# Patient Record
Sex: Female | Born: 1965 | Race: Black or African American | Hispanic: No | Marital: Single | Smoking: Current every day smoker
Health system: Southern US, Community
[De-identification: ages and names within clinical notes are randomized; demographics above are authoritative.]

## PROBLEM LIST (undated history)

## (undated) DIAGNOSIS — E7800 Pure hypercholesterolemia, unspecified: Secondary | ICD-10-CM

## (undated) DIAGNOSIS — E119 Type 2 diabetes mellitus without complications: Secondary | ICD-10-CM

## (undated) DIAGNOSIS — G629 Polyneuropathy, unspecified: Secondary | ICD-10-CM

## (undated) DIAGNOSIS — N189 Chronic kidney disease, unspecified: Secondary | ICD-10-CM

## (undated) DIAGNOSIS — F4310 Post-traumatic stress disorder, unspecified: Secondary | ICD-10-CM

## (undated) DIAGNOSIS — Z915 Personal history of self-harm: Secondary | ICD-10-CM

## (undated) DIAGNOSIS — R0600 Dyspnea, unspecified: Secondary | ICD-10-CM

## (undated) DIAGNOSIS — Z9151 Personal history of suicidal behavior: Secondary | ICD-10-CM

## (undated) DIAGNOSIS — I10 Essential (primary) hypertension: Secondary | ICD-10-CM

---

## 2015-07-03 DIAGNOSIS — F32A Depression, unspecified: Secondary | ICD-10-CM | POA: Insufficient documentation

## 2015-07-05 DIAGNOSIS — Z765 Malingerer [conscious simulation]: Secondary | ICD-10-CM | POA: Insufficient documentation

## 2015-07-22 ENCOUNTER — Encounter (HOSPITAL_BASED_OUTPATIENT_CLINIC_OR_DEPARTMENT_OTHER): Payer: Self-pay | Admitting: *Deleted

## 2015-07-22 ENCOUNTER — Emergency Department (HOSPITAL_BASED_OUTPATIENT_CLINIC_OR_DEPARTMENT_OTHER)
Admission: EM | Admit: 2015-07-22 | Discharge: 2015-07-22 | Disposition: A | Discharge status: Home / Self Care | Payer: Self-pay | Attending: Emergency Medicine | Admitting: Emergency Medicine

## 2015-07-22 ENCOUNTER — Emergency Department (HOSPITAL_BASED_OUTPATIENT_CLINIC_OR_DEPARTMENT_OTHER): Payer: Self-pay

## 2015-07-22 DIAGNOSIS — R059 Cough, unspecified: Secondary | ICD-10-CM

## 2015-07-22 DIAGNOSIS — E119 Type 2 diabetes mellitus without complications: Secondary | ICD-10-CM | POA: Insufficient documentation

## 2015-07-22 DIAGNOSIS — Z87891 Personal history of nicotine dependence: Secondary | ICD-10-CM | POA: Insufficient documentation

## 2015-07-22 DIAGNOSIS — R05 Cough: Secondary | ICD-10-CM | POA: Insufficient documentation

## 2015-07-22 DIAGNOSIS — I10 Essential (primary) hypertension: Secondary | ICD-10-CM | POA: Insufficient documentation

## 2015-07-22 DIAGNOSIS — Z79899 Other long term (current) drug therapy: Secondary | ICD-10-CM | POA: Insufficient documentation

## 2015-07-22 DIAGNOSIS — J3489 Other specified disorders of nose and nasal sinuses: Secondary | ICD-10-CM | POA: Insufficient documentation

## 2015-07-22 HISTORY — DX: Type 2 diabetes mellitus without complications: E11.9

## 2015-07-22 HISTORY — DX: Essential (primary) hypertension: I10

## 2015-07-22 MED ORDER — PSEUDOEPHEDRINE HCL ER 120 MG PO TB12: 120.0000 mg | ORAL_TABLET | Freq: Two times a day (BID) | ORAL | Status: DC

## 2015-07-22 NOTE — Discharge Instructions (Signed)
Upper Respiratory Infection, Adult An upper respiratory infection (URI) is also sometimes known as the common cold. The upper respiratory tract includes the nose, sinuses, throat, trachea, and bronchi. Bronchi are the airways leading to the lungs. Most people improve within 1 week, but symptoms can last up to 2 weeks. A residual cough may last even longer.  CAUSES Many different viruses can infect the tissues lining the upper respiratory tract. The tissues become irritated and inflamed and often become very moist. Mucus production is also common. A cold is contagious. You can easily spread the virus to others by oral contact. This includes kissing, sharing a glass, coughing, or sneezing. Touching your mouth or nose and then touching a surface, which is then touched by another person, can also spread the virus. SYMPTOMS  Symptoms typically develop 1 to 3 days after you come in contact with a cold virus. Symptoms vary from person to person. They may include:  Runny nose.  Sneezing.  Nasal congestion.  Sinus irritation.  Sore throat.  Loss of voice (laryngitis).  Cough.  Fatigue.  Muscle aches.  Loss of appetite.  Headache.  Low-grade fever. DIAGNOSIS  You might diagnose your own cold based on familiar symptoms, since most people get a cold 2 to 3 times a year. Your caregiver can confirm this based on your exam. Most importantly, your caregiver can check that your symptoms are not due to another disease such as strep throat, sinusitis, pneumonia, asthma, or epiglottitis. Blood tests, throat tests, and X-rays are not necessary to diagnose a common cold, but they may sometimes be helpful in excluding other more serious diseases. Your caregiver will decide if any further tests are required. RISKS AND COMPLICATIONS  You may be at risk for a more severe case of the common cold if you smoke cigarettes, have chronic heart disease (such as heart failure) or lung disease (such as asthma), or if  you have a weakened immune system. The very young and very old are also at risk for more serious infections. Bacterial sinusitis, middle ear infections, and bacterial pneumonia can complicate the common cold. The common cold can worsen asthma and chronic obstructive pulmonary disease (COPD). Sometimes, these complications can require emergency medical care and may be life-threatening. PREVENTION  The best way to protect against getting a cold is to practice good hygiene. Avoid oral or hand contact with people with cold symptoms. Wash your hands often if contact occurs. There is no clear evidence that vitamin C, vitamin E, echinacea, or exercise reduces the chance of developing a cold. However, it is always recommended to get plenty of rest and practice good nutrition. TREATMENT  Treatment is directed at relieving symptoms. There is no cure. Antibiotics are not effective, because the infection is caused by a virus, not by bacteria. Treatment may include:  Increased fluid intake. Sports drinks offer valuable electrolytes, sugars, and fluids.  Breathing heated mist or steam (vaporizer or shower).  Eating chicken soup or other clear broths, and maintaining good nutrition.  Getting plenty of rest.  Using gargles or lozenges for comfort.  Controlling fevers with ibuprofen or acetaminophen as directed by your caregiver.  Increasing usage of your inhaler if you have asthma. Zinc gel and zinc lozenges, taken in the first 24 hours of the common cold, can shorten the duration and lessen the severity of symptoms. Pain medicines may help with fever, muscle aches, and throat pain. A variety of non-prescription medicines are available to treat congestion and runny nose. Your caregiver   can make recommendations and may suggest nasal or lung inhalers for other symptoms.  HOME CARE INSTRUCTIONS   Only take over-the-counter or prescription medicines for pain, discomfort, or fever as directed by your  caregiver.  Use a warm mist humidifier or inhale steam from a shower to increase air moisture. This may keep secretions moist and make it easier to breathe.  Drink enough water and fluids to keep your urine clear or pale yellow.  Rest as needed.  Return to work when your temperature has returned to normal or as your caregiver advises. You may need to stay home longer to avoid infecting others. You can also use a face mask and careful hand washing to prevent spread of the virus. SEEK MEDICAL CARE IF:   After the first few days, you feel you are getting worse rather than better.  You need your caregiver's advice about medicines to control symptoms.  You develop chills, worsening shortness of breath, or brown or red sputum. These may be signs of pneumonia.  You develop yellow or brown nasal discharge or pain in the face, especially when you bend forward. These may be signs of sinusitis.  You develop a fever, swollen neck glands, pain with swallowing, or white areas in the back of your throat. These may be signs of strep throat. SEEK IMMEDIATE MEDICAL CARE IF:   You have a fever.  You develop severe or persistent headache, ear pain, sinus pain, or chest pain.  You develop wheezing, a prolonged cough, cough up blood, or have a change in your usual mucus (if you have chronic lung disease).  You develop sore muscles or a stiff neck. Document Released: 04/25/2001 Document Revised: 01/22/2012 Document Reviewed: 02/04/2014 Exit[employer] Patient Information 2015 [employer]. This information is not intended to replace advice given to you by your health care provider. Make sure you discuss any questions you have with your health care provider.  

## 2015-07-22 NOTE — ED Provider Notes (Signed)
CSN: [id number]     Arrival date & time 07/22/15  1942 History  This chart was scribed for Mirian Mo, MD by Doreatha Martin, ED Scribe. This patient was seen in room MHT13/MHT13 and the patient's care was started at 9:03 PM.     Chief Complaint  Patient presents with  . URI   Patient is a 49 y.o. female presenting with URI. The history is provided by the patient. No language interpreter was used.  URI Presenting symptoms: congestion, cough and rhinorrhea   Presenting symptoms: no fever   Congestion:    Location:  Nasal   Interferes with sleep: no     Interferes with eating/drinking: no   Cough:    Cough characteristics:  Productive   Sputum characteristics:  Yellow Associated symptoms: sinus pain   Risk factors: diabetes mellitus     HPI Comments: Thereasa Iannello is a 49 y.o. female with Hx of DM, HTN who presents to the Emergency Department complaining of congestion onset 2 days ago with associated rhinorrhea, productive cough with yellow phlegm. Pt states her current Sx feel similar to previous sinus infections. Pt is here from [facility] for alcohol abuse. Her last drink was 2 months ago. She denies fever, nausea, vomiting.  Past Medical History  Diagnosis Date  . Diabetes mellitus without complication   . Hypertension    History reviewed. No pertinent past surgical history. No family history on file. Social History  Substance Use Topics  . Smoking status: Former Games developer  . Smokeless tobacco: None  . Alcohol Use: Yes   OB History    No data available     Review of Systems  Constitutional: Negative for fever.  HENT: Positive for congestion and rhinorrhea.   Respiratory: Positive for cough.   Gastrointestinal: Negative for nausea and vomiting.  All other systems reviewed and are negative.  Allergies  Aspirin  Home Medications   Prior to Admission medications   Medication Sig Start Date End Date Taking? Authorizing Provider  gabapentin (NEURONTIN) 400 MG capsule Take  400 mg by mouth 3 (three) times daily.   Yes Historical Provider, MD  glipiZIDE (GLUCOTROL) 5 MG tablet Take by mouth daily before breakfast.   Yes Historical Provider, MD  haloperidol (HALDOL) 2 MG tablet Take 2 mg by mouth 2 (two) times daily.   Yes Historical Provider, MD  metFORMIN (GLUCOPHAGE) 1000 MG tablet Take 1,000 mg by mouth 2 (two) times daily with a meal.   Yes Historical Provider, MD  PARoxetine (PAXIL) 30 MG tablet Take 30 mg by mouth daily.   Yes Historical Provider, MD  quinapril-hydrochlorothiazide (ACCURETIC) 20-25 MG per tablet Take 1 tablet by mouth daily.   Yes Historical Provider, MD  sitaGLIPtin (JANUVIA) 100 MG tablet Take 100 mg by mouth daily.   Yes Historical Provider, MD  traZODone (DESYREL) 100 MG tablet Take 100 mg by mouth at bedtime.   Yes Historical Provider, MD  pseudoephedrine (SUDAFED 12 HOUR) 120 MG 12 hr tablet Take 1 tablet (120 mg total) by mouth 2 (two) times daily. 07/22/15   Mirian Mo, MD   BP 149/101 mmHg  Pulse 84  Temp(Src) 98.3 F (36.8 C) (Oral)  Resp 22  Ht 5\' 3"  (1.6 m)  Wt 294 lb (133.358 kg)  BMI 52.09 kg/m2  SpO2 100% Physical Exam  Constitutional: She is oriented to person, place, and time. She appears well-developed and well-nourished.  HENT:  Head: Normocephalic and atraumatic.  Right Ear: External ear normal.  Left Ear: External  ear normal.  Diffuse sinus tenderness  Eyes: Conjunctivae and EOM are normal. Pupils are equal, round, and reactive to light.  Neck: Normal range of motion. Neck supple.  Cardiovascular: Normal rate, regular rhythm, normal heart sounds and intact distal pulses.   Pulmonary/Chest: Effort normal and breath sounds normal.  Abdominal: Soft. Bowel sounds are normal. There is no tenderness.  Musculoskeletal: Normal range of motion.  Neurological: She is alert and oriented to person, place, and time.  Skin: Skin is warm and dry.  Vitals reviewed.   ED Course  Procedures (including critical care  time) DIAGNOSTIC STUDIES: Oxygen Saturation is 98% on RA, normal by my interpretation.    COORDINATION OF CARE: 9:05 PM Discussed treatment plan with pt at bedside and pt agreed to plan.   Labs Review Labs Reviewed - No data to display  Imaging Review Dg Chest 2 View  07/22/2015   CLINICAL DATA:  Two day history of cough and congestion  EXAM: CHEST  2 VIEW  COMPARISON:  None.  FINDINGS: Lungs are clear. Heart size and pulmonary vascularity are normal. No adenopathy. No bone lesions.  IMPRESSION: No abnormality noted.   Electronically Signed   By: Bretta Bang III M.D.   On: 07/22/2015 22:02   I have personally reviewed and evaluated these images and lab results as part of my medical decision-making.   EKG Interpretation None      MDM   Final diagnoses:  Cough  Sinus pain    49 y.o. female with pertinent PMH of prior sinusitis, DM, HTN presents with recurrent cough, nasal congestion, facial pain.  No fevers.  Exam with generalized sinus tenderness.  Symptoms brief, do not feel that she necessitates acute abx course at this time.  CXR negative.  DC home in stable condition.    I have reviewed all laboratory and imaging studies if ordered as above  1. Cough   2. Sinus pain          Mirian Mo, MD 07/23/15 [phone]

## 2015-07-22 NOTE — ED Notes (Signed)
States she thinks she has a sinus infection. From [facility].

## 2015-07-22 NOTE — ED Notes (Signed)
Back from xray, lying flat on R side, NAD, calm, arousable to voice, alert, no dyspnea noted, pending xray results.

## 2015-08-19 ENCOUNTER — Encounter (HOSPITAL_COMMUNITY): Payer: Self-pay | Admitting: Emergency Medicine

## 2015-08-19 ENCOUNTER — Emergency Department (HOSPITAL_COMMUNITY)
Admission: EM | Admit: 2015-08-19 | Discharge: 2015-08-20 | Disposition: A | Discharge status: Other Acute Inpatient Hospital | Payer: Self-pay | Attending: Emergency Medicine | Admitting: Emergency Medicine

## 2015-08-19 DIAGNOSIS — F333 Major depressive disorder, recurrent, severe with psychotic symptoms: Secondary | ICD-10-CM | POA: Insufficient documentation

## 2015-08-19 DIAGNOSIS — F4310 Post-traumatic stress disorder, unspecified: Secondary | ICD-10-CM | POA: Insufficient documentation

## 2015-08-19 DIAGNOSIS — I10 Essential (primary) hypertension: Secondary | ICD-10-CM | POA: Insufficient documentation

## 2015-08-19 DIAGNOSIS — E119 Type 2 diabetes mellitus without complications: Secondary | ICD-10-CM | POA: Insufficient documentation

## 2015-08-19 DIAGNOSIS — Z79899 Other long term (current) drug therapy: Secondary | ICD-10-CM | POA: Insufficient documentation

## 2015-08-19 DIAGNOSIS — Z87891 Personal history of nicotine dependence: Secondary | ICD-10-CM | POA: Insufficient documentation

## 2015-08-19 LAB — COMPREHENSIVE METABOLIC PANEL
ALT: 14 U/L (ref 14–54)
AST: 19 U/L (ref 15–41)
Albumin: 3.5 g/dL (ref 3.5–5.0)
Alkaline Phosphatase: 82 U/L (ref 38–126)
Anion gap: 11 (ref 5–15)
BUN: 17 mg/dL (ref 6–20)
CHLORIDE: 100 mmol/L — AB (ref 101–111)
CO2: 25 mmol/L (ref 22–32)
CREATININE: 0.97 mg/dL (ref 0.44–1.00)
Calcium: 9.2 mg/dL (ref 8.9–10.3)
Glucose, Bld: 121 mg/dL — ABNORMAL HIGH (ref 65–99)
POTASSIUM: 4.5 mmol/L (ref 3.5–5.1)
Sodium: 136 mmol/L (ref 135–145)
TOTAL PROTEIN: 7.3 g/dL (ref 6.5–8.1)
Total Bilirubin: 0.4 mg/dL (ref 0.3–1.2)

## 2015-08-19 LAB — RAPID URINE DRUG SCREEN, HOSP PERFORMED
AMPHETAMINES: NOT DETECTED
Barbiturates: NOT DETECTED
Benzodiazepines: NOT DETECTED
Cocaine: NOT DETECTED
OPIATES: NOT DETECTED
Tetrahydrocannabinol: NOT DETECTED

## 2015-08-19 LAB — CBC
HCT: 31.7 % — ABNORMAL LOW (ref 36.0–46.0)
Hemoglobin: 10.2 g/dL — ABNORMAL LOW (ref 12.0–15.0)
MCH: 28.9 pg (ref 26.0–34.0)
MCHC: 32.2 g/dL (ref 30.0–36.0)
MCV: 89.8 fL (ref 78.0–100.0)
PLATELETS: 398 10*3/uL (ref 150–400)
RBC: 3.53 MIL/uL — AB (ref 3.87–5.11)
RDW: 12.9 % (ref 11.5–15.5)
WBC: 7.2 10*3/uL (ref 4.0–10.5)

## 2015-08-19 LAB — ETHANOL

## 2015-08-19 LAB — ACETAMINOPHEN LEVEL: Acetaminophen (Tylenol), Serum: <10 ug/mL — ABNORMAL LOW (ref 10–30)

## 2015-08-19 LAB — SALICYLATE LEVEL

## 2015-08-19 MED ORDER — LISINOPRIL 20 MG PO TABS: 20.0000 mg | ORAL_TABLET | Freq: Every day | ORAL | Status: DC

## 2015-08-19 MED ORDER — HYDROCHLOROTHIAZIDE 25 MG PO TABS: 25.0000 mg | ORAL_TABLET | Freq: Every day | ORAL | Status: DC

## 2015-08-19 MED ORDER — GABAPENTIN 400 MG PO CAPS
400.0000 mg | ORAL_CAPSULE | Freq: Three times a day (TID) | ORAL | Status: DC
  Administered 2015-08-19: 400 mg via ORAL
  Filled 2015-08-19: qty 1

## 2015-08-19 MED ORDER — GLIPIZIDE 5 MG PO TABS
5.0000 mg | ORAL_TABLET | Freq: Every day | ORAL | Status: DC
  Filled 2015-08-19: qty 1

## 2015-08-19 MED ORDER — PAROXETINE HCL 30 MG PO TABS: 30.0000 mg | ORAL_TABLET | Freq: Every day | ORAL | Status: DC

## 2015-08-19 MED ORDER — LINAGLIPTIN 5 MG PO TABS: 5.0000 mg | ORAL_TABLET | Freq: Every day | ORAL | Status: DC

## 2015-08-19 MED ORDER — TRAZODONE HCL 100 MG PO TABS
100.0000 mg | ORAL_TABLET | Freq: Every day | ORAL | Status: DC
  Administered 2015-08-19: 100 mg via ORAL
  Filled 2015-08-19: qty 1

## 2015-08-19 MED ORDER — HALOPERIDOL 2 MG PO TABS
2.0000 mg | ORAL_TABLET | Freq: Two times a day (BID) | ORAL | Status: DC
  Administered 2015-08-19: 2 mg via ORAL
  Filled 2015-08-19: qty 1

## 2015-08-19 MED ORDER — METFORMIN HCL 500 MG PO TABS: 1000.0000 mg | ORAL_TABLET | Freq: Two times a day (BID) | ORAL | Status: DC

## 2015-08-19 MED ORDER — QUINAPRIL-HYDROCHLOROTHIAZIDE 20-25 MG PO TABS: 1.0000 | ORAL_TABLET | Freq: Every day | ORAL | Status: DC

## 2015-08-19 MED ORDER — LORAZEPAM 1 MG PO TABS: 1.0000 mg | ORAL_TABLET | ORAL | Status: DC | PRN

## 2015-08-19 NOTE — ED Notes (Signed)
Security called to wand patient and Staffing called for sitter

## 2015-08-19 NOTE — ED Provider Notes (Signed)
CSN: [id number]     Arrival date & time 08/19/15  1610 History   First MD Initiated Contact with Patient 08/19/15 1630     Chief Complaint  Patient presents with  . Suicidal     (Consider location/radiation/quality/duration/timing/severity/associated sxs/prior Treatment) Patient is a 49 y.o. female presenting with mental health disorder.  Mental Health Problem Presenting symptoms: suicidal thoughts   Presenting symptoms: no suicide attempt   Onset quality:  Gradual Timing:  Constant Progression:  Worsening Chronicity:  Recurrent Context: alcohol use (in [facility] for rehab)   Treatment compliance:  Untreated Relieved by:  Nothing Worsened by:  Nothing tried Ineffective treatments:  None tried Associated symptoms: no abdominal pain and no anxiety     Past Medical History  Diagnosis Date  . Diabetes mellitus without complication (HCC)   . Hypertension   . PTSD (post-traumatic stress disorder)    History reviewed. No pertinent past surgical history. No family history on file. Social History  Substance Use Topics  . Smoking status: Former Games developer  . Smokeless tobacco: None  . Alcohol Use: Yes     Comment: etoh daily- 24 pack a day and 1/5 of gin daily. sober for 38 days   OB History    No data available     Review of Systems  Gastrointestinal: Negative for abdominal pain.  Psychiatric/Behavioral: Positive for suicidal ideas. The patient is not nervous/anxious.   All other systems reviewed and are negative.     Allergies  Aspirin  Home Medications   Prior to Admission medications   Medication Sig Start Date End Date Taking? Authorizing Provider  gabapentin (NEURONTIN) 400 MG capsule Take 400 mg by mouth 3 (three) times daily.   Yes Historical Provider, MD  glipiZIDE (GLUCOTROL) 5 MG tablet Take by mouth 2 (two) times daily before a meal. Before breakfast and dinner   Yes Historical Provider, MD  metFORMIN (GLUCOPHAGE) 1000 MG tablet Take 1,000 mg by mouth 2 (two)  times daily with a meal.   Yes Historical Provider, MD  nicotine polacrilex (NICORETTE) 2 MG gum Take 2 mg by mouth daily as needed for smoking cessation.   Yes Historical Provider, MD  quinapril-hydrochlorothiazide (ACCURETIC) 20-25 MG per tablet Take 1 tablet by mouth daily.   Yes Historical Provider, MD  sitaGLIPtin (JANUVIA) 100 MG tablet Take 100 mg by mouth every evening.    Yes Historical Provider, MD  traZODone (DESYREL) 100 MG tablet Take 100 mg by mouth at bedtime.   Yes Historical Provider, MD  haloperidol (HALDOL) 2 MG tablet Take 2 mg by mouth 2 (two) times daily.    Historical Provider, MD  PARoxetine (PAXIL) 30 MG tablet Take 30 mg by mouth daily.    Historical Provider, MD  pseudoephedrine (SUDAFED 12 HOUR) 120 MG 12 hr tablet Take 1 tablet (120 mg total) by mouth 2 (two) times daily. Patient not taking: Reported on 08/19/2015 07/22/15   Mirian Mo, MD   BP 103/53 mmHg  Pulse 86  Temp(Src) 98 F (36.7 C) (Oral)  Resp 16  Wt 307 lb (139.254 kg)  SpO2 99%  LMP 07/21/2015 Physical Exam  Constitutional: She is oriented to person, place, and time. She appears well-developed and well-nourished.  HENT:  Head: Normocephalic and atraumatic.  Right Ear: External ear normal.  Left Ear: External ear normal.  Eyes: Conjunctivae and EOM are normal. Pupils are equal, round, and reactive to light.  Neck: Normal range of motion. Neck supple.  Cardiovascular: Normal rate, regular rhythm, normal heart  sounds and intact distal pulses.   Pulmonary/Chest: Effort normal and breath sounds normal.  Abdominal: Soft. Bowel sounds are normal. There is no tenderness.  Musculoskeletal: Normal range of motion.  Neurological: She is alert and oriented to person, place, and time.  Skin: Skin is warm and dry.  Vitals reviewed.   ED Course  Procedures (including critical care time) Labs Review Labs Reviewed  COMPREHENSIVE METABOLIC PANEL - Abnormal; Notable for the following:    Chloride 100  (*)    Glucose, Bld 121 (*)    All other components within normal limits  ACETAMINOPHEN LEVEL - Abnormal; Notable for the following:    Acetaminophen (Tylenol), Serum <10 (*)    All other components within normal limits  CBC - Abnormal; Notable for the following:    RBC 3.53 (*)    Hemoglobin 10.2 (*)    HCT 31.7 (*)    All other components within normal limits  ETHANOL  SALICYLATE LEVEL  URINE RAPID DRUG SCREEN, HOSP PERFORMED  POC URINE PREG, ED    Imaging Review No results found. I have personally reviewed and evaluated these images and lab results as part of my medical decision-making.   EKG Interpretation None      MDM   Final diagnoses:  None    49 y.o. female with pertinent PMH of etoh abuse currently in [facility] presents with SI with plan to cut wrist.  Exam benign.  Consulted psych.    I have reviewed all laboratory and imaging studies if ordered as above  SI    Mirian Mo, MD 08/21/15 [phone]

## 2015-08-19 NOTE — ED Notes (Signed)
Pt brought to ED from [facility] by an employee.  Pt st's she has been at [facility] for 38 days for drug and alcohol rehab.  St's she started feeling suicidal yesterday with a plan to cut herself with a razor if she had one.  Pt was recently taken off 2 of her antidepressant meds.

## 2015-08-19 NOTE — ED Notes (Signed)
Staffing office called and made aware of need for sitter
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
## 2015-08-19 NOTE — ED Notes (Addendum)
Hailey Klein from [name] would like to be called when plan of care is determined.  [phone]

## 2015-08-19 NOTE — ED Notes (Signed)
Pt was wanded by security.  

## 2015-08-20 ENCOUNTER — Encounter (HOSPITAL_COMMUNITY): Payer: Self-pay

## 2015-08-20 ENCOUNTER — Inpatient Hospital Stay (HOSPITAL_COMMUNITY)
Admission: EM | Admit: 2015-08-20 | Discharge: 2015-08-25 | DRG: 885 | Disposition: A | Discharge status: Home / Self Care | Payer: Federal, State, Local not specified - Other | Source: Intra-hospital | Attending: Psychiatry | Admitting: Psychiatry

## 2015-08-20 DIAGNOSIS — F4310 Post-traumatic stress disorder, unspecified: Secondary | ICD-10-CM | POA: Diagnosis present

## 2015-08-20 DIAGNOSIS — Z87891 Personal history of nicotine dependence: Secondary | ICD-10-CM

## 2015-08-20 DIAGNOSIS — G4700 Insomnia, unspecified: Secondary | ICD-10-CM | POA: Diagnosis present

## 2015-08-20 DIAGNOSIS — E119 Type 2 diabetes mellitus without complications: Secondary | ICD-10-CM | POA: Diagnosis present

## 2015-08-20 DIAGNOSIS — Z809 Family history of malignant neoplasm, unspecified: Secondary | ICD-10-CM | POA: Diagnosis not present

## 2015-08-20 DIAGNOSIS — F333 Major depressive disorder, recurrent, severe with psychotic symptoms: Secondary | ICD-10-CM | POA: Diagnosis present

## 2015-08-20 DIAGNOSIS — F4001 Agoraphobia with panic disorder: Secondary | ICD-10-CM | POA: Diagnosis present

## 2015-08-20 DIAGNOSIS — R45851 Suicidal ideations: Secondary | ICD-10-CM | POA: Diagnosis present

## 2015-08-20 DIAGNOSIS — F339 Major depressive disorder, recurrent, unspecified: Secondary | ICD-10-CM | POA: Diagnosis present

## 2015-08-20 DIAGNOSIS — Z590 Homelessness: Secondary | ICD-10-CM | POA: Diagnosis not present

## 2015-08-20 DIAGNOSIS — I10 Essential (primary) hypertension: Secondary | ICD-10-CM | POA: Diagnosis present

## 2015-08-20 HISTORY — DX: Post-traumatic stress disorder, unspecified: F43.10

## 2015-08-20 LAB — GLUCOSE, CAPILLARY
GLUCOSE-CAPILLARY: 117 mg/dL — AB (ref 65–99)
GLUCOSE-CAPILLARY: 177 mg/dL — AB (ref 65–99)
GLUCOSE-CAPILLARY: 237 mg/dL — AB (ref 65–99)
GLUCOSE-CAPILLARY: 179 mg/dL — AB (ref 65–99)

## 2015-08-20 MED ORDER — METFORMIN HCL 500 MG PO TABS
1000.0000 mg | ORAL_TABLET | Freq: Two times a day (BID) | ORAL | Status: DC
  Administered 2015-08-20 – 2015-08-25 (×11): 1000 mg via ORAL
  Filled 2015-08-20 (×5): qty 2
  Filled 2015-08-20: qty 28
  Filled 2015-08-20 (×4): qty 2
  Filled 2015-08-20: qty 28
  Filled 2015-08-20 (×4): qty 2

## 2015-08-20 MED ORDER — TRAZODONE HCL 50 MG PO TABS
50.0000 mg | ORAL_TABLET | Freq: Every evening | ORAL | Status: DC | PRN
  Administered 2015-08-20 – 2015-08-21 (×2): 50 mg via ORAL
  Filled 2015-08-20 (×3): qty 1

## 2015-08-20 MED ORDER — MAGNESIUM HYDROXIDE 400 MG/5ML PO SUSP: 30.0000 mL | Freq: Every day | ORAL | Status: DC | PRN

## 2015-08-20 MED ORDER — LISINOPRIL 20 MG PO TABS
20.0000 mg | ORAL_TABLET | Freq: Every day | ORAL | Status: DC
  Administered 2015-08-20 – 2015-08-25 (×6): 20 mg via ORAL
  Filled 2015-08-20 (×5): qty 1
  Filled 2015-08-20: qty 7
  Filled 2015-08-20 (×2): qty 1

## 2015-08-20 MED ORDER — TRAZODONE HCL 100 MG PO TABS
100.0000 mg | ORAL_TABLET | Freq: Every day | ORAL | Status: DC
  Filled 2015-08-20 (×2): qty 1

## 2015-08-20 MED ORDER — INFLUENZA VAC SPLIT QUAD 0.5 ML IM SUSY
0.5000 mL | PREFILLED_SYRINGE | INTRAMUSCULAR | Status: AC
  Administered 2015-08-21: 0.5 mL via INTRAMUSCULAR
  Filled 2015-08-20: qty 0.5

## 2015-08-20 MED ORDER — GLIPIZIDE 5 MG PO TABS
5.0000 mg | ORAL_TABLET | Freq: Two times a day (BID) | ORAL | Status: DC
  Administered 2015-08-20 – 2015-08-25 (×11): 5 mg via ORAL
  Filled 2015-08-20 (×11): qty 1
  Filled 2015-08-20: qty 14
  Filled 2015-08-20 (×2): qty 1
  Filled 2015-08-20: qty 14
  Filled 2015-08-20: qty 1

## 2015-08-20 MED ORDER — QUINAPRIL-HYDROCHLOROTHIAZIDE 20-25 MG PO TABS: 1.0000 | ORAL_TABLET | Freq: Every day | ORAL | Status: DC

## 2015-08-20 MED ORDER — DULOXETINE HCL 30 MG PO CPEP
30.0000 mg | ORAL_CAPSULE | Freq: Every day | ORAL | Status: DC
  Administered 2015-08-21 – 2015-08-23 (×3): 30 mg via ORAL
  Filled 2015-08-20 (×5): qty 1

## 2015-08-20 MED ORDER — RISPERIDONE 2 MG PO TABS
2.0000 mg | ORAL_TABLET | Freq: Every day | ORAL | Status: DC
  Administered 2015-08-20 – 2015-08-24 (×5): 2 mg via ORAL
  Filled 2015-08-20 (×4): qty 1
  Filled 2015-08-20: qty 7
  Filled 2015-08-20 (×2): qty 1

## 2015-08-20 MED ORDER — ACETAMINOPHEN 325 MG PO TABS
650.0000 mg | ORAL_TABLET | Freq: Four times a day (QID) | ORAL | Status: DC | PRN
  Administered 2015-08-20 – 2015-08-25 (×6): 650 mg via ORAL
  Filled 2015-08-20 (×6): qty 2

## 2015-08-20 MED ORDER — ALUM & MAG HYDROXIDE-SIMETH 200-200-20 MG/5ML PO SUSP: 30.0000 mL | ORAL | Status: DC | PRN

## 2015-08-20 MED ORDER — GABAPENTIN 400 MG PO CAPS
400.0000 mg | ORAL_CAPSULE | Freq: Three times a day (TID) | ORAL | Status: DC
  Administered 2015-08-20 – 2015-08-25 (×17): 400 mg via ORAL
  Filled 2015-08-20: qty 21
  Filled 2015-08-20 (×6): qty 1
  Filled 2015-08-20: qty 21
  Filled 2015-08-20 (×11): qty 1
  Filled 2015-08-20: qty 21
  Filled 2015-08-20 (×2): qty 1

## 2015-08-20 MED ORDER — PNEUMOCOCCAL VAC POLYVALENT 25 MCG/0.5ML IJ INJ
0.5000 mL | INJECTION | INTRAMUSCULAR | Status: AC
  Administered 2015-08-21: 0.5 mL via INTRAMUSCULAR

## 2015-08-20 MED ORDER — HYDROCHLOROTHIAZIDE 25 MG PO TABS
25.0000 mg | ORAL_TABLET | Freq: Every day | ORAL | Status: DC
  Administered 2015-08-20 – 2015-08-25 (×6): 25 mg via ORAL
  Filled 2015-08-20 (×10): qty 1

## 2015-08-20 MED ORDER — PAROXETINE HCL 10 MG PO TABS
30.0000 mg | ORAL_TABLET | Freq: Every day | ORAL | Status: DC
  Administered 2015-08-20: 30 mg via ORAL
  Filled 2015-08-20: qty 1
  Filled 2015-08-20: qty 3
  Filled 2015-08-20: qty 1

## 2015-08-20 NOTE — ED Notes (Signed)
Called [facility] to inform that pt was transferred to [facility]

## 2015-08-20 NOTE — Tx Team (Signed)
Initial Interdisciplinary Treatment Plan   PATIENT STRESSORS: Marital or family conflict Substance abuse   PATIENT STRENGTHS: General fund of knowledge Motivation for treatment/growth Supportive family/friends   PROBLEM LIST: Problem List/Patient Goals Date to be addressed Date deferred Reason deferred Estimated date of resolution  "SI" 10/7     "PTSD"      "to get better"                                           DISCHARGE CRITERIA:  Improved stabilization in mood, thinking, and/or behavior Medical problems require only outpatient monitoring Motivation to continue treatment in a less acute level of care Reduction of life-threatening or endangering symptoms to within safe limits  PRELIMINARY DISCHARGE PLAN: Attend 12-step recovery group Outpatient therapy  PATIENT/FAMIILY INVOLVEMENT: This treatment plan has been presented to and reviewed with the patient, Hailey Klein.  The patient and family have been given the opportunity to ask questions and make suggestions.  Hailey Klein A 08/20/2015, 7:32 AM

## 2015-08-20 NOTE — [facility] Group Notes (Signed)
[facility] LCSW Aftercare Discharge Planning Group Note  08/20/2015 8:45 AM  Participation Quality: Alert, Appropriate and Oriented  Mood/Affect: Flat and Depressed  Depression Rating: 10  Anxiety Rating: 10  Thoughts of Suicide: Pt endorses passive SI  Will you contract for safety? Yes  Current AVH: Pt denies  Plan for Discharge/Comments: Pt attended discharge planning group and actively participated in group. CSW discussed suicide prevention education with the group and encouraged them to discuss discharge planning and any relevant barriers. Pt reports "not feeling very good." Is flat and withdrawn but cooperative. Reports she is followed by Dr. Leonard Schwartz at [facility] in [place]: Pt reports access to transportation  Supports: No supports mentioned at this time  Chad Cordial, LCSWA 08/20/2015 9:42 AM
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
## 2015-08-20 NOTE — Progress Notes (Signed)
Hailey Klein has spent most of her day..in her bed...sleeping. She takes her scheduled meds as ordered and she is quiet as she is seen around the unti.     A She completes her daily assessment  And on it she wrote she deneid SI and she rated her depression, hopelessness and anxiety " 10/10/8" respectively. She is scheduled to receive resperdal fro c/o racing thoughts and her paxil is dc'Hailey per MD. Cymbalta to be intitiated.    R Safety in place.

## 2015-08-20 NOTE — Progress Notes (Deleted)
Hailey Klein is seen OOB UAL on the 400 hall today...she tolerates this fair. She is sad, depressed and quiet. She looks down at the floor. She does not use a lot of words when she speaks. She has attended her groups today and she rates her depression, anxiety and hopelessness  " 10/10/8", respectively.    A She has been asking ? Begging / worrying about " whatever meds changes Dr. Jama Flavors will make in her chart today. She is encouraged not to expect opiates and states " he knows what i want...."     R She semonstrates little to minimal insight into her rcovery and is encouraged to open her heart..influenza that she may understand she needs the nmessage...influenza order to be POC cont.healtheir.

## 2015-08-20 NOTE — Progress Notes (Signed)
Recreation Therapy Notes  Date: 10.07.2016 Time: 9:30am Location: 300 Hall Group room   Group Topic: Stress Management  Goal Area(s) Addresses:  Patient will actively participate in stress management techniques presented during session.   Behavioral Response: Appropriate   Intervention: Stress management techniques  Activity :  Deep Breathing and Guided Imagery. LRT provided instruction and demonstration on practice of Guided Imagery. Technique was coupled with deep breathing.   Education:  Stress Management, Discharge Planning.   Education Outcome: Acknowledges education  Clinical Observations/Feedback: Patient actively engaged in technique introduced, expressed no concerns and demonstrated ability to practice independently post d/c.  Marykay Lex Alverna Fawley, LRT/CTRS  Yaileen Hofferber L 08/20/2015 10:14 AM

## 2015-08-20 NOTE — BHH Suicide Risk Assessment (Signed)
[facility] Admission Suicide Risk Assessment   Nursing information obtained from:   patient and chart  Demographic factors:   49 year old female, homeless , currently unemployed  Current Mental Status:   see below  Loss Factors:   severe psychosocial stressors  Historical Factors:   Depression, auditory hallucinations , Alcohol/Drug Abuse ( sober x 1 month )  Risk Reduction Factors:   resilience  Total Time spent with patient: 45 minutes Principal Problem:  Major Depression, Recurrent , Severe ,  with psychotic features Diagnosis:   Patient Active Problem List   Diagnosis Date Noted  . Major depressive disorder, recurrent (HCC) [F33.9] 08/20/2015     Continued Clinical Symptoms:  Alcohol Use Disorder Identification Test Final Score (AUDIT): 25 The "Alcohol Use Disorders Identification Test", Guidelines for Use in Primary Care, Second Edition.  World Science writer [facility]). Score between 0-7:  no or low risk or alcohol related problems. Score between 8-15:  moderate risk of alcohol related problems. Score between 16-19:  high risk of alcohol related problems. Score 20 or above:  warrants further diagnostic evaluation for alcohol dependence and treatment.   CLINICAL FACTORS:  49 year old female, history of depression, history of alcohol dependence, substance abuse- was at [facility] until recently. States has not used alcohol or drugs x 30 + days . Describes worsening depression, with emergence of auditory hallucinations and suicidal ideations, leading to admission     Psychiatric Specialty Exam: Physical Exam  ROS  Blood pressure 98/48, pulse 75, temperature 97.8 F (36.6 C), temperature source Oral, resp. rate 20, height  (1.676 m), weight 301 lb (136.533 kg), last menstrual period 07/21/2015.Body mass index is 48.61 kg/(m^2).   See admit note MSE                                                        COGNITIVE FEATURES THAT CONTRIBUTE TO  RISK:  Closed-mindedness and Loss of executive function    SUICIDE RISK:   Moderate:  Frequent suicidal ideation with limited intensity, and duration, some specificity in terms of plans, no associated intent, good self-control, limited dysphoria/symptomatology, some risk factors present, and identifiable protective factors, including available and accessible social support.  PLAN OF CARE: Patient will be admitted to inpatient psychiatric unit for stabilization and safety. Will provide and encourage milieu participation. Provide medication management and maked adjustments as needed.  Will follow daily.    Medical Decision Making:  Review of Psycho-Social Stressors (1), Review or order clinical lab tests (1), Established Problem, Worsening (2) and Review of Medication Regimen & Side Effects (2)  I certify that inpatient services furnished can reasonably be expected to improve the patient's condition.   COBOS, FERNANDO 08/20/2015, 3:50 PM
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
## 2015-08-20 NOTE — Progress Notes (Signed)
Pt did not attend wrap up group this evening.  

## 2015-08-20 NOTE — BHH Group Notes (Signed)
BHH LCSW Group Therapy 08/20/2015 1:15pm  Type of Therapy: Group Therapy- Feelings Around Relapse and Recovery  Participation Level: Pt slept throughout group discussion.  Chad Cordial, LCSWA Clinical Social Work [phone]
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
## 2015-08-21 LAB — LIPID PANEL
CHOL/HDL RATIO: 3.9 ratio
CHOLESTEROL: 171 mg/dL (ref 0–200)
HDL: 44 mg/dL (ref >40)
LDL Cholesterol: 98 mg/dL (ref 0–99)
Triglycerides: 144 mg/dL (ref <150)
VLDL: 29 mg/dL (ref 0–40)

## 2015-08-21 LAB — GLUCOSE, CAPILLARY
GLUCOSE-CAPILLARY: 87 mg/dL (ref 65–99)
GLUCOSE-CAPILLARY: 137 mg/dL — AB (ref 65–99)
Glucose-Capillary: 102 mg/dL — ABNORMAL HIGH (ref 65–99)

## 2015-08-21 LAB — TSH: TSH: 3.666 u[IU]/mL (ref 0.350–4.500)

## 2015-08-21 MED ORDER — PENICILLIN G BENZATHINE 1200000 UNIT/2ML IM SUSP
2.4000 10*6.[IU] | Freq: Once | INTRAMUSCULAR | Status: AC
  Administered 2015-08-21: 2.4 10*6.[IU] via INTRAMUSCULAR
  Filled 2015-08-21: qty 4

## 2015-08-21 NOTE — Plan of Care (Signed)
Problem: Diagnosis: Increased Risk For Suicide Attempt Goal: STG-Patient Will Comply With Medication Regime Outcome: Progressing Client has been compliant with medication regime AEB taking medications as scheduled, no adverse reactions reported.

## 2015-08-21 NOTE — Progress Notes (Signed)
.  Psychoeducational Group Note    Date: 08/21/2015 Time:  0930    Goal Setting Purpose of Group: To be able to set a goal that is measurable and that can be accomplished in one day Participation Level:  Active  Participation Quality:  Appropriate  Affect:  Appropriate  Cognitive:  Oriented  Insight:  Improving  Engagement in Group:  Engaged  Additional Comments:  Pt attended the group and participated.  Taysom Glymph A 

## 2015-08-21 NOTE — [facility] Group Notes (Signed)
[facility] Group Notes:  (Clinical Social Work)  08/21/2015     1:15-2:15PM  Summary of Progress/Problems:   The main focus of today's process group was to learn how to use a decisional balance exercise to move forward in the Stages of Change.  Patients listed needs on the whiteboard and unhealthy coping techniques often used to fill needs.  Motivational Interviewing and the whiteboard were utilized to help patients explore in depth the perceived benefits and costs of unhealthy coping techniques, as well as the  benefits and costs of replacing that with a healthy coping skills.   The patient expressed very little in group and was sleeping through part of it.  We discussed coping techniques to "live in the moment" , i.e., mindfulness, and CSW gave examples and homework.  We also talked at length about filtering negative self-talk and how to deal with people making [place] angry, and emphasis was placed on finding a therapist to pursue the learning of these skills outside of hospital.  Type of Therapy:  Group Therapy - Process   Participation Level:  Minimal  Participation Quality:  Drowsy  Affect:  Blunted  Cognitive:  Appropriate  Insight:  Improving  Engagement in Therapy:  Improving  Modes of Intervention:  Education, Motivational Interviewing  Ambrose Mantle, LCSW 08/21/2015, 4:02 PM

## 2015-08-21 NOTE — Progress Notes (Signed)
[name] is very quiet today. She takes her morning medications and avoids making eye contact with this Clinical research associate. She takes all meds as planned and she completes her morning assessment and on it she writes she denies SI and she rates her depression, hopelessness and anxiety " 4/1/1" respectively.   A She attends her Life SKills group, is engaged in the calss...processsing how she can practice healthier coping skills.   R Safety is in place and poc cont.

## 2015-08-21 NOTE — Progress Notes (Signed)
Patient ID: Hailey Klein, female   DOB: [birth year], 49 y.o.   MRN: [id number] D: Client sitting in dayroom watching TV this evening, reports "I talked to NP today, got my flu/pneumonia shot and my PCN shot today, and oh had an EKG today, it was a busy day" Client reports depression as "3" of 10, denies SI. Client reports group was helpful "focus on concentrating on I instead of we" Client also talked about contracting STD, "I let my guard down, I really felt like I knew this person"  A: Writer provided emotional support, encouraged client to continue medications and group therapy verbalizing any concerns. Client encouraged to always practice safe sex.  Staff will monitor q51min for safety. R:Client is safe on the unit, attended group.

## 2015-08-21 NOTE — Progress Notes (Signed)
Pt did not attend wrap up group this evening.  

## 2015-08-21 NOTE — Progress Notes (Signed)
[facility] MD Progress Note  08/21/2015 2:34 PM Hailey Klein  MRN:  [id number] Subjective:  49 year old female, presented to the ED because she had been feeling progressively more depressed . Hx of depression. States " I was doing fine , but over the last week or so I started feeling more depressed, sad, and I became suicidal ". She also states that over the last two days she has developed auditory hallucinations, hearing mother 's voice insulting her , demeaning her . This led for her to seek admission. She states she started thinking of cutting her wrists, but did not act out . She cannot identify and specific/ acute stressors that might be triggering worsening depression.  She has a history of alcohol dependence, and had been at [facility] up to recently . States she has been sober for more than a month now .  Endorses neuro-vegetative symptoms of depression as below.  Of note, states she has had some recent medication / doses changed. Haldol and Elavil were stopped several weeks ago, as per her report .  Patient  has been compliant with his medication and inpatient psychiatric program including milieu therapy and group therapy. Patient  has a disturbance of sleep and appetite. Patient rates depression  3/10 hopelessness 3/10 and anxiety 3/10 and denies SI/HI/AVH. Patient denies any stressors at this time.  Patient stated that he is feeling safer in the hospital contract for safety while in the hospital.  Principal Problem: MDD (major depressive disorder), recurrent, severe, with psychosis (HCC) Diagnosis:   Patient Active Problem List   Diagnosis Date Noted  . Major depressive disorder, recurrent (HCC) [F33.9] 08/20/2015  . MDD (major depressive disorder), recurrent, severe, with psychosis (HCC) [F33.3] 08/20/2015   Total Time spent with patient: 30 minutes  Past Psychiatric History: MDD  Past Medical History:  Past Medical History  Diagnosis Date  . Diabetes mellitus  without complication (HCC)   . Hypertension   . PTSD (post-traumatic stress disorder)    History reviewed. No pertinent past surgical history. Family History: History reviewed. No pertinent family history. Father died from cancer In 2006-03-23. Mother died last year, " died in her sleep". Has one sister.  Family Psychiatric History- Denies Family history of mental illness or suicide attempts . She denies Father and uncle are described as alcoholic  Social History: She is currently homeless, Unemployed . denies legal issues, no children, currently no source of income. States she has applied for disability. History  Alcohol Use  . 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 Yes  . Special: "Crack" cocaine    Comment: crack cocaine binge 1 mth ago. thc over 5 years ago.     Social History   Social History  . Marital Status: Single    Spouse Name: N/A  . Number of Children: N/A  . Years of Education: N/A   Social History Main Topics  . Smoking status: Former Games developer  . Smokeless tobacco: None  . 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 Drug Use: Yes    Special: "Crack" cocaine     Comment: crack cocaine binge 1 mth ago. thc over 5 years ago.   Marland Kitchen Sexual Activity: Not Asked   Other Topics Concern  . None  Social History Narrative   Additional Social History:     Sleep: Good  Appetite:  Good  Current Medications: Current Facility-Administered Medications  Medication Dose Route Frequency Provider Last Rate Last Dose  . acetaminophen (TYLENOL) tablet 650 mg  650 mg Oral Q6H PRN Worthy Flank, NP   650 mg at 08/20/15 0309  . alum & mag hydroxide-simeth (MAALOX/MYLANTA) 200-200-20 MG/5ML suspension 30 mL  30 mL Oral Q4H PRN Worthy Flank, NP      . DULoxetine (CYMBALTA) DR capsule 30 mg  30 mg Oral Daily Craige Cotta, MD   30 mg at 08/21/15 0907   . gabapentin (NEURONTIN) capsule 400 mg  400 mg Oral TID Worthy Flank, NP   400 mg at 08/21/15 0907  . glipiZIDE (GLUCOTROL) tablet 5 mg  5 mg Oral BID AC Worthy Flank, NP   5 mg at 08/21/15 4098  . lisinopril (PRINIVIL,ZESTRIL) tablet 20 mg  20 mg Oral Daily Craige Cotta, MD   20 mg at 08/21/15 1191   And  . hydrochlorothiazide (HYDRODIURIL) tablet 25 mg  25 mg Oral Daily Craige Cotta, MD   25 mg at 08/21/15 0906  . Influenza vac split quadrivalent PF (FLUARIX) injection 0.5 mL  0.5 mL Intramuscular Tomorrow-1000 Fernando A Cobos, MD      . magnesium hydroxide (MILK OF MAGNESIA) suspension 30 mL  30 mL Oral Daily PRN Worthy Flank, NP      . metFORMIN (GLUCOPHAGE) tablet 1,000 mg  1,000 mg Oral BID WC Worthy Flank, NP   1,000 mg at 08/21/15 0906  . penicillin g benzathine (BICILLIN LA) 1200000 UNIT/2ML injection 2.4 Million Units  2.4 Million Units Intramuscular Once Truman Hayward, FNP      . pneumococcal 23 valent vaccine (PNU-IMMUNE) injection 0.5 mL  0.5 mL Intramuscular Tomorrow-1000 Rockey Situ Cobos, MD      . risperiDONE (RISPERDAL) tablet 2 mg  2 mg Oral QHS Craige Cotta, MD   2 mg at 08/20/15 2256  . traZODone (DESYREL) tablet 50 mg  50 mg Oral QHS PRN Craige Cotta, MD   50 mg at 08/20/15 2256    Lab Results:  Results for orders placed or performed during the hospital encounter of 08/20/15 (from the past 48 hour(s))  Glucose, capillary     Status: Abnormal   Collection Time: 08/20/15  6:21 AM  Result Value Ref Range   Glucose-Capillary 117 (H) 65 - 99 mg/dL  Glucose, capillary     Status: Abnormal   Collection Time: 08/20/15 12:03 PM  Result Value Ref Range   Glucose-Capillary 177 (H) 65 - 99 mg/dL  Glucose, capillary     Status: Abnormal   Collection Time: 08/20/15  5:30 PM  Result Value Ref Range   Glucose-Capillary 237 (H) 65 - 99 mg/dL  Glucose, capillary     Status: Abnormal   Collection Time: 08/20/15  9:08 PM  Result Value Ref Range    Glucose-Capillary 179 (H) 65 - 99 mg/dL  Glucose, capillary     Status: None   Collection Time: 08/21/15  6:22 AM  Result Value Ref Range   Glucose-Capillary 87 65 - 99 mg/dL  Lipid panel     Status: None   Collection Time: 08/21/15  6:52 AM  Result Value Ref Range   Cholesterol 171 0 - 200 mg/dL   Triglycerides 478 <295 mg/dL   HDL 44 >62 mg/dL   Total CHOL/HDL Ratio 3.9 RATIO  VLDL 29 0 - 40 mg/dL   LDL Cholesterol 98 0 - 99 mg/dL    Comment:        Total Cholesterol/HDL:CHD Risk Coronary Heart Disease Risk Table                     Men   Women  1/2 Average Risk   3.4   3.3  Average Risk       5.0   4.4  2 X Average Risk   9.6   7.1  3 X Average Risk  23.4   11.0        Use the calculated Patient Ratio above and the CHD Risk Table to determine the patient's CHD Risk.        ATP III CLASSIFICATION (LDL):  <100     mg/dL   Optimal  161-096  mg/dL   Near or Above                    Optimal  130-159  mg/dL   Borderline  045-409  mg/dL   High  >811     mg/dL   Very High Performed at [facility]   TSH     Status: None   Collection Time: 08/21/15  6:52 AM  Result Value Ref Range   TSH 3.666 0.350 - 4.500 uIU/mL    Comment: Performed at [facility]  Glucose, capillary     Status: Abnormal   Collection Time: 08/21/15 12:10 PM  Result Value Ref Range   Glucose-Capillary 102 (H) 65 - 99 mg/dL   Comment 1 Notify RN    Comment 2 Document in Chart     Physical Findings: AIMS: Facial and Oral Movements Muscles of Facial Expression: None, normal Lips and Perioral Area: None, normal Jaw: None, normal Tongue: None, normal,Extremity Movements Upper (arms, wrists, hands, fingers): None, normal Lower (legs, knees, ankles, toes): None, normal, Trunk Movements Neck, shoulders, hips: None, normal, Overall Severity Severity of abnormal movements (highest score from questions above): None, normal Incapacitation due to abnormal movements: None,  normal Patient's awareness of abnormal movements (rate only patient's report): No Awareness, Dental Status Current problems with teeth and/or dentures?: No Does patient usually wear dentures?: No  CIWA:    COWS:     Musculoskeletal: Strength & Muscle Tone: within normal limits Gait & Station: normal Patient leans: N/A  Psychiatric Specialty Exam: Review of Systems  Psychiatric/Behavioral: Positive for depression, suicidal ideas and hallucinations (auditory). Negative for memory loss and substance abuse. The patient is nervous/anxious and has insomnia.   All other systems reviewed and are negative.   Blood pressure 101/62, pulse 97, temperature 98.8 F (37.1 C), temperature source Oral, resp. rate 16, height  (1.676 m), weight 136.533 kg (301 lb), last menstrual period 07/21/2015.Body mass index is 48.61 kg/(m^2).  General Appearance: Fairly Groomed  Patent attorney::  Fair  Speech:  Clear and Coherent and Normal Rate  Volume:  Normal  Mood:  Euthymic  Affect:  Appropriate and Congruent  Thought Process:  Goal Directed, Intact and Linear  Orientation:  Full (Time, Place, and Person)  Thought Content:  WDL  Suicidal Thoughts:  No  Homicidal Thoughts:  No  Memory:  Immediate;   Fair Recent;   Good Remote;   Fair  Judgement:  Fair  Insight:  Good  Psychomotor Activity:  Normal  Concentration:  Good  Recall:  [place] Major of Knowledge:Fair  Language: Fair  Akathisia:  Yes  Handed:  Right  AIMS (if indicated):     Assets:  Communication Skills Desire for Improvement Financial Resources/Insurance Social Support Talents/Skills Transportation  ADL's:  Intact  Cognition: WNL  Sleep:      Treatment Plan Summary: Daily contact with patient to assess and evaluate symptoms and progress in treatment, Medication management and Plan We discussed options- due to concern that her current medications not working and because of chronic pain issues, agrees to Cymbalta trial- start 30  mgrs QDAY   Pt states she had two positive Syphillis test and is being treated at the [facility]. Will place One time order of Ben Penicillin 2.80mil units IM in a single dose. Will f/u with [facility] on Monday.   Malachy Chamber S FNP-BC 08/21/2015, 2:34 PM

## 2015-08-22 DIAGNOSIS — F333 Major depressive disorder, recurrent, severe with psychotic symptoms: Principal | ICD-10-CM

## 2015-08-22 LAB — GLUCOSE, CAPILLARY
GLUCOSE-CAPILLARY: 112 mg/dL — AB (ref 65–99)
GLUCOSE-CAPILLARY: 123 mg/dL — AB (ref 65–99)

## 2015-08-22 MED ORDER — TRAZODONE HCL 100 MG PO TABS
100.0000 mg | ORAL_TABLET | Freq: Every evening | ORAL | Status: DC | PRN
  Administered 2015-08-22 – 2015-08-23 (×2): 100 mg via ORAL
  Filled 2015-08-22 (×2): qty 1

## 2015-08-22 MED ORDER — NICOTINE POLACRILEX 2 MG MT GUM: 2.0000 mg | CHEWING_GUM | OROMUCOSAL | Status: DC | PRN

## 2015-08-22 NOTE — [facility] Group Notes (Signed)
[facility] Group Notes:  (Clinical Social Work)  08/22/2015  1:15-2:15PM  Summary of Progress/Problems:   The main focus of today's process group was to   1)  discuss the importance of adding supports  2)  define health supports versus unhealthy supports  3)  identify the patient's current unhealthy supports and plan how to handle them  4)  Identify the patient's current healthy supports and plan what to add.  An emphasis was placed on using counselor, doctor, therapy groups, 12-step groups, and problem-specific support groups to expand supports.    The patient expressed full comprehension of the concepts presented, and agreed that there is a need to add more supports.  The patient stated little during group, but did appear to be listening.  Type of Therapy:  Process Group with Motivational Interviewing  Participation Level:  Active  Participation Quality:  Attentive  Affect:  Blunted and Irritable  Cognitive:  Appropriate  Insight:  Improving  Engagement in Therapy:  Engaged  Modes of Intervention:   Education, Support and Processing, Activity  Ambrose Mantle, LCSW 08/22/2015

## 2015-08-22 NOTE — Progress Notes (Signed)
[facility] MD Progress Note  08/22/2015 10:31 AM Hailey Klein  MRN:  [id number] Subjective:  49 year old female, presented to the ED because she had been feeling progressively more depressed . My "thinking has gotten better since I been here, now I just have to think of a place to go." I was in [place] (substance abuse treatment) and I was hearing voices and thoughts of suicide, so I was transferred here."     Patient  has been compliant with his medication and inpatient psychiatric program including milieu therapy and group therapy. Patient  has a disturbance of sleep and appetite. Patient rates depression  7/10([facility]  hopelessness 7/10 and anxiety 7/10 and denies SI/HI/AVH. Patient reports that he main stressors contributing to her increased depression is finding a place upon discharge "somewhere safe". Denies any SI/HI/AVH. Patient stated that she is feeling safer in the hospital contract for safety while in the hospital. Denies any side effects of the medications.  Principal Problem: MDD (major depressive disorder), recurrent, severe, with psychosis (HCC) Diagnosis:   Patient Active Problem List   Diagnosis Date Noted  . Major depressive disorder, recurrent (HCC) [F33.9] 08/20/2015  . MDD (major depressive disorder), recurrent, severe, with psychosis (HCC) [F33.3] 08/20/2015   Total Time spent with patient: 30 minutes  Past Psychiatric History: MDD  Past Medical History:  Past Medical History  Diagnosis Date  . Diabetes mellitus without complication (HCC)   . Hypertension   . PTSD (post-traumatic stress disorder)    History reviewed. No pertinent past surgical history. Family History: History reviewed. No pertinent family history. Father died from cancer In Mar 14, 2006. Mother died last year, " died in her sleep". Has one sister.  Family Psychiatric History- Denies Family history of mental illness or suicide attempts . She denies Father  and uncle are described as alcoholic  Social History: She is currently homeless, Unemployed . denies legal issues, no children, currently no source of income. States she has applied for disability. History  Alcohol Use  . 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 Yes  . Special: "Crack" cocaine    Comment: crack cocaine binge 1 mth ago. thc over 5 years ago.     Social History   Social History  . Marital Status: Single    Spouse Name: N/A  . Number of Children: N/A  . Years of Education: N/A   Social History Main Topics  . Smoking status: Former Games developer  . Smokeless tobacco: None  . 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 Drug Use: Yes    Special: "Crack" cocaine     Comment: crack cocaine binge 1 mth ago. thc over 5 years ago.   Marland Kitchen Sexual Activity: Not Asked   Other Topics Concern  . None   Social History Narrative   Additional Social History:     Sleep: Fair  Appetite:  Good  Current Medications: Current Facility-Administered Medications  Medication Dose Route Frequency Provider Last Rate Last Dose  . acetaminophen (TYLENOL) tablet 650 mg  650 mg Oral Q6H PRN Worthy Flank, NP   650 mg at 08/20/15 0309  . alum & mag hydroxide-simeth (MAALOX/MYLANTA) 200-200-20 MG/5ML suspension 30 mL  30 mL Oral Q4H  PRN Worthy Flank, NP      . DULoxetine (CYMBALTA) DR capsule 30 mg  30 mg Oral Daily Craige Cotta, MD   30 mg at 08/22/15 1027  . gabapentin (NEURONTIN) capsule 400 mg  400 mg Oral TID Worthy Flank, NP   400 mg at 08/22/15 1026  . glipiZIDE (GLUCOTROL) tablet 5 mg  5 mg Oral BID AC Worthy Flank, NP   5 mg at 08/22/15 9604  . lisinopril (PRINIVIL,ZESTRIL) tablet 20 mg  20 mg Oral Daily Rockey Situ Cobos, MD   20 mg at 08/22/15 1026   And  . hydrochlorothiazide (HYDRODIURIL) tablet 25 mg  25 mg Oral Daily Rockey Situ Cobos, MD   25 mg  at 08/22/15 1026  . magnesium hydroxide (MILK OF MAGNESIA) suspension 30 mL  30 mL Oral Daily PRN Worthy Flank, NP      . metFORMIN (GLUCOPHAGE) tablet 1,000 mg  1,000 mg Oral BID WC Worthy Flank, NP   1,000 mg at 08/22/15 1026  . risperiDONE (RISPERDAL) tablet 2 mg  2 mg Oral QHS Craige Cotta, MD   2 mg at 08/21/15 2125  . traZODone (DESYREL) tablet 50 mg  50 mg Oral QHS PRN Craige Cotta, MD   50 mg at 08/21/15 2125    Lab Results:  Results for orders placed or performed during the hospital encounter of 08/20/15 (from the past 48 hour(s))  Glucose, capillary     Status: Abnormal   Collection Time: 08/20/15 12:03 PM  Result Value Ref Range   Glucose-Capillary 177 (H) 65 - 99 mg/dL  Glucose, capillary     Status: Abnormal   Collection Time: 08/20/15  5:30 PM  Result Value Ref Range   Glucose-Capillary 237 (H) 65 - 99 mg/dL  Glucose, capillary     Status: Abnormal   Collection Time: 08/20/15  9:08 PM  Result Value Ref Range   Glucose-Capillary 179 (H) 65 - 99 mg/dL  Glucose, capillary     Status: None   Collection Time: 08/21/15  6:22 AM  Result Value Ref Range   Glucose-Capillary 87 65 - 99 mg/dL  Lipid panel     Status: None   Collection Time: 08/21/15  6:52 AM  Result Value Ref Range   Cholesterol 171 0 - 200 mg/dL   Triglycerides 540 <981 mg/dL   HDL 44 >19 mg/dL   Total CHOL/HDL Ratio 3.9 RATIO   VLDL 29 0 - 40 mg/dL   LDL Cholesterol 98 0 - 99 mg/dL    Comment:        Total Cholesterol/HDL:CHD Risk Coronary Heart Disease Risk Table                     Men   Women  1/2 Average Risk   3.4   3.3  Average Risk       5.0   4.4  2 X Average Risk   9.6   7.1  3 X Average Risk  23.4   11.0        Use the calculated Patient Ratio above and the CHD Risk Table to determine the patient's CHD Risk.        ATP III CLASSIFICATION (LDL):  <100     mg/dL   Optimal  147-829  mg/dL   Near or Above                    Optimal  130-159  mg/dL   Borderline  161-096   mg/dL   High  >045     mg/dL   Very High Performed at [facility]   TSH     Status: None   Collection Time: 08/21/15  6:52 AM  Result Value Ref Range   TSH 3.666 0.350 - 4.500 uIU/mL    Comment: Performed at [facility]  Glucose, capillary     Status: Abnormal   Collection Time: 08/21/15 12:10 PM  Result Value Ref Range   Glucose-Capillary 102 (H) 65 - 99 mg/dL   Comment 1 Notify RN    Comment 2 Document in Chart   Glucose, capillary     Status: Abnormal   Collection Time: 08/21/15  8:41 PM  Result Value Ref Range   Glucose-Capillary 137 (H) 65 - 99 mg/dL  Glucose, capillary     Status: Abnormal   Collection Time: 08/22/15  6:08 AM  Result Value Ref Range   Glucose-Capillary 112 (H) 65 - 99 mg/dL    Physical Findings: AIMS: Facial and Oral Movements Muscles of Facial Expression: None, normal Lips and Perioral Area: None, normal Jaw: None, normal Tongue: None, normal,Extremity Movements Upper (arms, wrists, hands, fingers): None, normal Lower (legs, knees, ankles, toes): None, normal, Trunk Movements Neck, shoulders, hips: None, normal, Overall Severity Severity of abnormal movements (highest score from questions above): None, normal Incapacitation due to abnormal movements: None, normal Patient's awareness of abnormal movements (rate only patient's report): No Awareness, Dental Status Current problems with teeth and/or dentures?: No Does patient usually wear dentures?: No  CIWA:    COWS:     Musculoskeletal: Strength & Muscle Tone: within normal limits Gait & Station: normal Patient leans: N/A  Psychiatric Specialty Exam: Review of Systems  Psychiatric/Behavioral: Positive for depression, suicidal ideas and hallucinations (auditory). Negative for memory loss and substance abuse. The patient is nervous/anxious and has insomnia.   All other systems reviewed and are negative.   Blood pressure 111/69, pulse 105, temperature 97.6 F (36.4 C),  temperature source Oral, resp. rate 16, height 5\' 6"  (1.676 m), weight 136.533 kg (301 lb), last menstrual period 07/21/2015.Body mass index is 48.61 kg/(m^2).  General Appearance: Fairly Groomed, wearing scrubs  Eye Contact::  Fair  Speech:  Clear and Coherent and Normal Rate  Volume:  Normal  Mood:  Depressed, Hopeless and Worthless  Affect:  Depressed and Flat  Thought Process:  Goal Directed, Intact and Linear  Orientation:  Full (Time, Place, and Person)  Thought Content:  WDL  Suicidal Thoughts:  No  Homicidal Thoughts:  No  Memory:  Immediate;   Fair Recent;   Good Remote;   Fair  Judgement:  Fair  Insight:  Good  Psychomotor Activity:  Normal  Concentration:  Good  Recall:  Good  Fund of Knowledge:Fair  Language: Fair  Akathisia:  Yes  Handed:  Right  AIMS (if indicated):     Assets:  Communication Skills Desire for Improvement Financial Resources/Insurance Social Support Talents/Skills Transportation  ADL's:  Intact  Cognition: WNL  Sleep:  Number of Hours: 6.25   Treatment Plan Summary: Daily contact with patient to assess and evaluate symptoms and progress in treatment, Medication management and Plan We discussed options- due to concern that her current medications not working and because of chronic pain issues, agrees to Cymbalta trial- start 30 mgrs QDAY   Pt states she had two positive Syphillis test and is being treated at the [facility]. Will place One time order  of Ben Penicillin 2.57mil units IM in a single dose. Will f/u with [facility] on Monday.  She is having difficulty maintaining and initating sleep, will adjust Trazodone dose.   Malachy Chamber S FNP-BC 08/22/2015, 10:31 AM

## 2015-08-22 NOTE — Progress Notes (Signed)
Psychoeducational Group Note  Date: 08/22/2015 Time:  0930 Group Topic/Focus:  Gratefulness:  The focus of this group is to help patients identify what two things they are most grateful for in their lives. What helps ground them and to center them on their work to their recovery.  Participation Level:  Active  Participation Quality:  Appropriate  Affect:  Appropriate  Cognitive:  Oriented  Insight:  Improving  Engagement in Group:  Engaged  Additional Comments:  Pt attended and participated.  Kennard Fildes A  

## 2015-08-22 NOTE — Progress Notes (Addendum)
[name] is doing better today and this is evidenced in the way she carries herself, her interaction with her peers ( she laughs and jokes and says ' Miss Patty....------- and I have decided you need to switch with [place]...we'lll run the unit and you can  be the patient!!!". She slept late ( until the 2nd group) but when she got OOB, she came to group and was engaged , attentive and stated ( afterwards) that she felt like she benefited from the group.    A  She has stayed out of her room ...ever since getting up this morning at 10:00 . She did complete her daily assessment and on it she wrote  She denied SI today and she rated her depression, hopelessness and anxiety   " 3/3/3"  She says " Im working on getting better".    R Safety in place too.

## 2015-08-22 NOTE — Progress Notes (Signed)
[facility] Group Notes:  (Nursing/MHT/Case Management/Adjunct)  Date:  08/22/2015  Time:  2100 Type of Therapy:  wrap up group  Participation Level:  Active  Participation Quality:  Appropriate, Attentive, Sharing and Supportive  Affect:  Appropriate  Cognitive:  Appropriate  Insight:  Improving  Engagement in Group:  Engaged  Modes of Intervention:  Clarification, Education and Support  Summary of Progress/Problems: Pt really enjoyed getting to go outside today. Pt hopes to be able to return to [facility] and has plans to contact the counselor there tomorrow. Pt now has 48 days clean and names her sister as her biggest support.   Shelah Lewandowsky 08/22/2015, 10:59 PM

## 2015-08-22 NOTE — [facility] Group Notes (Signed)
[facility] Group Notes:  (Nursing/MHT/Case Management/Adjunct)  Date:  08/22/2015  Time:  1045   Type of Therapy:  Nurse Education  //  Life Skills The group is focused on teaching patients th importance of developing healthy support systems .  Participation Level:  Active  Participation Quality:  Attentive  Affect:  Angry  Cognitive:  Alert  Insight:  Appropriate  Engagement in Group:  Engaged  Modes of Intervention:  Education  Summary of Progress/Problems:  Hailey Klein 08/22/2015, 6:51 PM

## 2015-08-23 LAB — GLUCOSE, CAPILLARY
GLUCOSE-CAPILLARY: 218 mg/dL — AB (ref 65–99)
GLUCOSE-CAPILLARY: 144 mg/dL — AB (ref 65–99)
Glucose-Capillary: 116 mg/dL — ABNORMAL HIGH (ref 65–99)
Glucose-Capillary: 146 mg/dL — ABNORMAL HIGH (ref 65–99)

## 2015-08-23 LAB — HEMOGLOBIN A1C
HEMOGLOBIN A1C: 5.1 % (ref 4.8–5.6)
Mean Plasma Glucose: 100 mg/dL

## 2015-08-23 MED ORDER — TRAZODONE HCL 100 MG PO TABS
100.0000 mg | ORAL_TABLET | Freq: Every evening | ORAL | Status: DC | PRN
  Administered 2015-08-23: 100 mg via ORAL
  Filled 2015-08-23 (×6): qty 1

## 2015-08-23 MED ORDER — DULOXETINE HCL 20 MG PO CPEP
40.0000 mg | ORAL_CAPSULE | Freq: Every day | ORAL | Status: DC
  Administered 2015-08-24: 40 mg via ORAL
  Filled 2015-08-23 (×3): qty 2

## 2015-08-23 MED ORDER — TRAZODONE HCL 100 MG PO TABS: 100.0000 mg | ORAL_TABLET | Freq: Every evening | ORAL | Status: DC | PRN

## 2015-08-23 NOTE — [facility] Group Notes (Signed)
[facility] LCSW Group Therapy  08/23/2015 1:15pm  Type of Therapy:  Group Therapy vercoming Obstacles  Participation Level:  Minimal  Participation Quality:  Reserved  Affect:  Appropriate  Cognitive:  Appropriate and Oriented  Insight:  Developing/Improving and Improving  Engagement in Therapy:  Improving  Modes of Intervention:  Discussion, Exploration, Problem-solving and Support  Description of Group:   In this group patients will be encouraged to explore what they see as obstacles to their own wellness and recovery. They will be guided to discuss their thoughts, feelings, and behaviors related to these obstacles. The group will process together ways to cope with barriers, with attention given to specific choices patients can make. Each patient will be challenged to identify changes they are motivated to make in order to overcome their obstacles. This group will be process-oriented, with patients participating in exploration of their own experiences as well as giving and receiving support and challenge from other group members.  Summary of Patient Progress: Pt participates minimally in group discussion but identified her addiction as a main obstacle in her life. She also described motivation to continue in treatment in order to be sober and give others less power in how she ascribes value to herself.   Therapeutic Modalities:   Cognitive Behavioral Therapy Solution Focused Therapy Motivational Interviewing Relapse Prevention Therapy   Chad Cordial, LCSWA 08/23/2015 5:20 PM

## 2015-08-23 NOTE — Progress Notes (Addendum)
Patient ID: Hailey Klein, female   DOB: 1965-11-27, 49 y.o.   MRN: [id number]  DAR: Pt. Denies SI/HI and A/V Hallucinations. She reports pain in left deltoid due to injection she received yesterday. PRN Tylenol administered to patient with reported relief on reassessment. She reports her sleep last night was good, appetite is good, energy level is normal, and concentration is poor. Patient rates her depression, hopelessness, and anxiety 3/10. Support and encouragement provided to the patient. Scheduled medication administered to patient per physician's orders. Patient is receptive and cooperative. She is seen mostly in the dayroom throughout the day interacting with her peers. Q15 minute checks are maintained for safety.

## 2015-08-23 NOTE — Progress Notes (Signed)
D: Pt presents flat in affect but brightens upon interaction. Pt observed interacting appropriately with others. Pt is visible and active within the milieu. Pt denied any suicidal ideation. Pt endorses insomnia. Per pt, her trazodone dose was to be increased to 100 mg. Chart reviewed by RN. "[facility] MD Progress Note" listed a plan for pt's trazodone to be adjusted. On-call provider contacted. Pt's dose increased to 100 mg.  A: Writer administered scheduled and prn medications to pt. Continued support and availability as needed was extended to this pt. Staff continue to monitor pt with q36min checks.  R: No adverse drug reactions noted. Pt receptive to treatment. Pt remains safe at this time.

## 2015-08-23 NOTE — Plan of Care (Signed)
Problem: Ineffective individual coping Goal: STG: Patient will remain free from self harm Outcome: Progressing Patient remains free of harm at this time. She denies SI.

## 2015-08-23 NOTE — Progress Notes (Signed)
D: Patient in the dayroom on first approach.  Patient states she had a good day.  Patient states she is supposed to discharge tomorrow.  Patient states she is supposed to go bck to [facility].  Patient states her goal for today was to be nicer to people.  Patient states she met her goal for today.  Patient denies SI/HI and denies AVH. A: Staff to monitor Q 15 mins for safety.  Encouragement and support offered.  Scheduled medications administered per orders.  Trazodone administered prn for sleep. R: Patient remains safe on the unit.  Patient attended group tonight.  Patient visible on the unit and interacting with peers.  Patient taking adminsitered medications.

## 2015-08-23 NOTE — [facility] Group Notes (Signed)
[facility] LCSW Aftercare Discharge Planning Group Note  08/23/2015 8:45 AM  Participation Quality: Alert, Appropriate and Oriented  Mood/Affect: Lethargic  Depression Rating: 3  Anxiety Rating: 3  Thoughts of Suicide: Pt denies SI/HI  Will you contract for safety? Yes  Current AVH: Pt denies  Plan for Discharge/Comments: Pt attended discharge planning group and actively participated in group. CSW discussed suicide prevention education with the group and encouraged them to discuss discharge planning and any relevant barriers. Pt reports feeling sleepy and states that it is due to her medication. Pt describes her plan to return to [facility].   Transportation Means: Pt reports access to transportation  Supports: Sister  Chad Cordial, Theresia Majors 08/23/2015 9:39 AM

## 2015-08-23 NOTE — Plan of Care (Signed)
Problem: Diagnosis: Increased Risk For Suicide Attempt Goal: STG-Patient Will Attend All Groups On The Unit Outcome: Progressing Pt attended the evening group with active participation.

## 2015-08-23 NOTE — Progress Notes (Signed)
Adult Psychoeducational Group Note  Date:  08/23/2015 Time:  8:25 PM  Group Topic/Focus:  Wrap-Up Group:   The focus of this group is to help patients review their daily goal of treatment and discuss progress on daily workbooks.  Participation Level:  Active  Participation Quality:  Appropriate  Affect:  Appropriate  Cognitive:  Appropriate  Insight: Good  Engagement in Group:  Engaged  Modes of Intervention:  Discussion  Additional Comments:  Pt rated overall day a 10 out of 10 because she is leaving tomorrow. Pt reported that a positive part of her day was sobriety, she is glad to be sober another day. Pt reported that her goal for the day was to be discharged tomorrow, which she feels that she achieved.   Cleotilde Neer 08/23/2015, 9:06 PM

## 2015-08-23 NOTE — Progress Notes (Addendum)
Patient ID: Hailey Klein, female   DOB: 05-06-1966, 49 y.o.   MRN: [id number] [facility] MD Progress Note  08/23/2015 1:59 PM Hailey Klein  MRN:  [id number] Subjective:  Patient states she is feeling partially better, although still characterizes her mood as depressed, sad, but not to the degree she had felt prior to admission. She denies medication side effects at this time. Objective : I have discussed case with treatment team  and have met with patient. Compared to admission, she is presenting with a more reactive affect and less depression, although she reports ongoing subjective feeling of sadness and some anxiety. Of note, she states that the auditory hallucinations she had been experiencing prior to admission have resolved . She feels current medication regimen is helping . Does not endorse medication side effects. She is visible on unit and has been going to some groups , more visible in milieu, more interactive with peers and staff . No agitated or disruptive behaviors on unit. She is becoming more future oriented and today focused more on Appropriate questions about disposition plans ( at this time plans to return to [facility] )  And about whether she will get prescriptions for her current medications upon discharge .  Principal Problem: MDD (major depressive disorder), recurrent, severe, with psychosis ([place]) Diagnosis:   Patient Active Problem List   Diagnosis Date Noted  . Major depressive disorder, recurrent ([place]) [F33.9] 08/20/2015  . MDD (major depressive disorder), recurrent, severe, with psychosis ([place]) [F33.3] 08/20/2015   Total Time spent with patient: 20 minutes  Past Psychiatric History: MDD  Past Medical History:  Past Medical History  Diagnosis Date  . Diabetes mellitus without complication ([place])   . Hypertension   . PTSD (post-traumatic stress disorder)    History reviewed. No pertinent past surgical history. Family History: History reviewed. No pertinent  family history. Father died from cancer In 01-13-06. Mother died last year, " died in her sleep". Has one sister.  Family Psychiatric History- Denies Family history of mental illness or suicide attempts . She denies Father and uncle are described as alcoholic  Social History: She is currently homeless, Unemployed . denies legal issues, no children, currently no source of income. States she has applied for disability. History  Alcohol Use  . 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 Yes  . Special: "Crack" cocaine    Comment: crack cocaine binge 1 mth ago. thc over 5 years ago.     Social History   Social History  . Marital Status: Single    Spouse Name: N/A  . Number of Children: N/A  . Years of Education: N/A   Social History Main Topics  . Smoking status: Former Research scientist (life sciences)  . Smokeless tobacco: None  . 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 Drug Use: Yes    Special: "Crack" cocaine     Comment: crack cocaine binge 1 mth ago. thc over 5 years ago.   Marland Kitchen Sexual Activity: Not Asked   Other Topics Concern  . None   Social History Narrative   Additional Social History:     Sleep: improved   Appetite:  Good  Current Medications: Current Facility-Administered Medications  Medication Dose Route Frequency Provider Last Rate Last Dose  . acetaminophen (TYLENOL) tablet  650 mg  650 mg Oral Q6H PRN Harriet Butte, NP   650 mg at 08/23/15 0815  . alum & mag hydroxide-simeth (MAALOX/MYLANTA) 200-200-20 MG/5ML suspension 30 mL  30 mL Oral Q4H PRN Harriet Butte, NP      . DULoxetine (CYMBALTA) DR capsule 30 mg  30 mg Oral Daily Jenne Campus, MD   30 mg at 08/23/15 0813  . gabapentin (NEURONTIN) capsule 400 mg  400 mg Oral TID Harriet Butte, NP   400 mg at 08/23/15 1200  . glipiZIDE (GLUCOTROL) tablet 5 mg  5 mg Oral BID AC Harriet Butte, NP   5 mg at  08/23/15 3570  . lisinopril (PRINIVIL,ZESTRIL) tablet 20 mg  20 mg Oral Daily Jenne Campus, MD   20 mg at 08/23/15 0813   And  . hydrochlorothiazide (HYDRODIURIL) tablet 25 mg  25 mg Oral Daily Jenne Campus, MD   25 mg at 08/23/15 0813  . magnesium hydroxide (MILK OF MAGNESIA) suspension 30 mL  30 mL Oral Daily PRN Harriet Butte, NP      . metFORMIN (GLUCOPHAGE) tablet 1,000 mg  1,000 mg Oral BID WC Harriet Butte, NP   1,000 mg at 08/23/15 0814  . nicotine polacrilex (NICORETTE) gum 2 mg  2 mg Oral PRN Jenne Campus, MD      . risperiDONE (RISPERDAL) tablet 2 mg  2 mg Oral QHS Jenne Campus, MD   2 mg at 08/22/15 2148  . traZODone (DESYREL) tablet 100 mg  100 mg Oral QHS PRN Harriet Butte, NP   100 mg at 08/22/15 2151    Lab Results:  Results for orders placed or performed during the hospital encounter of 08/20/15 (from the past 48 hour(s))  Glucose, capillary     Status: Abnormal   Collection Time: 08/21/15  5:03 PM  Result Value Ref Range   Glucose-Capillary 218 (H) 65 - 99 mg/dL  Glucose, capillary     Status: Abnormal   Collection Time: 08/21/15  8:41 PM  Result Value Ref Range   Glucose-Capillary 137 (H) 65 - 99 mg/dL  Glucose, capillary     Status: Abnormal   Collection Time: 08/22/15  6:08 AM  Result Value Ref Range   Glucose-Capillary 112 (H) 65 - 99 mg/dL  Glucose, capillary     Status: Abnormal   Collection Time: 08/22/15 11:49 AM  Result Value Ref Range   Glucose-Capillary 123 (H) 65 - 99 mg/dL   Comment 1 Notify RN    Comment 2 Document in Chart   Glucose, capillary     Status: Abnormal   Collection Time: 08/22/15  4:53 PM  Result Value Ref Range   Glucose-Capillary 144 (H) 65 - 99 mg/dL   Comment 1 Notify RN    Comment 2 Document in Chart   Glucose, capillary     Status: Abnormal   Collection Time: 08/23/15  6:06 AM  Result Value Ref Range   Glucose-Capillary 116 (H) 65 - 99 mg/dL    Physical Findings: AIMS: Facial and Oral  Movements Muscles of Facial Expression: None, normal Lips and Perioral Area: None, normal Jaw: None, normal Tongue: None, normal,Extremity Movements Upper (arms, wrists, hands, fingers): None, normal Lower (legs, knees, ankles, toes): None, normal, Trunk Movements Neck, shoulders, hips: None, normal, Overall Severity Severity of abnormal movements (highest score from questions above): None, normal Incapacitation due to abnormal movements: None, normal Patient's awareness of abnormal movements (rate only patient's report):  No Awareness, Dental Status Current problems with teeth and/or dentures?: No Does patient usually wear dentures?: No  CIWA:    COWS:     Musculoskeletal: Strength & Muscle Tone: within normal limits Gait & Station: normal Patient leans: N/A  Psychiatric Specialty Exam: Review of Systems  Psychiatric/Behavioral: Positive for depression, suicidal ideas and hallucinations (auditory). Negative for memory loss and substance abuse. The patient is nervous/anxious and has insomnia.   All other systems reviewed and are negative. currently denies headache, denies vomiting, denies Shortness of breath or chest pain   Blood pressure 116/64, pulse 107, temperature 98.6 F (37 C), temperature source Oral, resp. rate 24, height 5' 6"  (1.676 m), weight 301 lb (136.533 kg), last menstrual period 07/21/2015.Body mass index is 48.61 kg/(m^2).  General Appearance: improved grooming compared to admission  Eye Contact::   Good   Speech:  Clear and Coherent and Normal Rate  Volume:  Normal  Mood:   Still depressed, but improved compared to admission presentation  Affect:   Less constricted, a little more reactive   Thought Process:  Goal Directed, Intact and Linear  Orientation:  Full (Time, Place, and Person)  Thought Content:   Denies hallucinations at this time, does not express delusional thoughts at present  Suicidal Thoughts:  No denies any current plan or intention of hurting  self or of SI  Homicidal Thoughts:  No  Memory:  Immediate;   Fair Recent;   Good Remote;   Fair  Judgement:   Improving   Insight:   improving  Psychomotor Activity:  Normal  Concentration:  Good  Recall:  Good  Fund of Knowledge:Good  Language: Good  Akathisia:  Negative  Handed:  Right  AIMS (if indicated):     Assets:  Communication Skills Desire for Improvement Financial Resources/Insurance Social Support Talents/Skills Transportation  ADL's:  Intact  Cognition: WNL  Sleep:  Number of Hours: 6  Assessment - patient is improving compared to admission. She is less depressed, less constricted in affect, more future oriented, more engaged in milieu. She does continue to report some depression, but denies SI at this time . Thus far she is tolerating medications, including Cymbalta/ risperidone  trial, well . No akathisia noted at this time.  She is focused on maintaining sobriety after discharge and wants to return to residential sober setting after being discharged from unit .  Treatment Plan Summary: Continue inpatient treatment  Treatment team working on disposition plans - patient wanting to return to [facility] after discharge. Increase Cymbalta to 40 mgrs QAM for depression and anxiety Continue Neurontin 400 mgrs TID for anxiety and chronic pain Continue Risperidone 2 mgrs QHS for psychotic symptoms Continue Trazodone 100 mgrs QHS PRN for insomnia as needed  Continue Zestril / HCTZ for HTN Continue Glucophage and Glipizide for management of DM. Continue to encourage group participation , milieu participation to work on symptom reduction and coping skills    Neita Garnet MD  08/23/2015, 1:59 PM

## 2015-08-24 LAB — GLUCOSE, CAPILLARY
GLUCOSE-CAPILLARY: 187 mg/dL — AB (ref 65–99)
Glucose-Capillary: 139 mg/dL — ABNORMAL HIGH (ref 65–99)

## 2015-08-24 MED ORDER — DULOXETINE HCL 60 MG PO CPEP
60.0000 mg | ORAL_CAPSULE | Freq: Every day | ORAL | Status: DC
  Administered 2015-08-25: 60 mg via ORAL
  Filled 2015-08-24 (×3): qty 1
  Filled 2015-08-24: qty 7

## 2015-08-24 MED ORDER — TRAZODONE HCL 150 MG PO TABS
150.0000 mg | ORAL_TABLET | Freq: Every evening | ORAL | Status: DC | PRN
  Administered 2015-08-24: 150 mg via ORAL
  Filled 2015-08-24: qty 7
  Filled 2015-08-24: qty 1

## 2015-08-24 NOTE — Tx Team (Signed)
Interdisciplinary Treatment Plan Update (Adult) Date: 08/24/2015   Date: 08/24/2015 3:26 PM  Progress in Treatment:  Attending groups: Yes  Participating in groups: Yes  Taking medication as prescribed: Yes  Tolerating medication: Yes  Family/Significant othe contact made: No, CSW attempting to make contact with sister Patient understands diagnosis: Yes Discussing patient identified problems/goals with staff: Yes  Medical problems stabilized or resolved: Yes  Denies suicidal/homicidal ideation: Yes Patient has not harmed self or Others: Yes   New problem(s) identified: None identified at this time.   Discharge Plan or Barriers: Pt will return to [facility] for continued treatment.  Additional comments: n/a   Reason for Continuation of Hospitalization:  Depression Medication stabilization Suicidal ideation  Estimated length of stay: 3-5 days  Review of initial/current patient goals per problem list:   1.  Goal(s): Patient will participate in aftercare plan  Met:  Yes  Target date: 3-5 days from date of admission   As evidenced by: Patient will participate within aftercare plan AEB aftercare provider and housing plan at discharge being identified.   08/24/15: Pt returning to [facility] for continued residential substance abuse treatment.  2.  Goal (s): Patient will exhibit decreased depressive symptoms and suicidal ideations.  Met:  Yes  Target date: 3-5 days from date of admission   As evidenced by: Patient will utilize self rating of depression at 3 or below and demonstrate decreased signs of depression or be deemed stable for discharge by MD.  08/24/15: Pt rates depression at 3/10. Denies SI  Attendees:  Patient:    Family:    Physician: Dr. Parke Poisson, MD  08/24/2015 3:26 PM  Nursing: Lars Pinks, RN Case manager  08/24/2015 3:26 PM  Clinical Social Worker Norman Clay, MSW 08/24/2015 3:26 PM  Other: Lucinda Dell, Beverly Sessions Liasion 08/24/2015  3:26 PM  Clinical: Grayland Ormond, RN 08/24/2015 3:26 PM  Other: , RN Charge Nurse 08/24/2015 3:26 PM  Other:     Peri Maris, [place] MSW

## 2015-08-24 NOTE — Progress Notes (Signed)
Patient ID: Hailey Klein, female   DOB: December 25, 1965, 49 y.o.   MRN: [id number] [facility] MD Progress Note  08/24/2015 5:19 PM Hailey Klein  MRN:  [id number] Subjective:  She reports she is feeling better than yesterday- less depressed, and  States " I think I am ready for discharge" abdominal tenderness this time denies cravings for alcohol. Denies medication side effects.  Objective : I have discussed case with treatment team  and have met with patient. Mood and affect improving compared to admission. Today presents with a significantly improved, more reactive affect, and is more future oriented . She is focusing on disposition planning and is wanting to return to [facility] in order to continue working on early recovery and relapse prevention efforts.  Of note, denies any psychotic symptoms.  Does not endorse medication side effects. No disruptive or agitated behaviors on unit, going to groups . More interactive with peers .   Principal Problem: MDD (major depressive disorder), recurrent, severe, with psychosis ([place]) Diagnosis:   Patient Active Problem List   Diagnosis Date Noted  . Major depressive disorder, recurrent ([facility]) [F33.9] 08/20/2015  . MDD (major depressive disorder), recurrent, severe, with psychosis ([place]) [F33.3] 08/20/2015   Total Time spent with patient: 20 minutes  Past Psychiatric History: MDD  Past Medical History:  Past Medical History  Diagnosis Date  . Diabetes mellitus without complication ([place])   . Hypertension   . PTSD (post-traumatic stress disorder)    History reviewed. No pertinent past surgical history. Family History: History reviewed. No pertinent family history. Father died from cancer In 01-Jan-2006. Mother died last year, " died in her sleep". Has one sister.  Family Psychiatric History- Denies Family history of mental illness or suicide attempts . She denies Father and uncle are described as alcoholic  Social History: She is currently  homeless, Unemployed . denies legal issues, no children, currently no source of income. States she has applied for disability. History  Alcohol Use  . 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 Yes  . Special: "Crack" cocaine    Comment: crack cocaine binge 1 mth ago. thc over 5 years ago.     Social History   Social History  . Marital Status: Single    Spouse Name: N/A  . Number of Children: N/A  . Years of Education: N/A   Social History Main Topics  . Smoking status: Former Research scientist (life sciences)  . Smokeless tobacco: None  . 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 Drug Use: Yes    Special: "Crack" cocaine     Comment: crack cocaine binge 1 mth ago. thc over 5 years ago.   Marland Kitchen Sexual Activity: Not Asked   Other Topics Concern  . None   Social History Narrative   Additional Social History:     Sleep: improved   Appetite:  Good  Current Medications: Current Facility-Administered Medications  Medication Dose Route Frequency Provider Last Rate Last Dose  . acetaminophen (TYLENOL) tablet 650 mg  650 mg Oral Q6H PRN Harriet Butte, NP   650 mg at 08/23/15 01/01/2111  . alum & mag hydroxide-simeth (MAALOX/MYLANTA) 200-200-20 MG/5ML suspension 30 mL  30 mL Oral Q4H PRN Harriet Butte, NP      . Derrill Memo ON 08/25/2015] DULoxetine (  CYMBALTA) DR capsule 60 mg  60 mg Oral Daily Myer Peer Cobos, MD      . gabapentin (NEURONTIN) capsule 400 mg  400 mg Oral TID Harriet Butte, NP   400 mg at 08/24/15 1715  . glipiZIDE (GLUCOTROL) tablet 5 mg  5 mg Oral BID AC Harriet Butte, NP   5 mg at 08/24/15 1715  . lisinopril (PRINIVIL,ZESTRIL) tablet 20 mg  20 mg Oral Daily Jenne Campus, MD   20 mg at 08/24/15 0806   And  . hydrochlorothiazide (HYDRODIURIL) tablet 25 mg  25 mg Oral Daily Jenne Campus, MD   25 mg at 08/24/15 1657  . magnesium hydroxide (MILK OF MAGNESIA)  suspension 30 mL  30 mL Oral Daily PRN Harriet Butte, NP      . metFORMIN (GLUCOPHAGE) tablet 1,000 mg  1,000 mg Oral BID WC Harriet Butte, NP   1,000 mg at 08/24/15 1715  . nicotine polacrilex (NICORETTE) gum 2 mg  2 mg Oral PRN Jenne Campus, MD      . risperiDONE (RISPERDAL) tablet 2 mg  2 mg Oral QHS Jenne Campus, MD   2 mg at 08/23/15 2111  . traZODone (DESYREL) tablet 150 mg  150 mg Oral QHS PRN Jenne Campus, MD        Lab Results:  Results for orders placed or performed during the hospital encounter of 08/20/15 (from the past 48 hour(s))  Glucose, capillary     Status: Abnormal   Collection Time: 08/23/15  6:06 AM  Result Value Ref Range   Glucose-Capillary 116 (H) 65 - 99 mg/dL  Glucose, capillary     Status: Abnormal   Collection Time: 08/23/15  4:49 PM  Result Value Ref Range   Glucose-Capillary 146 (H) 65 - 99 mg/dL   Comment 1 Notify RN    Comment 2 Document in Chart   Glucose, capillary     Status: Abnormal   Collection Time: 08/24/15  6:21 AM  Result Value Ref Range   Glucose-Capillary 139 (H) 65 - 99 mg/dL  Glucose, capillary     Status: Abnormal   Collection Time: 08/24/15  4:57 PM  Result Value Ref Range   Glucose-Capillary 187 (H) 65 - 99 mg/dL    Physical Findings: AIMS: Facial and Oral Movements Muscles of Facial Expression: None, normal Lips and Perioral Area: None, normal Jaw: None, normal Tongue: None, normal,Extremity Movements Upper (arms, wrists, hands, fingers): None, normal Lower (legs, knees, ankles, toes): None, normal, Trunk Movements Neck, shoulders, hips: None, normal, Overall Severity Severity of abnormal movements (highest score from questions above): None, normal Incapacitation due to abnormal movements: None, normal Patient's awareness of abnormal movements (rate only patient's report): No Awareness, Dental Status Current problems with teeth and/or dentures?: No Does patient usually wear dentures?: No  CIWA:  CIWA-Ar  Total: 1 COWS:  COWS Total Score: 2  Musculoskeletal: Strength & Muscle Tone: within normal limits Gait & Station: normal Patient leans: N/A  Psychiatric Specialty Exam: Review of Systems  Psychiatric/Behavioral: Positive for depression, suicidal ideas and hallucinations (auditory). Negative for memory loss and substance abuse. The patient is nervous/anxious and has insomnia.   All other systems reviewed and are negative. currently denies headache, denies vomiting, denies Shortness of breath or chest pain   Blood pressure 107/70, pulse 79, temperature 98.6 F (37 C), temperature source Oral, resp. rate 20, height _0  (1.676 m), weight 301 lb (136.533 kg), last menstrual period 07/21/2015.Body mass  index is 48.61 kg/(m^2).  General Appearance: improved grooming  Eye Contact::   Good   Speech:  Clear and Coherent  Volume:  Normal  Mood:    Improving , less depressed  Affect:    Reactive and appropriate today  Thought Process:  Goal Directed, Intact and Linear  Orientation:  Full (Time, Place, and Person)  Thought Content:   Denies hallucinations at this time, does not express delusional thoughts at present- does not appear internally preoccupied   Suicidal Thoughts:  No denies any current plan or intention of hurting self or of SI  Homicidal Thoughts:  No  Memory:  Immediate;   Fair Recent;   Good Remote;   Fair  Judgement:   Improving   Insight:   improving  Psychomotor Activity:  Normal  Concentration:  Good  Recall:  Good  Fund of Knowledge:Good  Language: Good  Akathisia:  Negative  Handed:  Right  AIMS (if indicated):     Assets:  Communication Skills Desire for Improvement Financial Resources/Insurance Social Support Talents/Skills Transportation  ADL's:  Intact  Cognition: WNL  Sleep:  Number of Hours: 6  Assessment - today feeling less  Depressed, and affect presents full in range, brighter. She states psychotic symptoms have completely resolved . As she  improves she is now focusing on disposition planning , consisting of returning to Rehab Setting to continue working on early recovery and relapse prevention efforts . Tolerating medications well at present.    Treatment Plan Summary: Continue inpatient treatment  Treatment team working on disposition plans - patient wanting to return to [facility] after discharge. Increase Cymbalta to 60 mgrs QAM for depression and anxiety- ( rationale for increase is that she is tolerating medication/  titration well, increasing dose to increased therapeutic dose ) Continue Neurontin 400 mgrs TID for anxiety and chronic pain Continue Risperidone 2 mgrs QHS for psychotic symptoms Continue Trazodone 100 mgrs QHS PRN for insomnia as needed  Continue Zestril / HCTZ for HTN Continue Glucophage and Glipizide for management of DM. Continue to encourage group participation , milieu participation to work on symptom reduction and coping skills    Neita Garnet MD  08/24/2015, 5:19 PM

## 2015-08-24 NOTE — Progress Notes (Signed)
Adult Psychoeducational Group Note  Date:  08/24/2015 Time:  8:35 PM  Group Topic/Focus:  Wrap-Up Group:   The focus of this group is to help patients review their daily goal of treatment and discuss progress on daily workbooks.  Pt attended AA on the [address].   Hailey Klein 08/24/2015, 8:52 PM

## 2015-08-24 NOTE — [facility] Group Notes (Signed)
The focus of this group is to educate the patient on the purpose and policies of crisis stabilization and provide a format to answer questions about their admission.  The group details unit policies and expectations of patients while admitted.  Patient attended 0900 nurse education orientation group this morning.  Patient actively participated and had appropriate affect.  Patient was alert.  Patient had appropriate insight and actively engaged in group.  Today patient will work on 3 goals for discharge.   

## 2015-08-24 NOTE — Progress Notes (Signed)
Recreation Therapy Notes  Animal-Assisted Activity (AAA) Program Checklist/Progress Notes Patient Eligibility Criteria Checklist & Daily Group note for Rec Tx Intervention  Date: 10.11.2016 Time: 2:45pm Location: [address] Dayroom    AAA/T Program Assumption of Risk Form signed by Patient/ or Parent Legal Guardian yes  Patient is free of allergies or sever asthma yes  Patient reports no fear of animals yes  Patient reports no history of cruelty to animals yes  Patient understands his/her participation is voluntary yes  Patient washes hands before animal contact yes  Patient washes hands after animal contact yes  Behavioral Response: Appropriate   Education: Hand Washing, Appropriate Animal Interaction   Education Outcome: Acknowledges education.   Clinical Observations/Feedback: Patient engaged appropriately in session, petting therapy appropriately and interacting with peers appropriately.   Hailey Klein, Hailey Klein        Hailey Klein 08/24/2015 3:09 PM 

## 2015-08-24 NOTE — [facility] Group Notes (Signed)
[facility] LCSW Group Therapy 08/24/2015 1:15 PM  Type of Therapy: Group Therapy- Feelings about Diagnosis  Participation Level: Minimal  Participation Quality:  Reserved  Affect:  Flat  Cognitive: Alert and Oriented   Insight:  Developing   Engagement in Therapy: Limited  Modes of Intervention: Clarification, Confrontation, Discussion, Education, Exploration, Limit-setting, Orientation, Problem-solving, Rapport Building, Dance movement psychotherapist, Socialization and Support  Description of Group:   This group will allow patients to explore their thoughts and feelings about diagnoses they have received. Patients will be guided to explore their level of understanding and acceptance of these diagnoses. Facilitator will encourage patients to process their thoughts and feelings about the reactions of others to their diagnosis, and will guide patients in identifying ways to discuss their diagnosis with significant others in their lives. This group will be process-oriented, with patients participating in exploration of their own experiences as well as giving and receiving support and challenge from other group members.  Summary of Progress/Problems:  Pt participates minimally in group and appears to be sleeping; was able to identify her sober living as something she is proud of when prompted to do so.  Therapeutic Modalities:   Cognitive Behavioral Therapy Solution Focused Therapy Motivational Interviewing Relapse Prevention Therapy  Chad Cordial, LCSWA 08/24/2015 5:09 PM

## 2015-08-24 NOTE — Progress Notes (Signed)
D:  Patient's self inventory sheet, patient has fair sleep, sleep medication is helpful.  Good appetite, normal energy level, good concentration.  Rated depression, hopeless and anxiety #3.  Denied withdrawals.  Denied SI.  Denied physical problems.  Pain, knee, worst pain in past 24 hours #9, pain medication is not helpful.  Unsure of goal and plans.   A:  Medications administered per MD orders.  Emotional support and encouragement given patient. R:  Denied SI and HI, contracts for safety.  Denied A/V hallucinations.  Safety maintained with 15 minute checks.

## 2015-08-25 DIAGNOSIS — F333 Major depressive disorder, recurrent, severe with psychotic symptoms: Secondary | ICD-10-CM | POA: Insufficient documentation

## 2015-08-25 LAB — GLUCOSE, CAPILLARY
GLUCOSE-CAPILLARY: 137 mg/dL — AB (ref 65–99)
GLUCOSE-CAPILLARY: 146 mg/dL — AB (ref 65–99)

## 2015-08-25 MED ORDER — DULOXETINE HCL 60 MG PO CPEP: 60.0000 mg | ORAL_CAPSULE | Freq: Every day | ORAL | Status: DC

## 2015-08-25 MED ORDER — GABAPENTIN 400 MG PO CAPS: 400.0000 mg | ORAL_CAPSULE | Freq: Three times a day (TID) | ORAL | Status: DC

## 2015-08-25 MED ORDER — HYDROCHLOROTHIAZIDE 25 MG PO TABS: 25.0000 mg | ORAL_TABLET | Freq: Every day | ORAL | Status: DC

## 2015-08-25 MED ORDER — RISPERIDONE 2 MG PO TABS: 2.0000 mg | ORAL_TABLET | Freq: Every day | ORAL | Status: DC

## 2015-08-25 MED ORDER — GLIPIZIDE 5 MG PO TABS: 5.0000 mg | ORAL_TABLET | Freq: Two times a day (BID) | ORAL | Status: DC

## 2015-08-25 MED ORDER — LISINOPRIL 20 MG PO TABS: 20.0000 mg | ORAL_TABLET | Freq: Every day | ORAL | Status: DC

## 2015-08-25 MED ORDER — METFORMIN HCL 1000 MG PO TABS: 1000.0000 mg | ORAL_TABLET | Freq: Two times a day (BID) | ORAL | Status: DC

## 2015-08-25 NOTE — [facility] Suicide Risk Assessment (Signed)
[facility] INPATIENT:  Family/Significant Other Suicide Prevention Education  Suicide Prevention Education:  Patient Refusal for Family/Significant Other Suicide Prevention Education: The patient Eeva Edwardsdid not provide written consent for family/significant other to be provided Family/Significant Other Suicide Prevention Education during admission and/or prior to discharge. She was unable to obtain sister's phone number for SPE to be completed. SPE reviewed with patient and brochure provided. Patient encouraged to return to hospital if having suicidal thoughts, patient verbalized his/her understanding and has no further questions at this time.  Physician notified.  Elaina Hoopsarter, Onita Pfluger M 08/25/2015, 10:25 AM

## 2015-08-25 NOTE — Plan of Care (Signed)
Problem: Diagnosis: Increased Risk For Suicide Attempt Goal: STG-Patient Will Attend All Groups On The Unit Outcome: Progressing Pt attends groups with active participation.      

## 2015-08-25 NOTE — Progress Notes (Signed)
Recreation Therapy Notes  Date: 10.12.2016 Time: 9:30am Location: [address] Group Room   Group Topic: Stress Management  Goal Area(s) Addresses:  Patient will actively participate in stress management techniques presented during session.   Behavioral Response: Did not attend.  Hailey Klein L Eino Whitner, LRT/CTRS        Javoni Lucken L 08/25/2015 1:09 PM 

## 2015-08-25 NOTE — Discharge Summary (Signed)
Physician Discharge Summary Note  Patient:  Hailey Klein is an 49 y.o., female MRN:  [id number] DOB:  [birth year] Patient phone:  [phone] (home)  Patient address:   [address]  [place] [address],  Total Time spent with patient: 30 minutes  Date of Admission:  08/20/2015 Date of Discharge: 08/25/2015  Reason for Admission:  depression  Principal Problem: MDD (major depressive disorder), recurrent, severe, with psychosis [facility]) Discharge Diagnoses: Patient Active Problem List   Diagnosis Date Noted  . Severe episode of recurrent major depressive disorder, with psychotic features ([facility]) [F33.3]   . Major depressive disorder, recurrent ([facility]) [F33.9] 08/20/2015  . MDD (major depressive disorder), recurrent, severe, with psychosis ([facility]) [F33.3] 08/20/2015    Musculoskeletal: Strength & Muscle Tone: within normal limits Gait & Station: normal Patient leans: N/A  Psychiatric Specialty Exam:  SEE MD SRA Physical Exam  Vitals reviewed. Psychiatric: Her mood appears not anxious. Thought content is not paranoid. Hailey does not exhibit a depressed mood. Hailey expresses no homicidal and no suicidal ideation.    Review of Systems  All other systems reviewed and are negative.   Blood pressure 104/59, pulse 89, temperature 98.9 F (37.2 C), temperature source Oral, resp. rate 20, height  (1.676 m), weight 136.533 kg (301 lb), last menstrual period 07/21/2015.Body mass index is 48.61 kg/(m^2).  Have you used any form of tobacco in the last 30 days? (Cigarettes, Smokeless Tobacco, Cigars, and/or Pipes): No  Has this patient used any form of tobacco in the last 30 days? (Cigarettes, Smokeless Tobacco, Cigars, and/or Pipes) N/A  Past Medical History:  Past Medical History  Diagnosis Date  . Diabetes mellitus without complication ([facility])   . Hypertension   . PTSD (post-traumatic stress disorder)    History reviewed. No pertinent past surgical history. Family History: History  reviewed. No pertinent family history. Social History:  History  Alcohol Use  . 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 Yes  . Special: "Crack" cocaine    Comment: crack cocaine binge 1 mth ago. thc over 5 years ago.     Social History   Social History  . Marital Status: Single    Spouse Name: N/A  . Number of Children: N/A  . Years of Education: N/A   Social History Main Topics  . Smoking status: Former Games developer  . Smokeless tobacco: None  . 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 Drug Use: Yes    Special: "Crack" cocaine     Comment: crack cocaine binge 1 mth ago. thc over 5 years ago.   Marland Kitchen Sexual Activity: Not Asked   Other Topics Concern  . None   Social History Narrative   Risk to Self: Is patient at risk for suicide?: Yes What has been your use of drugs/alcohol within the last 12 months?: sober for 38 days; prior to this was using ETOH daily- a case of beer and 1/5 gin Risk to Others:   Prior Inpatient Therapy:   Prior Outpatient Therapy:    Level of Care:  OP  Hospital Course:  Hailey Klein was admitted for MDD (major depressive disorder), recurrent, severe, with psychosis ([facility]) and crisis management.   Hailey was treated discharged with the medications listed below under Medication List.  Medical problems were identified and treated as needed.  Home medications were restarted as appropriate.  Improvement was monitored by observation and Hailey Klein daily report of symptom reduction.  Emotional and mental status was monitored by daily self-inventory reports completed by Hailey Klein and clinical staff.         Hailey Klein was evaluated by the treatment team for stability and plans for continued recovery upon discharge.  Hailey Klein motivation was an integral factor for scheduling further treatment.  Employment,  transportation, bed availability, health status, family support, and any pending legal issues were also considered during her hospital stay.  Hailey was offered further treatment options upon discharge including but not limited to Residential, Intensive Outpatient, and Outpatient treatment.  Hailey Klein will follow up with the services as listed below under Follow Up Information.     Upon completion of this admission the patient was both mentally and medically stable for discharge denying suicidal/homicidal ideation, auditory/visual/tactile hallucinations, delusional thoughts and paranoia.      Consults:  psychiatry  Significant Diagnostic Studies:  labs: per ED  Discharge Vitals:   Blood pressure 104/59, pulse 89, temperature 98.9 F (37.2 C), temperature source Oral, resp. rate 20, height  (1.676 m), weight 136.533 kg (301 lb), last menstrual period 07/21/2015. Body mass index is 48.61 kg/(m^2). Lab Results:   Results for orders placed or performed during the hospital encounter of 08/20/15 (from the past 72 hour(s))  Glucose, capillary     Status: Abnormal   Collection Time: 08/22/15  4:53 PM  Result Value Ref Range   Glucose-Capillary 144 (H) 65 - 99 mg/dL   Comment 1 Notify RN    Comment 2 Document in Chart   Glucose, capillary     Status: Abnormal   Collection Time: 08/23/15  6:06 AM  Result Value Ref Range   Glucose-Capillary 116 (H) 65 - 99 mg/dL  Glucose, capillary     Status: Abnormal   Collection Time: 08/23/15  4:49 PM  Result Value Ref Range   Glucose-Capillary 146 (H) 65 - 99 mg/dL   Comment 1 Notify RN    Comment 2 Document in Chart   Glucose, capillary     Status: Abnormal   Collection Time: 08/24/15  6:21 AM  Result Value Ref Range   Glucose-Capillary 139 (H) 65 - 99 mg/dL  Glucose, capillary     Status: Abnormal   Collection Time: 08/24/15  4:57 PM  Result Value Ref Range   Glucose-Capillary 187 (H) 65 - 99 mg/dL  Glucose, capillary     Status: Abnormal    Collection Time: 08/25/15  6:33 AM  Result Value Ref Range   Glucose-Capillary 137 (H) 65 - 99 mg/dL    Physical Findings: AIMS: Facial and Oral Movements Muscles of Facial Expression: None, normal Lips and Perioral Area: None, normal Jaw: None, normal Tongue: None, normal,Extremity Movements Upper (arms, wrists, hands, fingers): None, normal Lower (legs, knees, ankles, toes): None, normal, Trunk Movements Neck, shoulders, hips: None, normal, Overall Severity Severity of abnormal movements (highest score from questions above): None, normal Incapacitation due to abnormal movements: None, normal Patient's awareness of abnormal movements (rate only patient's report): No Awareness, Dental Status Current problems with teeth and/or dentures?: No Does patient usually wear dentures?: No  CIWA:  CIWA-Ar Total: 1 COWS:  COWS Total Score: 2   See Psychiatric Specialty Exam and Suicide Risk Assessment completed by Attending Physician prior to discharge.  Discharge destination:  Home  Is patient on multiple  antipsychotic therapies at discharge:  No   Has Patient had three or more failed trials of antipsychotic monotherapy by history:  No    Recommended Plan for Multiple Antipsychotic Therapies: NA     Medication List    STOP taking these medications        haloperidol 2 MG tablet  Commonly known as:  HALDOL     nicotine polacrilex 2 MG gum  Commonly known as:  NICORETTE     PARoxetine 30 MG tablet  Commonly known as:  PAXIL     pseudoephedrine 120 MG 12 hr tablet  Commonly known as:  SUDAFED 12 HOUR     quinapril-hydrochlorothiazide 20-25 MG tablet  Commonly known as:  ACCURETIC     sitaGLIPtin 100 MG tablet  Commonly known as:  JANUVIA     traZODone 100 MG tablet  Commonly known as:  DESYREL      TAKE these medications      Indication   DULoxetine 60 MG capsule  Commonly known as:  CYMBALTA  Take 1 capsule (60 mg total) by mouth daily.   Indication:  Major  Depressive Disorder     gabapentin 400 MG capsule  Commonly known as:  NEURONTIN  Take 1 capsule (400 mg total) by mouth 3 (three) times daily.   Indication:  Aggressive Behavior, Neuropathic Pain     glipiZIDE 5 MG tablet  Commonly known as:  GLUCOTROL  Take 1 tablet (5 mg total) by mouth 2 (two) times daily before a meal. Before breakfast and dinner. Resume regimen as you have at home.   Indication:  Type 2 Diabetes     hydrochlorothiazide 25 MG tablet  Commonly known as:  HYDRODIURIL  Take 1 tablet (25 mg total) by mouth daily.   Indication:  High Blood Pressure     lisinopril 20 MG tablet  Commonly known as:  PRINIVIL,ZESTRIL  Take 1 tablet (20 mg total) by mouth daily.   Indication:  High Blood Pressure     metFORMIN 1000 MG tablet  Commonly known as:  GLUCOPHAGE  Take 1 tablet (1,000 mg total) by mouth 2 (two) times daily with a meal.   Indication:  Type 2 Diabetes     risperiDONE 2 MG tablet  Commonly known as:  RISPERDAL  Take 1 tablet (2 mg total) by mouth at bedtime.   Indication:  Major Depressive Disorder           Follow-up Information    Follow up with [facility] On 08/25/2015.   Why:  Return to [facility] to complete your substance abuse treatment.   Contact information:   [address] [place], [place] [address] Phone: [phone]      Follow-up recommendations:  Activity:  as tol Diet:  as tol  Comments:  1.  Take all your medications as prescribed.              2.  Report any adverse side effects to outpatient provider.                       3.  Patient instructed to not use alcohol or illegal drugs while on prescription medicines.            4.  In the event of worsening symptoms, instructed patient to call 911, the crisis hotline or go to nearest emergency room for evaluation of symptoms.  Total Discharge Time:  30 min  Signed: Velna Hatchet May Agustin AGNP-BC  08/25/2015, 12:58 PM  Patient seen, Suicide Assessment  Completed.  Disposition Plan Reviewed

## 2015-08-25 NOTE — BHH Group Notes (Signed)
[facility] Dis[facility] Note  08/25/2015 8:45 AM  Participation Quality: Alert, Appropriate and Oriented  Mood/Affect: Appropriate  Depression Rating: 1  Anxiety Rating: 10  Thoughts of Suicide: Pt denies SI/HI  Will you contract for safety? Yes  Current AVH: Pt denies  Plan for Discharge/Comments: Pt attended discharge planning group and actively participated in group. CSW discussed suicide prevention education with the group and encouraged them to discuss discharge planning and any relevant barriers. Pt reports feeling better this morning, anxious about discharge but describes feeling ready to go [facility].  Transportation Means: Pt reports access to transportation  Supports: No supports mentioned at this time  Chad CordialLauren Carter, LCSWA 08/25/2015 9:31 AM

## 2015-08-25 NOTE — BHH Suicide Risk Assessment (Signed)
[facility] Discharge Suicide Risk Assessment   Demographic Factors:  49 year old female,  No children , currently homeless ,   Total Time spent with patient: 30 minutes  Musculoskeletal: Strength & Muscle Tone: within normal limits Gait & Station: normal Patient leans: N/A  Psychiatric Specialty Exam: Physical Exam  ROS  Blood pressure 104/59, pulse 89, temperature 98.9 F (37.2 C), temperature source Oral, resp. rate 20, height  (1.676 m), weight 301 lb (136.533 kg), last menstrual period 07/21/2015.Body mass index is 48.61 kg/(m^2).  General Appearance: Well Groomed  Patent attorney::  Good  Speech:  Normal Rate409  Volume:  Normal  Mood:  Euthymic  Affect:  Appropriate and more reactive  Thought Process:  Linear  Orientation:  Full (Time, Place, and Person)  Thought Content:  at this time denies any further hallucinations, no delusions, not internally preoccupied   Suicidal Thoughts:  No  Homicidal Thoughts:  No  Memory:  recent and remote grossly intact   Judgement:  Other:  improved   Insight:  Present  Psychomotor Activity:  Normal  Concentration:  Good  Recall:  Good  Fund of Knowledge:Good  Language: Good  Akathisia:  Negative  Handed:  Right  AIMS (if indicated):     Assets:  Communication Skills Desire for Improvement Resilience  Sleep:  Number of Hours: 6.75  Cognition: WNL  ADL's:  Intact   Have you used any form of tobacco in the last 30 days? (Cigarettes, Smokeless Tobacco, Cigars, and/or Pipes): No  Has this patient used any form of tobacco in the last 30 days? (Cigarettes, Smokeless Tobacco, Cigars, and/or Pipes) No  Mental Status Per Nursing Assessment::   On Admission:     Current Mental Status by Physician: At this time patient is improved compared to her admission presentation- her grooming is improved, her mood is better and currently denies depression, her affect is brighter, no thought disorder, no SI, no HI, auditory hallucinations have  completely resolved and she does not appear internally preoccupied , no delusions , she is future oriented and looking forward to returning to [facility].   Loss Factors: Homelessness, history of alcohol dependence .   Historical Factors: History of depression, history of auditory hallucinations , history of alcohol dependence .   Risk Reduction Factors:   Sense of responsibility to family and Positive coping skills or problem solving skills  Continued Clinical Symptoms:  At this time patient is improved compared to admission, and currently denies depression, denies any SI or HI, no psychotic symptoms.  Cognitive Features That Contribute To Risk:  No gross cognitive deficits noted upon discharge. Is alert , attentive, and oriented x 3   Suicide Risk:  Mild:  Suicidal ideation of limited frequency, intensity, duration, and specificity.  There are no identifiable plans, no associated intent, mild dysphoria and related symptoms, good self-control (both objective and subjective assessment), few other risk factors, and identifiable protective factors, including available and accessible social support.  Principal Problem: MDD (major depressive disorder), recurrent, severe, with psychosis [facility]) Discharge Diagnoses:  Patient Active Problem List   Diagnosis Date Noted  . Major depressive disorder, recurrent (HCC) [F33.9] 08/20/2015  . MDD (major depressive disorder), recurrent, severe, with psychosis (HCC) [F33.3] 08/20/2015    Follow-up Information    Follow up with [facility] On 08/25/2015.   Why:  Return to [facility] to complete your substance abuse treatment.   Contact information:   [address], [place] [address] Phone: [phone])  [phone]      Plan Of Care/Follow-up recommendations:  Activity:  as tolerated  Diet:  Diabetic Diet  Tests:  NA Other:  see below   Is patient on multiple antipsychotic therapies at discharge:  No   Has Patient had  three or more failed trials of antipsychotic monotherapy by history:  No  Recommended Plan for Multiple Antipsychotic Therapies: NA  Patient is leaving in good spirits . Planning to [facility] after discharge . States she [facility] [place] for  Diabetes management .   Jermya Dowding 08/25/2015, 10:59 AM

## 2015-08-25 NOTE — Progress Notes (Signed)
D/C note: Pt. D/C from the unit to lobby accompanied by group home staff.  She was pleasant and cooperative. She voiced no SI/HI or A/V halluciations. She denies any pain or discomfort.  D/C instructions and medications verified with another nurse reviewed with pt.  She verbalized understanding of medications and d/c instructions.  Hailey Klein didn't have any belongings in lockers.  Q 15 min checks maintained until discharge.  Pt. Left the unit in no apparent distress.

## 2015-08-25 NOTE — Progress Notes (Signed)
Hailey Lex.C. From Hailey Klein, SW, [facility].  He is concerned about her discharge medications being different from her admission medications.  He has orders for her old medications as well as her new discharge medications from today.  Instructed him to call her current medical provider to get the d/c orders.  He stated that he is going to hold the old medications tonight and then call provider tomorrow.  He will call back with further problems.

## 2015-08-25 NOTE — Progress Notes (Signed)
  [facility] Discharge Plan :  Will you be returning to the same living situation after discharge:  Yes,  Pt [facility] At discharge, do you have transportation home?: Yes,  [facility] to provide transportation Do you have the ability to pay for your medications: Yes,  Pt provided with prescriptions  Release of information consent forms completed and in the chart;  Patient's signature needed at discharge.  Patient to Follow up at: Follow-up Information    Follow up with [facility] On 08/25/2015.   Why:  Return [facility] to complete your substance abuse treatment.   Contact information:   [address] [place], [place] [address] Phone: [phone]      Patient denies SI/HI: Yes,  Pt denies    Safety Planning and Suicide Prevention discussed: Yes,  with Pt; Pt unable to provide sister's contact information  Have you used any form of tobacco in the last 30 days? (Cigarettes, Smokeless Tobacco, Cigars, and/or Pipes): No  Has patient been referred to the Quitline?: N/A patient is not a smoker  Elaina HoopsCarter, Jamariya Davidoff M 08/25/2015, 10:27 AM

## 2015-08-25 NOTE — Progress Notes (Signed)
D: Pt is bright in affect and pleasant in mood. Pt reports readiness to d/c to [facility] to continue her rehab program. Pt is active and visible within the milieu. Pt observed interacting with others appropriately. Pt denies any SI/HI/AVH. A: Writer administered scheduled and prn medications to pt, per MD orders. Continued support and availability as needed was extended to this pt. Staff continue to monitor pt with q3815min checks.  R: No adverse drug reactions noted. Pt receptive to treatment. Pt remains safe at this time.

## 2015-08-25 NOTE — Plan of Care (Signed)
Problem: Diagnosis: Increased Risk For Suicide Attempt Goal: LTG-Patient Will Report Improved Mood and Deny Suicidal LTG (by discharge) Patient will report improved mood and deny suicidal ideation.  D: Pt reports an improved mood. Pt denies any SI. Pt verbally contracts for safety.

## 2015-08-25 NOTE — Progress Notes (Signed)
D:  Patient's self inventory sheet, patient sleeps good, no sleep medication given.  Good appetite, high energy level, good concentration.  Rated depression, hopeless and anxiety 1.  Denied withdrawals.  Denied SI.  Denied physical problems.  Pain, L knee, pain medication is helpful.  Not sure of goal or plans.  Discharge today.  No problems anticipated after discharge. A:  Medications administered per MD orders.  Emotional support and encouragement given patient. R:  Denied SI and HI, contracts for safety.  Denied A/V hallucinations.  Safety maintained with 15 minute checks.

## 2015-11-29 ENCOUNTER — Emergency Department (HOSPITAL_COMMUNITY)
Admission: EM | Admit: 2015-11-29 | Discharge: 2015-11-30 | Disposition: A | Discharge status: Other Acute Inpatient Hospital | Payer: Self-pay | Attending: Emergency Medicine | Admitting: Emergency Medicine

## 2015-11-29 DIAGNOSIS — F4310 Post-traumatic stress disorder, unspecified: Secondary | ICD-10-CM | POA: Insufficient documentation

## 2015-11-29 DIAGNOSIS — Z7984 Long term (current) use of oral hypoglycemic drugs: Secondary | ICD-10-CM | POA: Insufficient documentation

## 2015-11-29 DIAGNOSIS — Z79899 Other long term (current) drug therapy: Secondary | ICD-10-CM | POA: Insufficient documentation

## 2015-11-29 DIAGNOSIS — E119 Type 2 diabetes mellitus without complications: Secondary | ICD-10-CM | POA: Insufficient documentation

## 2015-11-29 DIAGNOSIS — I10 Essential (primary) hypertension: Secondary | ICD-10-CM | POA: Insufficient documentation

## 2015-11-29 DIAGNOSIS — Z3202 Encounter for pregnancy test, result negative: Secondary | ICD-10-CM | POA: Insufficient documentation

## 2015-11-29 DIAGNOSIS — R45851 Suicidal ideations: Secondary | ICD-10-CM

## 2015-11-29 HISTORY — DX: Personal history of suicidal behavior: Z91.51

## 2015-11-29 HISTORY — DX: Personal history of self-harm: Z91.5

## 2015-11-29 LAB — CBC WITH DIFFERENTIAL/PLATELET
BASOS ABS: 0 10*3/uL (ref 0.0–0.1)
BASOS PCT: 1 %
Eosinophils Absolute: 0.2 10*3/uL (ref 0.0–0.7)
Eosinophils Relative: 2 %
HEMATOCRIT: 32.6 % — AB (ref 36.0–46.0)
HEMOGLOBIN: 10.5 g/dL — AB (ref 12.0–15.0)
LYMPHS PCT: 31 %
Lymphs Abs: 2.2 10*3/uL (ref 0.7–4.0)
MCH: 29 pg (ref 26.0–34.0)
MCHC: 32.2 g/dL (ref 30.0–36.0)
MCV: 90.1 fL (ref 78.0–100.0)
Monocytes Absolute: 0.5 10*3/uL (ref 0.1–1.0)
Monocytes Relative: 7 %
NEUTROS ABS: 4.4 10*3/uL (ref 1.7–7.7)
Neutrophils Relative %: 59 %
Platelets: 325 10*3/uL (ref 150–400)
RBC: 3.62 MIL/uL — AB (ref 3.87–5.11)
RDW: 12.8 % (ref 11.5–15.5)
WBC: 7.3 10*3/uL (ref 4.0–10.5)

## 2015-11-29 LAB — RAPID URINE DRUG SCREEN, HOSP PERFORMED
AMPHETAMINES: NOT DETECTED
BARBITURATES: NOT DETECTED
BENZODIAZEPINES: NOT DETECTED
COCAINE: NOT DETECTED
Opiates: NOT DETECTED
Tetrahydrocannabinol: NOT DETECTED

## 2015-11-29 LAB — SALICYLATE LEVEL

## 2015-11-29 LAB — COMPREHENSIVE METABOLIC PANEL
ALT: 12 U/L — ABNORMAL LOW (ref 14–54)
AST: 13 U/L — AB (ref 15–41)
Albumin: 3.7 g/dL (ref 3.5–5.0)
Alkaline Phosphatase: 71 U/L (ref 38–126)
Anion gap: 12 (ref 5–15)
BILIRUBIN TOTAL: 0.3 mg/dL (ref 0.3–1.2)
BUN: 20 mg/dL (ref 6–20)
CO2: 25 mmol/L (ref 22–32)
CREATININE: 1.17 mg/dL — AB (ref 0.44–1.00)
Calcium: 9.7 mg/dL (ref 8.9–10.3)
Chloride: 100 mmol/L — ABNORMAL LOW (ref 101–111)
GFR, EST NON AFRICAN AMERICAN: 55 mL/min — AB (ref >60)
Glucose, Bld: 110 mg/dL — ABNORMAL HIGH (ref 65–99)
POTASSIUM: 4.6 mmol/L (ref 3.5–5.1)
Sodium: 137 mmol/L (ref 135–145)
TOTAL PROTEIN: 7 g/dL (ref 6.5–8.1)

## 2015-11-29 LAB — ETHANOL

## 2015-11-29 LAB — ACETAMINOPHEN LEVEL: Acetaminophen (Tylenol), Serum: <10 ug/mL — ABNORMAL LOW (ref 10–30)

## 2015-11-29 NOTE — ED Notes (Signed)
MD at bedside. 
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
## 2015-11-30 ENCOUNTER — Inpatient Hospital Stay (HOSPITAL_COMMUNITY)
Admission: AD | Admit: 2015-11-30 | Discharge: 2015-12-09 | DRG: 885 | Disposition: A | Discharge status: Home / Self Care | Payer: Federal, State, Local not specified - Other | Source: Intra-hospital | Attending: Emergency Medicine | Admitting: Emergency Medicine

## 2015-11-30 ENCOUNTER — Encounter (HOSPITAL_COMMUNITY): Payer: Self-pay | Admitting: *Deleted

## 2015-11-30 DIAGNOSIS — F1021 Alcohol dependence, in remission: Secondary | ICD-10-CM | POA: Diagnosis not present

## 2015-11-30 DIAGNOSIS — E119 Type 2 diabetes mellitus without complications: Secondary | ICD-10-CM | POA: Diagnosis present

## 2015-11-30 DIAGNOSIS — R079 Chest pain, unspecified: Secondary | ICD-10-CM | POA: Diagnosis present

## 2015-11-30 DIAGNOSIS — R45850 Homicidal ideations: Secondary | ICD-10-CM | POA: Diagnosis present

## 2015-11-30 DIAGNOSIS — G4700 Insomnia, unspecified: Secondary | ICD-10-CM | POA: Diagnosis present

## 2015-11-30 DIAGNOSIS — R45851 Suicidal ideations: Secondary | ICD-10-CM | POA: Diagnosis not present

## 2015-11-30 DIAGNOSIS — Z590 Homelessness: Secondary | ICD-10-CM | POA: Diagnosis not present

## 2015-11-30 DIAGNOSIS — F1410 Cocaine abuse, uncomplicated: Secondary | ICD-10-CM | POA: Clinically undetermined

## 2015-11-30 DIAGNOSIS — I10 Essential (primary) hypertension: Secondary | ICD-10-CM | POA: Diagnosis present

## 2015-11-30 DIAGNOSIS — F333 Major depressive disorder, recurrent, severe with psychotic symptoms: Secondary | ICD-10-CM | POA: Diagnosis not present

## 2015-11-30 DIAGNOSIS — F332 Major depressive disorder, recurrent severe without psychotic features: Secondary | ICD-10-CM | POA: Diagnosis present

## 2015-11-30 DIAGNOSIS — F339 Major depressive disorder, recurrent, unspecified: Secondary | ICD-10-CM | POA: Diagnosis present

## 2015-11-30 DIAGNOSIS — F17210 Nicotine dependence, cigarettes, uncomplicated: Secondary | ICD-10-CM | POA: Diagnosis present

## 2015-11-30 DIAGNOSIS — F1210 Cannabis abuse, uncomplicated: Secondary | ICD-10-CM | POA: Diagnosis present

## 2015-11-30 DIAGNOSIS — F1421 Cocaine dependence, in remission: Secondary | ICD-10-CM | POA: Clinically undetermined

## 2015-11-30 DIAGNOSIS — F4310 Post-traumatic stress disorder, unspecified: Secondary | ICD-10-CM | POA: Diagnosis present

## 2015-11-30 DIAGNOSIS — F1121 Opioid dependence, in remission: Secondary | ICD-10-CM | POA: Clinically undetermined

## 2015-11-30 MED ORDER — HYDROXYZINE HCL 25 MG PO TABS
25.0000 mg | ORAL_TABLET | Freq: Four times a day (QID) | ORAL | Status: DC | PRN
  Administered 2015-11-30 – 2015-12-09 (×20): 25 mg via ORAL
  Filled 2015-11-30 (×21): qty 1
  Filled 2015-11-30: qty 6

## 2015-11-30 MED ORDER — ALUM & MAG HYDROXIDE-SIMETH 200-200-20 MG/5ML PO SUSP: 30.0000 mL | ORAL | Status: DC | PRN

## 2015-11-30 MED ORDER — DULOXETINE HCL 60 MG PO CPEP
60.0000 mg | ORAL_CAPSULE | Freq: Every day | ORAL | Status: DC
  Administered 2015-12-01: 60 mg via ORAL
  Filled 2015-11-30 (×3): qty 1

## 2015-11-30 MED ORDER — LORAZEPAM 1 MG PO TABS
1.0000 mg | ORAL_TABLET | Freq: Three times a day (TID) | ORAL | Status: DC | PRN
  Administered 2015-11-30: 1 mg via ORAL
  Filled 2015-11-30: qty 1

## 2015-11-30 MED ORDER — DULOXETINE HCL 60 MG PO CPEP
60.0000 mg | ORAL_CAPSULE | Freq: Every day | ORAL | Status: DC
  Administered 2015-11-30: 60 mg via ORAL
  Filled 2015-11-30: qty 1

## 2015-11-30 MED ORDER — GLIPIZIDE 5 MG PO TABS
5.0000 mg | ORAL_TABLET | Freq: Two times a day (BID) | ORAL | Status: DC
  Administered 2015-11-30 – 2015-12-09 (×18): 5 mg via ORAL
  Filled 2015-11-30 (×27): qty 1

## 2015-11-30 MED ORDER — LISINOPRIL 20 MG PO TABS
20.0000 mg | ORAL_TABLET | Freq: Every day | ORAL | Status: DC
  Administered 2015-12-02 – 2015-12-09 (×5): 20 mg via ORAL
  Filled 2015-11-30 (×13): qty 1

## 2015-11-30 MED ORDER — GABAPENTIN 400 MG PO CAPS
400.0000 mg | ORAL_CAPSULE | Freq: Three times a day (TID) | ORAL | Status: DC
  Administered 2015-11-30 – 2015-12-09 (×26): 400 mg via ORAL
  Filled 2015-11-30 (×23): qty 1
  Filled 2015-11-30 (×2): qty 9
  Filled 2015-11-30: qty 1
  Filled 2015-11-30: qty 9
  Filled 2015-11-30 (×9): qty 1

## 2015-11-30 MED ORDER — LISINOPRIL 20 MG PO TABS
20.0000 mg | ORAL_TABLET | Freq: Every day | ORAL | Status: DC
  Administered 2015-11-30: 20 mg via ORAL
  Filled 2015-11-30: qty 1

## 2015-11-30 MED ORDER — MAGNESIUM HYDROXIDE 400 MG/5ML PO SUSP
30.0000 mL | Freq: Every day | ORAL | Status: DC | PRN
  Administered 2015-12-02 – 2015-12-03 (×2): 30 mL via ORAL
  Filled 2015-11-30 (×2): qty 30

## 2015-11-30 MED ORDER — GLIPIZIDE 5 MG PO TABS
5.0000 mg | ORAL_TABLET | Freq: Two times a day (BID) | ORAL | Status: DC
  Administered 2015-11-30: 5 mg via ORAL
  Filled 2015-11-30 (×3): qty 1

## 2015-11-30 MED ORDER — GABAPENTIN 400 MG PO CAPS
400.0000 mg | ORAL_CAPSULE | Freq: Three times a day (TID) | ORAL | Status: DC
  Administered 2015-11-30: 400 mg via ORAL
  Filled 2015-11-30: qty 1

## 2015-11-30 MED ORDER — LORAZEPAM 1 MG PO TABS: 1.0000 mg | ORAL_TABLET | ORAL | Status: DC | PRN

## 2015-11-30 MED ORDER — ZIPRASIDONE MESYLATE 20 MG IM SOLR: 20.0000 mg | INTRAMUSCULAR | Status: DC | PRN

## 2015-11-30 MED ORDER — ACETAMINOPHEN 325 MG PO TABS: 650.0000 mg | ORAL_TABLET | ORAL | Status: DC | PRN

## 2015-11-30 MED ORDER — METFORMIN HCL 500 MG PO TABS
1000.0000 mg | ORAL_TABLET | Freq: Two times a day (BID) | ORAL | Status: DC
  Administered 2015-11-30: 1000 mg via ORAL
  Filled 2015-11-30: qty 2

## 2015-11-30 MED ORDER — ACETAMINOPHEN 325 MG PO TABS
650.0000 mg | ORAL_TABLET | Freq: Four times a day (QID) | ORAL | Status: DC | PRN
Start: 2015-11-30 — End: 2015-12-09
  Administered 2015-11-30 – 2015-12-08 (×9): 650 mg via ORAL
  Filled 2015-11-30 (×9): qty 2

## 2015-11-30 MED ORDER — RISPERIDONE 2 MG PO TBDP
2.0000 mg | ORAL_TABLET | Freq: Three times a day (TID) | ORAL | Status: DC | PRN
  Administered 2015-11-30: 2 mg via ORAL
  Filled 2015-11-30: qty 2
  Filled 2015-11-30: qty 1

## 2015-11-30 MED ORDER — TRAZODONE HCL 50 MG PO TABS: 50.0000 mg | ORAL_TABLET | Freq: Every evening | ORAL | Status: DC | PRN

## 2015-11-30 MED ORDER — HYDROCHLOROTHIAZIDE 25 MG PO TABS
25.0000 mg | ORAL_TABLET | Freq: Every day | ORAL | Status: DC
  Administered 2015-11-30: 25 mg via ORAL
  Filled 2015-11-30: qty 1

## 2015-11-30 MED ORDER — HYDROCHLOROTHIAZIDE 25 MG PO TABS
25.0000 mg | ORAL_TABLET | Freq: Every day | ORAL | Status: DC
Start: 2015-12-01 — End: 2015-12-09
  Administered 2015-12-01 – 2015-12-09 (×9): 25 mg via ORAL
  Filled 2015-11-30 (×12): qty 1
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
## 2015-11-30 NOTE — ED Notes (Addendum)
Patient CBG 143.

## 2015-11-30 NOTE — Tx Team (Addendum)
Initial Interdisciplinary Treatment Plan   PATIENT STRESSORS: Financial difficulties Loss of living arrangement Occupational concerns   PATIENT STRENGTHS: Average or above average intelligence Communication skills   PROBLEM LIST: Problem List/Patient Goals Date to be addressed Date deferred Reason deferred Estimated date of resolution  Depression 11/30/2015     Suicidal Ideation 11/30/2015     Psychosis 11/30/2015     " Loss of living arrangement" 11/30/2015                                    DISCHARGE CRITERIA:  Improved stabilization in mood, thinking, and/or behavior Need for constant or close observation no longer present Verbal commitment to aftercare and medication compliance  PRELIMINARY DISCHARGE PLAN: Attend 12-step recovery group Placement in alternative living arrangements  PATIENT/FAMIILY INVOLVEMENT: This treatment plan has been presented to and reviewed with the patient, Hailey Klein.  The patient and family have been given the opportunity to ask questions and make suggestions.  Hailey Klein 11/30/2015, 2:39 PM
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
## 2015-11-30 NOTE — Progress Notes (Signed)
Per [facility], pt has been accepted to [facility] bed 501-2 by Dr. Elna Breslow. Admission is voluntary.   Ilean Skill, MSW, LCSW Clinical Social Work, Disposition  11/30/2015 [phone]

## 2015-11-30 NOTE — ED Notes (Signed)
TTS in process 

## 2015-11-30 NOTE — ED Notes (Signed)
Ativan given as requested d/t states AH are getting worse. Pt noted to be calm, cooperative.

## 2015-11-30 NOTE — ED Notes (Signed)
Patient was given a snack and drink. A regular diet order was taken for lunch.

## 2015-11-30 NOTE — Progress Notes (Signed)
Pt did not attend wrap-up group   
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
## 2015-11-30 NOTE — ED Notes (Signed)
Per Meghan, [facility] - pt accepted to [facility] - [id number] - Dr Elna Breslow.

## 2015-12-01 ENCOUNTER — Inpatient Hospital Stay (HOSPITAL_COMMUNITY): Payer: Federal, State, Local not specified - Other

## 2015-12-01 ENCOUNTER — Encounter (HOSPITAL_COMMUNITY): Payer: Self-pay | Admitting: Psychiatry

## 2015-12-01 DIAGNOSIS — F333 Major depressive disorder, recurrent, severe with psychotic symptoms: Secondary | ICD-10-CM | POA: Diagnosis present

## 2015-12-01 DIAGNOSIS — F4310 Post-traumatic stress disorder, unspecified: Secondary | ICD-10-CM

## 2015-12-01 DIAGNOSIS — F339 Major depressive disorder, recurrent, unspecified: Secondary | ICD-10-CM | POA: Diagnosis present

## 2015-12-01 DIAGNOSIS — F332 Major depressive disorder, recurrent severe without psychotic features: Secondary | ICD-10-CM

## 2015-12-01 LAB — CBC
HCT: 31 % — ABNORMAL LOW (ref 36.0–46.0)
Hemoglobin: 10 g/dL — ABNORMAL LOW (ref 12.0–15.0)
MCH: 28.9 pg (ref 26.0–34.0)
MCHC: 32.3 g/dL (ref 30.0–36.0)
MCV: 89.6 fL (ref 78.0–100.0)
PLATELETS: 315 10*3/uL (ref 150–400)
RBC: 3.46 MIL/uL — ABNORMAL LOW (ref 3.87–5.11)
RDW: 12.8 % (ref 11.5–15.5)
WBC: 6.6 10*3/uL (ref 4.0–10.5)

## 2015-12-01 LAB — BASIC METABOLIC PANEL
Anion gap: 12 (ref 5–15)
BUN: 25 mg/dL — AB (ref 6–20)
CHLORIDE: 98 mmol/L — AB (ref 101–111)
CO2: 26 mmol/L (ref 22–32)
CREATININE: 1.38 mg/dL — AB (ref 0.44–1.00)
Calcium: 9.3 mg/dL (ref 8.9–10.3)
GFR calc Af Amer: 52 mL/min — ABNORMAL LOW (ref >60)
GFR calc non Af Amer: 45 mL/min — ABNORMAL LOW (ref >60)
Glucose, Bld: 120 mg/dL — ABNORMAL HIGH (ref 65–99)
Potassium: 4.8 mmol/L (ref 3.5–5.1)
SODIUM: 136 mmol/L (ref 135–145)

## 2015-12-01 LAB — LIPID PANEL
Cholesterol: 171 mg/dL (ref 0–200)
HDL: 45 mg/dL (ref >40)
LDL CALC: 111 mg/dL — AB (ref 0–99)
Total CHOL/HDL Ratio: 3.8 RATIO
Triglycerides: 73 mg/dL (ref <150)
VLDL: 15 mg/dL (ref 0–40)

## 2015-12-01 LAB — TSH: TSH: 2.446 u[IU]/mL (ref 0.350–4.500)

## 2015-12-01 LAB — I-STAT TROPONIN, ED: Troponin i, poc: 0 ng/mL (ref 0.00–0.08)

## 2015-12-01 LAB — PREGNANCY, URINE: Preg Test, Ur: NEGATIVE

## 2015-12-01 LAB — T4, FREE: Free T4: 0.86 ng/dL (ref 0.61–1.12)

## 2015-12-01 MED ORDER — TRAZODONE HCL 100 MG PO TABS
100.0000 mg | ORAL_TABLET | Freq: Every day | ORAL | Status: DC
  Administered 2015-12-02 – 2015-12-08 (×8): 100 mg via ORAL
  Filled 2015-12-01 (×12): qty 1
  Filled 2015-12-01: qty 3

## 2015-12-01 MED ORDER — DULOXETINE HCL 60 MG PO CPEP
60.0000 mg | ORAL_CAPSULE | Freq: Every evening | ORAL | Status: DC
  Administered 2015-12-02 – 2015-12-08 (×7): 60 mg via ORAL
  Filled 2015-12-01 (×3): qty 1
  Filled 2015-12-01: qty 3
  Filled 2015-12-01 (×6): qty 1

## 2015-12-01 MED ORDER — ARIPIPRAZOLE 5 MG PO TABS
5.0000 mg | ORAL_TABLET | Freq: Every day | ORAL | Status: DC
  Administered 2015-12-02: 5 mg via ORAL
  Filled 2015-12-01 (×3): qty 1

## 2015-12-01 MED ORDER — DULOXETINE HCL 30 MG PO CPEP
30.0000 mg | ORAL_CAPSULE | Freq: Every day | ORAL | Status: DC
  Administered 2015-12-02 – 2015-12-03 (×2): 30 mg via ORAL
  Filled 2015-12-01 (×4): qty 1

## 2015-12-01 MED ORDER — BENZTROPINE MESYLATE 0.5 MG PO TABS
0.5000 mg | ORAL_TABLET | Freq: Every day | ORAL | Status: DC
  Administered 2015-12-02 – 2015-12-08 (×8): 0.5 mg via ORAL
  Filled 2015-12-01 (×2): qty 1
  Filled 2015-12-01: qty 3
  Filled 2015-12-01 (×9): qty 1

## 2015-12-01 NOTE — ED Notes (Signed)
Dr. Ward at bedside.

## 2015-12-01 NOTE — ED Notes (Signed)
Pt to ED from [facility] c/o L sided chest pain radiating into L arm onset during therapy group 1.5 hours ago. Denies any new medications. Pain worsens on palpation

## 2015-12-01 NOTE — Progress Notes (Signed)
Adult Psychoeducational Group Note  Date:  12/01/2015 Time: 8:27 PM  Group Topic/Focus:  Wrap-Up Group:   The focus of this group is to help patients review their daily goal of treatment and discuss progress on daily workbooks.  Participation Level:  Minimal  Participation Quality:  Appropriate and Attentive  Affect:  Blunted  Cognitive:  Appropriate  Insight: Appropriate  Engagement in Group:  Engaged  Modes of Intervention:  Discussion  Additional Comments:  Pt seemed depressed and blunt during wrap-up group, however pt was still pleasant. Pt rated overall day a 5 out of 10 because "it was just an average day". Pt reported that she did not have a goal for the day and nothing positive happened today.   Cleotilde Neer 12/01/2015, 8:45 PM

## 2015-12-01 NOTE — BHH Suicide Risk Assessment (Signed)
[facility] Admission Suicide Risk Assessment   Nursing information obtained from:    Demographic factors:    Current Mental Status:    Loss Factors:    Historical Factors:    Risk Reduction Factors:     Total Time spent with patient: 30 minutes Principal Problem: MDD (major depressive disorder), recurrent severe, without psychosis (HCC) Diagnosis:   Patient Active Problem List   Diagnosis Date Noted  . PTSD (post-traumatic stress disorder) [F43.10] 12/01/2015  . MDD (major depressive disorder), recurrent severe, without psychosis (HCC) [F33.2] 11/30/2015   Subjective Data: Pt states " I am still depressed, has AH asking me to kill, kill. I have sleep issues ."  Continued Clinical Symptoms:  Alcohol Use Disorder Identification Test Final Score (AUDIT): 0 The "Alcohol Use Disorders Identification Test", Guidelines for Use in Primary Care, Second Edition.  [employer] [facility]). Score between 0-7:  no or low risk or alcohol related problems. Score between 8-15:  moderate risk of alcohol related problems. Score between 16-19:  high risk of alcohol related problems. Score 20 or above:  warrants further diagnostic evaluation for alcohol dependence and treatment.   CLINICAL FACTORS:   Depression:   Hopelessness Impulsivity Insomnia Unstable or Poor Therapeutic Relationship Previous Psychiatric Diagnoses and Treatments Medical Diagnoses and Treatments/Surgeries   Musculoskeletal: Strength & Muscle Tone: within normal limits Gait & Station: normal Patient leans: N/A  Psychiatric Specialty Exam: Review of Systems  Psychiatric/Behavioral: Positive for depression, hallucinations and substance abuse. The patient is nervous/anxious and has insomnia.   All other systems reviewed and are negative.   Blood pressure 123/78, pulse 87, temperature 98.6 F (37 C), temperature source Oral, resp. rate 16, height  (1.6 m), weight 139.708 kg (308 lb).Body mass index is 54.57 kg/(m^2).   General Appearance: Casual  Eye Contact::  Fair  Speech:  Clear and Coherent  Volume:  Normal  Mood:  Anxious and Depressed  Affect:  Congruent  Thought Process:  Coherent  Orientation:  Full (Time, Place, and Person)  Thought Content:  AH saying Kill kill  Suicidal Thoughts:  Yes.  without intent/plan  Homicidal Thoughts:  No  Memory:  Immediate;   Fair Recent;   Fair Remote;   Fair  Judgement:  Impaired  Insight:  Fair  Psychomotor Activity:  Normal  Concentration:  Poor  Recall:  [employer] of Knowledge:Fair  Language: Fair  Akathisia:  No  Handed:  Right  AIMS (if indicated):     Assets:  Desire for Improvement  Sleep:  Number of Hours: 6.75  Cognition: WNL  ADL's:  Intact    COGNITIVE FEATURES THAT CONTRIBUTE TO RISK:  Closed-mindedness, Polarized thinking and Thought constriction (tunnel vision)    SUICIDE RISK:   Moderate:  Frequent suicidal ideation with limited intensity, and duration, some specificity in terms of plans, no associated intent, good self-control, limited dysphoria/symptomatology, some risk factors present, and identifiable protective factors, including available and accessible social support.  PLAN OF CARE: Pt is a 71 y old AAF , single , unemployed , has hx of MDD , PTSD , alcohol use disorder, cannabis abuse, cocaine abuse ( remission)  , Opioid abuse ( remission ), who presented with worsening depression and AH. Will start treatment. Patient will benefit from inpatient treatment and stabilization.  Estimated length of stay is 5-7 days.  Reviewed past medical records,treatment plan.  Increase Cymbalta to 90 mg po daily for affective sx. Will increase Trazodone to 100 mg po qhs for sleep. Will  add Abilify 5 mg po qhs for psychosis as well as to augment the effect of Cymbalta. Will add Cogentin 0.5 mg po qhs for EPS. Will make available PRN medications as per agitation protocol. Will continue to monitor vitals ,medication compliance and treatment  side effects while patient is here.  Will monitor for medical issues as well as call consult as needed.  Reviewed labs ,will order TSH, lipid panel, HBa1c, PL, EKG if not already done. CSW will start working on disposition.  Patient to participate in therapeutic milieu .       I certify that inpatient services furnished can reasonably be expected to improve the patient's condition.   Vesta Wheeland, MD 12/01/2015, 2:43 PM

## 2015-12-01 NOTE — Progress Notes (Addendum)
Per pt's request: an attempt was made to contact Nicky Pugh in regards to pt transferring to the Ed for evaluation of chest pain.   Nicky Pugh aware of the above information as of 2130.

## 2015-12-01 NOTE — Progress Notes (Signed)
Charge, RN of [facility] notified of pt's transfer for evaluation of chest pain.
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
## 2015-12-01 NOTE — Progress Notes (Signed)
D. Pt was up and visible in milieu this evening, minimal interaction or participation in milieu. Pt did endorse anxiety and auditory hallucinations this evening and did request and receive medication without incident. Pt also endorsed depression and passive SI but able to contract for safety on the unit. A. Support provided, medication education given. R. Pt verbalized understanding, safety maintained.

## 2015-12-01 NOTE — Significant Event (Cosign Needed)
Notified by nursing staff that the patient exited group with c/oo of left sided sharp chest pains. The chest pains are rated a 9/10 with radiation to her left arm. She is endorsing some associated SOB, but denies diaphoresis or near syncope. The patient cannot endorse any exercise intolerance. Known risk factors include smoker, HTN, and DM. She is denying any family hx of known premature coronary disease, or history if prior CVA, TIA, Angina or CM. She is denying any prior NST or ETT. Review of V/S are WNL. EKG  Notes NSR, normal axis, No ST and or TWI, QTC 441 ms. Compared to previous study dated 30 Nov 2015 18:48;16 no significant ischemic changes noted. Patient will be sent to [facility] ED for further evaluation due to multiple risk factors and Timi Score (1)

## 2015-12-01 NOTE — Progress Notes (Signed)
D:Per patient self inventory form pt reports she slept fair last night. She reports a good appetite, low energy level, poor concentration. She rates depression 10/10, hopelessness 10/10, anxiety 10/10- all on 0-10 scale, 10 being the worse. Pt reports passive SI. Pt reports she feels safe at the hospital and reports no plan to hurt herself. Pt endorses auditory hallucinations.  A:Special checks q 15 mins in place for safety. Medication administered per MD order (see eMAR) Encouragement and support provided.  R:safety maintained. Pt verbally contracts for safety. Compliant with medication regimen. Will continue to monitor.

## 2015-12-01 NOTE — Progress Notes (Signed)
BP medication held d/t low blood pressure. MD notified. No new order

## 2015-12-01 NOTE — BHH Group Notes (Signed)
[facility] LCSW Aftercare Discharge Planning Group Note   12/01/2015 1:22 PM  Participation Quality:  Engaged  Mood/Affect:  Depressed  Depression Rating:  10  Anxiety Rating:  10  Thoughts of Suicide:  Yes Will you contract for safety?   Yes  Current AVH:  Yes  Plan for Discharge/Comments:  "I've been having thoughts of SI and HI.  It's happened before.  It's been going on for about a week now."  States she was her last fall, and that she receives services at [facility].  Declined to give more information about stressors or changes.  "It's just a lot of things."  Transportation Means: unk  Supports: unk  [place], [place]

## 2015-12-01 NOTE — Progress Notes (Signed)
Transported by EMS to [place]

## 2015-12-01 NOTE — BHH Suicide Risk Assessment (Signed)
B[facility] INPATIENT:  Family/Significant Other Suicide Prevention Education  Suicide Prevention Education:  Patient Refusal for Family/Significant Other Suicide Prevention Education: The patient Hailey Klein has refused to provide written consent for family/significant other to be provided Family/Significant Other Suicide Prevention Education during admission and/or prior to discharge.  Physician notified.  Pt does not have anyone to contact at this time. Pt gave the number of her sister Juel Burrow [phone]) but the sister lives in [place] and has a distant relationship with the pt.  Jonathon Jordan 12/01/2015, 2:53 PM
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
## 2015-12-01 NOTE — Progress Notes (Signed)
Pt with h/o HTN, and DM c/o of left sided sharp chest pains radiating to left arm; Pt rated pain at 9/10. Got an EKG per PA; Pt was also examined by PA. Per PA, Pt to be sent to the ED for further evaluation.

## 2015-12-01 NOTE — [facility] Group Notes (Signed)
[facility] LCSW Group Therapy  12/01/2015 1:25 PM  Type of Therapy: Group Therapy  Participation Level: Active  Participation Quality: Attentive  Affect: Flat  Cognitive: Oriented  Insight: Limited  Engagement in Therapy: Engaged  Modes of Intervention: Discussion and Socialization  Summary of Progress/Problems: Onalee Hua from [facility] [employer] was here to tell his story of recovery and play his guitar. Stayed for the entire group. Was pleasant and appropriate.   Vito Backers. Beverely Pace 12/01/2015 1:25 PM
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
## 2015-12-02 DIAGNOSIS — F1021 Alcohol dependence, in remission: Secondary | ICD-10-CM

## 2015-12-02 DIAGNOSIS — F1410 Cocaine abuse, uncomplicated: Secondary | ICD-10-CM | POA: Clinically undetermined

## 2015-12-02 DIAGNOSIS — R45851 Suicidal ideations: Secondary | ICD-10-CM

## 2015-12-02 DIAGNOSIS — F1421 Cocaine dependence, in remission: Secondary | ICD-10-CM | POA: Clinically undetermined

## 2015-12-02 DIAGNOSIS — F1210 Cannabis abuse, uncomplicated: Secondary | ICD-10-CM

## 2015-12-02 DIAGNOSIS — F1490 Cocaine use, unspecified, uncomplicated: Secondary | ICD-10-CM

## 2015-12-02 DIAGNOSIS — F1190 Opioid use, unspecified, uncomplicated: Secondary | ICD-10-CM

## 2015-12-02 DIAGNOSIS — F1121 Opioid dependence, in remission: Secondary | ICD-10-CM | POA: Clinically undetermined

## 2015-12-02 LAB — PROLACTIN: PROLACTIN: 55.7 ng/mL — AB (ref 4.8–23.3)

## 2015-12-02 LAB — GLUCOSE, CAPILLARY: Glucose-Capillary: 145 mg/dL — ABNORMAL HIGH (ref 65–99)

## 2015-12-02 LAB — I-STAT TROPONIN, ED: TROPONIN I, POC: 0.01 ng/mL (ref 0.00–0.08)

## 2015-12-02 MED ORDER — SODIUM CHLORIDE 0.9 % IV BOLUS (SEPSIS)
1000.0000 mL | Freq: Once | INTRAVENOUS | Status: AC
  Administered 2015-12-02: 1000 mL via INTRAVENOUS

## 2015-12-02 MED ORDER — ARIPIPRAZOLE 10 MG PO TABS
10.0000 mg | ORAL_TABLET | Freq: Every day | ORAL | Status: DC
  Administered 2015-12-02 – 2015-12-05 (×4): 10 mg via ORAL
  Filled 2015-12-02 (×6): qty 1

## 2015-12-02 NOTE — Discharge Instructions (Signed)
Nonspecific Chest Pain  °Chest pain can be caused by many different conditions. There is always a chance that your pain could be related to something serious, such as a heart attack or a blood clot in your lungs. Chest pain can also be caused by conditions that are not life-threatening. If you have chest pain, it is very important to follow up with your health care provider. °CAUSES  °Chest pain can be caused by: °· Heartburn. °· Pneumonia or bronchitis. °· Anxiety or stress. °· Inflammation around your heart (pericarditis) or lung (pleuritis or pleurisy). °· A blood clot in your lung. °· A collapsed lung (pneumothorax). It can develop suddenly on its own (spontaneous pneumothorax) or from trauma to the chest. °· Shingles infection (varicella-zoster virus). °· Heart attack. °· Damage to the bones, muscles, and cartilage that make up your chest wall. This can include: °¨ Bruised bones due to injury. °¨ Strained muscles or cartilage due to frequent or repeated coughing or overwork. °¨ Fracture to one or more ribs. °¨ Sore cartilage due to inflammation (costochondritis). °RISK FACTORS  °Risk factors for chest pain may include: °· Activities that increase your risk for trauma or injury to your chest. °· Respiratory infections or conditions that cause frequent coughing. °· Medical conditions or overeating that can cause heartburn. °· Heart disease or family history of heart disease. °· Conditions or health behaviors that increase your risk of developing a blood clot. °· Having had chicken pox (varicella zoster). °SIGNS AND SYMPTOMS °Chest pain can feel like: °· Burning or tingling on the surface of your chest or deep in your chest. °· Crushing, pressure, aching, or squeezing pain. °· Dull or sharp pain that is worse when you move, cough, or take a deep breath. °· Pain that is also felt in your back, neck, shoulder, or arm, or pain that spreads to any of these areas. °Your chest pain may come and go, or it may stay  constant. °DIAGNOSIS °Lab tests or other studies may be needed to find the cause of your pain. Your health care provider may have you take a test called an ambulatory ECG (electrocardiogram). An ECG records your heartbeat patterns at the time the test is performed. You may also have other tests, such as: °· Transthoracic echocardiogram (TTE). During echocardiography, sound waves are used to create a picture of all of the heart structures and to look at how blood flows through your heart. °· Transesophageal echocardiogram (TEE). This is a more advanced imaging test that obtains images from inside your body. It allows your health care provider to see your heart in finer detail. °· Cardiac monitoring. This allows your health care provider to monitor your heart rate and rhythm in real time. °· Holter monitor. This is a portable device that records your heartbeat and can help to diagnose abnormal heartbeats. It allows your health care provider to track your heart activity for several days, if needed. °· Stress tests. These can be done through exercise or by taking medicine that makes your heart beat more quickly. °· Blood tests. °· Imaging tests. °TREATMENT  °Your treatment depends on what is causing your chest pain. Treatment may include: °· Medicines. These may include: °¨ Acid blockers for heartburn. °¨ Anti-inflammatory medicine. °¨ Pain medicine for inflammatory conditions. °¨ Antibiotic medicine, if an infection is present. °¨ Medicines to dissolve blood clots. °¨ Medicines to treat coronary artery disease. °· Supportive care for conditions that do not require medicines. This may include: °¨ Resting. °¨ Applying heat   or cold packs to injured areas. °¨ Limiting activities until pain decreases. °HOME CARE INSTRUCTIONS °· If you were prescribed an antibiotic medicine, finish it all even if you start to feel better. °· Avoid any activities that bring on chest pain. °· Do not use any tobacco products, including  cigarettes, chewing tobacco, or electronic cigarettes. If you need help quitting, ask your health care provider. °· Do not drink alcohol. °· Take medicines only as directed by your health care provider. °· Keep all follow-up visits as directed by your health care provider. This is important. This includes any further testing if your chest pain does not go away. °· If heartburn is the cause for your chest pain, you may be told to keep your head raised (elevated) while sleeping. This reduces the chance that acid will go from your stomach into your esophagus. °· Make lifestyle changes as directed by your health care provider. These may include: °¨ Getting regular exercise. Ask your health care provider to suggest some activities that are safe for you. °¨ Eating a heart-healthy diet. A registered dietitian can help you to learn healthy eating options. °¨ Maintaining a healthy weight. °¨ Managing diabetes, if necessary. °¨ Reducing stress. °SEEK MEDICAL CARE IF: °· Your chest pain does not go away after treatment. °· You have a rash with blisters on your chest. °· You have a fever. °SEEK IMMEDIATE MEDICAL CARE IF:  °· Your chest pain is worse. °· You have an increasing cough, or you cough up blood. °· You have severe abdominal pain. °· You have severe weakness. °· You faint. °· You have chills. °· You have sudden, unexplained chest discomfort. °· You have sudden, unexplained discomfort in your arms, back, neck, or jaw. °· You have shortness of breath at any time. °· You suddenly start to sweat, or your skin gets clammy. °· You feel nauseous or you vomit. °· You suddenly feel light-headed or dizzy. °· Your heart begins to beat quickly, or it feels like it is skipping beats. °These symptoms may represent a serious problem that is an emergency. Do not wait to see if the symptoms will go away. Get medical help right away. Call your local emergency services (911 in the [place] Do not drive yourself to the hospital. °  °This  information is not intended to replace advice given to you by your health care provider. Make sure you discuss any questions you have with your health care provider. °  °Document Released: 08/09/2005 Document Revised: 11/20/2014 Document Reviewed: 06/05/2014 °Elsevier Interactive Patient Education ©2016 [employer] ° °

## 2015-12-02 NOTE — Progress Notes (Signed)
[facility] MD Progress Note  12/02/2015 11:13 AM Hailey Klein  MRN:  [id number] Subjective: Patient states " I still hear AH asking me to kill and I want to hurt this room mate of mine.'  Objective: Hailey Klein, a 50 y.o. AA female , single , unemployed , homeless has a hx of MDD as well as substance abuse (alcohol in remission, cocaine in remission, cannabis , oxycodone in remission ) who presented to the Emergency Department complaining of suicidal and homicidal thoughts.   Patient seen and chart reviewed.Discussed patient with treatment team.  Pt today seen as depressed , continues to report AH asking her to " kill " as well as reports having HI towards her room mate. Pt reports that if she walks out of this facility she will pursue this room mate and hurt her. Pt continues to report anxiety sx, depressive sx , tolerating medications well. Will continue to encourage and support. Per staff pt had chest pain , was send out to ED for evaluation last night - is currently medically cleared.     Principal Problem: MDD (major depressive disorder), recurrent, severe, with psychosis ([place]) Diagnosis:   Patient Active Problem List   Diagnosis Date Noted  . Alcohol use disorder, moderate, in sustained remission ([place]) [F10.21] 12/02/2015  . Cocaine use disorder, moderate, in sustained remission [F14.90] 12/02/2015  . Opioid use disorder, moderate, in sustained remission [F11.90] 12/02/2015  . Cannabis use disorder, mild, abuse [F12.10] 12/02/2015  . PTSD (post-traumatic stress disorder) [F43.10] 12/01/2015  . MDD (major depressive disorder), recurrent, severe, with psychosis ([place]) [F33.3] 12/01/2015   Total Time spent with patient: 30 minutes  Past Psychiatric History: Patient has had several admissions to mental health facilities [facility]. Pt has hx of suicide attempt in the past - cut wrist.  Past Medical History:  Past Medical History  Diagnosis Date  . Diabetes mellitus without  complication ([place])   . Hypertension   . PTSD (post-traumatic stress disorder)   . H/O suicide attempt     cut wrists - 19-y-o   History reviewed. No pertinent past surgical history. Family History:  Family History  Problem Relation Age of Onset  . Alcoholism Other    Family Psychiatric  History: Patient reports that several family members on her dad's side has alcoholism. Pt denies hx of mental illness, suicide in family. Social History: Pt is single , unemployed , is currently homeless. Pt denies any pending legal charges. Pt denies having children. History  Alcohol Use  . 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 Yes  . Special: "Crack" cocaine    Comment: crack cocaine binge 1 mth ago. thc over 5 years ago.     Social History   Social History  . Marital Status: Single    Spouse Name: N/A  . Number of Children: N/A  . Years of Education: N/A   Social History Main Topics  . Smoking status: Former Research scientist (life sciences)  . Smokeless tobacco: None  . 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 Drug Use: Yes    Special: "Crack" cocaine     Comment: crack cocaine binge 1 mth ago. thc over 5 years ago.   Marland Kitchen Sexual Activity: Not Asked   Other Topics Concern  . None  Social History Narrative   Additional Social History:                         Sleep: Fair  Appetite:  Fair  Current Medications: Current Facility-Administered Medications  Medication Dose Route Frequency Provider Last Rate Last Dose  . acetaminophen (TYLENOL) tablet 650 mg  650 mg Oral Q6H PRN Niel Hummer, NP   650 mg at 12/02/15 0932  . alum & mag hydroxide-simeth (MAALOX/MYLANTA) 200-200-20 MG/5ML suspension 30 mL  30 mL Oral Q4H PRN Niel Hummer, NP      . ARIPiprazole (ABILIFY) tablet 10 mg  10 mg Oral QHS Milliana Reddoch, MD      . benztropine (COGENTIN) tablet 0.5 mg  0.5 mg Oral  QHS Kandance Yano, MD   0.5 mg at 12/02/15 0014  . DULoxetine (CYMBALTA) DR capsule 30 mg  30 mg Oral Daily Ursula Alert, MD   30 mg at 12/02/15 0821  . DULoxetine (CYMBALTA) DR capsule 60 mg  60 mg Oral QPM Lailah Marcelli, MD      . gabapentin (NEURONTIN) capsule 400 mg  400 mg Oral TID Niel Hummer, NP   400 mg at 12/02/15 0820  . glipiZIDE (GLUCOTROL) tablet 5 mg  5 mg Oral BID AC Niel Hummer, NP   5 mg at 12/02/15 0641  . hydrochlorothiazide (HYDRODIURIL) tablet 25 mg  25 mg Oral Daily Niel Hummer, NP   25 mg at 12/02/15 0820  . hydrOXYzine (ATARAX/VISTARIL) tablet 25 mg  25 mg Oral Q6H PRN Benjamine Mola, FNP   25 mg at 12/01/15 1055  . lisinopril (PRINIVIL,ZESTRIL) tablet 20 mg  20 mg Oral Daily Niel Hummer, NP   20 mg at 12/02/15 0820  . risperiDONE (RISPERDAL M-TABS) disintegrating tablet 2 mg  2 mg Oral Q8H PRN Laverle Hobby, PA-C   2 mg at 11/30/15 2056   And  . LORazepam (ATIVAN) tablet 1 mg  1 mg Oral PRN Laverle Hobby, PA-C       And  . ziprasidone (GEODON) injection 20 mg  20 mg Intramuscular PRN Laverle Hobby, PA-C      . magnesium hydroxide (MILK OF MAGNESIA) suspension 30 mL  30 mL Oral Daily PRN Niel Hummer, NP      . traZODone (DESYREL) tablet 100 mg  100 mg Oral QHS Ursula Alert, MD   100 mg at 12/02/15 0014    Lab Results:  Results for orders placed or performed during the hospital encounter of 11/30/15 (from the past 48 hour(s))  Lipid panel     Status: Abnormal   Collection Time: 12/01/15  6:25 AM  Result Value Ref Range   Cholesterol 171 0 - 200 mg/dL   Triglycerides 73 <150 mg/dL   HDL 45 >40 mg/dL   Total CHOL/HDL Ratio 3.8 RATIO   VLDL 15 0 - 40 mg/dL   LDL Cholesterol 111 (H) 0 - 99 mg/dL    Comment:        Total Cholesterol/HDL:CHD Risk Coronary Heart Disease Risk Table                     Men   Women  1/2 Average Risk   3.4   3.3  Average Risk       5.0   4.4  2 X Average Risk   9.6   7.1  3 X Average Risk  23.4  11.0        Use  the calculated Patient Ratio above and the CHD Risk Table to determine the patient's CHD Risk.        ATP III CLASSIFICATION (LDL):  <100     mg/dL   Optimal  100-129  mg/dL   Near or Above                    Optimal  130-159  mg/dL   Borderline  160-189  mg/dL   High  >190     mg/dL   Very High Performed at [facility]   Prolactin     Status: Abnormal   Collection Time: 12/01/15  6:25 AM  Result Value Ref Range   Prolactin 55.7 (H) 4.8 - 23.3 ng/mL    Comment: (NOTE) Performed At: [facility] [place], [place] [id number] Lindon Romp MD WU:[id number] Performed at [facility]   TSH     Status: None   Collection Time: 12/01/15  6:25 AM  Result Value Ref Range   TSH 2.446 0.350 - 4.500 uIU/mL    Comment: Performed at [facility]  T4, free     Status: None   Collection Time: 12/01/15  6:25 AM  Result Value Ref Range   Free T4 0.86 0.61 - 1.12 ng/dL    Comment: Performed at [facility]  Pregnancy, urine     Status: None   Collection Time: 12/01/15  6:51 PM  Result Value Ref Range   Preg Test, Ur NEGATIVE NEGATIVE    Comment:        THE SENSITIVITY OF THIS METHODOLOGY IS >20 mIU/mL. Performed at [facility]   I-stat troponin, ED (not at [facility], [facility])     Status: None   Collection Time: 12/01/15 11:21 PM  Result Value Ref Range   Troponin i, poc 0.00 0.00 - 0.08 ng/mL   Comment 3            Comment: Due to the release kinetics of cTnI, a negative result within the first hours of the onset of symptoms does not rule out myocardial infarction with certainty. If myocardial infarction is still suspected, repeat the test at appropriate intervals.   Basic metabolic panel     Status: Abnormal   Collection Time: 12/01/15 11:29 PM  Result Value Ref Range   Sodium 136 135 - 145 mmol/L   Potassium 4.8 3.5 - 5.1 mmol/L   Chloride 98 (L) 101 - 111 mmol/L   CO2 26 22 - 32 mmol/L    Glucose, Bld 120 (H) 65 - 99 mg/dL   BUN 25 (H) 6 - 20 mg/dL   Creatinine, Ser 1.38 (H) 0.44 - 1.00 mg/dL   Calcium 9.3 8.9 - 10.3 mg/dL   GFR calc non Af Amer 45 (L) >60 mL/min   GFR calc Af Amer 52 (L) >60 mL/min    Comment: (NOTE) The eGFR has been calculated using the CKD EPI equation. This calculation has not been validated in all clinical situations. eGFR's persistently <60 mL/min signify possible Chronic Kidney Disease.    Anion gap 12 5 - 15  CBC     Status: Abnormal   Collection Time: 12/01/15 11:29 PM  Result Value Ref Range   WBC 6.6 4.0 - 10.5 K/uL   RBC 3.46 (L) 3.87 - 5.11 MIL/uL   Hemoglobin 10.0 (L) 12.0 - 15.0 g/dL   HCT 31.0 (L) 36.0 -  46.0 %   MCV 89.6 78.0 - 100.0 fL   MCH 28.9 26.0 - 34.0 pg   MCHC 32.3 30.0 - 36.0 g/dL   RDW 12.8 11.5 - 15.5 %   Platelets 315 150 - 400 K/uL  I-stat troponin, ED     Status: None   Collection Time: 12/02/15  2:46 AM  Result Value Ref Range   Troponin i, poc 0.01 0.00 - 0.08 ng/mL   Comment 3            Comment: Due to the release kinetics of cTnI, a negative result within the first hours of the onset of symptoms does not rule out myocardial infarction with certainty. If myocardial infarction is still suspected, repeat the test at appropriate intervals.   Glucose, capillary     Status: Abnormal   Collection Time: 12/02/15  6:15 AM  Result Value Ref Range   Glucose-Capillary 145 (H) 65 - 99 mg/dL    Physical Findings: AIMS: Facial and Oral Movements Muscles of Facial Expression: None, normal Lips and Perioral Area: None, normal Jaw: None, normal Tongue: None, normal,Extremity Movements Upper (arms, wrists, hands, fingers): None, normal Lower (legs, knees, ankles, toes): None, normal, Trunk Movements Neck, shoulders, hips: None, normal, Overall Severity Severity of abnormal movements (highest score from questions above): None, normal Incapacitation due to abnormal movements: None, normal Patient's awareness of  abnormal movements (rate only patient's report): No Awareness, Dental Status Current problems with teeth and/or dentures?: No Does patient usually wear dentures?: No  CIWA:    COWS:     Musculoskeletal: Strength & Muscle Tone: within normal limits Gait & Station: normal Patient leans: N/A  Psychiatric Specialty Exam: Review of Systems  Psychiatric/Behavioral: Positive for depression and hallucinations. The patient is nervous/anxious.   All other systems reviewed and are negative.   Blood pressure 117/67, pulse 93, temperature 98.9 F (37.2 C), temperature source Oral, resp. rate 12, height 5' 3"  (1.6 m), weight 139.708 kg (308 lb), SpO2 96 %.Body mass index is 54.57 kg/(m^2).  General Appearance: Casual  Eye Contact::  Fair  Speech:  Clear and Coherent  Volume:  Decreased  Mood:  Anxious and Depressed  Affect:  Congruent  Thought Process:  Goal Directed  Orientation:  Full (Time, Place, and Person)  Thought Content:  Hallucinations: Auditory Command:  to kill and Rumination  Suicidal Thoughts:  No  Homicidal Thoughts:  Yes.  with intent/plan towards room mate   Memory:  Immediate;   Fair Recent;   Fair Remote;   Fair  Judgement:  Impaired  Insight:  Shallow  Psychomotor Activity:  Normal  Concentration:  Poor  Recall:  [employer] of Knowledge:Fair  Language: Fair  Akathisia:  No  Handed:  Right  AIMS (if indicated):     Assets:  Others:  access to health care  ADL's:  Intact  Cognition: WNL  Sleep:  Number of Hours: 6.75   Treatment Plan Summary:Pt is a 82 y old AAF , single , unemployed , has hx of MDD , PTSD , alcohol use disorder, cannabis abuse, cocaine abuse ( remission) , Opioid abuse ( remission ), who presented with worsening depression and AH. Pt continue to have AH , command as well as is HI towards her room mate. Will continue to treatment. Daily contact with patient to assess and evaluate symptoms and progress in treatment and Medication  management  Increased Cymbalta to 90 mg po daily for affective sx. Will continue Trazodone  100 mg po  qhs for sleep. Will increase Abilify to 10 mg po qhs for psychosis as well as to augment the effect of Cymbalta. Will continue Cogentin 0.5 mg po qhs for EPS. Will make available PRN medications as per agitation protocol. Will continue to monitor vitals ,medication compliance and treatment side effects while patient is here.  Will monitor for medical issues as well as call consult as needed.  Reviewed labs ,TSH - wnl, PL - elevated - pt currently on Abilify which will help reduce it, Lipid panel - wnl. EKG - wnl. Patient was evaluated in ED for chest pain last night - medically cleared. CSW will start working on disposition.  Recreational therapy consult. Patient to participate in therapeutic milieu .  Tabathia Knoche MD 12/02/2015, 11:13 AM

## 2015-12-02 NOTE — [facility] Group Notes (Signed)
[facility] Group Notes:  (Nursing/MHT/Case Management/Adjunct)  Date:  12/02/2015  Time:  11:09 AM  Type of Therapy:  Nurse Education  Participation Level:  Active  Participation Quality:  Appropriate and Attentive  Affect:  Appropriate  Cognitive:  Alert and Appropriate  Insight:  Appropriate and Good  Engagement in Group:  Engaged and Improving  Modes of Intervention:  Activity, Discussion and Education   Summary of Progress/Problems: Topic was on leisure and lifestyle changes. Discussed the importance of choosing a healthy leisure activities. Group encouraged to surround themselves with positive and healthy group/support system when changing to a healthy lifestyle. Patient was receptive and contributed. Patient states, "having a 1 on 1 therapy and listening to music are things that has helped in the past.   Mickie Bail 12/02/2015, 11:09 AM

## 2015-12-02 NOTE — ED Notes (Signed)
Pt given turkey sandwich

## 2015-12-02 NOTE — Progress Notes (Signed)
Pt back onto the unit accompanied by MHT; Pt was able to walk independently. Pt at this time denies chest pain. Per ED report, Pt cardiac tests were negative.

## 2015-12-02 NOTE — Progress Notes (Signed)
D: Hailey Klein has complaints of constipation and abdominal pain.  She states today was "confusing inside and out". She has questions about her metformin.  Denies SI/HI/AVH at this time. Contracts for safety.  A: Q 15 minute checks for patient safety. Medications administered as prescribed. Encouragement and support given.  R: Continue to monitor for patient safety and medication effectiveness.

## 2015-12-02 NOTE — Progress Notes (Signed)
DAR NOTE: Patient presents with anxious affect and depressed mood.  Denies visual hallucinations.  Rates depression at 9, hopelessness at 9, and anxiety at 9.  Describes energy level as low and concentration as poor.  Reports hearing voices telling her "to kill herself."  Patient contracts for safety.  Maintained on routine safety checks.  Medications given as prescribed.  Support and encouragement offered as needed.  Attended group and participated.  Patient observed socializing with peers in the dayroom.   Vistaril given for anxiety with good effect.

## 2015-12-02 NOTE — Progress Notes (Signed)
Pt is alert and oriented x4. Pt endorses moderate depression and anxiety. Pt also endorses command auditory hallucinations to harm others; Pt contracted for safety. Support, encouragement, and safe environment provided.  Pt attended group. 15-minute safety checks continue.

## 2015-12-02 NOTE — [facility] Group Notes (Signed)
[facility] LCSW Group Therapy  12/02/2015 4:26 PM  Type of Therapy: Process Group Therapy  Participation Level: Active  Participation Quality: Appropriate  Affect: Flat  Cognitive: Oriented  Insight: Limited  Engagement in Group: Engaged  Engagement in Therapy: Limited  Modes of Intervention: Activity, Clarification, Education, Problem-solving and Support  Summary of Progress/Problems: Today's group addressed the issue of overcoming obstacles. Patients were asked to identify their biggest obstacle post d/c that stands in the way of their on-going success, and then problem solve as to how to manage this.  Pt spoke about staying focused being her main obstacle. 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 She stated that she used to really enjoy that work. Pt also spoke about wanting to make amends with her sister, and became tearful when she explained that she did some things to her sister in the past that weren't right.  Vito Backers. Hailey Klein, MSW Intern 12/02/2015 4:26 PM

## 2015-12-02 NOTE — Progress Notes (Signed)
Adult Psychoeducational Group Note  Date:  12/02/2015 Time:  9:22 PM  Group Topic/Focus:  Wrap-Up Group:   The focus of this group is to help patients review their daily goal of treatment and discuss progress on daily workbooks.  Participation Level:  Active  Participation Quality:  Appropriate and Attentive  Affect:  Appropriate  Cognitive:  Appropriate  Insight: Appropriate and Good  Engagement in Group:  Engaged  Modes of Intervention:  Discussion  Additional Comments:  Pt rated her day a 6 out of 10. Pt goal for tomorrow is to work with a Veterinary surgeon and get transferred to another facility.   Merlinda Frederick 12/02/2015, 9:22 PM

## 2015-12-02 NOTE — ED Notes (Signed)
Pt has no belongings to be transferred back with . [facility] at bedside for transfer

## 2015-12-03 DIAGNOSIS — R45850 Homicidal ideations: Secondary | ICD-10-CM

## 2015-12-03 LAB — CBG MONITORING, ED: GLUCOSE-CAPILLARY: 143 mg/dL — AB (ref 65–99)

## 2015-12-03 LAB — HEMOGLOBIN A1C
HEMOGLOBIN A1C: 6.6 % — AB (ref 4.8–5.6)
MEAN PLASMA GLUCOSE: 143 mg/dL

## 2015-12-03 LAB — GLUCOSE, CAPILLARY: GLUCOSE-CAPILLARY: 162 mg/dL — AB (ref 65–99)

## 2015-12-03 MED ORDER — DULOXETINE HCL 60 MG PO CPEP
60.0000 mg | ORAL_CAPSULE | Freq: Every day | ORAL | Status: DC
  Administered 2015-12-04 – 2015-12-09 (×6): 60 mg via ORAL
  Filled 2015-12-03: qty 3
  Filled 2015-12-03 (×7): qty 1

## 2015-12-03 MED ORDER — METFORMIN HCL 500 MG PO TABS
1000.0000 mg | ORAL_TABLET | Freq: Two times a day (BID) | ORAL | Status: DC
  Administered 2015-12-03 – 2015-12-09 (×13): 1000 mg via ORAL
  Filled 2015-12-03 (×18): qty 2

## 2015-12-03 NOTE — [facility] Group Notes (Signed)
[facility] LCSW Group Therapy  12/03/2015  1:05 PM  Type of Therapy:  Group therapy  Participation Level:  Active  Participation Quality:  Attentive  Affect:  Flat  Cognitive:  Oriented  Insight:  Limited  Engagement in Therapy:  Limited  Modes of Intervention:  Discussion, Socialization  Summary of Progress/Problems:  Chaplain was here to lead a group on themes of hope and courage.  "My purpose is life is to help other people,  Especially people with dementia and Al[place]'s.  My grandmother had dementia, and that has motivated me for this purpose."  Hailey Klein 12/03/2015 1:22 PM  '

## 2015-12-03 NOTE — Progress Notes (Signed)
Recreation Therapy Notes  01.20.2017 approximately 8:30am Per MD order LRT met with patient to investigate ways to enhance tx during admission. Patient reports prior to inpatient hospitalization she was experiencing SI and HI towards her roommate. Patient reports she has been living with this roommate for approximately 1 month. Patient reports roommate "portaryed herself to be someone she is not." Describing this as a drug user and "she started talking to me in a certain kind of way." Patient expressed desire to move into her own home and be self-supported. Patient reports she has been living in [facility] for approximately 1 year, moving here from [facility]. 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 Patient expressed desire to return to this kind of work. Patient identified unemployment and not having her own home as significant stressors for her. Coping skills identified of reading the bible and calling her pastor. Patient reports she enjoys taking walks, going to the park, museums, movies and going out to eat as leisure interest.   LRT to provide information on [facility] to patient and introduce patient to stress management techniques. Deep breathing introduced during initial meeting. Patient positively responded to technique and demonstrated ability to practice independently post d/c. Patient agreeable to continue work with LRT during admission.   Hailey Klein Hailey Klein, LRT/CTRS   Hailey Klein L 12/03/2015 9:04 AM

## 2015-12-03 NOTE — Progress Notes (Signed)
[facility] MD Progress Note  12/03/2015 11:20 AM Hailey Klein  MRN:  [id number] Subjective: Patient states " I still have SI as well as HI."   Objective: Hailey Klein, a 50 y.o. AA female , single , unemployed , homeless has a hx of MDD as well as substance abuse (alcohol in remission, cocaine in remission, cannabis , oxycodone in remission ) who presented to the Emergency Department complaining of suicidal and homicidal thoughts.   Patient seen and chart reviewed.Discussed patient with treatment team.  Pt today seen as depressed , rates her depression at 6/10. Pt also with intrusive thoughts - wants to hurt her room mate - but states she is praying she should not. Pt also has passive SI, currently denies plan. Pt reports sleep as improved. Reports anxiety as good.  Pt per staff is anxious , depressed , focussed on her metformin, wants to get back on it.Tolerating medications well. Denies ADRs.      Principal Problem: MDD (major depressive disorder), recurrent, severe, with psychosis ([place]) Diagnosis:   Patient Active Problem List   Diagnosis Date Noted  . Alcohol use disorder, moderate, in sustained remission ([place]) [F10.21] 12/02/2015  . Cocaine use disorder, moderate, in sustained remission [F14.90] 12/02/2015  . Opioid use disorder, moderate, in sustained remission [F11.90] 12/02/2015  . Cannabis use disorder, mild, abuse [F12.10] 12/02/2015  . PTSD (post-traumatic stress disorder) [F43.10] 12/01/2015  . MDD (major depressive disorder), recurrent, severe, with psychosis ([place]) [F33.3] 12/01/2015   Total Time spent with patient: 25 minutes  Past Psychiatric History: Patient has had several admissions to mental health facilities [facility]. Pt has hx of suicide attempt in the past - cut wrist.  Past Medical History:  Past Medical History  Diagnosis Date  . Diabetes mellitus without complication ([place])   . Hypertension   . PTSD (post-traumatic stress disorder)   . H/O suicide attempt      cut wrists - 19-y-o   History reviewed. No pertinent past surgical history. Family History:  Family History  Problem Relation Age of Onset  . Alcoholism Other    Family Psychiatric  History: Patient reports that several family members on her dad's side has alcoholism. Pt denies hx of mental illness, suicide in family. Social History: Pt is single , unemployed , is currently homeless. Pt denies any pending legal charges. Pt denies having children. History  Alcohol Use  . 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 Yes  . Special: "Crack" cocaine    Comment: crack cocaine binge 1 mth ago. thc over 5 years ago.     Social History   Social History  . Marital Status: Single    Spouse Name: N/A  . Number of Children: N/A  . Years of Education: N/A   Social History Main Topics  . Smoking status: Former Research scientist (life sciences)  . Smokeless tobacco: None  . 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 Drug Use: Yes    Special: "Crack" cocaine     Comment: crack cocaine binge 1 mth ago. thc over 5 years ago.   Marland Kitchen Sexual Activity: Not Asked   Other Topics Concern  . None   Social History Narrative   Additional Social History:  Sleep: Fair  Appetite:  Fair  Current Medications: Current Facility-Administered Medications  Medication Dose Route Frequency Provider Last Rate Last Dose  . acetaminophen (TYLENOL) tablet 650 mg  650 mg Oral Q6H PRN Niel Hummer, NP   650 mg at 12/03/15 0840  . alum & mag hydroxide-simeth (MAALOX/MYLANTA) 200-200-20 MG/5ML suspension 30 mL  30 mL Oral Q4H PRN Niel Hummer, NP      . ARIPiprazole (ABILIFY) tablet 10 mg  10 mg Oral QHS Ursula Alert, MD   10 mg at 12/02/15 2131  . benztropine (COGENTIN) tablet 0.5 mg  0.5 mg Oral QHS Ursula Alert, MD   0.5 mg at 12/02/15 2131  . DULoxetine (CYMBALTA) DR capsule  60 mg  60 mg Oral QPM Daurice Ovando, MD   60 mg at 12/02/15 1658  . [START ON 12/04/2015] DULoxetine (CYMBALTA) DR capsule 60 mg  60 mg Oral Daily Ryana Montecalvo, MD      . gabapentin (NEURONTIN) capsule 400 mg  400 mg Oral TID Niel Hummer, NP   400 mg at 12/03/15 0825  . glipiZIDE (GLUCOTROL) tablet 5 mg  5 mg Oral BID AC Niel Hummer, NP   5 mg at 12/03/15 1791  . hydrochlorothiazide (HYDRODIURIL) tablet 25 mg  25 mg Oral Daily Niel Hummer, NP   25 mg at 12/03/15 0826  . hydrOXYzine (ATARAX/VISTARIL) tablet 25 mg  25 mg Oral Q6H PRN Benjamine Mola, FNP   25 mg at 12/02/15 1503  . lisinopril (PRINIVIL,ZESTRIL) tablet 20 mg  20 mg Oral Daily Niel Hummer, NP   20 mg at 12/03/15 0826  . risperiDONE (RISPERDAL M-TABS) disintegrating tablet 2 mg  2 mg Oral Q8H PRN Laverle Hobby, PA-C   2 mg at 11/30/15 2056   And  . LORazepam (ATIVAN) tablet 1 mg  1 mg Oral PRN Laverle Hobby, PA-C       And  . ziprasidone (GEODON) injection 20 mg  20 mg Intramuscular PRN Laverle Hobby, PA-C      . magnesium hydroxide (MILK OF MAGNESIA) suspension 30 mL  30 mL Oral Daily PRN Niel Hummer, NP   30 mL at 12/02/15 2131  . metFORMIN (GLUCOPHAGE) tablet 1,000 mg  1,000 mg Oral BID WC Ursula Alert, MD   1,000 mg at 12/03/15 1024  . traZODone (DESYREL) tablet 100 mg  100 mg Oral QHS Ursula Alert, MD   100 mg at 12/02/15 2131    Lab Results:  Results for orders placed or performed during the hospital encounter of 11/30/15 (from the past 48 hour(s))  Pregnancy, urine     Status: None   Collection Time: 12/01/15  6:51 PM  Result Value Ref Range   Preg Test, Ur NEGATIVE NEGATIVE    Comment:        THE SENSITIVITY OF THIS METHODOLOGY IS >20 mIU/mL. Performed at [facility]   I-stat troponin, ED (not at [facility])     Status: None   Collection Time: 12/01/15 11:21 PM  Result Value Ref Range   Troponin i, poc 0.00 0.00 - 0.08 ng/mL   Comment 3            Comment: Due to the release  kinetics of cTnI, a negative result within the first hours of the onset of symptoms does not rule out myocardial infarction with certainty. If myocardial infarction is still suspected, repeat the test at appropriate intervals.   Basic metabolic panel  Status: Abnormal   Collection Time: 12/01/15 11:29 PM  Result Value Ref Range   Sodium 136 135 - 145 mmol/L   Potassium 4.8 3.5 - 5.1 mmol/L   Chloride 98 (L) 101 - 111 mmol/L   CO2 26 22 - 32 mmol/L   Glucose, Bld 120 (H) 65 - 99 mg/dL   BUN 25 (H) 6 - 20 mg/dL   Creatinine, Ser 1.38 (H) 0.44 - 1.00 mg/dL   Calcium 9.3 8.9 - 10.3 mg/dL   GFR calc non Af Amer 45 (L) >60 mL/min   GFR calc Af Amer 52 (L) >60 mL/min    Comment: (NOTE) The eGFR has been calculated using the CKD EPI equation. This calculation has not been validated in all clinical situations. eGFR's persistently <60 mL/min signify possible Chronic Kidney Disease.    Anion gap 12 5 - 15  CBC     Status: Abnormal   Collection Time: 12/01/15 11:29 PM  Result Value Ref Range   WBC 6.6 4.0 - 10.5 K/uL   RBC 3.46 (L) 3.87 - 5.11 MIL/uL   Hemoglobin 10.0 (L) 12.0 - 15.0 g/dL   HCT 31.0 (L) 36.0 - 46.0 %   MCV 89.6 78.0 - 100.0 fL   MCH 28.9 26.0 - 34.0 pg   MCHC 32.3 30.0 - 36.0 g/dL   RDW 12.8 11.5 - 15.5 %   Platelets 315 150 - 400 K/uL  I-stat troponin, ED     Status: None   Collection Time: 12/02/15  2:46 AM  Result Value Ref Range   Troponin i, poc 0.01 0.00 - 0.08 ng/mL   Comment 3            Comment: Due to the release kinetics of cTnI, a negative result within the first hours of the onset of symptoms does not rule out myocardial infarction with certainty. If myocardial infarction is still suspected, repeat the test at appropriate intervals.   Glucose, capillary     Status: Abnormal   Collection Time: 12/02/15  6:15 AM  Result Value Ref Range   Glucose-Capillary 145 (H) 65 - 99 mg/dL    Physical Findings: AIMS: Facial and Oral Movements Muscles  of Facial Expression: None, normal Lips and Perioral Area: None, normal Jaw: None, normal Tongue: None, normal,Extremity Movements Upper (arms, wrists, hands, fingers): None, normal Lower (legs, knees, ankles, toes): None, normal, Trunk Movements Neck, shoulders, hips: None, normal, Overall Severity Severity of abnormal movements (highest score from questions above): None, normal Incapacitation due to abnormal movements: None, normal Patient's awareness of abnormal movements (rate only patient's report): No Awareness, Dental Status Current problems with teeth and/or dentures?: No Does patient usually wear dentures?: No  CIWA:    COWS:     Musculoskeletal: Strength & Muscle Tone: within normal limits Gait & Station: normal Patient leans: N/A  Psychiatric Specialty Exam: Review of Systems  Psychiatric/Behavioral: Positive for depression, suicidal ideas and hallucinations. The patient is nervous/anxious.   All other systems reviewed and are negative.   Blood pressure 104/66, pulse 102, temperature 98.9 F (37.2 C), temperature source Oral, resp. rate 20, height 5' 3"  (1.6 m), weight 139.708 kg (308 lb), SpO2 96 %.Body mass index is 54.57 kg/(m^2).  General Appearance: Casual  Eye Contact::  Fair  Speech:  Clear and Coherent  Volume:  Decreased  Mood:  Anxious and Depressed  Affect:  Congruent  Thought Process:  Goal Directed  Orientation:  Full (Time, Place, and Person)  Thought Content:  Rumination  Suicidal  Thoughts:  Yes.  without intent/plan  Homicidal Thoughts:  Yes.  with intent/plan towards room mate   Memory:  Immediate;   Fair Recent;   Fair Remote;   Fair  Judgement:  Impaired  Insight:  Shallow  Psychomotor Activity:  Normal  Concentration:  Poor  Recall:  [employer] of Knowledge:Fair  Language: Fair  Akathisia:  No  Handed:  Right  AIMS (if indicated):     Assets:  Others:  access to health care  ADL's:  Intact  Cognition: WNL  Sleep:  Number of Hours: 6    Treatment Plan Summary:Pt is a 42 y old AAF , single , unemployed , has hx of MDD , PTSD , alcohol use disorder, cannabis abuse, cocaine abuse ( remission) , Opioid abuse ( remission ), who presented with worsening depression and AH. Pt continue to have SI, HI towards her room mate. Will continue to treatment. Daily contact with patient to assess and evaluate symptoms and progress in treatment and Medication management  Increase Cymbalta to 120  mg po daily for affective sx. Will continue Trazodone  100 mg po qhs for sleep. Will continue Abilify 10 mg po qhs for psychosis as well as to augment the effect of Cymbalta. Will continue Cogentin 0.5 mg po qhs for EPS. Will make available PRN medications as per agitation protocol. Will continue to monitor vitals ,medication compliance and treatment side effects while patient is here.  Will monitor for medical issues as well as call consult as needed.  Reviewed labs ,TSH - wnl, PL - elevated - pt currently on Abilify which will help reduce it, Lipid panel - wnl. EKG - wnl.  CSW will start working on disposition.  Recreational therapy consult. Patient to participate in therapeutic milieu .  Barb Shear MD 12/03/2015, 11:20 AM

## 2015-12-03 NOTE — Progress Notes (Signed)
DAR NOTE: Patient presents with anxious affect and depressed mood. Pt  Complained of generalized pain, denies auditory and visual hallucinations.  Rates depression at  5, hopelessness at 5, and anxiety at 5.  Maintained on routine safety checks.  Medications given as prescribed.  Support and encouragement offered as needed. States goal for today is " getting better."   Patient observed socializing with peers in the dayroom. Pt's safety ensured with 15 minute and environmental checks. Pt currently denies SI/HI and A/V hallucinations. Pt verbally agrees to seek staff if SI/HI or A/VH occurs and to consult with staff before acting on these thoughts. Will continue POC.

## 2015-12-03 NOTE — Progress Notes (Signed)
[facility] Group Notes:  (Nursing/MHT/Case Management/Adjunct)  Date:  12/03/2015  Time:  9:29 PM  Type of Therapy:  Psychoeducational Skills  Participation Level:  Active  Participation Quality:  Appropriate  Affect:  Blunted  Cognitive:  Appropriate  Insight:  Improving  Engagement in Group:  Developing/Improving  Modes of Intervention:  Education  Summary of Progress/Problems: Patient states that she had a good day since she felt better and because she remained in all of her groups for the entire duration. In terms of the theme of the day, her relapse prevention will include trying to think more positively.   Hazle Coca S 12/03/2015, 9:29 PM

## 2015-12-03 NOTE — Progress Notes (Signed)
DAR: Patient is safe on the unit. Hailey Klein is in a good mood today. She is smiling and pleasant during our conversation. She states her day was "Very good" and she is happy to be on her metformin again. She denies SI/HI/AVH at this time. States anxiety 7/10.  Medications administered as prescribed. Continue to monitor for safety and medication effectiveness. Q 15 minute checks continue throughout the night.

## 2015-12-04 DIAGNOSIS — F333 Major depressive disorder, recurrent, severe with psychotic symptoms: Principal | ICD-10-CM

## 2015-12-04 LAB — GLUCOSE, CAPILLARY
GLUCOSE-CAPILLARY: 161 mg/dL — AB (ref 65–99)
GLUCOSE-CAPILLARY: 280 mg/dL — AB (ref 65–99)
GLUCOSE-CAPILLARY: 226 mg/dL — AB (ref 65–99)
Glucose-Capillary: 146 mg/dL — ABNORMAL HIGH (ref 65–99)

## 2015-12-04 NOTE — Progress Notes (Signed)
Patient ID: Hailey Klein, female   DOB: [birth year], 50 y.o.   MRN: [id number] [facility] MD Progress Note  12/04/2015 12:04 PM Hailey Klein  MRN:  [id number] Subjective: Patient states " I still have SI as well as HI.  They said I was not accepted at [facility]."  Objective: Franchot Mimes, a 50 y.o. AA female, single,  unemployed , homeless has a hx of MDD as well as substance abuse (alcohol in remission, cocaine in remission, cannabis , oxycodone in remission ) who presented to the Emergency Department complaining of suicidal and homicidal thoughts.   Patient seen and chart reviewed.Discussed patient with treatment team.  Pt today continues  seen as depressed.  Pt also has passive SI, currently denies plan. Pt reports sleep as improved. Reports anxiety as good.  Pt per staff is anxious, depressed , focused on her metformin, wants to get back on it.  Tolerating medications well. Denies ADRs.  Principal Problem: MDD (major depressive disorder), recurrent, severe, with psychosis ([facility]) Diagnosis:   Patient Active Problem List   Diagnosis Date Noted  . Alcohol use disorder, moderate, in sustained remission ([facility]) [F10.21] 12/02/2015  . Cocaine use disorder, moderate, in sustained remission [F14.90] 12/02/2015  . Opioid use disorder, moderate, in sustained remission [F11.90] 12/02/2015  . Cannabis use disorder, mild, abuse [F12.10] 12/02/2015  . PTSD (post-traumatic stress disorder) [F43.10] 12/01/2015  . MDD (major depressive disorder), recurrent, severe, with psychosis ([facility]) [F33.3] 12/01/2015   Total Time spent with patient: 25 minutes  Past Psychiatric History: Patient has had several admissions to mental health facilities [facility]. Pt has hx of suicide attempt in the past - cut wrist.  Past Medical History:  Past Medical History  Diagnosis Date  . Diabetes mellitus without complication ([facility])   . Hypertension   . PTSD (post-traumatic stress disorder)   . H/O suicide attempt     cut wrists  - 19-y-o   History reviewed. No pertinent past surgical history. Family History:  Family History  Problem Relation Age of Onset  . Alcoholism Other    Family Psychiatric  History: Patient reports that several family members on her dad's side has alcoholism. Pt denies hx of mental illness, suicide in family. Social History: Pt is single , unemployed , is currently homeless. Pt denies any pending legal charges. Pt denies having children. History  Alcohol Use  . 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 Yes  . Special: "Crack" cocaine    Comment: crack cocaine binge 1 mth ago. thc over 5 years ago.     Social History   Social History  . Marital Status: Single    Spouse Name: N/A  . Number of Children: N/A  . Years of Education: N/A   Social History Main Topics  . Smoking status: Former Games developer  . Smokeless tobacco: None  . 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 Drug Use: Yes    Special: "Crack" cocaine     Comment: crack cocaine binge 1 mth ago. thc over 5 years ago.   Marland Kitchen Sexual Activity: Not Asked   Other Topics Concern  . None   Social History Narrative   Additional Social History:   Sleep: Fair  Appetite:  Fair  Current Medications: Current Facility-Administered Medications  Medication Dose  Route Frequency Provider Last Rate Last Dose  . acetaminophen (TYLENOL) tablet 650 mg  650 mg Oral Q6H PRN Thermon Leyland, NP   650 mg at 12/03/15 0840  . alum & mag hydroxide-simeth (MAALOX/MYLANTA) 200-200-20 MG/5ML suspension 30 mL  30 mL Oral Q4H PRN Thermon Leyland, NP      . ARIPiprazole (ABILIFY) tablet 10 mg  10 mg Oral QHS Jomarie Longs, MD   10 mg at 12/03/15 2151  . benztropine (COGENTIN) tablet 0.5 mg  0.5 mg Oral QHS Jomarie Longs, MD   0.5 mg at 12/03/15 2151  . DULoxetine (CYMBALTA) DR capsule 60 mg  60 mg Oral QPM Jomarie Longs, MD   60  mg at 12/03/15 1828  . DULoxetine (CYMBALTA) DR capsule 60 mg  60 mg Oral Daily Jomarie Longs, MD   60 mg at 12/04/15 0803  . gabapentin (NEURONTIN) capsule 400 mg  400 mg Oral TID Thermon Leyland, NP   400 mg at 12/04/15 1156  . glipiZIDE (GLUCOTROL) tablet 5 mg  5 mg Oral BID AC Thermon Leyland, NP   5 mg at 12/04/15 0630  . hydrochlorothiazide (HYDRODIURIL) tablet 25 mg  25 mg Oral Daily Thermon Leyland, NP   25 mg at 12/04/15 0804  . hydrOXYzine (ATARAX/VISTARIL) tablet 25 mg  25 mg Oral Q6H PRN Beau Fanny, FNP   25 mg at 12/04/15 0912  . lisinopril (PRINIVIL,ZESTRIL) tablet 20 mg  20 mg Oral Daily Thermon Leyland, NP   20 mg at 12/04/15 0803  . risperiDONE (RISPERDAL M-TABS) disintegrating tablet 2 mg  2 mg Oral Q8H PRN Kerry Hough, PA-C   2 mg at 11/30/15 2056   And  . LORazepam (ATIVAN) tablet 1 mg  1 mg Oral PRN Kerry Hough, PA-C       And  . ziprasidone (GEODON) injection 20 mg  20 mg Intramuscular PRN Kerry Hough, PA-C      . magnesium hydroxide (MILK OF MAGNESIA) suspension 30 mL  30 mL Oral Daily PRN Thermon Leyland, NP   30 mL at 12/03/15 2152  . metFORMIN (GLUCOPHAGE) tablet 1,000 mg  1,000 mg Oral BID WC Jomarie Longs, MD   1,000 mg at 12/04/15 0803  . traZODone (DESYREL) tablet 100 mg  100 mg Oral QHS Jomarie Longs, MD   100 mg at 12/03/15 2151    Lab Results:  Results for orders placed or performed during the hospital encounter of 11/30/15 (from the past 48 hour(s))  Glucose, capillary     Status: Abnormal   Collection Time: 12/03/15 11:47 AM  Result Value Ref Range   Glucose-Capillary 162 (H) 65 - 99 mg/dL  Glucose, capillary     Status: Abnormal   Collection Time: 12/04/15  5:48 AM  Result Value Ref Range   Glucose-Capillary 146 (H) 65 - 99 mg/dL    Physical Findings: AIMS: Facial and Oral Movements Muscles of Facial Expression: None, normal Lips and Perioral Area: None, normal Jaw: None, normal Tongue: None, normal,Extremity Movements Upper (arms,  wrists, hands, fingers): None, normal Lower (legs, knees, ankles, toes): None, normal, Trunk Movements Neck, shoulders, hips: None, normal, Overall Severity Severity of abnormal movements (highest score from questions above): None, normal Incapacitation due to abnormal movements: None, normal Patient's awareness of abnormal movements (rate only patient's report): No Awareness, Dental Status Current problems with teeth and/or dentures?: No Does patient usually wear dentures?: No  CIWA:    COWS:  Musculoskeletal: Strength & Muscle Tone: within normal limits Gait & Station: normal Patient leans: N/A  Psychiatric Specialty Exam: Review of Systems  Psychiatric/Behavioral: Positive for depression, suicidal ideas and hallucinations. The patient is nervous/anxious.   All other systems reviewed and are negative.   Blood pressure 119/67, pulse 91, temperature 98.5 F (36.9 C), temperature source Oral, resp. rate 20, height  (1.6 m), weight 139.708 kg (308 lb), SpO2 96 %.Body mass index is 54.57 kg/(m^2).  General Appearance: Casual  Eye Contact::  Fair  Speech:  Clear and Coherent  Volume:  Decreased  Mood:  Anxious and Depressed  Affect:  Congruent  Thought Process:  Goal Directed  Orientation:  Full (Time, Place, and Person)  Thought Content:  Rumination  Suicidal Thoughts:  Yes.  without intent/plan  Homicidal Thoughts:  Yes.  with intent/plan towards room mate   Memory:  Immediate;   Fair Recent;   Fair Remote;   Fair  Judgement:  Impaired  Insight:  Shallow  Psychomotor Activity:  Normal  Concentration:  Poor  Recall:  [employer] of Knowledge:Fair  Language: Fair  Akathisia:  No  Handed:  Right  AIMS (if indicated):     Assets:  Others:  access to health care  ADL's:  Intact  Cognition: WNL  Sleep:  Number of Hours: 5.25   Treatment Plan Summary:Pt is a 27 y old AAF , single , unemployed , has hx of MDD , PTSD , alcohol use disorder, cannabis abuse, cocaine  abuse ( remission) , Opioid abuse ( remission ), who presented with worsening depression and AH. Pt continue to have SI, HI towards her room mate. Will continue to treatment. Daily contact with patient to assess and evaluate symptoms and progress in treatment and Medication management  Increase Cymbalta to 120  mg po daily for affective sx. Will continue Trazodone  100 mg po qhs for sleep. Will continue Abilify 10 mg po qhs for psychosis as well as to augment the effect of Cymbalta. Will continue Cogentin 0.5 mg po qhs for EPS. Will make available PRN medications as per agitation protocol. Will continue to monitor vitals ,medication compliance and treatment side effects while patient is here.  Will monitor for medical issues as well as call consult as needed.  Reviewed labs ,TSH - wnl, PL - elevated - pt currently on Abilify which will help reduce it, Lipid panel - wnl. EKG - wnl.  CSW will start working on disposition.  Recreational therapy consult. Patient to participate in therapeutic milieu.  Velna Hatchet May Agustin  AGNP-BC 12/04/2015, 12:04 PM Agree with NP Progress Note, as above

## 2015-12-04 NOTE — Progress Notes (Signed)
[facility] Group Notes:  (Nursing/MHT/Case Management/Adjunct)  Date:  12/04/2015  Time:  9:51 PM  Type of Therapy:  Psychoeducational Skills  Participation Level:  Active  Participation Quality:  Appropriate  Affect:  Appropriate  Cognitive:  Appropriate  Insight:  Appropriate  Engagement in Group:  Developing/Improving  Modes of Intervention:  Education  Summary of Progress/Problems: Patient states that she had a good day as a whole. She states that she had a good talk with her doctor and that she had a good laugh with her peers. In terms of the theme for the day, her coping skill is "getting healthy".   Hazle Coca S 12/04/2015, 9:51 PM

## 2015-12-04 NOTE — Progress Notes (Signed)
D:Per patient self inventory form pt reports she slept poor last night with the use of sleep medication. Pt reports a good appetite, low energy level, poor concentration. Pt rates depression 6/10, hopelessness 6/10, anxiety 6/10- all on 0-10 scale, 10 being the worse. Pt reports passive SI, but can verbally contract for safety. Denies plan to hurt self and reports she feels safe talking to staff. Pt reports her goal is "focus and concentration" and that she will meet her goal by "attend groups"  A:Special checks q 15 mins in place for safety. Medication administered per MD order (see eMAR) Encouragement and support provide.d   R:Safety maintained. Pt verbally contracts for safety. Attending groups on the unit. Will continue to monitor.

## 2015-12-04 NOTE — [facility] Group Notes (Signed)
[facility] Group Notes:  (Nursing/MHT/Case Management/Adjunct)  Date:  12/04/2015  Time:1100  Type of Therapy:  Nurse Education  Participation Level:  Active  Participation Quality:  Attentive  Affect:  Flat  Cognitive:  Alert  Insight:  Good  Engagement in Group:  Engaged  Modes of Intervention:  Activity, Clarification, Discussion, Socialization and Support  Summary of Progress/Problems:  Hailey Klein 12/04/2015, 2:38 PM

## 2015-12-04 NOTE — [facility] Group Notes (Signed)
[facility] Group Notes:  (Clinical Social Work)  12/04/2015  9:00-9:45AM  Summary of Progress/Problems:   Today's process group involved patients discussing their feelings related to being hospitalized, as well as how they can use their present feelings to create a goal for the day. The patient expressed her primary feeling about being hospitalized is "scared" and said she is like some of the other people in group who talked about having auditory hallucinations.  She needs help to get rid of the voices and calm her mind.    Type of Therapy:  Group Therapy - Process  Participation Level:  Active  Participation Quality:  Attentive and Sharing  Affect:  Blunted  Cognitive:  Hallucinating  Insight:  Developing/Improving  Engagement in Therapy:  Engaged  Modes of Intervention:  Exploration, Discussion  Ambrose Mantle, LCSW 12/04/2015, 1:05 PM

## 2015-12-04 NOTE — Progress Notes (Signed)
Patient has been up in the dayroom watching tv and interacting appropriately with peers. She reports that she has had a good day. She is planning on going to [place] or a women's home in [place] once discharged. She reports that the voices are still there but not as loud. Support givens and safety maintain on unit with 15 min checks.

## 2015-12-05 LAB — GLUCOSE, CAPILLARY: GLUCOSE-CAPILLARY: 123 mg/dL — AB (ref 65–99)

## 2015-12-05 NOTE — [facility] Group Notes (Signed)
[facility] Group Notes:  (Clinical Social Work)  12/05/2015  9:15AM-10:20AM  Summary of Progress/Problems:  The main focus of today's process group was to listen to a variety of genres of music and to identify that different types of music provoke different responses.  The patient then was able to identify personally what was soothing, energizing, depressing, inspiring.   The patient expressed understanding of concepts, as well as knowledge of how each type of music affected her and how this can be used at home as a wellness/recovery tool.  She participated continuously throughout group and at the end of group said she felt much better than she did before.  Type of Therapy:  Music Therapy   Participation Level:  Active  Participation Quality:  Attentive and Sharing  Affect:  Blunted  Cognitive:  Oriented  Insight:  Engaged  Engagement in Therapy:  Engaged  Modes of Intervention:   Activity, Exploration  Ambrose Mantle, LCSW 12/05/2015 12:58 PM

## 2015-12-05 NOTE — Progress Notes (Signed)
[facility] MD Progress Note  12/05/2015 2:48 PM Karsyn Jamie  MRN:  [id number] Subjective: Patient states "I'm about the same.  Still depressed."  Objective: Arilla Hice, a 50 y.o. AA female, single,  unemployed , homeless has a hx of MDD as well as substance abuse (alcohol in remission, cocaine in remission, cannabis , oxycodone in remission ) who presented to the Emergency Department complaining of suicidal and homicidal thoughts.   Patient seen and chart reviewed.Discussed patient with treatment team.  Pt today continues  seen as depressed.  Pt also has passive SI, currently denies plan.  No disruptive behaviors on unit. Pt reports sleep as improved. Reports anxiety as good. Tolerating medications well. Denies ADRs.  Principal Problem: MDD (major depressive disorder), recurrent, severe, with psychosis (HCC) Diagnosis:   Patient Active Problem List   Diagnosis Date Noted  . Alcohol use disorder, moderate, in sustained remission (HCC) [F10.21] 12/02/2015  . Cocaine use disorder, moderate, in sustained remission [F14.90] 12/02/2015  . Opioid use disorder, moderate, in sustained remission [F11.90] 12/02/2015  . Cannabis use disorder, mild, abuse [F12.10] 12/02/2015  . PTSD (post-traumatic stress disorder) [F43.10] 12/01/2015  . MDD (major depressive disorder), recurrent, severe, with psychosis (HCC) [F33.3] 12/01/2015   Total Time spent with patient: 25 minutes  Past Psychiatric History: Patient has had several admissions to mental health facilities [facility]. Pt has hx of suicide attempt in the past - cut wrist.  Past Medical History:  Past Medical History  Diagnosis Date  . Diabetes mellitus without complication (HCC)   . Hypertension   . PTSD (post-traumatic stress disorder)   . H/O suicide attempt     cut wrists - 19-y-o   History reviewed. No pertinent past surgical history. Family History:  Family History  Problem Relation Age of Onset  . Alcoholism Other    Family  Psychiatric  History: Patient reports that several family members on her dad's side has alcoholism. Pt denies hx of mental illness, suicide in family. Social History: Pt is single , unemployed , is currently homeless. Pt denies any pending legal charges. Pt denies having children. History  Alcohol Use  . 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 Yes  . Special: "Crack" cocaine    Comment: crack cocaine binge 1 mth ago. thc over 5 years ago.     Social History   Social History  . Marital Status: Single    Spouse Name: N/A  . Number of Children: N/A  . Years of Education: N/A   Social History Main Topics  . Smoking status: Former Games developer  . Smokeless tobacco: None  . 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 Drug Use: Yes    Special: "Crack" cocaine     Comment: crack cocaine binge 1 mth ago. thc over 5 years ago.   Marland Kitchen Sexual Activity: Not Asked   Other Topics Concern  . None   Social History Narrative   Additional Social History:   Sleep: Fair  Appetite:  Fair  Current Medications: Current Facility-Administered Medications  Medication Dose Route Frequency Provider Last Rate Last Dose  . acetaminophen (TYLENOL) tablet 650 mg  650 mg Oral Q6H PRN Thermon Leyland, NP   650 mg at 12/04/15 2200  . alum & mag hydroxide-simeth (MAALOX/MYLANTA) 200-200-20  MG/5ML suspension 30 mL  30 mL Oral Q4H PRN Thermon Leyland, NP      . ARIPiprazole (ABILIFY) tablet 10 mg  10 mg Oral QHS Jomarie Longs, MD   10 mg at 12/04/15 2200  . benztropine (COGENTIN) tablet 0.5 mg  0.5 mg Oral QHS Saramma Eappen, MD   0.5 mg at 12/04/15 2200  . DULoxetine (CYMBALTA) DR capsule 60 mg  60 mg Oral QPM Saramma Eappen, MD   60 mg at 12/04/15 1719  . DULoxetine (CYMBALTA) DR capsule 60 mg  60 mg Oral Daily Jomarie Longs, MD   60 mg at 12/05/15 0740  . gabapentin (NEURONTIN) capsule 400 mg   400 mg Oral TID Thermon Leyland, NP   400 mg at 12/05/15 1138  . glipiZIDE (GLUCOTROL) tablet 5 mg  5 mg Oral BID AC Thermon Leyland, NP   5 mg at 12/05/15 1610  . hydrochlorothiazide (HYDRODIURIL) tablet 25 mg  25 mg Oral Daily Thermon Leyland, NP   25 mg at 12/05/15 0740  . hydrOXYzine (ATARAX/VISTARIL) tablet 25 mg  25 mg Oral Q6H PRN Beau Fanny, FNP   25 mg at 12/05/15 0743  . lisinopril (PRINIVIL,ZESTRIL) tablet 20 mg  20 mg Oral Daily Thermon Leyland, NP   20 mg at 12/05/15 0740  . risperiDONE (RISPERDAL M-TABS) disintegrating tablet 2 mg  2 mg Oral Q8H PRN Kerry Hough, PA-C   2 mg at 11/30/15 2056   And  . LORazepam (ATIVAN) tablet 1 mg  1 mg Oral PRN Kerry Hough, PA-C       And  . ziprasidone (GEODON) injection 20 mg  20 mg Intramuscular PRN Kerry Hough, PA-C      . magnesium hydroxide (MILK OF MAGNESIA) suspension 30 mL  30 mL Oral Daily PRN Thermon Leyland, NP   30 mL at 12/03/15 2152  . metFORMIN (GLUCOPHAGE) tablet 1,000 mg  1,000 mg Oral BID WC Jomarie Longs, MD   1,000 mg at 12/05/15 0740  . traZODone (DESYREL) tablet 100 mg  100 mg Oral QHS Jomarie Longs, MD   100 mg at 12/04/15 2200    Lab Results:  Results for orders placed or performed during the hospital encounter of 11/30/15 (from the past 48 hour(s))  Glucose, capillary     Status: Abnormal   Collection Time: 12/03/15  5:00 PM  Result Value Ref Range   Glucose-Capillary 280 (H) 65 - 99 mg/dL  Glucose, capillary     Status: Abnormal   Collection Time: 12/04/15  5:48 AM  Result Value Ref Range   Glucose-Capillary 146 (H) 65 - 99 mg/dL  Glucose, capillary     Status: Abnormal   Collection Time: 12/04/15 11:59 AM  Result Value Ref Range   Glucose-Capillary 226 (H) 65 - 99 mg/dL  Glucose, capillary     Status: Abnormal   Collection Time: 12/05/15  5:56 AM  Result Value Ref Range   Glucose-Capillary 123 (H) 65 - 99 mg/dL    Physical Findings: AIMS: Facial and Oral Movements Muscles of Facial Expression:  None, normal Lips and Perioral Area: None, normal Jaw: None, normal Tongue: None, normal,Extremity Movements Upper (arms, wrists, hands, fingers): None, normal Lower (legs, knees, ankles, toes): None, normal, Trunk Movements Neck, shoulders, hips: None, normal, Overall Severity Severity of abnormal movements (highest score from questions above): None, normal Incapacitation due to abnormal movements: None, normal Patient's awareness of abnormal movements (rate only patient's report): No Awareness, Dental Status  Current problems with teeth and/or dentures?: No Does patient usually wear dentures?: No  CIWA:    COWS:     Musculoskeletal: Strength & Muscle Tone: within normal limits Gait & Station: normal Patient leans: N/A  Psychiatric Specialty Exam: Review of Systems  Psychiatric/Behavioral: Positive for depression, suicidal ideas and hallucinations. The patient is nervous/anxious.   All other systems reviewed and are negative.   Blood pressure 122/72, pulse 89, temperature 98.5 F (36.9 C), temperature source Oral, resp. rate 20, height  (1.6 m), weight 139.708 kg (308 lb), SpO2 96 %.Body mass index is 54.57 kg/(m^2).  General Appearance: Casual  Eye Contact::  Fair  Speech:  Clear and Coherent  Volume:  Decreased  Mood:  Anxious and Depressed  Affect:  Congruent  Thought Process:  Goal Directed  Orientation:  Full (Time, Place, and Person)  Thought Content:  Rumination  Suicidal Thoughts:  Yes.  without intent/plan  Homicidal Thoughts:  Yes.  with intent/plan towards room mate   Memory:  Immediate;   Fair Recent;   Fair Remote;   Fair  Judgement:  Impaired  Insight:  Shallow  Psychomotor Activity:  Normal  Concentration:  Poor  Recall:  [employer] of Knowledge:Fair  Language: Fair  Akathisia:  No  Handed:  Right  AIMS (if indicated):     Assets:  Others:  access to health care  ADL's:  Intact  Cognition: WNL  Sleep:  Number of Hours: 5.5   Treatment Plan  Summary:Pt is a 83 y old AAF , single , unemployed , has hx of MDD , PTSD , alcohol use disorder, cannabis abuse, cocaine abuse ( remission) , Opioid abuse ( remission ), who presented with worsening depression and AH. Pt continue to have SI, HI towards her room mate. Will continue to treatment. Daily contact with patient to assess and evaluate symptoms and progress in treatment and Medication management  Continue Cymbalta 120  mg po daily for affective sx. Will continue Trazodone  100 mg po qhs for sleep. Will continue Abilify 10 mg po qhs for psychosis as well as to augment the effect of Cymbalta. Will continue Cogentin 0.5 mg po qhs for EPS. Will make available PRN medications as per agitation protocol. Will continue to monitor vitals ,medication compliance and treatment side effects while patient is here.  Will monitor for medical issues as well as call consult as needed.  Reviewed labs ,TSH - wnl, PL - elevated - pt currently on Abilify which will help reduce it, Lipid panel - wnl. EKG - wnl.  CSW will start working on disposition.  Recreational therapy consult. Patient to participate in therapeutic milieu.  Velna Hatchet May Agustin  AGNP-BC 12/05/2015, 2:48 PM Agree with NP Progress Note, as above

## 2015-12-05 NOTE — Progress Notes (Signed)
The patient attended the evening A.A. Meeting.

## 2015-12-05 NOTE — Plan of Care (Signed)
Problem: Diagnosis: Increased Risk For Suicide Attempt Goal: STG-Patient Will Comply With Medication Regime Outcome: Progressing Pt compliant with prescribed medication regimen.      

## 2015-12-05 NOTE — Progress Notes (Signed)
Hailey Klein has been up in the dayroom watching tv most of the night. She attended group and participated. Writer spoke with her 1:1 and she appeared sad when Clinical research associate was talking to her. She reports that she is worried because she knows that she needs to find a job and a place to live once discharged. 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 Clinical research associate encouraged her to look into this and possibly consider other job options. She reports passive si and verbally contracts for safety, denies hi/a/v hallucinations. Safety maintained on unit with 15 min checks.

## 2015-12-05 NOTE — Progress Notes (Signed)
D:Per patient self inventory form pt reports she slept good last night with the use of sleep medication. Pt reports a good appetite, normal energy level, poor concentration. Pt rates depression 6/10, hopelessness 6/10, anxiety 6/10- all on 0-10 scale, 10 being the worse.  Pt reports passive SI, but verbally contracts for safety. Pt denies plan to hurt self and reports she feels safe at the hospital. Pt denies HI. Pt reports her goal is "focusing" and that she will meet her goal by "attending groups"  A:Special checks q 15 mins in place for safety. Medication administered per MD order (See eMAR) Encouragement and support provided.  R:Safety mainted. Compliant with medication regimen. Attending groups on the unit. Will continue to monitor.

## 2015-12-05 NOTE — [facility] Group Notes (Signed)
[facility] Group Notes:  (Nursing/MHT/Case Management/Adjunct)  Date:  12/05/2015  Time: 1300  Type of Therapy:  Nurse Education  Participation Level:  Active  Participation Quality:  Appropriate and Attentive  Affect:  Appropriate  Cognitive:  Alert and Appropriate  Insight:  Good  Engagement in Group:  Engaged  Modes of Intervention:  Activity, Clarification, Socialization and Support  Summary of Progress/Problems:  Hailey Klein 12/05/2015, 3:28 PM

## 2015-12-06 LAB — GLUCOSE, CAPILLARY: GLUCOSE-CAPILLARY: 121 mg/dL — AB (ref 65–99)

## 2015-12-06 MED ORDER — NICOTINE POLACRILEX 2 MG MT GUM
2.0000 mg | CHEWING_GUM | OROMUCOSAL | Status: DC | PRN
  Administered 2015-12-06 – 2015-12-08 (×4): 2 mg via ORAL
  Filled 2015-12-06 (×4): qty 1

## 2015-12-06 MED ORDER — ARIPIPRAZOLE 15 MG PO TABS
15.0000 mg | ORAL_TABLET | Freq: Every day | ORAL | Status: DC
  Administered 2015-12-06: 15 mg via ORAL
  Filled 2015-12-06 (×3): qty 1

## 2015-12-06 NOTE — [facility] Group Notes (Signed)
[facility] LCSW Aftercare Discharge Planning Group Note   12/06/2015 4:41 PM  Participation Quality:  Minimal  Mood/Affect:  Depressed  Depression Rating:  8  Anxiety Rating:  7  Thoughts of Suicide:  Yes Will you contract for safety?   Yes  Current AVH:  Yes  Plan for Discharge/Comments:  States she was unable to get into [facility] due to diabetes.  Feeling hopeless.  Willing to call [facility] after group to see if she can get into their shelter. Also willing to call [facility] to see if she can get in there.  Transportation Means:   Supports:  Daryel Gerald B

## 2015-12-06 NOTE — Progress Notes (Addendum)
DAR Note: Hailey Klein has been visible on the unit.  She continues to report that she she has suicidal thoughts no plan or intent.  She is able to contract for safety.  She continues to report that she hears voices that tell her to harm herself but "I try to ignore them."  She has been attending groups.  Interacting with peers.  BP low this am and held Lisinopril and informed MD.  She completed her self inventory and reports that her depression, hopelessness and anxiety are 6/10.  She states that her goal for today is "concentrating and focus" and she states that she will accomplish her goal by "attending groups."  Encouraged continued participation in group and unit activities.  Q 15 minute checks maintained for safety.  We will continue to monitor the progress towards her goals.

## 2015-12-06 NOTE — Progress Notes (Signed)
Adult Psychoeducational Group Note  Date:  12/06/2015 Time:  8:50 PM  Group Topic/Focus:  Wrap-Up Group:   The focus of this group is to help patients review their daily goal of treatment and discuss progress on daily workbooks.  Participation Level:  Active  Participation Quality:  Appropriate and Attentive  Affect:  Appropriate  Cognitive:  Alert and Appropriate  Insight: Appropriate and Good  Engagement in Group:  Engaged  Modes of Intervention:  Discussion  Additional Comments:  Pt rated her day a "5" out of 10. Pt mentioned she has been depressed today and aggravated. Pt goals for tomorrow is to attend all the groups and hopefully they will make her feel better.   Merlinda Frederick 12/06/2015, 8:50 PM

## 2015-12-06 NOTE — Plan of Care (Signed)
Problem: Diagnosis: Increased Risk For Suicide Attempt Goal: STG-Patient Will Attend All Groups On The Unit Outcome: Progressing Patient attended group on 12/05/15 and participated.

## 2015-12-06 NOTE — Tx Team (Signed)
Interdisciplinary Treatment Plan Update (Adult)  Date:  12/06/2015 Time Reviewed:  1:02 PM  Progress in Treatment: Attending groups: Yes. Participating in groups: Yes. Taking medication as prescribed:  Yes. Tolerating medication:  Yes. Family/Significant other contact made:  No, will contact:  Sherri Sear (sister) [phone] Patient understands diagnosis:  Yes, as evidenced by seeking help with HI, SI, and AVH. Discussing patient identified problems/goals with staff:  Yes, see initial care plan. Medical problems stabilized or resolved:  Yes Denies suicidal/homicidal ideation: Yes. Issues/concerns per patient self-inventory: No. Other:  New problem(s) identified:   Discharge Plan or Barriers: See below  Reason for Continuation of Hospitalization: Depression Hallucinations Homicidal ideation Medication stabilization Suicidal ideation  Comments: Hailey Klein is a 50 y.o. female who presents to the Emergency Department complaining of suicidal and homicidal thoughts. She is here for 1 week of suicidal and homicidal thoughts. These thoughts began after her roommate kicked her out when she (the roommate) was caught doing crack. Patients states that she wants to run in traffic and smashed the roommate's head and with a hammer. The patient came in for treatment after talking to her psychiatrist. She denies any recent illnesses. She denies any alcohol or drug use. Symptoms are moderate and constant. Cymbalta, Neurontin trial   12/06/15: Pt continue to have SI, as well as has command Candler asking her to kills self. Will continue to treatment. Daily contact with patient to assess and evaluate symptoms and progress in treatment and Medication management  Continue Cymbalta 120 mg po daily for affective sx.Will consider adding another antidepressant like Remeron if she continues to feel depressed. However , her depression is mostly due to her situation and being hopeless about it. Will  continue Trazodone 100 mg po qhs for sleep. Will increase Abilify to 15 mg po qhs for psychosis as well as to augment the effect of Cymbalta. Will continue Cogentin 0.5 mg po qhs for EPS.   Estimated length of stay: 4-5 days  New goal(s):  Review of initial/current patient goals per problem list:  1. Goal(s): Patient will participate in aftercare plan  Met: No  Target date: at discharge  As evidenced by: Patient will participate within aftercare plan AEB aftercare provider and housing plan at discharge being identified. 12/01/15: Pt will follow-up outpt with [facility]. Pt needs help securing housing. 12/06/15:  [facility] said no, [facility] had no beds today, and she has been trying to reach [employer]  2. Goal (s): Patient will exhibit decreased depressive symptoms and suicidal ideations.  Met:No  Target date: at discharge  As evidenced by: Patient will utilize self rating of depression at 3 or below and demonstrate decreased signs of depression or be deemed stable for discharge by MD.  12/01/15: Pt rates depression as 10/10 today. Will continue to assess.  12/06/15: Pt rates depression an 8 today.  3. Goal(s): Patient will demonstrate decreased signs and symptoms of anxiety.  Met:No  Target date: at discharge  As evidenced by: Patient will utilize self rating of anxiety at 3 or below and demonstrated decreased signs of anxiety, or be deemed stable for discharge by MD  12/01/15: Pt rates anxiety as 10/10 today. Will continue to assess.  12/06/15:  Pt rates anxiety a 7 today.  4. Goal(s): Patient will demonstrate decreased signs of psychosis.  Met: No  Target date:at discharge  As evidenced by: Patient will demonstrate decreased signs of psychosis as evidenced by a reduction in AVH, paranoia, and/or delusions.  12/01/15: Pt endorses  AVH.  12/06/15:  Endorsing command hallucinations today.   Attendees: Patient:  12/06/2015 1:02 PM  Family:    12/06/2015 1:02 PM  Physician:  Dr. Ursula Alert, MD 12/06/2015 1:02 PM  Nursing:   Mayra Neer, RN 12/06/2015 1:02 PM  Case Manager:  Roque Lias, LCSW 12/06/2015 1:02 PM  Counselor:  Matthew Saras, MSW Intern 12/06/2015 1:02 PM  Other:   12/06/2015 1:02 PM  Other:   12/06/2015 1:02 PM  Other:   12/06/2015 1:02 PM  Other:  12/06/2015 1:02 PM  Other:    Other:    Other:    Other:    Other:    Other:      Scribe for Treatment Team:   Georga Kaufmann, MSW Intern 12/06/2015 1:02 PM

## 2015-12-06 NOTE — Progress Notes (Signed)
[facility] MD Progress Note  12/06/2015 2:09 PM Hailey Klein  MRN:  [id number] Subjective: Patient states "I still feel depressed and I feel like ending it , since I feel hopeless about my situation."   Objective: Hailey Klein, a 50 y.o. AA female, single,  unemployed , homeless has a hx of MDD as well as substance abuse (alcohol in remission, cocaine in remission, cannabis , oxycodone in remission ) who presented to the Emergency Department complaining of suicidal and homicidal thoughts.   Patient seen and chart reviewed.Discussed patient with treatment team.  Pt today continues to be seen as depressed.  Pt also with AH , command asking her to kill self. Pt has SI , denies plan. Pt is tolerating her medications well. Will continue treatment.    Principal Problem: MDD (major depressive disorder), recurrent, severe, with psychosis (HCC) Diagnosis:   Patient Active Problem List   Diagnosis Date Noted  . Alcohol use disorder, moderate, in sustained remission (HCC) [F10.21] 12/02/2015  . Cocaine use disorder, moderate, in sustained remission [F14.90] 12/02/2015  . Opioid use disorder, moderate, in sustained remission [F11.90] 12/02/2015  . Cannabis use disorder, mild, abuse [F12.10] 12/02/2015  . PTSD (post-traumatic stress disorder) [F43.10] 12/01/2015  . MDD (major depressive disorder), recurrent, severe, with psychosis (HCC) [F33.3] 12/01/2015   Total Time spent with patient: 25 minutes  Past Psychiatric History: Patient has had several admissions to mental health facilities [facility]. Pt has hx of suicide attempt in the past - cut wrist.  Past Medical History:  Past Medical History  Diagnosis Date  . Diabetes mellitus without complication (HCC)   . Hypertension   . PTSD (post-traumatic stress disorder)   . H/O suicide attempt     cut wrists - 19-y-o   History reviewed. No pertinent past surgical history. Family History:  Family History  Problem Relation Age of Onset  .  Alcoholism Other    Family Psychiatric  History: Patient reports that several family members on her dad's side has alcoholism. Pt denies hx of mental illness, suicide in family. Social History: Pt is single , unemployed , is currently homeless. Pt denies any pending legal charges. Pt denies having children. History  Alcohol Use  . 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 Yes  . Special: "Crack" cocaine    Comment: crack cocaine binge 1 mth ago. thc over 5 years ago.     Social History   Social History  . Marital Status: Single    Spouse Name: N/A  . Number of Children: N/A  . Years of Education: N/A   Social History Main Topics  . Smoking status: Former Games developer  . Smokeless tobacco: None  . 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 Drug Use: Yes    Special: "Crack" cocaine     Comment: crack cocaine binge 1 mth ago. thc over 5 years ago.   Marland Kitchen Sexual Activity: Not Asked   Other Topics Concern  . None   Social History Narrative   Additional Social History:   Sleep: Fair  Appetite:  Fair  Current Medications: Current Facility-Administered Medications  Medication Dose Route Frequency Provider Last Rate Last Dose  . acetaminophen (TYLENOL) tablet 650 mg  650 mg Oral Q6H PRN Thermon Leyland, NP  650 mg at 12/06/15 0809  . alum & mag hydroxide-simeth (MAALOX/MYLANTA) 200-200-20 MG/5ML suspension 30 mL  30 mL Oral Q4H PRN Thermon Leyland, NP      . ARIPiprazole (ABILIFY) tablet 15 mg  15 mg Oral QHS Jamani Eley, MD      . benztropine (COGENTIN) tablet 0.5 mg  0.5 mg Oral QHS Jager Koska, MD   0.5 mg at 12/05/15 2210  . DULoxetine (CYMBALTA) DR capsule 60 mg  60 mg Oral QPM Girl Schissler, MD   60 mg at 12/05/15 1704  . DULoxetine (CYMBALTA) DR capsule 60 mg  60 mg Oral Daily Jomarie Longs, MD   60 mg at 12/06/15 0806  . gabapentin (NEURONTIN)  capsule 400 mg  400 mg Oral TID Thermon Leyland, NP   400 mg at 12/06/15 1202  . glipiZIDE (GLUCOTROL) tablet 5 mg  5 mg Oral BID AC Thermon Leyland, NP   5 mg at 12/06/15 1610  . hydrochlorothiazide (HYDRODIURIL) tablet 25 mg  25 mg Oral Daily Thermon Leyland, NP   25 mg at 12/06/15 0806  . hydrOXYzine (ATARAX/VISTARIL) tablet 25 mg  25 mg Oral Q6H PRN Beau Fanny, FNP   25 mg at 12/06/15 9604  . lisinopril (PRINIVIL,ZESTRIL) tablet 20 mg  20 mg Oral Daily Thermon Leyland, NP   Stopped at 12/06/15 [phone]  . risperiDONE (RISPERDAL M-TABS) disintegrating tablet 2 mg  2 mg Oral Q8H PRN Kerry Hough, PA-C   2 mg at 11/30/15 2056   And  . LORazepam (ATIVAN) tablet 1 mg  1 mg Oral PRN Kerry Hough, PA-C       And  . ziprasidone (GEODON) injection 20 mg  20 mg Intramuscular PRN Kerry Hough, PA-C      . magnesium hydroxide (MILK OF MAGNESIA) suspension 30 mL  30 mL Oral Daily PRN Thermon Leyland, NP   30 mL at 12/03/15 2152  . metFORMIN (GLUCOPHAGE) tablet 1,000 mg  1,000 mg Oral BID WC Jomarie Longs, MD   1,000 mg at 12/06/15 0806  . traZODone (DESYREL) tablet 100 mg  100 mg Oral QHS Jomarie Longs, MD   100 mg at 12/05/15 2211    Lab Results:  Results for orders placed or performed during the hospital encounter of 11/30/15 (from the past 48 hour(s))  Glucose, capillary     Status: Abnormal   Collection Time: 12/05/15  5:56 AM  Result Value Ref Range   Glucose-Capillary 123 (H) 65 - 99 mg/dL  Glucose, capillary     Status: Abnormal   Collection Time: 12/06/15  5:47 AM  Result Value Ref Range   Glucose-Capillary 121 (H) 65 - 99 mg/dL    Physical Findings: AIMS: Facial and Oral Movements Muscles of Facial Expression: None, normal Lips and Perioral Area: None, normal Jaw: None, normal Tongue: None, normal,Extremity Movements Upper (arms, wrists, hands, fingers): None, normal Lower (legs, knees, ankles, toes): None, normal, Trunk Movements Neck, shoulders, hips: None, normal, Overall  Severity Severity of abnormal movements (highest score from questions above): None, normal Incapacitation due to abnormal movements: None, normal Patient's awareness of abnormal movements (rate only patient's report): No Awareness, Dental Status Current problems with teeth and/or dentures?: No Does patient usually wear dentures?: No  CIWA:    COWS:     Musculoskeletal: Strength & Muscle Tone: within normal limits Gait & Station: normal Patient leans: N/A  Psychiatric Specialty Exam: Review of Systems  Psychiatric/Behavioral: Positive for depression, suicidal ideas  and hallucinations. The patient is nervous/anxious.   All other systems reviewed and are negative.   Blood pressure 90/51, pulse 88, temperature 98.5 F (36.9 C), temperature source Oral, resp. rate 18, height  (1.6 m), weight 139.708 kg (308 lb), SpO2 96 %.Body mass index is 54.57 kg/(m^2).  General Appearance: Casual  Eye Contact::  Fair  Speech:  Clear and Coherent  Volume:  Decreased  Mood:  Anxious and Depressed  Affect:  Congruent  Thought Process:  Goal Directed  Orientation:  Full (Time, Place, and Person)  Thought Content:  Hallucinations: Auditory Command:  Kill self, Paranoid Ideation and Rumination  Suicidal Thoughts:  Yes.  without intent/plan has command AH ASKING HER TO KILL SELF.  Homicidal Thoughts:  No   Memory:  Immediate;   Fair Recent;   Fair Remote;   Fair  Judgement:  Impaired  Insight:  Shallow  Psychomotor Activity:  Normal  Concentration:  Poor  Recall:  [employer] of Knowledge:Fair  Language: Fair  Akathisia:  No  Handed:  Right  AIMS (if indicated):     Assets:  Others:  access to health care  ADL's:  Intact  Cognition: WNL  Sleep:  Number of Hours: 5.5   Treatment Plan Summary:Pt is a 65 y old AAF , single , unemployed , has hx of MDD , PTSD , alcohol use disorder, cannabis abuse, cocaine abuse ( remission) , Opioid abuse ( remission ), who presented with worsening  depression and AH. Pt continue to have SI, as well as has command AH asking her to kills self. Will continue to treatment. Daily contact with patient to assess and evaluate symptoms and progress in treatment and Medication management  Continue Cymbalta 120  mg po daily for affective sx.Will consider adding another antidepressant like Remeron if she continues to feel depressed. However , her depression is mostly due to her situation and being hopeless about it. Will continue Trazodone  100 mg po qhs for sleep. Will increase Abilify to 15 mg po qhs for psychosis as well as to augment the effect of Cymbalta. Will continue Cogentin 0.5 mg po qhs for EPS. Will make available PRN medications as per agitation protocol. Will continue to monitor vitals ,medication compliance and treatment side effects while patient is here.  Will monitor for medical issues as well as call consult as needed.  Reviewed labs ,TSH - wnl, PL - elevated - pt currently on Abilify which will help reduce it, Lipid panel - wnl. EKG - wnl.  CSW will start working on disposition.  Recreational therapy consult. Patient to participate in therapeutic milieu.  Hailey Wyse  MD 12/06/2015, 2:09 PM

## 2015-12-06 NOTE — Progress Notes (Signed)
Recreation Therapy Notes  01.23.2017 LRT met with patient to follow up on stress management techniques introduced in previous 1:1. Patient stated she has found breathing to be helpful over the weekend. Patient agreeable to participating in progressive muscle relaxation. Patient participated in progressive muscle relaxation without issue and stated she felt a marked difference in her physical tension and felt her emotional tension ease as well. Patient agreed to continue to practice throughout the day. 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 Patient stated she would call them right away to get an appointment. Patient additionally stated that she did not know where she was going to live upon d/c. LRT encouraged patient to focus on living arrangements prior to focusing her attention on work. Additionally patient is unaware of d/c date, so she was encouraged to wait until she knows when she is going home to schedule an appoint with [facility]. Patient agreeable to this.   Laureen Ochs Suhey Radford, LRT/CTRS   Gelsey Amyx L 12/06/2015 3:21 PM

## 2015-12-06 NOTE — Plan of Care (Signed)
Problem: Diagnosis: Increased Risk For Suicide Attempt Goal: LTG-Patient Will Report Improved Mood and Deny Suicidal LTG (by discharge) Patient will report improved mood and deny suicidal ideation.  Outcome: Not Progressing Patient reports passive suicidal ideations and agrees to notify staff if thoughts increase and she want to act upon her thoughts

## 2015-12-06 NOTE — [facility] Group Notes (Signed)
[facility] Group Notes:  (Counselor/Nursing/MHT/Case Management/Adjunct)  12/06/2015 1:15PM  Type of Therapy:  Group Therapy  Participation Level:  Active  Participation Quality:  Appropriate  Affect:  Flat  Cognitive:  Oriented  Insight:  Improving  Engagement in Group:  Limited  Engagement in Therapy:  Limited  Modes of Intervention:  Discussion, Exploration and Socialization  Summary of Progress/Problems: The topic for group was balance in life.  Pt participated in the discussion about when their life was in balance and out of balance and how this feels.  Pt discussed ways to get back in balance and short term goals they can work on to get where they want to be. Stayed the entire time, engaged throughout.  Stated she is unbalanced and knows this because she is depressed.  Unable to identify anything that helps her find balance.  Helpless and hopeless.  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  She responded positively to this.   Hailey Klein 12/06/2015 4:45 PM

## 2015-12-07 LAB — GLUCOSE, CAPILLARY: GLUCOSE-CAPILLARY: 151 mg/dL — AB (ref 65–99)

## 2015-12-07 MED ORDER — ARIPIPRAZOLE 10 MG PO TABS
20.0000 mg | ORAL_TABLET | Freq: Every day | ORAL | Status: DC
  Administered 2015-12-07 – 2015-12-08 (×2): 20 mg via ORAL
  Filled 2015-12-07: qty 6
  Filled 2015-12-07 (×3): qty 2

## 2015-12-07 NOTE — Progress Notes (Signed)
[facility] MD Progress Note  12/07/2015 2:15 PM Hailey Klein  MRN:  [id number] Subjective: Patient states "I still hear AH asking me to kill myself. I am trying to ignore it. But I do have SI .'   Objective: Hailey Klein, a 50 y.o. AA female, single,  unemployed , homeless has a hx of MDD as well as substance abuse (alcohol in remission, cocaine in remission, cannabis , oxycodone in remission ) who presented to the Emergency Department complaining of suicidal and homicidal thoughts.   Patient seen and chart reviewed.Discussed patient with treatment team.  Pt today continues to be seen as depressed.  Pt also with AH , command asking her to kill self. Pt continues to have SI , denies plan at this time. Pt is tolerating her medications well. Per staff pt continues to endorse command AH and has SI, is compliant on medications. Denies ADRs. Will continue treatment.    Principal Problem: MDD (major depressive disorder), recurrent, severe, with psychosis (HCC) Diagnosis:   Patient Active Problem List   Diagnosis Date Noted  . Alcohol use disorder, moderate, in sustained remission (HCC) [F10.21] 12/02/2015  . Cocaine use disorder, moderate, in sustained remission [F14.90] 12/02/2015  . Opioid use disorder, moderate, in sustained remission [F11.90] 12/02/2015  . Cannabis use disorder, mild, abuse [F12.10] 12/02/2015  . PTSD (post-traumatic stress disorder) [F43.10] 12/01/2015  . MDD (major depressive disorder), recurrent, severe, with psychosis (HCC) [F33.3] 12/01/2015   Total Time spent with patient: 25 minutes  Past Psychiatric History: Patient has had several admissions to mental health facilities [facility]. Pt has hx of suicide attempt in the past - cut wrist.  Past Medical History:  Past Medical History  Diagnosis Date  . Diabetes mellitus without complication (HCC)   . Hypertension   . PTSD (post-traumatic stress disorder)   . H/O suicide attempt     cut wrists - 19-y-o   History  reviewed. No pertinent past surgical history. Family History:  Family History  Problem Relation Age of Onset  . Alcoholism Other    Family Psychiatric  History: Patient reports that several family members on her dad's side has alcoholism. Pt denies hx of mental illness, suicide in family. Social History: Pt is single , unemployed , is currently homeless. Pt denies any pending legal charges. Pt denies having children. History  Alcohol Use  . 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 Yes  . Special: "Crack" cocaine    Comment: crack cocaine binge 1 mth ago. thc over 5 years ago.     Social History   Social History  . Marital Status: Single    Spouse Name: N/A  . Number of Children: N/A  . Years of Education: N/A   Social History Main Topics  . Smoking status: Former Games developer  . Smokeless tobacco: None  . 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 Drug Use: Yes    Special: "Crack" cocaine     Comment: crack cocaine binge 1 mth ago. thc over 5 years ago.   Marland Kitchen Sexual Activity: Not Asked   Other Topics Concern  . None   Social History Narrative   Additional Social History:   Sleep: Fair  Appetite:  Fair  Current Medications: Current Facility-Administered Medications  Medication Dose Route  Frequency Provider Last Rate Last Dose  . acetaminophen (TYLENOL) tablet 650 mg  650 mg Oral Q6H PRN Thermon Leyland, NP   650 mg at 12/07/15 0811  . alum & mag hydroxide-simeth (MAALOX/MYLANTA) 200-200-20 MG/5ML suspension 30 mL  30 mL Oral Q4H PRN Thermon Leyland, NP      . ARIPiprazole (ABILIFY) tablet 20 mg  20 mg Oral QHS Ura Hausen, MD      . benztropine (COGENTIN) tablet 0.5 mg  0.5 mg Oral QHS Jomarie Longs, MD   0.5 mg at 12/06/15 2110  . DULoxetine (CYMBALTA) DR capsule 60 mg  60 mg Oral QPM Nicoli Nardozzi, MD   60 mg at 12/06/15 1701  . DULoxetine  (CYMBALTA) DR capsule 60 mg  60 mg Oral Daily Jomarie Longs, MD   60 mg at 12/07/15 0811  . gabapentin (NEURONTIN) capsule 400 mg  400 mg Oral TID Thermon Leyland, NP   400 mg at 12/07/15 1202  . glipiZIDE (GLUCOTROL) tablet 5 mg  5 mg Oral BID AC Thermon Leyland, NP   5 mg at 12/07/15 4098  . hydrochlorothiazide (HYDRODIURIL) tablet 25 mg  25 mg Oral Daily Thermon Leyland, NP   25 mg at 12/07/15 0811  . hydrOXYzine (ATARAX/VISTARIL) tablet 25 mg  25 mg Oral Q6H PRN Beau Fanny, FNP   25 mg at 12/07/15 1191  . lisinopril (PRINIVIL,ZESTRIL) tablet 20 mg  20 mg Oral Daily Thermon Leyland, NP   Stopped at 12/06/15 [phone]  . risperiDONE (RISPERDAL M-TABS) disintegrating tablet 2 mg  2 mg Oral Q8H PRN Kerry Hough, PA-C   2 mg at 11/30/15 2056   And  . LORazepam (ATIVAN) tablet 1 mg  1 mg Oral PRN Kerry Hough, PA-C       And  . ziprasidone (GEODON) injection 20 mg  20 mg Intramuscular PRN Kerry Hough, PA-C      . magnesium hydroxide (MILK OF MAGNESIA) suspension 30 mL  30 mL Oral Daily PRN Thermon Leyland, NP   30 mL at 12/03/15 2152  . metFORMIN (GLUCOPHAGE) tablet 1,000 mg  1,000 mg Oral BID WC Jomarie Longs, MD   1,000 mg at 12/07/15 0811  . nicotine polacrilex (NICORETTE) gum 2 mg  2 mg Oral PRN Jomarie Longs, MD   2 mg at 12/06/15 1703  . traZODone (DESYREL) tablet 100 mg  100 mg Oral QHS Jomarie Longs, MD   100 mg at 12/06/15 2110    Lab Results:  Results for orders placed or performed during the hospital encounter of 11/30/15 (from the past 48 hour(s))  Glucose, capillary     Status: Abnormal   Collection Time: 12/06/15  5:47 AM  Result Value Ref Range   Glucose-Capillary 121 (H) 65 - 99 mg/dL  Glucose, capillary     Status: Abnormal   Collection Time: 12/07/15  6:16 AM  Result Value Ref Range   Glucose-Capillary 151 (H) 65 - 99 mg/dL   Comment 1 Notify RN     Physical Findings: AIMS: Facial and Oral Movements Muscles of Facial Expression: None, normal Lips and Perioral Area:  None, normal Jaw: None, normal Tongue: None, normal,Extremity Movements Upper (arms, wrists, hands, fingers): None, normal Lower (legs, knees, ankles, toes): None, normal, Trunk Movements Neck, shoulders, hips: None, normal, Overall Severity Severity of abnormal movements (highest score from questions above): None, normal Incapacitation due to abnormal movements: None, normal Patient's awareness of abnormal movements (rate only patient's report): No  Awareness, Dental Status Current problems with teeth and/or dentures?: No Does patient usually wear dentures?: No  CIWA:    COWS:     Musculoskeletal: Strength & Muscle Tone: within normal limits Gait & Station: normal Patient leans: N/A  Psychiatric Specialty Exam: Review of Systems  Psychiatric/Behavioral: Positive for depression, suicidal ideas and hallucinations. The patient is nervous/anxious.   All other systems reviewed and are negative.   Blood pressure 121/71, pulse 79, temperature 98.5 F (36.9 C), temperature source Oral, resp. rate 16, height  (1.6 m), weight 139.708 kg (308 lb), SpO2 96 %.Body mass index is 54.57 kg/(m^2).  General Appearance: Casual  Eye Contact::  Fair  Speech:  Clear and Coherent  Volume:  Decreased  Mood:  Anxious and Depressed  Affect:  Congruent  Thought Process:  Goal Directed  Orientation:  Full (Time, Place, and Person)  Thought Content:  Hallucinations: Auditory Command:  Kill self, Paranoid Ideation and Rumination  Suicidal Thoughts:  Yes.  without intent/plan has command AH ASKING HER TO KILL SELF.  Homicidal Thoughts:  No   Memory:  Immediate;   Fair Recent;   Fair Remote;   Fair  Judgement:  Impaired  Insight:  Shallow  Psychomotor Activity:  Normal  Concentration:  Poor  Recall:  [employer] of Knowledge:Fair  Language: Fair  Akathisia:  No  Handed:  Right  AIMS (if indicated):     Assets:  Others:  access to health care  ADL's:  Intact  Cognition: WNL  Sleep:  Number  of Hours: 5.75   Treatment Plan Summary:Pt is a 84 y old AAF , single , unemployed , has hx of MDD , PTSD , alcohol use disorder, cannabis abuse, cocaine abuse ( remission) , Opioid abuse ( remission ), who presented with worsening depression and AH. Pt continue to have SI, as well as has command AH asking her to kills self. Will continue to treatment. Daily contact with patient to assess and evaluate symptoms and progress in treatment and Medication management  Continue Cymbalta 120  mg po daily for affective sx.Will consider adding another antidepressant like Remeron if she continues to feel depressed. However , her depression is mostly due to her situation and being hopeless about it. Will continue Trazodone  100 mg po qhs for sleep. Will increase Abilify to 20 mg po qhs for psychosis as well as to augment the effect of Cymbalta. Will continue Cogentin 0.5 mg po qhs for EPS. Will make available PRN medications as per agitation protocol. Will continue to monitor vitals ,medication compliance and treatment side effects while patient is here.  Will monitor for medical issues as well as call consult as needed.  Reviewed labs ,TSH - wnl, PL - elevated - pt currently on Abilify which will help reduce it, Lipid panel - wnl. EKG - wnl.  CSW will start working on disposition. Pt to be referred to CBT. Recreational therapy consult. Patient to participate in therapeutic milieu.  Hailey Lyne  MD 12/07/2015, 2:15 PM

## 2015-12-07 NOTE — [facility] Group Notes (Signed)
[facility] LCSW Group Therapy  12/07/2015 3:34 PM   Type of Therapy:  Group Therapy  Participation Level:  Active  Participation Quality:  Attentive  Affect:  Appropriate  Cognitive:  Appropriate  Insight:  Improving  Engagement in Therapy:  Engaged  Modes of Intervention:  Clarification, Education, Exploration and Socialization  Summary of Progress/Problems: Today's group focused on relapse prevention.  We defined the term, and then brainstormed on ways to prevent relapse. Stayed the entire time.  Engaged throughout.  Talked about how she enjoys being here because she learns from others, especially those who are experiencing similar symptoms and challenges.  Talked about her goal of becoming reconnected with family, working again, and being stable and happy.  Hailey Klein 12/07/2015 , 3:34 PM

## 2015-12-07 NOTE — Progress Notes (Signed)
Recreation Therapy Notes  01.24.2017 LRT met with patient to follow up on stress management techniques introduced in previous 1:1 sessions. Patient reports she has been practicing techniques and has found them to be helpful. Patient additionally reported that she has been doing yoga with another patient on the unit. LRT offered to introduce progressive body scan to patient, patient declined and asked LRT to return the following day. LRT honored patient request.   Lane Hacker, LRT/CTRS   Lane Hacker 12/07/2015 2:40 PM

## 2015-12-07 NOTE — Progress Notes (Signed)
   D: Pt was pleasant and engaged appropriately during the assessment. Stated she's still positive for A/H, but stated they are not command. Pt was observed in the dayroom and attended group. Pt has no questions or concerns.   A:  Support and encouragement was offered. 15 min checks continued for safety.  R: Pt remains safe.

## 2015-12-07 NOTE — Progress Notes (Signed)
DAR Notes: Hailey Klein has been up and in the day room much of the day.  She continues to voice passive SI and is able to contract for safety.  She continues to hear voices that tell her to harm herself but she "ignores them as best as possible."  She takes her medications without difficulty.  She reports that she feels anxious most of the time.  Vistaril given prn with good relief.  She completed her self inventory and reports that her anxiety, depression and hopelessness are 6/10.  Her goal for today is "to work on depression" and she will accomplish this goal by "go to group."  Encouraged continued participation in group and unit activities.  Q 15 minute checks maintained for safety.  We will continue to monitor the progress towards her goals.

## 2015-12-07 NOTE — Progress Notes (Signed)
D:Patient in the dayroom on approach.  Patient states she had a good day but still worries where she will discharge to.  Patient states the plan is for her to discharge Thursday.  Patient states she is passive SI as long as she has no job and no place to live.  Patient contracts for safety while here at [facility].  Patient denies SI/HI and denies AVH A: Staff to monitor Q 15 mins for safety.  Encouragement and support offered.  Scheduled medications administered per orders. R: Patient remains safe on the unit.  Patient attended group tonight.  Patient visible on the unit and interacting with peers.  Patient taking administered medications.

## 2015-12-07 NOTE — [facility] Group Notes (Signed)
[facility] Group Notes:  (Nursing/MHT/Case Management/Adjunct)  Date:  12/07/2015  Time:  11:31 AM  Type of Therapy:  Nurse Education  Participation Level:  Active  Participation Quality:  Appropriate and Attentive  Affect:  Appropriate  Cognitive:  Alert and Appropriate  Insight:  Improving  Engagement in Group:  Engaged and Supportive  Modes of Intervention:  Discussion and Education  Summary of Progress/Problems:  Group topic was Recovery.  Discussed coping skills, goal setting and importance of sleep.  Maureen Ralphs was able to talk about sleep hygiene and was very supportive to other peers.  Norm Parcel Marta Bouie 12/07/2015, 11:31 AM

## 2015-12-07 NOTE — Progress Notes (Signed)
Adult Psychoeducational Group Note  Date:  12/07/2015 Time:  8:15 PM  Group Topic/Focus:  Wrap-Up Group:   The focus of this group is to help patients review their daily goal of treatment and discuss progress on daily workbooks.  Participation Level:  Active  Participation Quality:  Appropriate  Affect:  Appropriate  Cognitive:  Appropriate  Insight: Good  Engagement in Group:  Engaged  Modes of Intervention:  Discussion  Additional Comments:  Pt was pleasant during wrap-up group. Pt rated her overall day a 9 out of 10 because "just being alive and able to see another day is a blessing". Pt reported that she did not have a goal for the day. Pt noted that "staying focused" was the highlight of her day.  Cleotilde Neer 12/07/2015, 8:25 PM

## 2015-12-08 LAB — GLUCOSE, CAPILLARY: Glucose-Capillary: 106 mg/dL — ABNORMAL HIGH (ref 65–99)

## 2015-12-08 NOTE — Progress Notes (Signed)
Recreation Therapy Notes  01.25.2017 LRT met with patient to follow up on stress management technique introduced in previous 1:1 sessions. Patient readily engaged with LRT, presenting with bright affect, mood congruent. Patient engaged in progressive body scan with LRT, voiced no concerns and demonstrated ability to practice independently post d/c. Patient reports she is anticipating d/c tomorrow, expressed no concerns about d/c.   Laureen Ochs Nneka Blanda, LRT/CTRS   Lane Hacker 12/08/2015 3:44 PM

## 2015-12-08 NOTE — [facility] Group Notes (Signed)
[facility] LCSW Group Therapy  12/08/2015 1:37 PM  Type of Therapy: Group Therapy  Participation Level: Active  Participation Quality: Attentive  Affect: Flat  Cognitive: Oriented  Insight: Limited  Engagement in Therapy: Engaged  Modes of Intervention: Discussion and Socialization  Summary of Progress/Problems: Hailey Klein from [employer] was here to tell his story of recovery and play his guitar. Pt was pleasant and stayed the entire time. Pt dozed off a few times during group but otherwise seemed interested in the discussion.  Hailey Klein. Beverely Pace 12/08/2015 1:37 PM

## 2015-12-08 NOTE — Plan of Care (Signed)
Problem: Alteration in mood Goal: LTG-Pt's behavior demonstrates decreased signs of depression (Patient's behavior demonstrates decreased signs of depression to the point the patient is safe to return home and continue treatment in an outpatient setting)  Outcome: Progressing Hailey Klein appears happy and interactive with staff and peers.  She laughs and jokes with staff.  We will continue to monitor the progress towards her goals.

## 2015-12-08 NOTE — Progress Notes (Signed)
[facility] MD Progress Note  12/08/2015 3:33 PM Hailey Klein  MRN:  [id number] Subjective: Patient states "I am fine , I am not hearing a lot of AH today , they are softer. I have passive SI , I do not have a plan. I am able to cope with it.'    Objective: Hailey Klein, a 50 y.o. AA female, single,  unemployed , homeless has a hx of MDD as well as substance abuse (alcohol in remission, cocaine in remission, cannabis , oxycodone in remission ) who presented to the Emergency Department complaining of suicidal and homicidal thoughts.   Patient seen and chart reviewed.Discussed patient with treatment team.  Pt today seen as less depressed, playful , interactive in milieu. Pt reports improvement of AH and is able to cope with her SI. Patient's SI is currently due to her social situation. Discussed referring her to possible resources to help with her social situation. Per staff - pt is observed as not depressed , interactive in milieu, is compliant on medications.     Principal Problem: MDD (major depressive disorder), recurrent, severe, with psychosis (HCC) Diagnosis:   Patient Active Problem List   Diagnosis Date Noted  . Alcohol use disorder, moderate, in sustained remission (HCC) [F10.21] 12/02/2015  . Cocaine use disorder, moderate, in sustained remission [F14.90] 12/02/2015  . Opioid use disorder, moderate, in sustained remission [F11.90] 12/02/2015  . Cannabis use disorder, mild, abuse [F12.10] 12/02/2015  . PTSD (post-traumatic stress disorder) [F43.10] 12/01/2015  . MDD (major depressive disorder), recurrent, severe, with psychosis (HCC) [F33.3] 12/01/2015   Total Time spent with patient: 25 minutes  Past Psychiatric History: Patient has had several admissions to mental health facilities [facility]. Pt has hx of suicide attempt in the past - cut wrist.  Past Medical History:  Past Medical History  Diagnosis Date  . Diabetes mellitus without complication (HCC)   . Hypertension   .  PTSD (post-traumatic stress disorder)   . H/O suicide attempt     cut wrists - 19-y-o   History reviewed. No pertinent past surgical history. Family History:  Family History  Problem Relation Age of Onset  . Alcoholism Other    Family Psychiatric  History: Patient reports that several family members on her dad's side has alcoholism. Pt denies hx of mental illness, suicide in family. Social History: Pt is single , unemployed , is currently homeless. Pt denies any pending legal charges. Pt denies having children. History  Alcohol Use  . 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 Yes  . Special: "Crack" cocaine    Comment: crack cocaine binge 1 mth ago. thc over 5 years ago.     Social History   Social History  . Marital Status: Single    Spouse Name: N/A  . Number of Children: N/A  . Years of Education: N/A   Social History Main Topics  . Smoking status: Former Games developer  . Smokeless tobacco: None  . 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 Drug Use: Yes    Special: "Crack" cocaine     Comment: crack cocaine binge 1 mth ago. thc over 5 years ago.   Marland Kitchen Sexual Activity: Not Asked   Other Topics Concern  . None   Social History Narrative  Additional Social History:   Sleep: Fair  Appetite:  Fair  Current Medications: Current Facility-Administered Medications  Medication Dose Route Frequency Provider Last Rate Last Dose  . acetaminophen (TYLENOL) tablet 650 mg  650 mg Oral Q6H PRN Hailey Leyland, NP   650 mg at 12/07/15 1442  . alum & mag hydroxide-simeth (MAALOX/MYLANTA) 200-200-20 MG/5ML suspension 30 mL  30 mL Oral Q4H PRN Hailey Leyland, NP      . ARIPiprazole (ABILIFY) tablet 20 mg  20 mg Oral QHS Jomarie Longs, MD   20 mg at 12/07/15 2102  . benztropine (COGENTIN) tablet 0.5 mg  0.5 mg Oral QHS Jomarie Longs, MD   0.5 mg at 12/07/15 2102  .  DULoxetine (CYMBALTA) DR capsule 60 mg  60 mg Oral QPM Hailey Bomberger, MD   60 mg at 12/07/15 1701  . DULoxetine (CYMBALTA) DR capsule 60 mg  60 mg Oral Daily Jomarie Longs, MD   60 mg at 12/08/15 0752  . gabapentin (NEURONTIN) capsule 400 mg  400 mg Oral TID Hailey Leyland, NP   400 mg at 12/08/15 1151  . glipiZIDE (GLUCOTROL) tablet 5 mg  5 mg Oral BID AC Hailey Leyland, NP   5 mg at 12/08/15 0631  . hydrochlorothiazide (HYDRODIURIL) tablet 25 mg  25 mg Oral Daily Hailey Leyland, NP   25 mg at 12/08/15 0752  . hydrOXYzine (ATARAX/VISTARIL) tablet 25 mg  25 mg Oral Q6H PRN Hailey Fanny, FNP   25 mg at 12/08/15 0753  . lisinopril (PRINIVIL,ZESTRIL) tablet 20 mg  20 mg Oral Daily Hailey Leyland, NP   Stopped at 12/06/15 [phone]  . risperiDONE (RISPERDAL M-TABS) disintegrating tablet 2 mg  2 mg Oral Q8H PRN Hailey Hough, PA-C   2 mg at 11/30/15 2056   And  . LORazepam (ATIVAN) tablet 1 mg  1 mg Oral PRN Hailey Hough, PA-C       And  . ziprasidone (GEODON) injection 20 mg  20 mg Intramuscular PRN Hailey Hough, PA-C      . magnesium hydroxide (MILK OF MAGNESIA) suspension 30 mL  30 mL Oral Daily PRN Hailey Leyland, NP   30 mL at 12/03/15 2152  . metFORMIN (GLUCOPHAGE) tablet 1,000 mg  1,000 mg Oral BID WC Jomarie Longs, MD   1,000 mg at 12/08/15 0752  . nicotine polacrilex (NICORETTE) gum 2 mg  2 mg Oral PRN Jomarie Longs, MD   2 mg at 12/08/15 1049  . traZODone (DESYREL) tablet 100 mg  100 mg Oral QHS Jomarie Longs, MD   100 mg at 12/07/15 2102    Lab Results:  Results for orders placed or performed during the hospital encounter of 11/30/15 (from the past 48 hour(s))  Glucose, capillary     Status: Abnormal   Collection Time: 12/07/15  6:16 AM  Result Value Ref Range   Glucose-Capillary 151 (H) 65 - 99 mg/dL   Comment 1 Notify RN   Glucose, capillary     Status: Abnormal   Collection Time: 12/08/15  6:19 AM  Result Value Ref Range   Glucose-Capillary 106 (H) 65 - 99 mg/dL     Physical Findings: AIMS: Facial and Oral Movements Muscles of Facial Expression: None, normal Lips and Perioral Area: None, normal Jaw: None, normal Tongue: None, normal,Extremity Movements Upper (arms, wrists, hands, fingers): None, normal Lower (legs, knees, ankles, toes): None, normal, Trunk Movements Neck, shoulders, hips: None, normal, Overall Severity Severity of abnormal movements (  highest score from questions above): None, normal Incapacitation due to abnormal movements: None, normal Patient's awareness of abnormal movements (rate only patient's report): No Awareness, Dental Status Current problems with teeth and/or dentures?: No Does patient usually wear dentures?: No  CIWA:    COWS:     Musculoskeletal: Strength & Muscle Tone: within normal limits Gait & Station: normal Patient leans: N/A  Psychiatric Specialty Exam: Review of Systems  Psychiatric/Behavioral: Positive for depression, suicidal ideas and hallucinations. The patient is nervous/anxious.   All other systems reviewed and are negative.   Blood pressure 97/68, pulse 86, temperature 98.2 F (36.8 C), temperature source Oral, resp. rate 18, height  (1.6 m), weight 139.708 kg (308 lb), SpO2 96 %.Body mass index is 54.57 kg/(m^2).  General Appearance: Casual  Eye Contact::  Fair  Speech:  Clear and Coherent  Volume:  Normal  Mood:  Anxious and Depressedimproved  Affect:  Congruent  Thought Process:  Goal Directed  Orientation:  Full (Time, Place, and Person)  Thought Content:  Hallucinations: Auditory Command:  Kill self, Paranoid Ideation and Ruminationreports AH as softer   Suicidal Thoughts:  Yes.  without intent/plan   Homicidal Thoughts:  No   Memory:  Immediate;   Fair Recent;   Fair Remote;   Fair  Judgement:  Fair  Insight:  Fair  Psychomotor Activity:  Normal  Concentration:  Fair  Recall:  [employer] of Knowledge:Fair  Language: Fair  Akathisia:  No  Handed:  Right  AIMS (if  indicated):     Assets:  Others:  access to health care  ADL's:  Intact  Cognition: WNL  Sleep:  Number of Hours: 6.25   Treatment Plan Summary:Pt is a 89 y old AAF , single , unemployed , has hx of MDD , PTSD , alcohol use disorder, cannabis abuse, cocaine abuse ( remission) , Opioid abuse ( remission ), who presented with worsening depression and AH. Pt continue to have SI, but is able to better cope with those thoughts. Pt reports AH as improved .Will continue to treatment. Daily contact with patient to assess and evaluate symptoms and progress in treatment and Medication management  Continue Cymbalta 120  mg po daily for affective sx.Will consider adding another antidepressant like Remeron if she continues to feel depressed. However , her depression is mostly due to her situation and being hopeless about it. Will continue Trazodone  100 mg po qhs for sleep. Will continue Abilify  20 mg po qhs for psychosis as well as to augment the effect of Cymbalta. Will continue Cogentin 0.5 mg po qhs for EPS. Will make available PRN medications as per agitation protocol. Will continue to monitor vitals ,medication compliance and treatment side effects while patient is here.  Will monitor for medical issues as well as call consult as needed.  Reviewed labs ,TSH - wnl, PL - elevated - pt currently on Abilify which will help reduce it, Lipid panel - wnl. EKG - wnl.  CSW will start working on disposition. Pt to be referred to CBT. Recreational therapy consult. Patient to participate in therapeutic milieu.  Azaria Bartell  MD 12/08/2015, 3:33 PM

## 2015-12-08 NOTE — Progress Notes (Signed)
D:Patient in the dayroom on approach.  Patient states she had a good day but states she was sad.  Patient states she has been worrying about where she will go after discharge.  Patient states she thinks she needs to stay one more night.  Patient states she has a Special educational needs teacher but states the contact person will not answer the phone.  Patient states she remains having passive SI and auditory hallucinations to kill herself but no plan.  Patient denies AVH.  Patient does brighten when talking to staff but appears worried and sad when sitting alone. A: Staff to monitor Q 15 mins for safety.  Encouragement and support offered.  Scheduled medications administered per orders. R: Patient remains safe on the unit.  Patient attended group tonight.  Patient visible on the unit and interacting with peers.  Patient taking administered medications.

## 2015-12-08 NOTE — Progress Notes (Signed)
Adult Psychoeducational Group Note  Date:  12/08/2015 Time:  8:15 PM  Group Topic/Focus:  Wrap-Up Group:   The focus of this group is to help patients review their daily goal of treatment and discuss progress on daily workbooks.  Participation Level:  Active  Participation Quality:  Appropriate  Affect:  Appropriate  Cognitive:  Appropriate  Insight: Appropriate  Engagement in Group:  Engaged  Modes of Intervention:  Discussion  Additional Comments:  Pt was pleasant during wrap-up group and stated that her day was good and rated her overall day an 8 out of 10. Pt reported that she did not have a goal for the day, and noted that the highlight of her day was the good food that she ate today.   Cleotilde Neer 12/08/2015, 8:40 PM

## 2015-12-08 NOTE — Progress Notes (Signed)
Hailey Klein has been visible on the unit.  She has been attending groups and interacting with peers.  She is very playful and joking with the staff members.  She smiles and asks staff how there days are going.  She continues to voice that she is having passive SI but is able to contract for safety.  She admits to still hearing voices that are telling her to hurt herself.  She completed her self inventory and reported that her depression, hopelessness and anxiety are 6/10.  She states that her goal for today is "focusing/concentration" and she will accomplish this goal by "attending groups."  She denies any pain or discomfort and appears to be in no physical distress.  PRN for anxiety given with good relief.  Encouraged continued participation in group and unit activities.  Q 15 minute checks maintained for safety.  We will continue to monitor the progress towards her goals.

## 2015-12-09 LAB — GLUCOSE, CAPILLARY: GLUCOSE-CAPILLARY: 89 mg/dL (ref 65–99)

## 2015-12-09 MED ORDER — MENTHOL 3 MG MT LOZG: 1.0000 | LOZENGE | OROMUCOSAL | Status: DC | PRN

## 2015-12-09 MED ORDER — BENZTROPINE MESYLATE 0.5 MG PO TABS: 0.5000 mg | ORAL_TABLET | Freq: Every day | ORAL | Status: DC

## 2015-12-09 MED ORDER — HYDROCHLOROTHIAZIDE 25 MG PO TABS: 25.0000 mg | ORAL_TABLET | Freq: Every day | ORAL | Status: DC

## 2015-12-09 MED ORDER — ACETAMINOPHEN 325 MG PO TABS: 650.0000 mg | ORAL_TABLET | Freq: Four times a day (QID) | ORAL | Status: DC | PRN

## 2015-12-09 MED ORDER — METFORMIN HCL 1000 MG PO TABS: 1000.0000 mg | ORAL_TABLET | Freq: Two times a day (BID) | ORAL | Status: DC

## 2015-12-09 MED ORDER — LISINOPRIL 20 MG PO TABS: 20.0000 mg | ORAL_TABLET | Freq: Every day | ORAL | Status: DC

## 2015-12-09 MED ORDER — NICOTINE POLACRILEX 2 MG MT GUM: 2.0000 mg | CHEWING_GUM | OROMUCOSAL | Status: DC | PRN

## 2015-12-09 MED ORDER — ARIPIPRAZOLE 20 MG PO TABS
20.0000 mg | ORAL_TABLET | Freq: Every day | ORAL | Status: DC
Start: 2015-12-09 — End: 2023-02-03

## 2015-12-09 MED ORDER — TRAZODONE HCL 100 MG PO TABS: 100.0000 mg | ORAL_TABLET | Freq: Every day | ORAL | Status: DC

## 2015-12-09 MED ORDER — HYDROXYZINE HCL 25 MG PO TABS: 25.0000 mg | ORAL_TABLET | Freq: Four times a day (QID) | ORAL | Status: DC | PRN

## 2015-12-09 MED ORDER — DULOXETINE HCL 60 MG PO CPEP: ORAL_CAPSULE | ORAL | Status: DC

## 2015-12-09 MED ORDER — GLIPIZIDE 5 MG PO TABS: 5.0000 mg | ORAL_TABLET | Freq: Two times a day (BID) | ORAL | Status: DC

## 2015-12-09 MED ORDER — GABAPENTIN 400 MG PO CAPS: 400.0000 mg | ORAL_CAPSULE | Freq: Three times a day (TID) | ORAL | Status: DC
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
## 2015-12-09 NOTE — Progress Notes (Signed)
  [facility] Adult Case Management Discharge Plan :  Will you be returning to the same living situation after discharge:  No. At discharge, do you have transportation home?: Yes,  bus pass and ride from [place] Do you have the ability to pay for your medications: Yes,  mental health  Release of information consent forms completed and in the chart;  Patient's signature needed at discharge.  Patient to Follow up at: Follow-up Information    Go to [facility].   Why:  Walk-in appointment Mon-Fri -5p arrive as early as possible    Contact information:   [phone] [address] [place], [place]        Follow up with [facility].   Specialty:  Cardiology   Why:  please call and schedule an appointment for further evaluation and stress test   Contact information:   [address], [address] [place] [address] [phone]      Go to [place].   Why:  Walk-in apt Mon-Thur 8am-3p or Friday 8-1p. Arrive as early as possible to be sure that you will be able to be seen.   Contact information:   [address]  [place], [place] [phone]      Next level of care provider has access to [facility] Link:no  Safety Planning and Suicide Prevention discussed: Yes,  yes  Have you used any form of tobacco in the last 30 days? (Cigarettes, Smokeless Tobacco, Cigars, and/or Pipes): Yes  Has patient been referred to the Quitline?: No  Has no phone number not no address at which she can be reached.  Patient has been referred for addiction treatment: N/A  Ida Rogue 12/09/2015, 10:06 AM

## 2015-12-09 NOTE — Progress Notes (Signed)
D:Per patient self inventory form pt reports she slept good last night with the use of sleep medication. Pt reports a good appetite, normal energy level, good concentration. Pt rates depression 6/10, hopelessness 6/10, anxiety 6/10- all on 0-10 scale, 10 being the worse. Pt reports her goal is "getting better" and she will "attend groups" to meet her goal. Pt denies AVH. Denies HI. Reports passive SI, but verbally contracts for safety. Denies plan to harm self. Reports she feels safe talking to staff.  A:Special checks q 15 mins in place for safety. Medication administered per MD order(See eMAR) Encouragement and support provided.  R:Safety mantained. Verbally contracts for safety. Will continue to monitor.

## 2015-12-09 NOTE — Progress Notes (Signed)
Discharge note: Pt discharged per MD order. Pt denies SI/HI. Denies AVH at discharge. Reports she was scared to be discharged earlier because she had no place to go,but is happy now because she has a place to go. Discharge summary reviewed with pt. Pt verbalizes understanding of discharge summary and follow up appointments. Pt provided with sample medication and bus pass. All personal belongings returned to pt from locker # 48, pt verbalizes and signs she received all personal items. Pt denies pain. Ambulatory out of facility.
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
## 2015-12-09 NOTE — [facility] Suicide Risk Assessment (Signed)
[facility] Discharge Suicide Risk Assessment   Principal Problem: MDD (major depressive disorder), recurrent, severe, with psychosis (HCC) Discharge Diagnoses:  Patient Active Problem List   Diagnosis Date Noted  . Alcohol use disorder, moderate, in sustained remission (HCC) [F10.21] 12/02/2015  . Cocaine use disorder, moderate, in sustained remission [F14.90] 12/02/2015  . Opioid use disorder, moderate, in sustained remission [F11.90] 12/02/2015  . Cannabis use disorder, mild, abuse [F12.10] 12/02/2015  . PTSD (post-traumatic stress disorder) [F43.10] 12/01/2015  . MDD (major depressive disorder), recurrent, severe, with psychosis (HCC) [F33.3] 12/01/2015    Total Time spent with patient: 30 minutes  Musculoskeletal: Strength & Muscle Tone: within normal limits Gait & Station: normal Patient leans: N/A  Psychiatric Specialty Exam: Review of Systems  Psychiatric/Behavioral: The patient is nervous/anxious.   All other systems reviewed and are negative.   Blood pressure 104/65, pulse 88, temperature 98 F (36.7 C), temperature source Oral, resp. rate 16, height  (1.6 m), weight 139.708 kg (308 lb), SpO2 96 %.Body mass index is 54.57 kg/(m^2).  General Appearance: Casual  Eye Contact::  Fair  Speech:  Clear and Coherent409  Volume:  Normal  Mood:  Anxious  Affect:  Appropriate  Thought Process:  Coherent  Orientation:  Full (Time, Place, and Person)  Thought Content:  WDL  Suicidal Thoughts:  No  Homicidal Thoughts:  No  Memory:  Immediate;   Fair Recent;   Fair Remote;   Fair  Judgement:  Fair  Insight:  Fair  Psychomotor Activity:  Normal  Concentration:  Fair  Recall:  [employer] of Knowledge:Fair  Language: Fair  Akathisia:  No  Handed:  Right  AIMS (if indicated):     Assets:  Desire for Improvement  Sleep:  Number of Hours: 5.5  Cognition: WNL  ADL's:  Intact   Mental Status Per Nursing Assessment::   On Admission:     Demographic Factors:   Unemployed  Loss Factors: Financial problems/change in socioeconomic status  Historical Factors: Impulsivity  Risk Reduction Factors:   Positive social support  Continued Clinical Symptoms:  Alcohol/Substance Abuse/Dependencies Previous Psychiatric Diagnoses and Treatments  Cognitive Features That Contribute To Risk:  Polarized thinking    Suicide Risk:  Minimal: No identifiable suicidal ideation.  Patients presenting with no risk factors but with morbid ruminations; may be classified as minimal risk based on the severity of the depressive symptoms  Follow-up Information    Go to [facility].   Why:  Walk-in appointment Mon-Fri -5p arrive as early as possible    Contact information:   [phone] [address] [place], [place]        Follow up with [facility] [employer].   Specialty:  Cardiology   Why:  please call and schedule an appointment for further evaluation and stress test   Contact information:   [address] [place] [address] [phone]      Plan Of Care/Follow-up recommendations:  Activity:  no restrictions Diet:  regular Tests:  as needed Other:  follow up with aftercare  Irelynn Schermerhorn, MD 12/09/2015, 9:39 AM

## 2017-04-30 DIAGNOSIS — I214 Non-ST elevation (NSTEMI) myocardial infarction: Secondary | ICD-10-CM | POA: Insufficient documentation

## 2017-07-13 DIAGNOSIS — I2510 Atherosclerotic heart disease of native coronary artery without angina pectoris: Secondary | ICD-10-CM | POA: Insufficient documentation

## 2017-12-08 IMAGING — CR DG CHEST 2V
2 series · 2 of 2 positions shown · non-contrast
Comparison: Chest radiograph July 22, 2015

CLINICAL DATA: LEFT chest pain tonight. History of hypertension and
diabetes.

EXAM:
CHEST  2 VIEW

[chest pa]
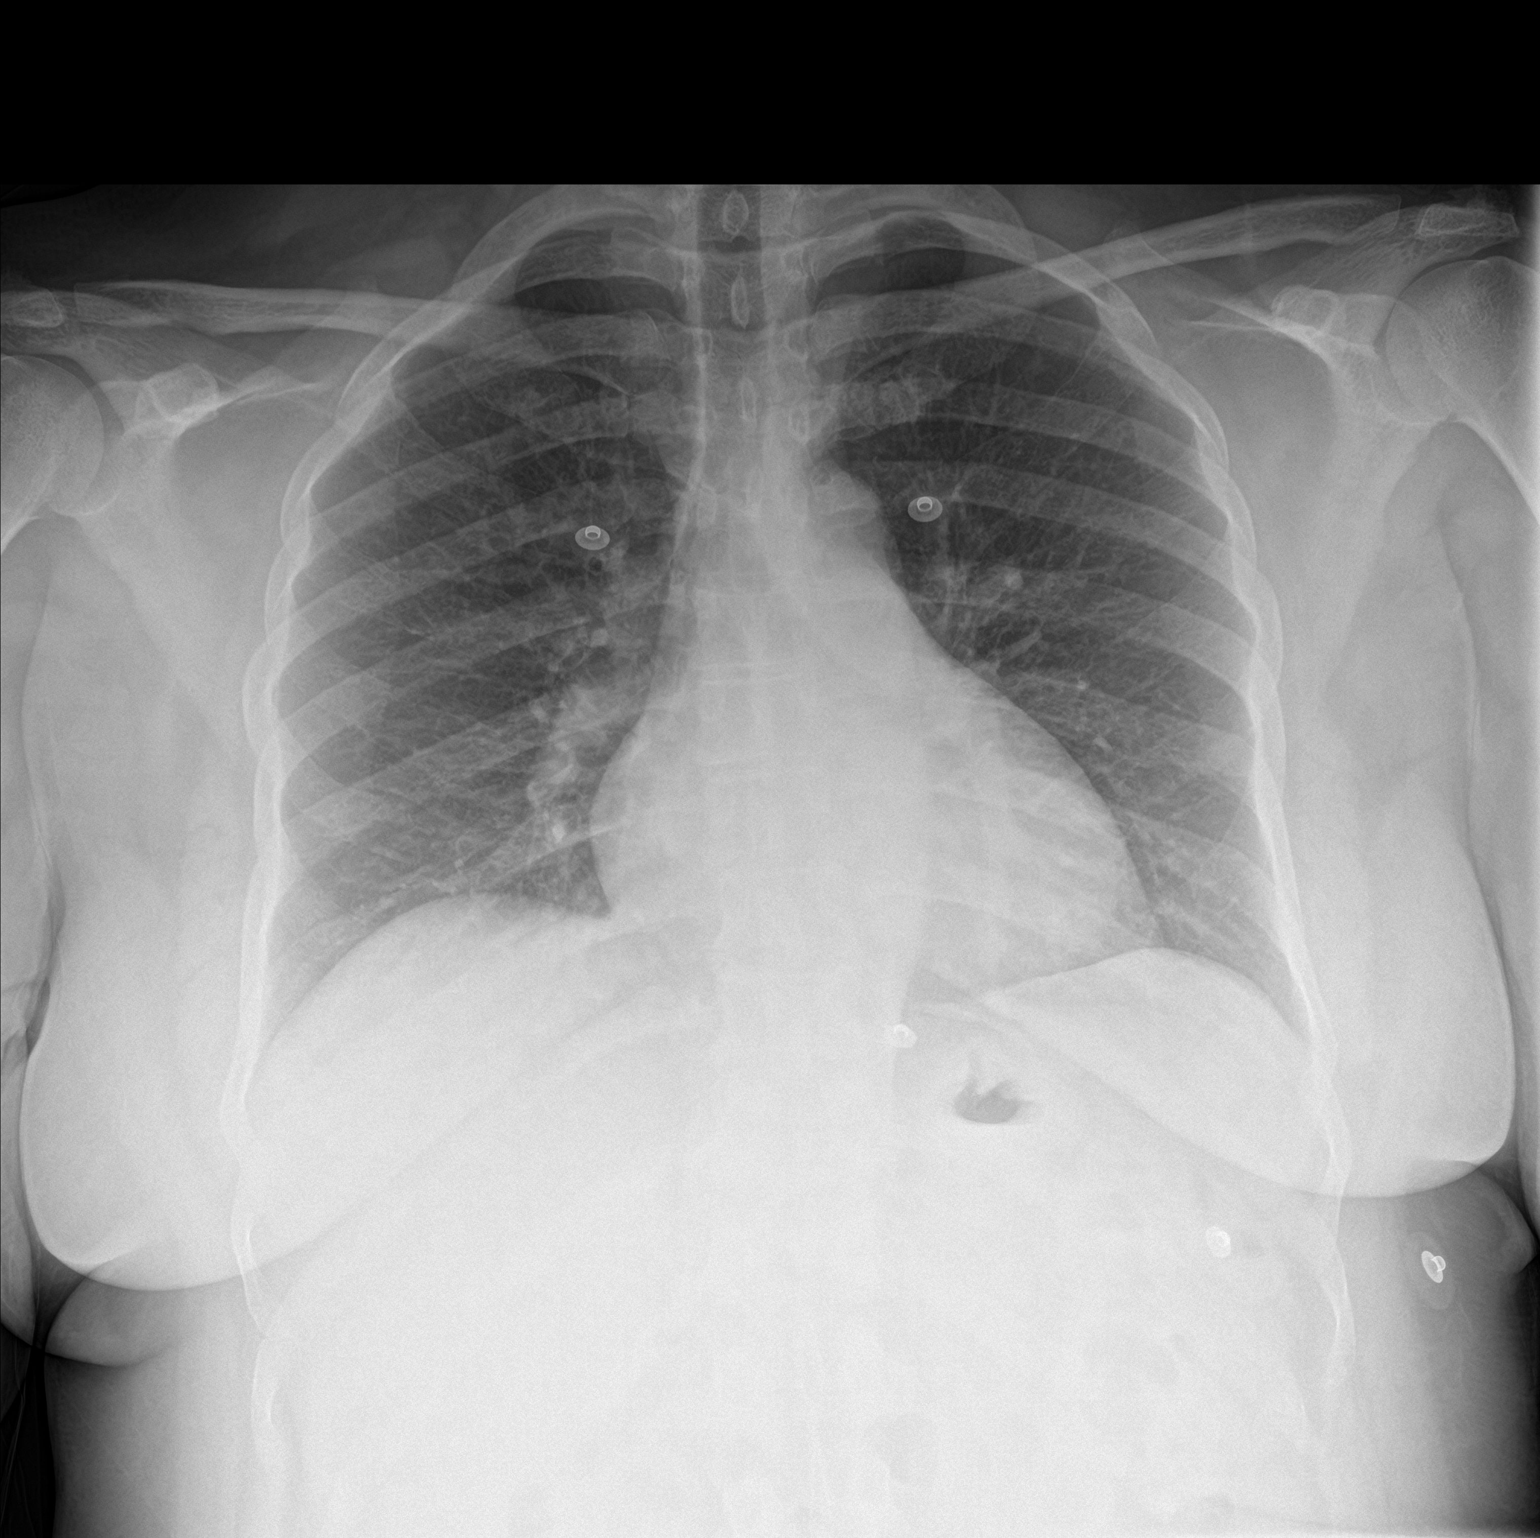

[chest lat]
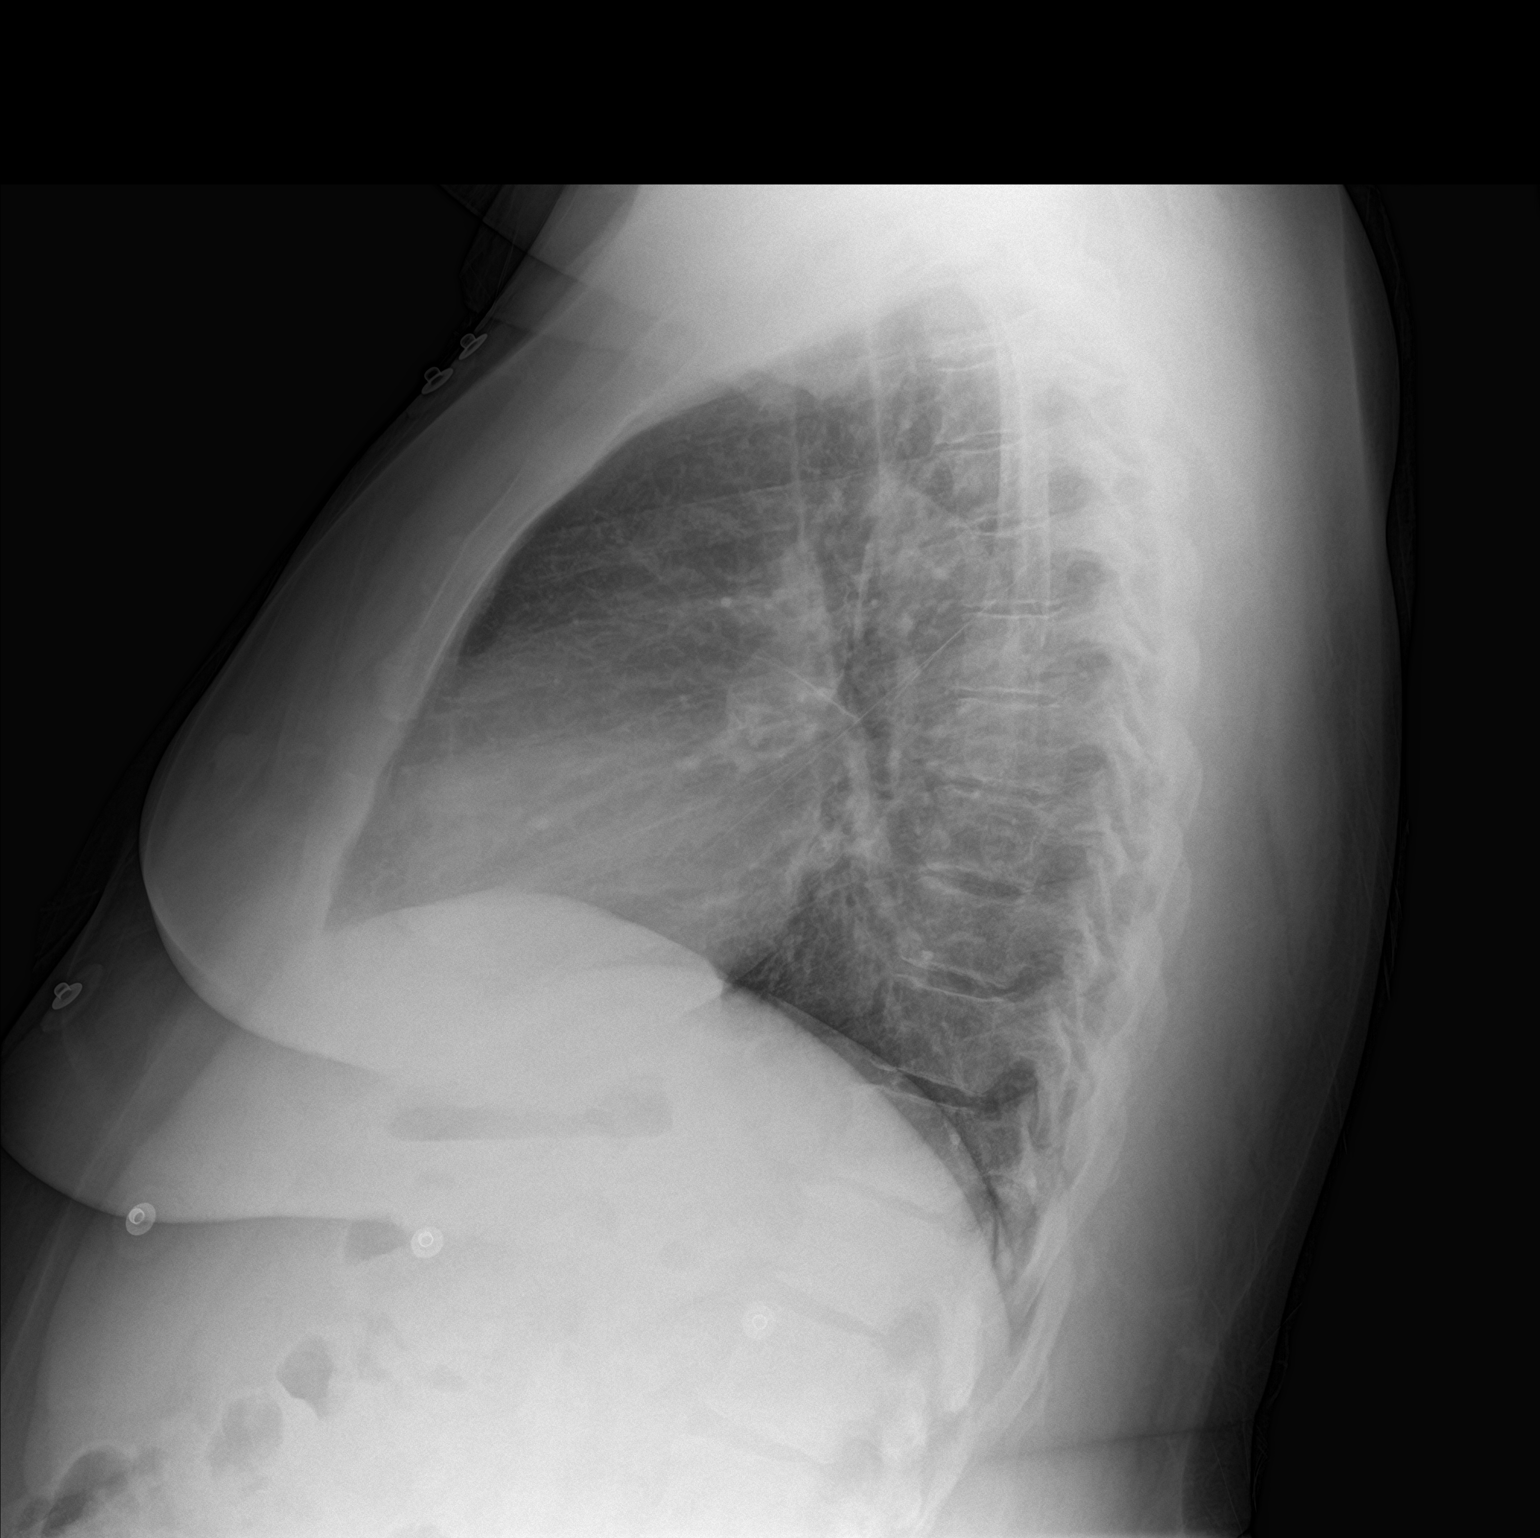

[2 of 2 positions shown; findings below may reference images not displayed]

FINDINGS: Cardiomediastinal silhouette is normal. The lungs are clear without
pleural effusions or focal consolidations. Trachea projects midline
and there is no pneumothorax. Soft tissue planes and included
osseous structures are non-suspicious.
IMPRESSION: Normal chest.

## 2018-01-08 DIAGNOSIS — Z720 Tobacco use: Secondary | ICD-10-CM | POA: Diagnosis present

## 2018-09-10 DIAGNOSIS — E1142 Type 2 diabetes mellitus with diabetic polyneuropathy: Secondary | ICD-10-CM | POA: Diagnosis present

## 2019-09-10 DIAGNOSIS — I824Y9 Acute embolism and thrombosis of unspecified deep veins of unspecified proximal lower extremity: Secondary | ICD-10-CM | POA: Insufficient documentation

## 2020-03-22 DIAGNOSIS — J984 Other disorders of lung: Secondary | ICD-10-CM

## 2020-03-22 DIAGNOSIS — E669 Obesity, unspecified: Secondary | ICD-10-CM

## 2022-05-24 ENCOUNTER — Encounter (HOSPITAL_COMMUNITY): Payer: Self-pay | Admitting: Urgent Care

## 2022-05-24 ENCOUNTER — Ambulatory Visit (HOSPITAL_COMMUNITY)
Admission: EM | Admit: 2022-05-24 | Discharge: 2022-05-24 | Disposition: A | Discharge status: Home / Self Care | Payer: Medicaid Other

## 2022-05-24 ENCOUNTER — Ambulatory Visit (INDEPENDENT_AMBULATORY_CARE_PROVIDER_SITE_OTHER): Payer: Medicaid Other

## 2022-05-24 DIAGNOSIS — M25561 Pain in right knee: Secondary | ICD-10-CM | POA: Diagnosis not present

## 2022-05-24 MED ORDER — INDOMETHACIN 25 MG PO CAPS: 25.0000 mg | ORAL_CAPSULE | Freq: Three times a day (TID) | ORAL | 0 refills | Status: DC | PRN

## 2022-05-24 NOTE — Discharge Instructions (Addendum)
I suspect you have gout, or pseudogout. This is typically treated with prednisone, but given your elevated blood sugars, we have to avoid this medication. We also typically use colchicine, but this apparently is contraindicated with your carvedilol, vital blood pressure medication for you. We will then proceed with indomethacin, and anti-inflammatory medication.  Take this up to 3 times daily with food as needed.  Please follow-up with your PCP for further evaluation.

## 2022-05-24 NOTE — ED Provider Notes (Signed)
[facility]    CSN: [id number] Arrival date & time: 05/24/22  1856      History   Chief Complaint Chief Complaint  Patient presents with   Leg Pain    HPI Hailey Klein is a 56 y.o. female.   Pleasant 56 year old female presents today with her friend due to concerns of knee pain.  She states it is primarily on the right, but affects both.  Starting on Monday, she feels that her knees are swollen.  It hurts to move them.  She denies any acute injury or trauma.  She states they feel warm and swollen.  The pain is primarily in the anterior aspect.  She denies any radiation down her leg or in her foot or ankle.   Knee Pain Location:  Knee Time since incident:  2 days Injury: no   Knee location:  R knee Pain details:    Quality:  Pressure   Radiates to:  Does not radiate   Severity:  Moderate   Onset quality:  Gradual   Duration:  2 days   Timing:  Constant   Progression:  Worsening Chronicity:  New Dislocation: no   Foreign body present:  No foreign bodies Prior injury to area:  No Relieved by:  Nothing Worsened by:  Extension and bearing weight Associated symptoms: swelling     Past Medical History:  Diagnosis Date   Diabetes mellitus without complication (HCC)    H/O suicide attempt    cut wrists - 19-y-o   Hypertension    PTSD (post-traumatic stress disorder)     Patient Active Problem List   Diagnosis Date Noted   Alcohol use disorder, moderate, in sustained remission (HCC) 12/02/2015   Cocaine use disorder, moderate, in sustained remission (HCC) 12/02/2015   Opioid use disorder, moderate, in sustained remission (HCC) 12/02/2015   Cannabis use disorder, mild, abuse 12/02/2015   PTSD (post-traumatic stress disorder) 12/01/2015   MDD (major depressive disorder), recurrent, severe, 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 No pertinent surgical history.  OB History   No obstetric history on file.      Home Medications     Prior to Admission medications   Medication Sig Start Date End Date Taking? Authorizing Provider  amLODipine (NORVASC) 10 MG tablet Take 10 mg by mouth daily.   Yes [provider]  carvedilol (COREG) 12.5 MG tablet Take 12.5 mg by mouth 2 (two) times daily with a meal.   Yes [provider]  indomethacin (INDOCIN) 25 MG capsule Take 1 capsule (25 mg total) by mouth 3 (three) times daily as needed. TAKE WITH FOOD 05/24/22  Yes Yumalay Circle L, PA  losartan (COZAAR) 25 MG tablet Take 25 mg by mouth daily.   Yes [provider]  pregabalin (LYRICA) 100 MG capsule Take 100 mg by mouth 2 (two) times daily.   Yes [provider]  torsemide (DEMADEX) 20 MG tablet Take 40 mg by mouth daily.   Yes [provider]  acetaminophen (TYLENOL) 325 MG tablet Take 2 tablets (650 mg total) by mouth every 6 (six) hours as needed for mild pain. 12/09/15   Armandina Stammer I, NP  ARIPiprazole (ABILIFY) 20 MG tablet Take 1 tablet (20 mg total) by mouth at bedtime. For mood control 12/09/15   Armandina Stammer I, NP  hydrOXYzine (ATARAX/VISTARIL) 25 MG tablet Take 1 tablet (25 mg total) by mouth every 6 (six) hours as needed for anxiety. 12/09/15   Sanjuana Kava,  NP  metFORMIN (GLUCOPHAGE) 1000 MG tablet Take 1 tablet (1,000 mg total) by mouth 2 (two) times daily with a meal. For diabetes management 12/09/15   Armandina Stammer I, NP  traZODone (DESYREL) 100 MG tablet Take 1 tablet (100 mg total) by mouth at bedtime. For insomnia 12/09/15   Armandina Stammer I, NP    Family History Family History  Problem Relation Age of Onset   Alcoholism Other     Social History Social History   Tobacco Use   Smoking status: Former  Substance Use Topics   Alcohol use: Yes    Comment: etoh daily- 24 pack a day and 1/5 of gin daily. sober for 38 days   Drug use: Yes    Types: "Crack" cocaine    Comment: crack cocaine binge 1 mth ago. thc over 5 years ago.      Allergies   Aspirin   Review  of Systems Review of Systems As per HPI  Physical Exam Triage Vital Signs ED Triage Vitals  Enc Vitals Group     BP 05/24/22 1916 (!) 176/100     Pulse Rate 05/24/22 1916 77     Resp 05/24/22 1916 16     Temp 05/24/22 1916 99 F (37.2 C)     Temp Source 05/24/22 1916 Oral     SpO2 05/24/22 1916 94 %     Weight --      Height --      Head Circumference --      Peak Flow --      Pain Score 05/24/22 1912 10     Pain Loc --      Pain Edu? --      Excl. in GC? --    No data found.  Updated Vital Signs BP (!) 176/100 (BP Location: Left Arm)   Pulse 77   Temp 99 F (37.2 C) (Oral)   Resp 16   SpO2 94%   Visual Acuity Right Eye Distance:   Left Eye Distance:   Bilateral Distance:    Right Eye Near:   Left Eye Near:    Bilateral Near:     Physical Exam Vitals and nursing note reviewed. Exam conducted with a chaperone present.  Constitutional:      General: She is not in acute distress.    Appearance: Normal appearance. She is obese. She is not ill-appearing, toxic-appearing or diaphoretic.  HENT:     Head: Normocephalic and atraumatic.  Musculoskeletal:        General: Tenderness (to anterior knee) present. No deformity or signs of injury. Normal range of motion.     Comments: Due to body habitus, definitive swelling is difficult to ascertain Mild warmth to R knee, minimal erythema FROM but pt states pain with full extension Pulses intact B No crepitus MCL, LCL, ACL, PCL appear stable.  Skin:    General: Skin is warm and dry.     Findings: No erythema or rash.  Neurological:     General: No focal deficit present.     Mental Status: She is alert and oriented to person, place, and time.     Sensory: No sensory deficit.      UC Treatments / Results  Labs (all labs ordered are listed, but only abnormal results are displayed) Labs Reviewed - No data to display  EKG   Radiology DG Knee AP/LAT W/Sunrise Right  Result Date: 05/24/2022 CLINICAL DATA:   Knee pain and swelling EXAM: RIGHT KNEE 3 VIEWS  COMPARISON:  None Available. FINDINGS: No fracture or malalignment. No sizable knee effusion. Mild to moderate medial joint space degenerative change. Generalized soft tissue edema. IMPRESSION: Mild arthritis.  No acute osseous abnormality Electronically Signed   By: Jasmine Pang M.D.   On: 05/24/2022 20:20    Procedures Procedures (including critical care time)  Medications Ordered in UC Medications - No data to display  Initial Impression / Assessment and Plan / UC Course  I have reviewed the triage vital signs and the nursing notes.  Pertinent labs & imaging results that were available during my care of the patient were reviewed by me and considered in my medical decision making (see chart for details).     Acute pain of R knee - clinically suspect gout or pseudogout.  Patient's risk factors include uncontrolled diabetes, diuretic use, history of alcohol intake, CKD.  Last GFR was 50.  Considered treatment with prednisone, but patient's blood sugars have been over 350 therefore this is contraindicated.  Colchicine discussed, but is contraindicated with her carvedilol.  We will do trial of indomethacin for short-term burst.  Patient may also try over-the-counter Voltaren gel.  Recommended follow-up with her PCP in the next week should symptoms persist.   Final Clinical Impressions(s) / UC Diagnoses   Final diagnoses:  Acute pain of right knee     Discharge Instructions      I suspect you have gout, or pseudogout. This is typically treated with prednisone, but given your elevated blood sugars, we have to avoid this medication. We also typically use colchicine, but this apparently is contraindicated with your carvedilol, vital blood pressure medication for you. We will then proceed with indomethacin, and anti-inflammatory medication.  Take this up to 3 times daily with food as needed.  Please follow-up with your PCP for further  evaluation.   ED Prescriptions     Medication Sig Dispense Auth. Provider   indomethacin (INDOCIN) 25 MG capsule Take 1 capsule (25 mg total) by mouth 3 (three) times daily as needed. TAKE WITH FOOD 21 capsule Aseel Truxillo L, PA      PDMP not reviewed this encounter.   Maretta Bees, [place] 05/24/22 2133

## 2022-05-24 NOTE — ED Triage Notes (Signed)
Pt presents with c/o bilateral leg pain, swelling and numbness that began Monday.

## 2022-06-19 ENCOUNTER — Encounter (HOSPITAL_COMMUNITY): Payer: Self-pay | Admitting: *Deleted

## 2022-06-19 ENCOUNTER — Other Ambulatory Visit (HOSPITAL_COMMUNITY): Payer: Self-pay

## 2022-06-19 ENCOUNTER — Inpatient Hospital Stay (HOSPITAL_COMMUNITY)
Admission: EM | Admit: 2022-06-19 | Discharge: 2022-06-24 | DRG: 291 | Disposition: A | Discharge status: Home / Self Care | Payer: Medicaid Other | Attending: Internal Medicine | Admitting: Internal Medicine

## 2022-06-19 ENCOUNTER — Emergency Department (HOSPITAL_COMMUNITY): Payer: Medicaid Other

## 2022-06-19 ENCOUNTER — Ambulatory Visit (HOSPITAL_COMMUNITY)
Admission: EM | Admit: 2022-06-19 | Discharge: 2022-06-19 | Disposition: A | Discharge status: Home / Self Care | Payer: Medicaid Other | Attending: Physician Assistant | Admitting: Physician Assistant

## 2022-06-19 ENCOUNTER — Other Ambulatory Visit: Payer: Self-pay

## 2022-06-19 DIAGNOSIS — I509 Heart failure, unspecified: Secondary | ICD-10-CM

## 2022-06-19 DIAGNOSIS — N179 Acute kidney failure, unspecified: Secondary | ICD-10-CM | POA: Diagnosis present

## 2022-06-19 DIAGNOSIS — G5620 Lesion of ulnar nerve, unspecified upper limb: Secondary | ICD-10-CM | POA: Diagnosis not present

## 2022-06-19 DIAGNOSIS — E871 Hypo-osmolality and hyponatremia: Secondary | ICD-10-CM | POA: Diagnosis present

## 2022-06-19 DIAGNOSIS — Z79899 Other long term (current) drug therapy: Secondary | ICD-10-CM

## 2022-06-19 DIAGNOSIS — I10 Essential (primary) hypertension: Secondary | ICD-10-CM | POA: Diagnosis present

## 2022-06-19 DIAGNOSIS — M109 Gout, unspecified: Secondary | ICD-10-CM | POA: Diagnosis present

## 2022-06-19 DIAGNOSIS — R600 Localized edema: Secondary | ICD-10-CM | POA: Diagnosis not present

## 2022-06-19 DIAGNOSIS — E8809 Other disorders of plasma-protein metabolism, not elsewhere classified: Secondary | ICD-10-CM | POA: Diagnosis present

## 2022-06-19 DIAGNOSIS — Z86718 Personal history of other venous thrombosis and embolism: Secondary | ICD-10-CM

## 2022-06-19 DIAGNOSIS — I452 Bifascicular block: Secondary | ICD-10-CM | POA: Diagnosis present

## 2022-06-19 DIAGNOSIS — I130 Hypertensive heart and chronic kidney disease with heart failure and stage 1 through stage 4 chronic kidney disease, or unspecified chronic kidney disease: Principal | ICD-10-CM | POA: Diagnosis present

## 2022-06-19 DIAGNOSIS — E1165 Type 2 diabetes mellitus with hyperglycemia: Secondary | ICD-10-CM | POA: Diagnosis present

## 2022-06-19 DIAGNOSIS — Z7901 Long term (current) use of anticoagulants: Secondary | ICD-10-CM

## 2022-06-19 DIAGNOSIS — E876 Hypokalemia: Secondary | ICD-10-CM | POA: Diagnosis present

## 2022-06-19 DIAGNOSIS — R339 Retention of urine, unspecified: Secondary | ICD-10-CM | POA: Diagnosis present

## 2022-06-19 DIAGNOSIS — D631 Anemia in chronic kidney disease: Secondary | ICD-10-CM | POA: Diagnosis present

## 2022-06-19 DIAGNOSIS — I823 Embolism and thrombosis of renal vein: Secondary | ICD-10-CM | POA: Diagnosis present

## 2022-06-19 DIAGNOSIS — E1169 Type 2 diabetes mellitus with other specified complication: Secondary | ICD-10-CM | POA: Diagnosis present

## 2022-06-19 DIAGNOSIS — E66813 Obesity, class 3: Secondary | ICD-10-CM | POA: Diagnosis present

## 2022-06-19 DIAGNOSIS — N1831 Chronic kidney disease, stage 3a: Secondary | ICD-10-CM | POA: Diagnosis present

## 2022-06-19 DIAGNOSIS — F4310 Post-traumatic stress disorder, unspecified: Secondary | ICD-10-CM | POA: Diagnosis present

## 2022-06-19 DIAGNOSIS — Z6841 Body Mass Index (BMI) 40.0 and over, adult: Secondary | ICD-10-CM

## 2022-06-19 DIAGNOSIS — Z7984 Long term (current) use of oral hypoglycemic drugs: Secondary | ICD-10-CM

## 2022-06-19 DIAGNOSIS — E785 Hyperlipidemia, unspecified: Secondary | ICD-10-CM | POA: Diagnosis present

## 2022-06-19 DIAGNOSIS — E1122 Type 2 diabetes mellitus with diabetic chronic kidney disease: Secondary | ICD-10-CM | POA: Diagnosis present

## 2022-06-19 DIAGNOSIS — I5033 Acute on chronic diastolic (congestive) heart failure: Secondary | ICD-10-CM | POA: Diagnosis present

## 2022-06-19 DIAGNOSIS — F17200 Nicotine dependence, unspecified, uncomplicated: Secondary | ICD-10-CM | POA: Diagnosis present

## 2022-06-19 DIAGNOSIS — T39395A Adverse effect of other nonsteroidal anti-inflammatory drugs [NSAID], initial encounter: Secondary | ICD-10-CM | POA: Diagnosis present

## 2022-06-19 DIAGNOSIS — J449 Chronic obstructive pulmonary disease, unspecified: Secondary | ICD-10-CM | POA: Diagnosis present

## 2022-06-19 DIAGNOSIS — R609 Edema, unspecified: Principal | ICD-10-CM

## 2022-06-19 LAB — CBC WITH DIFFERENTIAL/PLATELET
Abs Immature Granulocytes: 0.04 10*3/uL (ref 0.00–0.07)
Basophils Absolute: 0.1 10*3/uL (ref 0.0–0.1)
Basophils Relative: 1 %
Eosinophils Absolute: 0.2 10*3/uL (ref 0.0–0.5)
Eosinophils Relative: 3 %
HCT: 31.4 % — ABNORMAL LOW (ref 36.0–46.0)
Hemoglobin: 10.5 g/dL — ABNORMAL LOW (ref 12.0–15.0)
Immature Granulocytes: 1 %
Lymphocytes Relative: 27 %
Lymphs Abs: 2 10*3/uL (ref 0.7–4.0)
MCH: 29 pg (ref 26.0–34.0)
MCHC: 33.4 g/dL (ref 30.0–36.0)
MCV: 86.7 fL (ref 80.0–100.0)
Monocytes Absolute: 0.4 10*3/uL (ref 0.1–1.0)
Monocytes Relative: 6 %
Neutro Abs: 4.8 10*3/uL (ref 1.7–7.7)
Neutrophils Relative %: 62 %
Platelets: 346 10*3/uL (ref 150–400)
RBC: 3.62 MIL/uL — ABNORMAL LOW (ref 3.87–5.11)
RDW: 12.1 % (ref 11.5–15.5)
WBC: 7.5 10*3/uL (ref 4.0–10.5)
nRBC: 0 % (ref 0.0–0.2)

## 2022-06-19 LAB — COMPREHENSIVE METABOLIC PANEL
ALT: 12 U/L (ref 0–44)
AST: 11 U/L — ABNORMAL LOW (ref 15–41)
Albumin: 2.9 g/dL — ABNORMAL LOW (ref 3.5–5.0)
Alkaline Phosphatase: 127 U/L — ABNORMAL HIGH (ref 38–126)
Anion gap: 11 (ref 5–15)
BUN: 36 mg/dL — ABNORMAL HIGH (ref 6–20)
CO2: 32 mmol/L (ref 22–32)
Calcium: 8 mg/dL — ABNORMAL LOW (ref 8.9–10.3)
Chloride: 89 mmol/L — ABNORMAL LOW (ref 98–111)
Creatinine, Ser: 2.07 mg/dL — ABNORMAL HIGH (ref 0.44–1.00)
GFR, Estimated: 28 mL/min — ABNORMAL LOW (ref >60)
Glucose, Bld: 530 mg/dL (ref 70–99)
Potassium: 3.2 mmol/L — ABNORMAL LOW (ref 3.5–5.1)
Sodium: 132 mmol/L — ABNORMAL LOW (ref 135–145)
Total Bilirubin: 0.5 mg/dL (ref 0.3–1.2)
Total Protein: 7.4 g/dL (ref 6.5–8.1)

## 2022-06-19 LAB — TROPONIN I (HIGH SENSITIVITY)
Troponin I (High Sensitivity): 14 ng/L (ref <18)
Troponin I (High Sensitivity): 14 ng/L (ref <18)

## 2022-06-19 LAB — BRAIN NATRIURETIC PEPTIDE: B Natriuretic Peptide: 103.1 pg/mL — ABNORMAL HIGH (ref 0.0–100.0)

## 2022-06-19 LAB — D-DIMER, QUANTITATIVE: D-Dimer, Quant: <0.27 ug/mL-FEU (ref 0.00–0.50)

## 2022-06-19 LAB — MAGNESIUM: Magnesium: 1.3 mg/dL — ABNORMAL LOW (ref 1.7–2.4)

## 2022-06-19 MED ORDER — INSULIN ASPART 100 UNIT/ML IJ SOLN
6.0000 [IU] | Freq: Once | INTRAMUSCULAR | Status: AC
  Administered 2022-06-20: 6 [IU] via SUBCUTANEOUS

## 2022-06-19 MED ORDER — MAGNESIUM SULFATE 2 GM/50ML IV SOLN
2.0000 g | Freq: Once | INTRAVENOUS | Status: AC
  Administered 2022-06-20: 2 g via INTRAVENOUS
  Filled 2022-06-19: qty 50

## 2022-06-19 MED ORDER — TORSEMIDE 20 MG PO TABS
20.0000 mg | ORAL_TABLET | Freq: Every day | ORAL | 0 refills | Status: DC
  Filled 2022-06-19 – 2022-06-23 (×2): qty 5, 5d supply, fill #0

## 2022-06-19 MED ORDER — POTASSIUM CHLORIDE CRYS ER 20 MEQ PO TBCR
40.0000 meq | EXTENDED_RELEASE_TABLET | Freq: Once | ORAL | Status: AC
  Administered 2022-06-20: 40 meq via ORAL
  Filled 2022-06-19: qty 2

## 2022-06-19 NOTE — ED Provider Triage Note (Signed)
Emergency Medicine Provider Triage Evaluation Note  Hailey Klein , a 56 y.o. female  was evaluated in triage.  Pt complains of increased bilateral lower extremity edema, chest pain, shortness of breath for the past 2 weeks.  Patient states that her chest pain is left-sided and she rates it at 10 out of 10 in severity, described as sharp.  She complains of increasing shortness of breath which began around the same time as she noticed the increased dependent edema.  The patient also complains of nerve spasms which cause numbness in the fourth and fifth digit of her hands bilaterally.  She denies nausea, abdominal pain, headache  Review of Systems  Positive: Chest pain, shortness of breath, dependent edema Negative: Abdominal pain  Physical Exam  LMP 07/21/2015  Gen:   Awake, no distress   Resp:  Normal effort  MSK:   Moves extremities without difficulty  Other:  +1 edema noted bilaterally lower extremities  Medical Decision Making  Medically screening exam initiated at 3:22 PM.  Appropriate orders placed.  Hailey Klein was informed that the remainder of the evaluation will be completed by another provider, this initial triage assessment does not replace that evaluation, and the importance of remaining in the ED until their evaluation is complete.     Darrick Grinder, PA-C 06/19/22 1523

## 2022-06-19 NOTE — ED Triage Notes (Signed)
Patient here with complaint of left sided chest pain, lower extremity edema, and worsening shortness of breath that started two weeks ago and has gotten progressively worse. Seated in wheelchair patient is alert, oriented, speaking in complete sentences and is in no apparent distress at this time.

## 2022-06-19 NOTE — Discharge Instructions (Addendum)
Follow up with nephrologist and cardiologist Go to ED if you have worsening symptoms Take additional 20 mg torsemide (60 mg total) for 5 days. Check weights daily

## 2022-06-19 NOTE — ED Provider Notes (Signed)
[facility] EMERGENCY DEPARTMENT Provider Note   CSN: [id number] Arrival date & time: 06/19/22  1457     History  Chief Complaint  Patient presents with   Leg Swelling    Hailey Klein is a 56 y.o. female PMH HTN, DM, HFpEF who is presenting with bilateral lower extremity swelling.  Patient reports that this has been ongoing for the last 2 weeks, after she was diagnosed with gout in her right knee and had been taking a prescription medication for this. She does not remember the name of this med (per chart review, this medication was indomethacin). She has since noticed progressively worsening swelling of her left bilateral lower extremities, which is exceedingly painful, and is made it difficult to walk.  She also reports persistent knee pain, and says that she has "bone-on-bone" in both of her knees.  She denies any chest pain, difficulty breathing, orthopnea, nausea, vomiting, diarrhea, abdominal pain.  Home Medications Prior to Admission medications   Medication Sig Start Date End Date Taking? Authorizing Provider  acetaminophen (TYLENOL) 325 MG tablet Take 2 tablets (650 mg total) by mouth every 6 (six) hours as needed for mild pain. 12/09/15   Armandina Stammer I, NP  amLODipine (NORVASC) 10 MG tablet Take 10 mg by mouth daily.    [provider]  ARIPiprazole (ABILIFY) 20 MG tablet Take 1 tablet (20 mg total) by mouth at bedtime. For mood control 12/09/15   Armandina Stammer I, NP  carvedilol (COREG) 12.5 MG tablet Take 12.5 mg by mouth 2 (two) times daily with a meal.    [provider]  hydrOXYzine (ATARAX/VISTARIL) 25 MG tablet Take 1 tablet (25 mg total) by mouth every 6 (six) hours as needed for anxiety. 12/09/15   Armandina Stammer I, NP  indomethacin (INDOCIN) 25 MG capsule Take 1 capsule (25 mg total) by mouth 3 (three) times daily as needed. TAKE WITH FOOD 05/24/22   Crain, Whitney L, PA  losartan (COZAAR) 25 MG tablet Take 25 mg by mouth daily.    [provider]  metFORMIN (GLUCOPHAGE) 1000 MG tablet Take 1 tablet (1,000 mg total) by mouth 2 (two) times daily with a meal. For diabetes management 12/09/15   Armandina Stammer I, NP  pregabalin (LYRICA) 100 MG capsule Take 100 mg by mouth 2 (two) times daily.    [provider]  torsemide (DEMADEX) 20 MG tablet Take 40 mg by mouth daily.    [provider]  torsemide (DEMADEX) 20 MG tablet Take 1 tablet (20 mg total) by mouth daily for 5 days. 06/19/22 06/24/22  Evern Core, PA-C  traZODone (DESYREL) 100 MG tablet Take 1 tablet (100 mg total) by mouth at bedtime. For insomnia 12/09/15   Armandina Stammer I, NP      Allergies    Aspirin    Review of Systems   Review of Systems As in HPI.  Physical Exam Updated Vital Signs BP 115/70   Pulse 68   Temp 98.5 F (36.9 C) (Oral)   Resp 16   LMP 07/21/2015   SpO2 99%  Physical Exam Vitals and nursing note reviewed.  Constitutional:      General: She is not in acute distress.    Appearance: Normal appearance. She is well-developed. She is obese. She is not ill-appearing or diaphoretic.  HENT:     Head: Normocephalic and atraumatic.     Right Ear: External ear normal.     Left Ear: External ear normal.  Nose: Nose normal.  Cardiovascular:     Rate and Rhythm: Normal rate and regular rhythm.     Heart sounds: No murmur heard. Pulmonary:     Effort: Pulmonary effort is normal. No respiratory distress.     Breath sounds: Normal breath sounds. No wheezing.  Abdominal:     General: There is no distension.     Palpations: Abdomen is soft.     Tenderness: There is no abdominal tenderness. There is no guarding.  Musculoskeletal:     Cervical back: Neck supple.     Right lower leg: Edema present.     Left lower leg: Edema present.     Comments: Pitting edema bilateral lower extremities to the level of the knees.  Skin:    General: Skin is warm and dry.  Neurological:     Mental Status: She is alert.     ED Results /  Procedures / Treatments   Labs (all labs ordered are listed, but only abnormal results are displayed) Labs Reviewed  CBC WITH DIFFERENTIAL/PLATELET - Abnormal; Notable for the following components:      Result Value   RBC 3.62 (*)    Hemoglobin 10.5 (*)    HCT 31.4 (*)    All other components within normal limits  BRAIN NATRIURETIC PEPTIDE - Abnormal; Notable for the following components:   B Natriuretic Peptide 103.1 (*)    All other components within normal limits  COMPREHENSIVE METABOLIC PANEL - Abnormal; Notable for the following components:   Sodium 132 (*)    Potassium 3.2 (*)    Chloride 89 (*)    Glucose, Bld 530 (*)    BUN 36 (*)    Creatinine, Ser 2.07 (*)    Calcium 8.0 (*)    Albumin 2.9 (*)    AST 11 (*)    Alkaline Phosphatase 127 (*)    GFR, Estimated 28 (*)    All other components within normal limits  D-DIMER, QUANTITATIVE  TROPONIN I (HIGH SENSITIVITY)  TROPONIN I (HIGH SENSITIVITY)    Radiology DG Chest 2 View  Result Date: 06/19/2022 CLINICAL DATA:  Left-sided chest pain and shortness of breath EXAM: CHEST - 2 VIEW COMPARISON:  12/01/2015 FINDINGS: Heart size upper limits of normal. Mild aortic tortuosity. The lungs are clear. The pulmonary vascularity is normal. No effusions. Mild curvature and degenerative change of the spine. IMPRESSION: No active cardiopulmonary disease. Electronically Signed   By: Paulina Fusi M.D.   On: 06/19/2022 16:07    Procedures Procedures   Medications Ordered in ED Medications - No data to display  ED Course/ Medical Decision Making/ A&P                           Medical Decision Making Amount and/or Complexity of Data Reviewed Labs: ordered.  Risk Prescription drug management. Decision regarding hospitalization.   Hailey Klein is a 56 y.o. female with significant PMHx HTN, DM, reported CHF who presented to the ED with bilateral lower extremity edema.  Vitals at presentation within normal limits.  Patient is  hemodynamically stable, afebrile, satting well on room air.  Physical exam with normal heart sounds.  Lungs clear to auscultation bilaterally.  Abdomen is soft, nontender to palpation.  Pitting edema to bilateral lower extremities.  Initial differential includes but is not limited to: CHF exacerbation, DVT  Lab work obtained, resulted notable for CMP with multiple derangements.  Glucose significantly elevated at 530.  Creatinine 2.07,  up from recent blood work obtained at outside facility in May, where creatinine was 1.28.  Potassium low at 3.2.  Oral repletion given.  No anion gap.  D-dimer negative.  CBC without leukocytosis.  Hemoglobin 10.5, not significantly changed from prior.  BNP 103.1.  Troponin negative x2.  Magnesium 1.3.  IV repletion given.  Imaging was pursued, notable for chest x-ray without significant pulmonary edema.  EKG obtained, demonstrates sinus rhythm, ventricular rate 68 bpm. Bifascicular block seen. No evidence of acute ischemia.  Qtc mildly prolonged at 484 ms. Interpreted by myself and my attending.  Interventions include subcutaneous insulin, PO potassium, IV magnesium  Patient with multiple medical problems including CHF, evidence of volume overload with peripheral edema, significant hyperglycemia without anion gap, and electrolyte derangements.  Patient will benefit from formal echo to evaluate heart function prior to significant IV fluids for hyperglycemia, particular in the setting of volume overload.  Patient CHF exacerbation versus swelling of peripheral lower extremities may be due to AKI in the setting of recent indomethacin use.  We will admit the patient to medicine for additional evaluation and medical management as above.  The plan for this patient was discussed with Dr. Jodi Mourning, who voiced agreement and who oversaw evaluation and treatment of this patient.    Final Clinical Impression(s) / ED Diagnoses Final diagnoses:  Peripheral edema  AKI (acute  kidney injury) (HCC)  Hyperglycemia due to diabetes mellitus [facility])    Rx / DC Orders ED Discharge Orders     None         Skeet Simmer, MD 06/19/22 2350    Blane Ohara, MD 06/21/22 [phone]

## 2022-06-19 NOTE — ED Provider Notes (Addendum)
[facility]    CSN: [id number] Arrival date & time: 06/19/22  1311      History   Chief Complaint Chief Complaint  Patient presents with   Numbness    HPI Hailey Klein is a 56 y.o. female.   Patient here with complex medical history, including HTN, DM, HFpEF, CKF.  Here c/w b/l LE swelling x 3 weeks.  She is not checking weights.  She states she's taking her medications as prescribed.  She report some increased orthopnea.  She states SOB is at baseline. Per chart review she was seen by her PCP 2 months ago, Toresemide increased to 40 mg at that time, she reports she continues to take it at that dose.  She was referred to nephrologist and cardiologist but she thinks she missed the appointments.  GFR 50 03/2022.    She's also concerned with numbness digits 4/5 b/l hands.  No injury.  She hasn't taken any medication for this.  On going last 3 - 4 weeks.    Past Medical History:  Diagnosis Date   Diabetes mellitus without complication (HCC)    H/O suicide attempt    cut wrists - 19-y-o   Hypertension    PTSD (post-traumatic stress disorder)     Patient Active Problem List   Diagnosis Date Noted   Alcohol use disorder, moderate, in sustained remission (HCC) 12/02/2015   Cocaine use disorder, moderate, in sustained remission (HCC) 12/02/2015   Opioid use disorder, moderate, in sustained remission (HCC) 12/02/2015   Cannabis use disorder, mild, abuse 12/02/2015   PTSD (post-traumatic stress disorder) 12/01/2015   MDD (major depressive disorder), recurrent, severe, 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 No pertinent surgical history.  OB History   No obstetric history on file.      Home Medications    Prior to Admission medications   Medication Sig Start Date End Date Taking? Authorizing Provider  acetaminophen (TYLENOL) 325 MG tablet Take 2 tablets (650 mg total) by mouth every 6 (six) hours as needed for mild pain. 12/09/15  Yes Armandina Stammer  I, NP  amLODipine (NORVASC) 10 MG tablet Take 10 mg by mouth daily.   Yes [provider]  ARIPiprazole (ABILIFY) 20 MG tablet Take 1 tablet (20 mg total) by mouth at bedtime. For mood control 12/09/15  Yes Nwoko, Nicole Kindred I, NP  carvedilol (COREG) 12.5 MG tablet Take 12.5 mg by mouth 2 (two) times daily with a meal.   Yes [provider]  hydrOXYzine (ATARAX/VISTARIL) 25 MG tablet Take 1 tablet (25 mg total) by mouth every 6 (six) hours as needed for anxiety. 12/09/15  Yes Armandina Stammer I, NP  indomethacin (INDOCIN) 25 MG capsule Take 1 capsule (25 mg total) by mouth 3 (three) times daily as needed. TAKE WITH FOOD 05/24/22  Yes Crain, Whitney L, PA  losartan (COZAAR) 25 MG tablet Take 25 mg by mouth daily.   Yes [provider]  metFORMIN (GLUCOPHAGE) 1000 MG tablet Take 1 tablet (1,000 mg total) by mouth 2 (two) times daily with a meal. For diabetes management 12/09/15  Yes Armandina Stammer I, NP  pregabalin (LYRICA) 100 MG capsule Take 100 mg by mouth 2 (two) times daily.   Yes [provider]  torsemide (DEMADEX) 20 MG tablet Take 40 mg by mouth daily.   Yes [provider]  torsemide (DEMADEX) 20 MG tablet Take 1 tablet (20 mg total) by mouth daily for 5 days. 06/19/22 06/24/22 Yes Evern Core,  PA-C  traZODone (DESYREL) 100 MG tablet Take 1 tablet (100 mg total) by mouth at bedtime. For insomnia 12/09/15  Yes Sanjuana Kava, NP    Family History Family History  Problem Relation Age of Onset   Alcoholism Other     Social History Social History   Tobacco Use   Smoking status: Former  Substance Use Topics   Alcohol use: Yes    Comment: etoh daily- 24 pack a day and 1/5 of gin daily. sober for 38 days   Drug use: Yes    Types: "Crack" cocaine    Comment: crack cocaine binge 1 mth ago. thc over 5 years ago.      Allergies   Aspirin   Review of Systems Review of Systems  Constitutional:  Negative for chills, fatigue and fever.  HENT:  Negative  for congestion, ear pain, nosebleeds, postnasal drip, rhinorrhea, sinus pressure, sinus pain and sore throat.   Eyes:  Negative for pain and redness.  Respiratory:  Negative for cough, shortness of breath and wheezing.   Cardiovascular:  Positive for leg swelling. Negative for chest pain and palpitations.  Gastrointestinal:  Negative for abdominal pain, diarrhea, nausea and vomiting.  Musculoskeletal:  Negative for arthralgias and myalgias.  Skin:  Negative for rash.  Neurological:  Positive for numbness. Negative for dizziness, light-headedness and headaches.  Hematological:  Negative for adenopathy. Does not bruise/bleed easily.  Psychiatric/Behavioral:  Negative for confusion and sleep disturbance.      Physical Exam Triage Vital Signs ED Triage Vitals  Enc Vitals Group     BP 06/19/22 1344 106/70     Pulse Rate 06/19/22 1344 72     Resp 06/19/22 1344 18     Temp 06/19/22 1344 98.5 F (36.9 C)     Temp Source 06/19/22 1344 Oral     SpO2 06/19/22 1344 95 %     Weight --      Height --      Head Circumference --      Peak Flow --      Pain Score 06/19/22 1342 10     Pain Loc --      Pain Edu? --      Excl. in GC? --    No data found.  Updated Vital Signs BP 106/70 (BP Location: Left Arm)   Pulse 72   Temp 98.5 F (36.9 C) (Oral)   Resp 18   LMP 07/21/2015   SpO2 95%   Visual Acuity Right Eye Distance:   Left Eye Distance:   Bilateral Distance:    Right Eye Near:   Left Eye Near:    Bilateral Near:     Physical Exam Vitals and nursing note reviewed.  Constitutional:      General: She is not in acute distress.    Appearance: Normal appearance. She is not ill-appearing.  HENT:     Head: Normocephalic and atraumatic.  Eyes:     General: No scleral icterus.    Extraocular Movements: Extraocular movements intact.     Conjunctiva/sclera: Conjunctivae normal.  Cardiovascular:     Rate and Rhythm: Normal rate and regular rhythm.     Heart sounds: No murmur  heard. Pulmonary:     Effort: Pulmonary effort is normal. No respiratory distress.     Breath sounds: Normal breath sounds. No wheezing or rales.  Musculoskeletal:     Right elbow: Normal range of motion. No tenderness.     Left elbow: Normal range of motion.  No tenderness.     Right wrist: No tenderness or bony tenderness. Normal range of motion.     Left wrist: No tenderness or bony tenderness. Normal range of motion.     Right hand: No tenderness or bony tenderness. Normal range of motion.     Left hand: No tenderness or bony tenderness. Normal range of motion.     Cervical back: Normal range of motion. No rigidity.     Right lower leg: Edema present.     Left lower leg: Edema present.     Comments: Negative tinel's and phalans  Skin:    Coloration: Skin is not jaundiced.     Findings: No rash.  Neurological:     General: No focal deficit present.     Mental Status: She is alert and oriented to person, place, and time.     Motor: No weakness.  Psychiatric:        Mood and Affect: Mood normal.        Behavior: Behavior normal.      UC Treatments / Results  Labs (all labs ordered are listed, but only abnormal results are displayed) Labs Reviewed - No data to display  EKG   Radiology No results found.  Procedures Procedures (including critical care time)  Medications Ordered in UC Medications - No data to display  Initial Impression / Assessment and Plan / UC Course  I have reviewed the triage vital signs and the nursing notes.  Pertinent labs & imaging results that were available during my care of the patient were reviewed by me and considered in my medical decision making (see chart for details).     Strict ED precautions provided Increase toresemdie to 60 mg for 5 days Contact PCP - go to nephrologist and cardiologist Apply ice to wrists / elbows Discussed with Dr. Renato Gails. Final Clinical Impressions(s) / UC Diagnoses   Final diagnoses:  Bilateral leg  edema  Chronic congestive heart failure, unspecified heart failure type (HCC)  Ulnar tunnel syndrome, unspecified laterality     Discharge Instructions      Follow up with nephrologist and cardiologist Go to ED if you have worsening symptoms Take additional 20 mg torsemide (60 mg total) for 5 days. Check weights daily     ED Prescriptions     Medication Sig Dispense Auth. Provider   torsemide (DEMADEX) 20 MG tablet Take 1 tablet (20 mg total) by mouth daily for 5 days. 5 tablet Evern Core, PA-C      PDMP not reviewed this encounter.   Evern Core, PA-C 06/19/22 1447    Evern Core, PA-C 06/19/22 1620

## 2022-06-19 NOTE — ED Triage Notes (Signed)
Patient has swelling bilateral lower leg. She was seen like 3 weeks ago for the same issues. "Jerking" all over body for about a week. She is having numbness  in both hand. Has been soaking her feet in Epsom salt and it does help a little bit. It hurts to walk.

## 2022-06-20 ENCOUNTER — Inpatient Hospital Stay (HOSPITAL_COMMUNITY): Payer: Medicaid Other

## 2022-06-20 ENCOUNTER — Encounter (HOSPITAL_COMMUNITY): Payer: Self-pay | Admitting: Internal Medicine

## 2022-06-20 DIAGNOSIS — F4310 Post-traumatic stress disorder, unspecified: Secondary | ICD-10-CM | POA: Diagnosis present

## 2022-06-20 DIAGNOSIS — E871 Hypo-osmolality and hyponatremia: Secondary | ICD-10-CM | POA: Diagnosis present

## 2022-06-20 DIAGNOSIS — F17200 Nicotine dependence, unspecified, uncomplicated: Secondary | ICD-10-CM | POA: Diagnosis present

## 2022-06-20 DIAGNOSIS — E1165 Type 2 diabetes mellitus with hyperglycemia: Secondary | ICD-10-CM | POA: Diagnosis present

## 2022-06-20 DIAGNOSIS — I130 Hypertensive heart and chronic kidney disease with heart failure and stage 1 through stage 4 chronic kidney disease, or unspecified chronic kidney disease: Secondary | ICD-10-CM | POA: Diagnosis present

## 2022-06-20 DIAGNOSIS — I509 Heart failure, unspecified: Secondary | ICD-10-CM

## 2022-06-20 DIAGNOSIS — E1169 Type 2 diabetes mellitus with other specified complication: Secondary | ICD-10-CM | POA: Diagnosis present

## 2022-06-20 DIAGNOSIS — N179 Acute kidney failure, unspecified: Secondary | ICD-10-CM

## 2022-06-20 DIAGNOSIS — I5033 Acute on chronic diastolic (congestive) heart failure: Secondary | ICD-10-CM

## 2022-06-20 DIAGNOSIS — Z86718 Personal history of other venous thrombosis and embolism: Secondary | ICD-10-CM | POA: Diagnosis not present

## 2022-06-20 DIAGNOSIS — I823 Embolism and thrombosis of renal vein: Secondary | ICD-10-CM | POA: Diagnosis present

## 2022-06-20 DIAGNOSIS — I452 Bifascicular block: Secondary | ICD-10-CM | POA: Diagnosis present

## 2022-06-20 DIAGNOSIS — I10 Essential (primary) hypertension: Secondary | ICD-10-CM | POA: Diagnosis not present

## 2022-06-20 DIAGNOSIS — R609 Edema, unspecified: Secondary | ICD-10-CM | POA: Diagnosis not present

## 2022-06-20 DIAGNOSIS — D631 Anemia in chronic kidney disease: Secondary | ICD-10-CM | POA: Diagnosis present

## 2022-06-20 DIAGNOSIS — E1122 Type 2 diabetes mellitus with diabetic chronic kidney disease: Secondary | ICD-10-CM | POA: Diagnosis present

## 2022-06-20 DIAGNOSIS — Z6841 Body Mass Index (BMI) 40.0 and over, adult: Secondary | ICD-10-CM | POA: Diagnosis not present

## 2022-06-20 DIAGNOSIS — M10261 Drug-induced gout, right knee: Secondary | ICD-10-CM | POA: Diagnosis not present

## 2022-06-20 DIAGNOSIS — E785 Hyperlipidemia, unspecified: Secondary | ICD-10-CM | POA: Diagnosis present

## 2022-06-20 DIAGNOSIS — E8809 Other disorders of plasma-protein metabolism, not elsewhere classified: Secondary | ICD-10-CM | POA: Diagnosis present

## 2022-06-20 DIAGNOSIS — M109 Gout, unspecified: Secondary | ICD-10-CM | POA: Diagnosis present

## 2022-06-20 DIAGNOSIS — R600 Localized edema: Secondary | ICD-10-CM | POA: Diagnosis present

## 2022-06-20 DIAGNOSIS — Z79899 Other long term (current) drug therapy: Secondary | ICD-10-CM | POA: Diagnosis not present

## 2022-06-20 DIAGNOSIS — E876 Hypokalemia: Secondary | ICD-10-CM | POA: Diagnosis present

## 2022-06-20 DIAGNOSIS — N1831 Chronic kidney disease, stage 3a: Secondary | ICD-10-CM | POA: Diagnosis present

## 2022-06-20 DIAGNOSIS — Z7984 Long term (current) use of oral hypoglycemic drugs: Secondary | ICD-10-CM | POA: Diagnosis not present

## 2022-06-20 DIAGNOSIS — J449 Chronic obstructive pulmonary disease, unspecified: Secondary | ICD-10-CM | POA: Diagnosis present

## 2022-06-20 LAB — CBC
HCT: 30.3 % — ABNORMAL LOW (ref 36.0–46.0)
Hemoglobin: 10.2 g/dL — ABNORMAL LOW (ref 12.0–15.0)
MCH: 29.1 pg (ref 26.0–34.0)
MCHC: 33.7 g/dL (ref 30.0–36.0)
MCV: 86.6 fL (ref 80.0–100.0)
Platelets: 317 10*3/uL (ref 150–400)
RBC: 3.5 MIL/uL — ABNORMAL LOW (ref 3.87–5.11)
RDW: 12.3 % (ref 11.5–15.5)
WBC: 7.9 10*3/uL (ref 4.0–10.5)
nRBC: 0 % (ref 0.0–0.2)

## 2022-06-20 LAB — URINALYSIS, ROUTINE W REFLEX MICROSCOPIC
Bacteria, UA: NONE SEEN
Bilirubin Urine: NEGATIVE
Glucose, UA: >=500 mg/dL — AB
Ketones, ur: NEGATIVE mg/dL
Leukocytes,Ua: NEGATIVE
Nitrite: NEGATIVE
Protein, ur: NEGATIVE mg/dL
Specific Gravity, Urine: 1.011 (ref 1.005–1.030)
pH: 5 (ref 5.0–8.0)

## 2022-06-20 LAB — BASIC METABOLIC PANEL
Anion gap: 14 (ref 5–15)
Anion gap: 12 (ref 5–15)
BUN: 39 mg/dL — ABNORMAL HIGH (ref 6–20)
BUN: 39 mg/dL — ABNORMAL HIGH (ref 6–20)
CO2: 30 mmol/L (ref 22–32)
CO2: 32 mmol/L (ref 22–32)
Calcium: 7.8 mg/dL — ABNORMAL LOW (ref 8.9–10.3)
Calcium: 8 mg/dL — ABNORMAL LOW (ref 8.9–10.3)
Chloride: 90 mmol/L — ABNORMAL LOW (ref 98–111)
Chloride: 88 mmol/L — ABNORMAL LOW (ref 98–111)
Creatinine, Ser: 1.81 mg/dL — ABNORMAL HIGH (ref 0.44–1.00)
Creatinine, Ser: 1.78 mg/dL — ABNORMAL HIGH (ref 0.44–1.00)
GFR, Estimated: 33 mL/min — ABNORMAL LOW (ref >60)
GFR, Estimated: 34 mL/min — ABNORMAL LOW (ref >60)
Glucose, Bld: 539 mg/dL (ref 70–99)
Glucose, Bld: 476 mg/dL — ABNORMAL HIGH (ref 70–99)
Potassium: 2.9 mmol/L — ABNORMAL LOW (ref 3.5–5.1)
Potassium: 3 mmol/L — ABNORMAL LOW (ref 3.5–5.1)
Sodium: 134 mmol/L — ABNORMAL LOW (ref 135–145)
Sodium: 132 mmol/L — ABNORMAL LOW (ref 135–145)

## 2022-06-20 LAB — HEPATIC FUNCTION PANEL
ALT: 11 U/L (ref 0–44)
AST: 9 U/L — ABNORMAL LOW (ref 15–41)
Albumin: 2.6 g/dL — ABNORMAL LOW (ref 3.5–5.0)
Alkaline Phosphatase: 105 U/L (ref 38–126)
Bilirubin, Direct: 0.1 mg/dL (ref 0.0–0.2)
Indirect Bilirubin: 0.4 mg/dL (ref 0.3–0.9)
Total Bilirubin: 0.5 mg/dL (ref 0.3–1.2)
Total Protein: 6.5 g/dL (ref 6.5–8.1)

## 2022-06-20 LAB — HEMOGLOBIN A1C
Hgb A1c MFr Bld: 12.7 % — ABNORMAL HIGH (ref 4.8–5.6)
Mean Plasma Glucose: 317.79 mg/dL

## 2022-06-20 LAB — HIV ANTIBODY (ROUTINE TESTING W REFLEX): HIV Screen 4th Generation wRfx: NONREACTIVE

## 2022-06-20 LAB — GLUCOSE, CAPILLARY
Glucose-Capillary: 289 mg/dL — ABNORMAL HIGH (ref 70–99)
Glucose-Capillary: 170 mg/dL — ABNORMAL HIGH (ref 70–99)
Glucose-Capillary: 117 mg/dL — ABNORMAL HIGH (ref 70–99)

## 2022-06-20 LAB — MAGNESIUM: Magnesium: 1.7 mg/dL (ref 1.7–2.4)

## 2022-06-20 LAB — CBG MONITORING, ED
Glucose-Capillary: 446 mg/dL — ABNORMAL HIGH (ref 70–99)
Glucose-Capillary: 490 mg/dL — ABNORMAL HIGH (ref 70–99)
Glucose-Capillary: 575 mg/dL (ref 70–99)

## 2022-06-20 LAB — ECHOCARDIOGRAM COMPLETE
Area-P 1/2: 2.95 cm2
Height: 63 in
S' Lateral: 3.15 cm
Weight: 5298.09 oz

## 2022-06-20 LAB — URIC ACID: Uric Acid, Serum: 9.3 mg/dL — ABNORMAL HIGH (ref 2.5–7.1)

## 2022-06-20 LAB — TSH: TSH: 3.547 u[IU]/mL (ref 0.350–4.500)

## 2022-06-20 MED ORDER — INSULIN GLARGINE-YFGN 100 UNIT/ML ~~LOC~~ SOLN
30.0000 [IU] | Freq: Every day | SUBCUTANEOUS | Status: DC
  Administered 2022-06-20 – 2022-06-21 (×2): 30 [IU] via SUBCUTANEOUS
  Filled 2022-06-20 (×3): qty 0.3

## 2022-06-20 MED ORDER — POTASSIUM CHLORIDE CRYS ER 20 MEQ PO TBCR
40.0000 meq | EXTENDED_RELEASE_TABLET | Freq: Two times a day (BID) | ORAL | Status: AC
Start: 2022-06-20 — End: 2022-06-20
  Administered 2022-06-20 (×2): 40 meq via ORAL
  Filled 2022-06-20 (×2): qty 2

## 2022-06-20 MED ORDER — FUROSEMIDE 10 MG/ML IJ SOLN
60.0000 mg | Freq: Once | INTRAMUSCULAR | Status: AC
  Administered 2022-06-20: 60 mg via INTRAVENOUS
  Filled 2022-06-20: qty 6

## 2022-06-20 MED ORDER — INSULIN ASPART 100 UNIT/ML IJ SOLN
6.0000 [IU] | Freq: Three times a day (TID) | INTRAMUSCULAR | Status: DC
  Administered 2022-06-20 – 2022-06-21 (×4): 6 [IU] via SUBCUTANEOUS

## 2022-06-20 MED ORDER — AMLODIPINE BESYLATE 10 MG PO TABS
10.0000 mg | ORAL_TABLET | Freq: Every day | ORAL | Status: DC
  Administered 2022-06-20: 10 mg via ORAL
  Filled 2022-06-20: qty 2

## 2022-06-20 MED ORDER — RIVAROXABAN 20 MG PO TABS
20.0000 mg | ORAL_TABLET | Freq: Every day | ORAL | Status: DC
Start: 2022-06-20 — End: 2022-06-24
  Administered 2022-06-20 – 2022-06-23 (×5): 20 mg via ORAL
  Filled 2022-06-20 (×4): qty 1
  Filled 2022-06-20 (×2): qty 2

## 2022-06-20 MED ORDER — PERFLUTREN LIPID MICROSPHERE
1.0000 mL | INTRAVENOUS | Status: AC | PRN
  Administered 2022-06-20: 2 mL via INTRAVENOUS

## 2022-06-20 MED ORDER — PREGABALIN 100 MG PO CAPS
100.0000 mg | ORAL_CAPSULE | Freq: Two times a day (BID) | ORAL | Status: DC
  Administered 2022-06-20 – 2022-06-24 (×10): 100 mg via ORAL
  Filled 2022-06-20 (×10): qty 1

## 2022-06-20 MED ORDER — FUROSEMIDE 10 MG/ML IJ SOLN
60.0000 mg | Freq: Two times a day (BID) | INTRAMUSCULAR | Status: DC
Start: 2022-06-20 — End: 2022-06-22
  Administered 2022-06-20 – 2022-06-22 (×5): 60 mg via INTRAVENOUS
  Filled 2022-06-20 (×5): qty 6

## 2022-06-20 MED ORDER — INSULIN NPH (HUMAN) (ISOPHANE) 100 UNIT/ML ~~LOC~~ SUSP
20.0000 [IU] | Freq: Once | SUBCUTANEOUS | Status: AC
  Administered 2022-06-20: 20 [IU] via SUBCUTANEOUS
  Filled 2022-06-20: qty 10

## 2022-06-20 MED ORDER — HYDROXYZINE HCL 25 MG PO TABS
25.0000 mg | ORAL_TABLET | Freq: Four times a day (QID) | ORAL | Status: DC | PRN
  Administered 2022-06-20: 25 mg via ORAL
  Filled 2022-06-20: qty 1

## 2022-06-20 MED ORDER — CARVEDILOL 12.5 MG PO TABS
12.5000 mg | ORAL_TABLET | Freq: Two times a day (BID) | ORAL | Status: DC
  Administered 2022-06-20 – 2022-06-24 (×9): 12.5 mg via ORAL
  Filled 2022-06-20 (×9): qty 1

## 2022-06-20 MED ORDER — ARIPIPRAZOLE 5 MG PO TABS
20.0000 mg | ORAL_TABLET | Freq: Every day | ORAL | Status: DC
  Administered 2022-06-20 – 2022-06-23 (×5): 20 mg via ORAL
  Filled 2022-06-20 (×2): qty 4
  Filled 2022-06-20: qty 2
  Filled 2022-06-20 (×2): qty 4

## 2022-06-20 MED ORDER — INSULIN ASPART 100 UNIT/ML IJ SOLN
0.0000 [IU] | Freq: Three times a day (TID) | INTRAMUSCULAR | Status: DC
  Administered 2022-06-20: 16 [IU] via SUBCUTANEOUS
  Administered 2022-06-20: 3 [IU] via SUBCUTANEOUS
  Administered 2022-06-20: 8 [IU] via SUBCUTANEOUS
  Administered 2022-06-21: 5 [IU] via SUBCUTANEOUS
  Administered 2022-06-21: 3 [IU] via SUBCUTANEOUS
  Administered 2022-06-21: 2 [IU] via SUBCUTANEOUS
  Administered 2022-06-22 (×2): 8 [IU] via SUBCUTANEOUS
  Administered 2022-06-22: 3 [IU] via SUBCUTANEOUS

## 2022-06-20 MED ORDER — ACETAMINOPHEN 325 MG PO TABS
650.0000 mg | ORAL_TABLET | Freq: Four times a day (QID) | ORAL | Status: DC | PRN
  Administered 2022-06-20 – 2022-06-23 (×6): 650 mg via ORAL
  Filled 2022-06-20 (×6): qty 2

## 2022-06-20 MED ORDER — INSULIN GLARGINE-YFGN 100 UNIT/ML ~~LOC~~ SOLN
24.0000 [IU] | Freq: Every day | SUBCUTANEOUS | Status: DC
  Administered 2022-06-20: 24 [IU] via SUBCUTANEOUS
  Filled 2022-06-20 (×2): qty 0.24

## 2022-06-20 NOTE — ED Notes (Signed)
MD notified of pt CBG of 446. Waiting for further instructions on insulin dose.

## 2022-06-20 NOTE — ED Notes (Signed)
Lab to add on BMP and Uric acid

## 2022-06-20 NOTE — ED Notes (Signed)
Taken to echo by transporter. 

## 2022-06-20 NOTE — ED Notes (Signed)
IV team at bedside at this time. 

## 2022-06-20 NOTE — ED Notes (Signed)
Per Dr.Joseph, give pt 16 units Novolog now and long acting insulin is also ordered. Will continue to monitor.

## 2022-06-20 NOTE — ED Notes (Signed)
Ultrasound tech at bedside

## 2022-06-20 NOTE — Progress Notes (Signed)
Patient seen and examined, admitted earlier this morning by Dr. 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  Reportedly recently diagnosed with gout flare treated with indomethacin for 5 days In the ED chest x-ray was unremarkable, blood glucose was 567, BNP 103, creatinine was 2  Acute on chronic diastolic CHF -Follow-up repeat echo, continue IV Lasix today, monitor I's/O, daily weights -BMP in a.m. -Patient reports compliance with torsemide however does not follow a low-sodium diet  Uncontrolled type 2 diabetes mellitus with hyperglycemia -A1c was 13.4 in 2020 -CBGs in the 500s on admission, increase Lantus, add meal coverage NovoLog -Follow-up repeat A1c  AKI on CKD 3a -Baseline creatinine 1.3, worsened to 2.0 on admission likely secondary to NSAIDs and ARB use -Follow-up renal ultrasound, creatinine improving, monitor with diuresis  Hypertension -Hold amlodipine, continue Coreg and diuretics  Chronic anemia -Monitor  History of renal vein thrombosis -On Xarelto, continued  Hypoalbuminemia -UA without proteinuria, could have liver disease -Check INR  Morbid obesity -BMI is 58.6, lifestyle modification recommended  Suspected OSA -Has not been able to have a sleep study yet  Zannie Cove, MD

## 2022-06-20 NOTE — ED Notes (Signed)
MD notified of critical glucose.

## 2022-06-20 NOTE — H&P (Addendum)
History and Physical    Hailey Klein DJM:[id number] DOB: [birth year] DOA: 06/19/2022  PCP: [facility]  Patient coming from: Home.  Chief Complaint: Lower extremity edema.  HPI: Hailey Klein is a 56 y.o. 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  Patient was recently diagnosed with gout attack and was placed on indomethacin for 5 days.  Patient states her lower extremity swelling has increased despite taking torsemide as advised.  Denies chest pain no fever chills.  Still has pain both knees.  ED Course: In the ER chest x-ray was unremarkable.  Blood glucose was 530 BNP 103 and anion gap was 11 magnesium 1.3 potassium 3.2 creatinine has increased to 2 from 1.3 two months ago.  Patient was given potassium replacement magnesium replacement admitted for likely worsening of CHF and peripheral edema.  Review of Systems: As per HPI, rest all negative.   Past Medical History:  Diagnosis Date   Diabetes mellitus without complication (HCC)    H/O suicide attempt    cut wrists - 19-y-o   Hypertension    PTSD (post-traumatic stress disorder)     History reviewed. No pertinent surgical history.   reports that she has quit smoking. She does not have any smokeless tobacco history on file. She reports current alcohol use. She reports current drug use. Drug: "Crack" cocaine.  Allergies  Allergen Reactions   Aspirin     rash    Family History  Problem Relation Age of Onset   Alcoholism Other     Prior to Admission medications   Medication Sig Start Date End Date Taking? Authorizing Provider  acetaminophen (TYLENOL)  325 MG tablet Take 2 tablets (650 mg total) by mouth every 6 (six) hours as needed for mild pain. 12/09/15   Armandina Stammer I, NP  amLODipine (NORVASC) 10 MG tablet Take 10 mg by mouth daily.    [provider]  ARIPiprazole (ABILIFY) 20 MG tablet Take 1 tablet (20 mg total) by mouth at bedtime. For mood control 12/09/15   Armandina Stammer I, NP  carvedilol (COREG) 12.5 MG tablet Take 12.5 mg by mouth 2 (two) times daily with a meal.    [provider]  hydrOXYzine (ATARAX/VISTARIL) 25 MG tablet Take 1 tablet (25 mg total) by mouth every 6 (six) hours as needed for anxiety. 12/09/15   Armandina Stammer I, NP  indomethacin (INDOCIN) 25 MG capsule Take 1 capsule (25 mg total) by mouth 3 (three) times daily as needed. TAKE WITH FOOD 05/24/22   Crain, Whitney L, PA  losartan (COZAAR) 25 MG tablet Take 25 mg by mouth daily.    [provider]  metFORMIN (GLUCOPHAGE) 1000 MG tablet Take 1 tablet (1,000 mg total) by mouth 2 (two) times daily with a meal. For diabetes management 12/09/15   Armandina Stammer I, NP  pregabalin (LYRICA) 100 MG capsule Take 100 mg by mouth 2 (two) times daily.    [provider]  torsemide (DEMADEX) 20 MG tablet Take 40 mg by mouth daily.    [provider]  torsemide (DEMADEX) 20 MG tablet Take 1 tablet (20 mg total) by mouth daily for  5 days. 06/19/22 06/24/22  Peri Jefferson, PA-C  traZODone (DESYREL) 100 MG tablet Take 1 tablet (100 mg total) by mouth at bedtime. For insomnia 12/09/15   Encarnacion Slates, NP    Physical Exam: Constitutional: Moderately built and nourished. Vitals:   06/20/22 0002 06/20/22 0030 06/20/22 0045 06/20/22 0100  BP:  123/64 137/70 134/82  Pulse:  70 72 71  Resp:      Temp: 98.3 F (36.8 C)     TempSrc: Oral     SpO2:  99% 98% 96%   Eyes: Anicteric no pallor. ENMT: No discharge from the ears eyes nose and mouth. Neck: No mass felt.  No neck rigidity. Respiratory: No rhonchi or crepitations. Cardiovascular: S1-S2  heard. Abdomen: Soft nontender bowel sound present. Musculoskeletal: Bilateral lower extremity extending up to the thighs. Skin: No rash. Neurologic: Alert awake oriented to time place and person.  Moves all extremities. Psychiatric: Appears normal.  Normal affect.   Labs on Admission: I have personally reviewed following labs and imaging studies  CBC: Recent Labs  Lab 06/19/22 1534  WBC 7.5  NEUTROABS 4.8  HGB 10.5*  HCT 31.4*  MCV 86.7  PLT 123456   Basic Metabolic Panel: Recent Labs  Lab 06/19/22 1534 06/19/22 1840  NA 132*  --   K 3.2*  --   CL 89*  --   CO2 32  --   GLUCOSE 530*  --   BUN 36*  --   CREATININE 2.07*  --   CALCIUM 8.0*  --   MG  --  1.3*   GFR: CrCl cannot be calculated (Unknown ideal weight.). Liver Function Tests: Recent Labs  Lab 06/19/22 1534  AST 11*  ALT 12  ALKPHOS 127*  BILITOT 0.5  PROT 7.4  ALBUMIN 2.9*   No results for input(s): "LIPASE", "AMYLASE" in the last 168 hours. No results for input(s): "AMMONIA" in the last 168 hours. Coagulation Profile: No results for input(s): "INR", "PROTIME" in the last 168 hours. Cardiac Enzymes: No results for input(s): "CKTOTAL", "CKMB", "CKMBINDEX", "TROPONINI" in the last 168 hours. BNP (last 3 results) No results for input(s): "PROBNP" in the last 8760 hours. HbA1C: No results for input(s): "HGBA1C" in the last 72 hours. CBG: Recent Labs  Lab 06/20/22 0039  GLUCAP 575*   Lipid Profile: No results for input(s): "CHOL", "HDL", "LDLCALC", "TRIG", "CHOLHDL", "LDLDIRECT" in the last 72 hours. Thyroid Function Tests: No results for input(s): "TSH", "T4TOTAL", "FREET4", "T3FREE", "THYROIDAB" in the last 72 hours. Anemia Panel: No results for input(s): "VITAMINB12", "FOLATE", "FERRITIN", "TIBC", "IRON", "RETICCTPCT" in the last 72 hours. Urine analysis:    Component Value Date/Time   COLORURINE STRAW (A) 06/19/2022 2353   APPEARANCEUR CLEAR 06/19/2022 2353   LABSPEC 1.011 06/19/2022  2353   PHURINE 5.0 06/19/2022 2353   GLUCOSEU >=500 (A) 06/19/2022 2353   HGBUR SMALL (A) 06/19/2022 2353   BILIRUBINUR NEGATIVE 06/19/2022 2353   KETONESUR NEGATIVE 06/19/2022 2353   PROTEINUR NEGATIVE 06/19/2022 2353   NITRITE NEGATIVE 06/19/2022 2353   LEUKOCYTESUR NEGATIVE 06/19/2022 2353   Sepsis Labs: @LABRCNTIP (procalcitonin:4,lacticidven:4) )No results found for this or any previous visit (from the past 240 hour(s)).   Radiological Exams on Admission: DG Chest 2 View  Result Date: 06/19/2022 CLINICAL DATA:  Left-sided chest pain and shortness of breath EXAM: CHEST - 2 VIEW COMPARISON:  12/01/2015 FINDINGS: Heart size upper limits of normal. Mild aortic tortuosity. The lungs are clear. The pulmonary vascularity is normal. No effusions. Mild curvature and degenerative  change of the spine. IMPRESSION: No active cardiopulmonary disease. Electronically Signed   By: Paulina Fusi M.D.   On: 06/19/2022 16:07    EKG: Independently reviewed.  Normal sinus rhythm RBBB.  Assessment/Plan Principal Problem:   Acute on chronic diastolic CHF (congestive heart failure) (HCC) Active Problems:   Lower extremity edema   AKI (acute kidney injury) (HCC)   Essential hypertension   Type 2 diabetes mellitus with hyperglycemia (HCC)   Acute CHF (congestive heart failure) (HCC)    Acute on chronic diastolic CHF last EF measured in 2021 was 60 to 65%.  We will recheck 2D echo.  I have ordered 1 dose of Lasix 60 mg IV.  Based on response we will have further plans.  Closely follow metabolic panel intake output Daily weights.  Check Dopplers of the lower extremity. Diabetes mellitus type 2 uncontrolled with hyperglycemia last hemoglobin A1c in 2020 to December was 13.4.  Recheck hemoglobin A1c.  Patient takes Lantus 25 units at bedtime which I will dose along with moderate dose sliding scale coverage. Acute on chronic kidney disease stage III creatinine has worsened from 1.3 about 3 months ago it is  around 2.07 now.  Could be from recent use of indomethacin.  Will hold patient's losartan until creatinine improves.  UA does not show any proteins.  Will check renal ultrasound.  Closely follow intake output metabolic panel.  May need nephrology input if renal function does not improve. Hypertension we will continue amlodipine holding losartan due to renal failure patient is on IV Lasix 1 dose.  As needed IV hydralazine.  Continue Coreg.  Follow blood pressure trends. 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 Anemia follow CBC.  Likely from renal disease. Low albumin levels cause not clear.  Patient's urine does not have any proteins.  Not sure if patient has any liver disease. History of recent gout attack.  Had used indomethacin.  Due to worsening renal function we will hold any NSAIDs.  Check uric acid levels.   Since patient has significant peripheral edema with worsening renal function will need close monitoring and inpatient status.  DVT prophylaxis: Xarelto. Code Status: Full code. Family Communication: Discussed with patient. Disposition Plan: Home. Consults called: None. Admission status: Inpatient.   Eduard Clos MD [facility] Pager [phone].  If 7PM-7AM, please contact night-coverage [email or web address] Password [email or web address]  06/20/2022, 1:51 AM

## 2022-06-20 NOTE — Inpatient Diabetes Management (Addendum)
Inpatient Diabetes Program Recommendations  AACE/ADA: New Consensus Statement on Inpatient Glycemic Control (2015)  Target Ranges:  Prepandial:   less than 140 mg/dL      Peak postprandial:   less than 180 mg/dL (1-2 hours)      Critically ill patients:  140 - 180 mg/dL   Lab Results  Component Value Date   GLUCAP 289 (H) 06/20/2022   HGBA1C 12.7 (H) 06/20/2022    Review of Glycemic Control  Diabetes history: DM 2 Outpatient Diabetes medications: Metformin 1000 mg bid, Lantus 24 units Daily Current orders for Inpatient glycemic control:  Semglee 30 units Daily Novolog 0-15 units tid Novolog 6 units tid meal coverage  A1c 12.7% on 8/8  Inpatient Diabetes Program Recommendations:    Spoke with pt at bedside regarding A1c of 12.7% and glucose control at home. Pt reports relocating from [facility] and has not had her meter for a few months. Pt reports compliance with taking her medications. Discussed current A1c level. Discussed importance of glucose control. Pt reports she can easily obtain her meter from [facility]. Told pt to see if local PCP offices are accepting new patients for her to have a physician here. Discussed lifestyle modifications with diet and exercise. Pts significant other cooks for her a diabetic friendly diet, however, she admits to drinking regular sodas. Reviewed glucose and A1c goals. Will monitor glucose trends while here.   Thanks, Christena Deem RN, MSN, BC-ADM Inpatient Diabetes Coordinator Team Pager [phone] (8a-5p)

## 2022-06-20 NOTE — Progress Notes (Signed)
BLE venous duplex has been completed.   Results can be found under chart review under CV PROC. 06/20/2022 10:49 AM Marise Knapper RVT, RDMS

## 2022-06-21 DIAGNOSIS — I5033 Acute on chronic diastolic (congestive) heart failure: Secondary | ICD-10-CM | POA: Diagnosis not present

## 2022-06-21 LAB — GLUCOSE, CAPILLARY
Glucose-Capillary: 131 mg/dL — ABNORMAL HIGH (ref 70–99)
Glucose-Capillary: 194 mg/dL — ABNORMAL HIGH (ref 70–99)
Glucose-Capillary: 205 mg/dL — ABNORMAL HIGH (ref 70–99)
Glucose-Capillary: 304 mg/dL — ABNORMAL HIGH (ref 70–99)

## 2022-06-21 LAB — CBC
HCT: 30.2 % — ABNORMAL LOW (ref 36.0–46.0)
Hemoglobin: 9.9 g/dL — ABNORMAL LOW (ref 12.0–15.0)
MCH: 28.8 pg (ref 26.0–34.0)
MCHC: 32.8 g/dL (ref 30.0–36.0)
MCV: 87.8 fL (ref 80.0–100.0)
Platelets: 338 10*3/uL (ref 150–400)
RBC: 3.44 MIL/uL — ABNORMAL LOW (ref 3.87–5.11)
RDW: 12.6 % (ref 11.5–15.5)
WBC: 8.5 10*3/uL (ref 4.0–10.5)
nRBC: 0 % (ref 0.0–0.2)

## 2022-06-21 LAB — BASIC METABOLIC PANEL
Anion gap: 13 (ref 5–15)
BUN: 44 mg/dL — ABNORMAL HIGH (ref 6–20)
CO2: 32 mmol/L (ref 22–32)
Calcium: 7.8 mg/dL — ABNORMAL LOW (ref 8.9–10.3)
Chloride: 92 mmol/L — ABNORMAL LOW (ref 98–111)
Creatinine, Ser: 1.94 mg/dL — ABNORMAL HIGH (ref 0.44–1.00)
GFR, Estimated: 30 mL/min — ABNORMAL LOW (ref >60)
Glucose, Bld: 97 mg/dL (ref 70–99)
Potassium: 3.2 mmol/L — ABNORMAL LOW (ref 3.5–5.1)
Sodium: 137 mmol/L (ref 135–145)

## 2022-06-21 LAB — PROTIME-INR
INR: 2.2 — ABNORMAL HIGH (ref 0.8–1.2)
Prothrombin Time: 24.1 seconds — ABNORMAL HIGH (ref 11.4–15.2)

## 2022-06-21 MED ORDER — TAMSULOSIN HCL 0.4 MG PO CAPS
0.4000 mg | ORAL_CAPSULE | Freq: Every day | ORAL | Status: DC
Start: 2022-06-21 — End: 2022-06-24
  Administered 2022-06-21 – 2022-06-24 (×4): 0.4 mg via ORAL
  Filled 2022-06-21 (×4): qty 1

## 2022-06-21 MED ORDER — IPRATROPIUM-ALBUTEROL 0.5-2.5 (3) MG/3ML IN SOLN
3.0000 mL | RESPIRATORY_TRACT | Status: DC | PRN
  Administered 2022-06-22: 3 mL via RESPIRATORY_TRACT
  Filled 2022-06-21: qty 3

## 2022-06-21 MED ORDER — IPRATROPIUM-ALBUTEROL 0.5-2.5 (3) MG/3ML IN SOLN
3.0000 mL | Freq: Four times a day (QID) | RESPIRATORY_TRACT | Status: DC
Start: 2022-06-21 — End: 2022-06-21
  Administered 2022-06-21: 3 mL via RESPIRATORY_TRACT
  Filled 2022-06-21: qty 3

## 2022-06-21 MED ORDER — HYDROMORPHONE HCL 1 MG/ML IJ SOLN
1.0000 mg | Freq: Once | INTRAMUSCULAR | Status: AC
  Administered 2022-06-21: 1 mg via INTRAVENOUS
  Filled 2022-06-21: qty 1

## 2022-06-21 MED ORDER — POTASSIUM CHLORIDE CRYS ER 20 MEQ PO TBCR
40.0000 meq | EXTENDED_RELEASE_TABLET | Freq: Two times a day (BID) | ORAL | Status: AC
Start: 2022-06-21 — End: 2022-06-21
  Administered 2022-06-21 (×2): 40 meq via ORAL
  Filled 2022-06-21 (×2): qty 2

## 2022-06-21 MED ORDER — INSULIN GLARGINE-YFGN 100 UNIT/ML ~~LOC~~ SOLN
25.0000 [IU] | Freq: Every day | SUBCUTANEOUS | Status: DC
  Administered 2022-06-22 – 2022-06-24 (×3): 25 [IU] via SUBCUTANEOUS
  Filled 2022-06-21 (×3): qty 0.25

## 2022-06-21 MED ORDER — INSULIN ASPART 100 UNIT/ML IJ SOLN
5.0000 [IU] | Freq: Three times a day (TID) | INTRAMUSCULAR | Status: DC
  Administered 2022-06-21 – 2022-06-24 (×9): 5 [IU] via SUBCUTANEOUS

## 2022-06-21 NOTE — Progress Notes (Signed)
Nutrition Brief Note  RD consulted for diet education.   Reviewed chart. Patient with history of CHF and uncontrolled diabetes (HgbA1c 12.7%).   Attempted to speak with patient at bedside. She appeared sleepy at time of visit. Unable to provide detailed nutrition education. "Heart-Healthy Consistent Carbohydrate Nutrition" handout left on her side table to review once she is more awake. Will re-attempt to follow up with patient for more detailed nutrition education prior to discharge.   Wt Readings from Last 15 Encounters:  06/21/22 (!) 150.1 kg  08/19/15 (!) 139.3 kg  07/22/15 133.4 kg    Body mass index is 58.62 kg/m. Patient meets criteria for morbid obesity based on current BMI.   Current diet order is Heart Healthy/Carb modified, patient is consuming approximately 100% of meals at this time. Labs and medications reviewed.   Drusilla Kanner, RDN, LDN Clinical Nutrition

## 2022-06-21 NOTE — Discharge Instructions (Signed)

## 2022-06-21 NOTE — Progress Notes (Signed)
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 Per progression, pt lives with her boyfriend in [place]. Apparently recently moved from [place] and does not have a new PCP. TOC will follow to assist with PCP and other potential needs at DC.

## 2022-06-21 NOTE — Progress Notes (Signed)
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  Reportedly recently diagnosed with gout flare treated with indomethacin for 5 days In the ED chest x-ray was unremarkable, blood glucose was 567, BNP 103, creatinine was 2   Subjective: Urinary retention earlier this morning with pelvic discomfort, required I/O cath  Assessment and Plan:  Acute on chronic diastolic CHF -Repeat echo with EF of 50-55%, indeterminate diastolic parameters  -Patient reports compliance with torsemide however does not follow a low-sodium diet -continue IV Lasix today, will decrease dose, she is 1.1 L negative, mild bump in creatinine noted -Daily weights, BMP in a.m.   Uncontrolled type 2 diabetes mellitus with hyperglycemia -A1c was 13.4 in 2020, repeat 12.7 -CBGs improved on Lantus and meal coverage, counseled regarding compliance   AKI on CKD 3a -Baseline creatinine 1.3, worsened to 2.0 on admission likely secondary to NSAIDs and ARB use -Mild urinary retention noted, required I/O cath early this morning, repeat bladder scan, likely place Foley today   COPD, smoker -Stable no wheezing, continue DuoNebs -Smoking cessation counseled  Hypertension -Hold amlodipine, continue Coreg and diuretics   Chronic anemia -Monitor   History of renal vein thrombosis -On Xarelto, continued   Hypoalbuminemia -UA without proteinuria, could have liver disease   Morbid obesity -BMI is 58.6, lifestyle modification recommended   Suspected OSA -Has not been able to have a sleep study yet   DVT prophylaxis: Xarelto Code Status: Full code Family Communication: Discussed  with patient in detail, no family at bedside Disposition Plan: Home likely 2 to 3 days  Consultants:    Procedures:   Antimicrobials:    Objective: Vitals:   06/21/22 0546 06/21/22 0826 06/21/22 1119 06/21/22 1146  BP: 112/63 110/61  111/70  Pulse: 63   65  Resp: 20 (!) 21  17  Temp: 98.1 F (36.7 C)     TempSrc: Oral     SpO2: 99% 97% 96%   Weight:      Height:        Intake/Output Summary (Last 24 hours) at 06/21/2022 1236 Last data filed at 06/21/2022 0911 Gross per 24 hour  Intake 297 ml  Output 1850 ml  Net -1553 ml   Filed Weights   06/20/22 1110 06/21/22 0004  Weight: (!) 150.2 kg (!) 150.1 kg    Examination:  General exam: Obese chronically ill female Respiratory system: Poor air movement bilaterally Cardiovascular system: S1 & S2 heard, RRR.  Abd: nondistended, soft and nontender.Normal bowel sounds heard. Central nervous system: Alert and oriented. No focal neurological deficits. Extremities: 1+ edema Skin: No rashes Psychiatry:  Mood & affect appropriate.     Data Reviewed:   CBC: Recent Labs  Lab 06/19/22 1534 06/20/22 0147 06/21/22 0403  WBC 7.5 7.9 8.5  NEUTROABS 4.8  --   --   HGB 10.5* 10.2* 9.9*  HCT 31.4* 30.3* 30.2*  MCV 86.7 86.6 87.8  PLT 346 317 338   Basic Metabolic Panel: Recent Labs  Lab 06/19/22 1534 06/19/22 1840 06/20/22 0147 06/20/22 0509 06/21/22 0403  NA 132*  --  134* 132* 137  K 3.2*  --  2.9* 3.0* 3.2*  CL 89*  --  90* 88* 92*  CO2 32  --  30 32 32  GLUCOSE 530*  --  539* 476* 97  BUN 36*  --  39* 39* 44*  CREATININE 2.07*  --  1.81* 1.78* 1.94*  CALCIUM 8.0*  --  7.8* 8.0* 7.8*  MG  --  1.3* 1.7  --   --    GFR: Estimated Creatinine Clearance: 47.9 mL/min (A) (by C-G formula based on SCr of 1.94 mg/dL (H)). Liver Function Tests: Recent Labs  Lab 06/19/22 1534 06/20/22 0147  AST 11* 9*  ALT 12 11  ALKPHOS 127* 105  BILITOT 0.5 0.5  PROT 7.4 6.5  ALBUMIN 2.9* 2.6*   No results for input(s):  "LIPASE", "AMYLASE" in the last 168 hours. No results for input(s): "AMMONIA" in the last 168 hours. Coagulation Profile: Recent Labs  Lab 06/21/22 0403  INR 2.2*   Cardiac Enzymes: No results for input(s): "CKTOTAL", "CKMB", "CKMBINDEX", "TROPONINI" in the last 168 hours. BNP (last 3 results) No results for input(s): "PROBNP" in the last 8760 hours. HbA1C: Recent Labs    06/20/22 0147  HGBA1C 12.7*   CBG: Recent Labs  Lab 06/20/22 1126 06/20/22 1608 06/20/22 2105 06/21/22 0615 06/21/22 1134  GLUCAP 289* 170* 117* 131* 194*   Lipid Profile: No results for input(s): "CHOL", "HDL", "LDLCALC", "TRIG", "CHOLHDL", "LDLDIRECT" in the last 72 hours. Thyroid Function Tests: Recent Labs    06/20/22 0148  TSH 3.547   Anemia Panel: No results for input(s): "VITAMINB12", "FOLATE", "FERRITIN", "TIBC", "IRON", "RETICCTPCT" in the last 72 hours. Urine analysis:    Component Value Date/Time   COLORURINE STRAW (A) 06/19/2022 2353   APPEARANCEUR CLEAR 06/19/2022 2353   LABSPEC 1.011 06/19/2022 2353   PHURINE 5.0 06/19/2022 2353   GLUCOSEU >=500 (A) 06/19/2022 2353   HGBUR SMALL (A) 06/19/2022 2353   BILIRUBINUR NEGATIVE 06/19/2022 2353   KETONESUR NEGATIVE 06/19/2022 2353   PROTEINUR NEGATIVE 06/19/2022 2353   NITRITE NEGATIVE 06/19/2022 2353   LEUKOCYTESUR NEGATIVE 06/19/2022 2353   Sepsis Labs: @LABRCNTIP (procalcitonin:4,lacticidven:4)  )No results found for this or any previous visit (from the past 240 hour(s)).   Radiology Studies: VAS LOWER EXTREMITY VENOUS (DVT)  Result Date: 06/20/2022  Lower Venous DVT Study Patient Name:  Hailey Klein  Date of Exam:   06/20/2022 Medical Rec #: [id number]        Accession #:    [id number] Date of Birth: [birth year]         Patient Gender: F Patient Age:   34 years Exam Location:  [facility] Procedure:      VAS [facility] LOWER EXTREMITY VENOUS (DVT) Referring Phys: [place]  --------------------------------------------------------------------------------  Indications: Edema. Other Indications: Hx of CHF and CKD. Risk Factors: Obesity. Limitations: Body habitus and poor ultrasound/tissue interface. Comparison Study: No previous exams Performing Technologist: Jody Hill RVT, RDMS  Examination Guidelines: A complete evaluation includes B-mode imaging, spectral Doppler, color Doppler, and power Doppler as needed of all accessible portions of each vessel. Bilateral testing is considered an integral part of a complete examination. Limited examinations for reoccurring indications may be performed as noted. The reflux portion of the exam is performed with the patient in reverse Trendelenburg.  +--------+---------------+---------+-----------+----------+--------------------+ RIGHT   CompressibilityPhasicitySpontaneityPropertiesThrombus Aging       +--------+---------------+---------+-----------+----------+--------------------+ CFV     Full           Yes      Yes                                       +--------+---------------+---------+-----------+----------+--------------------+  SFJ     Full                                                              +--------+---------------+---------+-----------+----------+--------------------+ FV Prox Full           Yes      Yes                                       +--------+---------------+---------+-----------+----------+--------------------+ FV Mid  Full           Yes      Yes                                       +--------+---------------+---------+-----------+----------+--------------------+ FV      Full           Yes      Yes                                       Distal                                                                    +--------+---------------+---------+-----------+----------+--------------------+ PFV                    Yes      Yes                  patent by                                                                  color/doppler        +--------+---------------+---------+-----------+----------+--------------------+ POP     Full           Yes      Yes                                       +--------+---------------+---------+-----------+----------+--------------------+ PTV                                                  Not visualized       +--------+---------------+---------+-----------+----------+--------------------+ PERO  Not visualized       +--------+---------------+---------+-----------+----------+--------------------+   Right Technical Findings: Not visualized segments include Peroneal and posterior tibial veins.  +--------+---------------+---------+-----------+----------+--------------------+ LEFT    CompressibilityPhasicitySpontaneityPropertiesThrombus Aging       +--------+---------------+---------+-----------+----------+--------------------+ CFV     Full           Yes      Yes                                       +--------+---------------+---------+-----------+----------+--------------------+ SFJ     Full                                                              +--------+---------------+---------+-----------+----------+--------------------+ FV Prox Full           Yes      Yes                                       +--------+---------------+---------+-----------+----------+--------------------+ FV Mid  Full           Yes      Yes                                       +--------+---------------+---------+-----------+----------+--------------------+ FV      Full           Yes      Yes                                       Distal                                                                    +--------+---------------+---------+-----------+----------+--------------------+ PFV                    Yes      Yes                  patent by                                                                  color/doppler        +--------+---------------+---------+-----------+----------+--------------------+ POP     Full           Yes      Yes                                       +--------+---------------+---------+-----------+----------+--------------------+  PTV                                                  not visualized       +--------+---------------+---------+-----------+----------+--------------------+ PERO                                                 not visualized       +--------+---------------+---------+-----------+----------+--------------------+   Left Technical Findings: Not visualized segments include Peroneal and posterior tibial veins.   Summary: BILATERAL: -No evidence of popliteal cyst, bilaterally. -Subcutaneous edema, bilaterally. RIGHT: - There is no evidence of deep vein thrombosis in the lower extremity. However, portions of this examination were limited- see technologist comments above.  - Ultrasound characteristics of enlarged lymph nodes are noted in the groin.  LEFT: - There is no evidence of deep vein thrombosis in the lower extremity. However, portions of this examination were limited- see technologist comments above.  - Ultrasound characteristics of enlarged lymph nodes noted in the groin.  *See table(s) above for measurements and observations. Electronically signed by Waverly Ferrari MD on 06/20/2022 at 7:17:18 PM.    Final    ECHOCARDIOGRAM COMPLETE  Result Date: 06/20/2022    ECHOCARDIOGRAM REPORT   Patient Name:   TIAIRA ARAMBULA Date of Exam: 06/20/2022 Medical Rec #:  [id number]       Height:       63.0 in Accession #:    [id number]      Weight:       331.1 lb Date of Birth:  [birth year]        BSA:          2.396 m Patient Age:    56 years        BP:           112/66 mmHg Patient Gender: F               HR:           65 bpm. Exam Location:  Inpatient Procedure: 2D Echo, Cardiac Doppler, Color  Doppler and Intracardiac            Opacification Agent Indications:    CHF I50.9  History:        Patient has no prior history of Echocardiogram examinations.                 CHF; Risk Factors:Hypertension and Diabetes.  Sonographer:    Lucendia Herrlich Sonographer#2:  Irving Burton Senior Referring Phys: [phone] Eduard Clos IMPRESSIONS  1. Left ventricular ejection fraction, by estimation, is 50 to 55%. The left ventricle has low normal function. The left ventricle demonstrates global hypokinesis. Left ventricular diastolic parameters are indeterminate.  2. Right ventricular systolic function is normal. The right ventricular size is normal.  3. Left atrial size was moderately dilated.  4. The mitral valve is normal in structure. No evidence of mitral valve regurgitation. No evidence of mitral stenosis.  5. The aortic valve is normal in structure. There is mild calcification of the aortic valve. There is mild thickening of the aortic valve. Aortic valve regurgitation is not visualized. Aortic valve sclerosis/calcification is present, without any evidence of aortic stenosis.  6. The inferior vena cava  is normal in size with greater than 50% respiratory variability, suggesting right atrial pressure of 3 mmHg. FINDINGS  Left Ventricle: Left ventricular ejection fraction, by estimation, is 50 to 55%. The left ventricle has low normal function. The left ventricle demonstrates global hypokinesis. The left ventricular internal cavity size was normal in size. There is no left ventricular hypertrophy. Left ventricular diastolic parameters are indeterminate. Indeterminate filling pressures. Right Ventricle: The right ventricular size is normal. No increase in right ventricular wall thickness. Right ventricular systolic function is normal. Left Atrium: Left atrial size was moderately dilated. Right Atrium: Right atrial size was normal in size. Pericardium: There is no evidence of pericardial effusion. Mitral Valve: The mitral  valve is normal in structure. No evidence of mitral valve regurgitation. No evidence of mitral valve stenosis. Tricuspid Valve: The tricuspid valve is normal in structure. Tricuspid valve regurgitation is not demonstrated. No evidence of tricuspid stenosis. Aortic Valve: The aortic valve is normal in structure. There is mild calcification of the aortic valve. There is mild thickening of the aortic valve. Aortic valve regurgitation is not visualized. Aortic valve sclerosis/calcification is present, without any evidence of aortic stenosis. Pulmonic Valve: The pulmonic valve was normal in structure. Pulmonic valve regurgitation is not visualized. No evidence of pulmonic stenosis. Aorta: The aortic root is normal in size and structure. Venous: The inferior vena cava is normal in size with greater than 50% respiratory variability, suggesting right atrial pressure of 3 mmHg. IAS/Shunts: No atrial level shunt detected by color flow Doppler.  LEFT VENTRICLE PLAX 2D LVIDd:         4.70 cm   Diastology LVIDs:         3.15 cm   LV e' medial:    7.18 cm/s LV PW:         1.10 cm   LV E/e' medial:  11.1 LV IVS:        1.00 cm   LV e' lateral:   7.71 cm/s LVOT diam:     2.10 cm   LV E/e' lateral: 10.4 LV SV:         95 LV SV Index:   40 LVOT Area:     3.46 cm  RIGHT VENTRICLE RV S prime:     12.30 cm/s TAPSE (M-mode): 2.0 cm LEFT ATRIUM             Index        RIGHT ATRIUM           Index LA diam:        4.80 cm 2.00 cm/m   RA Area:     19.10 cm LA Vol (A2C):   51.8 ml 21.62 ml/m  RA Volume:   50.60 ml  21.12 ml/m LA Vol (A4C):   48.8 ml 20.37 ml/m LA Biplane Vol: 51.6 ml 21.54 ml/m  AORTIC VALVE LVOT Vmax:   117.00 cm/s LVOT Vmean:  78.900 cm/s LVOT VTI:    0.274 m  AORTA Ao Root diam: 2.60 cm Ao Asc diam:  3.20 cm MITRAL VALVE MV Area (PHT): 2.95 cm    SHUNTS MV Decel Time: 257 msec    Systemic VTI:  0.27 m MV E velocity: 79.90 cm/s  Systemic Diam: 2.10 cm MV A velocity: 71.00 cm/s MV E/A ratio:  1.13 Mihai Croitoru MD  Electronically signed by Sanda Klein MD Signature Date/Time: 06/20/2022/3:48:12 PM    Final    US RENAL  Result Date: 06/20/2022 CLINICAL DATA:  56 year old female with acute  renal failure. EXAM: RENAL / URINARY TRACT ULTRASOUND COMPLETE COMPARISON:  None Available. FINDINGS: Right Kidney: Renal measurements: 10.8 x 5.0 x 5.2 cm = volume: 145 mL. Echogenicity within normal limits. No mass or hydronephrosis visualized. Left Kidney: Renal measurements: 10.1 x 5.4 x 4.9 cm = volume: 138 mL. Echogenicity within normal limits. No mass or hydronephrosis visualized. Bladder: Appears normal for degree of bladder distention. Estimated bladder volume 351 mL. Other: None. IMPRESSION: Normal ultrasound appearance of both kidneys. Urinary bladder volume estimated at, query urinary 351 mL retention. Electronically Signed   By: Genevie Ann M.D.   On: 06/20/2022 04:50   DG Chest 2 View  Result Date: 06/19/2022 CLINICAL DATA:  Left-sided chest pain and shortness of breath EXAM: CHEST - 2 VIEW COMPARISON:  12/01/2015 FINDINGS: Heart size upper limits of normal. Mild aortic tortuosity. The lungs are clear. The pulmonary vascularity is normal. No effusions. Mild curvature and degenerative change of the spine. IMPRESSION: No active cardiopulmonary disease. Electronically Signed   By: Nelson Chimes M.D.   On: 06/19/2022 16:07     Scheduled Meds:  ARIPiprazole  20 mg Oral QHS   carvedilol  12.5 mg Oral BID WC   furosemide  60 mg Intravenous BID   insulin aspart  0-15 Units Subcutaneous TID WC   insulin aspart  5 Units Subcutaneous TID WC   [START ON 06/22/2022] insulin glargine-yfgn  25 Units Subcutaneous Daily   ipratropium-albuterol  3 mL Nebulization QID   potassium chloride  40 mEq Oral BID   pregabalin  100 mg Oral BID   rivaroxaban  20 mg Oral Q supper   Continuous Infusions:   LOS: 1 day    Time spent: 80min   Domenic Polite, MD [facility]   06/21/2022, 12:36 PM

## 2022-06-22 ENCOUNTER — Other Ambulatory Visit (HOSPITAL_COMMUNITY): Payer: Self-pay

## 2022-06-22 ENCOUNTER — Encounter (HOSPITAL_COMMUNITY): Payer: Self-pay | Admitting: Internal Medicine

## 2022-06-22 DIAGNOSIS — I10 Essential (primary) hypertension: Secondary | ICD-10-CM | POA: Diagnosis not present

## 2022-06-22 DIAGNOSIS — R600 Localized edema: Secondary | ICD-10-CM

## 2022-06-22 DIAGNOSIS — E66813 Obesity, class 3: Secondary | ICD-10-CM | POA: Diagnosis present

## 2022-06-22 DIAGNOSIS — N179 Acute kidney failure, unspecified: Secondary | ICD-10-CM | POA: Diagnosis not present

## 2022-06-22 DIAGNOSIS — I5033 Acute on chronic diastolic (congestive) heart failure: Secondary | ICD-10-CM | POA: Diagnosis not present

## 2022-06-22 LAB — CBC
HCT: 29 % — ABNORMAL LOW (ref 36.0–46.0)
Hemoglobin: 9.5 g/dL — ABNORMAL LOW (ref 12.0–15.0)
MCH: 29.1 pg (ref 26.0–34.0)
MCHC: 32.8 g/dL (ref 30.0–36.0)
MCV: 89 fL (ref 80.0–100.0)
Platelets: 295 10*3/uL (ref 150–400)
RBC: 3.26 MIL/uL — ABNORMAL LOW (ref 3.87–5.11)
RDW: 12.5 % (ref 11.5–15.5)
WBC: 9.4 10*3/uL (ref 4.0–10.5)
nRBC: 0 % (ref 0.0–0.2)

## 2022-06-22 LAB — BASIC METABOLIC PANEL
Anion gap: 10 (ref 5–15)
BUN: 45 mg/dL — ABNORMAL HIGH (ref 6–20)
CO2: 31 mmol/L (ref 22–32)
Calcium: 7.5 mg/dL — ABNORMAL LOW (ref 8.9–10.3)
Chloride: 93 mmol/L — ABNORMAL LOW (ref 98–111)
Creatinine, Ser: 1.66 mg/dL — ABNORMAL HIGH (ref 0.44–1.00)
GFR, Estimated: 36 mL/min — ABNORMAL LOW (ref >60)
Glucose, Bld: 257 mg/dL — ABNORMAL HIGH (ref 70–99)
Potassium: 4 mmol/L (ref 3.5–5.1)
Sodium: 134 mmol/L — ABNORMAL LOW (ref 135–145)

## 2022-06-22 LAB — GLUCOSE, CAPILLARY
Glucose-Capillary: 262 mg/dL — ABNORMAL HIGH (ref 70–99)
Glucose-Capillary: 291 mg/dL — ABNORMAL HIGH (ref 70–99)
Glucose-Capillary: 198 mg/dL — ABNORMAL HIGH (ref 70–99)
Glucose-Capillary: 198 mg/dL — ABNORMAL HIGH (ref 70–99)

## 2022-06-22 MED ORDER — GUAIFENESIN-DM 100-10 MG/5ML PO SYRP
5.0000 mL | ORAL_SOLUTION | ORAL | Status: DC | PRN
  Administered 2022-06-22: 5 mL via ORAL
  Filled 2022-06-22: qty 5

## 2022-06-22 MED ORDER — PREDNISONE 20 MG PO TABS
20.0000 mg | ORAL_TABLET | Freq: Once | ORAL | Status: AC
  Administered 2022-06-22: 20 mg via ORAL
  Filled 2022-06-22: qty 1

## 2022-06-22 MED ORDER — DICLOFENAC SODIUM 1 % EX GEL
2.0000 g | Freq: Four times a day (QID) | CUTANEOUS | Status: DC
Start: 2022-06-22 — End: 2022-06-24
  Administered 2022-06-22 – 2022-06-24 (×6): 2 g via TOPICAL
  Filled 2022-06-22: qty 100

## 2022-06-22 NOTE — Progress Notes (Signed)
Heart Failure Stewardship Pharmacist Progress Note   PCP: Milas Hock, [facility] PCP-Cardiologist: None    HPI:  56 yo F with PMH of CHF, CKD III, T2DM, anemia, and renal vein thrombosis.  Presented to the ED on 8/7 with LEE. She was recently diagnosed with gout attack and was placed on indomethacin for 5 days. CXR without edema. ECHO with LVEF 50-55%, RV normal.   Current HF Medications: Diuretic: furosemide 60 mg IV BID Beta Blocker: carvedilol 12.5 mg BID  Prior to admission HF Medications: Diuretic: torsemide 20 mg daily Beta blocker: carvedilol 12.5 mg BID ACE/ARB/ARNI: losartan 100 mg daily  Pertinent Lab Values: Serum creatinine 1.66, BUN 45, Potassium 4.0, Sodium 134, BNP 103.1, Magnesium 1.7, A1c 12.7   Vital Signs: Weight: 327 lbs (admission weight: 331 lbs) Blood pressure: 120/60s  Heart rate: 70s  I/O: -1.6L yesterday; net -3.4L  Medication Assistance / Insurance Benefits Check: Does the patient have prescription insurance?  Yes Type of insurance plan: [place] Medicaid  Outpatient Pharmacy:  Prior to admission outpatient pharmacy: [facility] Is the patient willing to use [facility] TOC pharmacy at discharge? Yes    Assessment: 1. Acute on chronic diastolic CHF (LVEF 50-55%), due to NICM. NYHA class III symptoms. - Continue furosemide 60 mg IV BID. Strict I/Os. Daily weights. Keep K>4 and Mag>2. Recheck magnesium tomorrow with AM labs. - Continue carvedilol 12.5 mg BID - Consider starting Jardiance 10 mg daily with HFpEF. SCr improved from 1.94>1.66. Will need to monitor for UTIs given elevated A1c and body habitus.    Plan: 1) Medication changes recommended at this time: - Recheck magnesium tomorrow with AM labs - Start Jardiance 10 mg daily  2) Patient assistance: - Has traditional Medicaid - likely will need prior authorizations completed for Entresto/SGLT2  3)  Education  - To be completed prior to discharge  Sharen Hones, PharmD, BCPS Heart Failure Geneticist, molecular Phone [phone]

## 2022-06-22 NOTE — Progress Notes (Signed)
Heart Failure Nurse Navigator Progress Note  PCP: Milas Hock, [facility] PCP-Cardiologist: None Admission Diagnosis: Bilateral leg edema, Chronic congestive heart failure, Ulnar tunnel syndrome.  Admitted from: Home  Presentation:   Franchot Mimes presented with bilateral lower extremity edema, left sided chest pain, numbness in both hands, x 3 weeks. She is short of breath at baseline, seen by her PCP 2 months ago and Toresemide was increased, patient reports taking all medication as prescribed. BP 106/70, HR 72, Neuro intact, BNP 103, Troponin negative , Magnesium 1.3, replaced. EKG sinus rhythm, CXR without edema,   Patient was educated on the sign and symptoms of heart failure, daily weights, when to call her doctor or go tot he ER, patient states she does have a scale at home. Diet and fluid restrictions ( patient states she drinks about 4 large glasses of soda a day, usually cola, Dr. Reino Kent, or Mt. Dew) Her boyfriend does the cooking,however she doesn't add extra salt to her food) Patient states she takes all her medications as prescribed, and her support person brings her to her appointments. Patient verbalized her understanding of the education and is scheduled for a HF TOC appointment on 07/04/2022 @ 11 am.   ECHO/ LVEF: 50-55% HFpEF  Clinical Course:  Past Medical History:  Diagnosis Date   Diabetes mellitus without complication (HCC)    H/O suicide attempt    cut wrists - 19-y-o   Hypertension    PTSD (post-traumatic stress disorder)      Social History   Socioeconomic History   Marital status: Single    Spouse name: Not on file   Number of children: 0   Years of education: Not on file   Highest education level: High school graduate  Occupational History   Occupation: Disabilty  Tobacco Use   Smoking status: Former   Smokeless tobacco: Not on file  Vaping Use   Vaping Use: Never used  Substance and Sexual Activity   Alcohol use: Not Currently   Drug use: Yes     Types: Marijuana    Comment: 1 month ago   Sexual activity: Not on file  Other Topics Concern   Not on file  Social History Narrative   Not on file   Social Determinants of Health   Financial Resource Strain: Low Risk  (06/22/2022)   Overall Financial Resource Strain (CARDIA)    Difficulty of Paying Living Expenses: Not very hard  Food Insecurity: No Food Insecurity (06/22/2022)   Hunger Vital Sign    Worried About Running Out of Food in the Last Year: Never true    Ran Out of Food in the Last Year: Never true  Transportation Needs: No Transportation Needs (06/22/2022)   PRAPARE - Administrator, Civil Service (Medical): No    Lack of Transportation (Non-Medical): No  Physical Activity: Not on file  Stress: Not on file  Social Connections: Not on file   Education Assessment and Provision:  Detailed education and instructions provided on heart failure disease management including the following:  Signs and symptoms of Heart Failure When to call the physician Importance of daily weights Low sodium diet Fluid restriction Medication management Anticipated future follow-up appointments  Patient education given on each of the above topics.  Patient acknowledges understanding via teach back method and acceptance of all instructions.  Education Materials:  "Living Better With Heart Failure" Booklet, HF zone tool, & Daily Weight Tracker Tool.  Patient has scale at home: yes Patient has pill box  at home: NA     High Risk Criteria for Readmission and/or Poor Patient Outcomes: Heart failure hospital admissions (last 6 months): 0  No Show rate: 0 Difficult social situation: No Demonstrates medication adherence: Yes Primary Language: English Literacy level: Reading, writing, and comprehension.   Barriers of Care:   Diet/ fluid restrictions ( drinks 4 glasses of soda per day, Cola, Dr. Reino Kent, or Mt. Dew) Daily weights, BMI 53  Considerations/Referrals:   Referral  made to Heart Failure Pharmacist Stewardship: Yes Referral made to Heart Failure CSW/NCM TOC: No Referral made to Heart & Vascular TOC clinic: Yes, 07/04/2022 @ 11 am  Items for Follow-up on DC/TOC: Diet/ fluid restrictions ( Cola, Dr. Reino Kent, [place]. Dew) Daily weights Needs cardiologist   Rhae Hammock, BSN, RN Heart Failure Nurse Navigator Secure Chat Only

## 2022-06-22 NOTE — Progress Notes (Addendum)
Progress Note   Patient: Hailey Klein XBL:[id number] DOB: 19-May-1966 DOA: 06/19/2022     2 DOS: the patient was seen and examined on 06/22/2022   Brief hospital course: Mr. Hailey Klein was admitted to the hospital with the working diagnosis of decompensated heart failure.   56 yo female with the past medical history of heart failure, chronic kidney disease, T2Dm and renal vein thrombosis who presented with lower extremity edema. Patient had a recent diagnosis of acute gout flare and was treated with 5 days of indomethacin. He developed worsening lower extremity edema that prompted him to come to the hospital. On his initial physical examination his blood pressure was 123/64, HR 70, 02 saturation 98%, lungs with no rales or rhonchi, heart with S1 and S2 present and rhythmic, abdomen not distended, and bilateral lower extremity edema up to the thighs.   Na 137, K 3,2 CL 89, bicarbonate 32, glucose 530, bun 36 cr 2,0  Mg 1,3 Urine acid 9,3  High sensitive troponin 14 and 14  Wbc 7,5 hgb 10,5 plt 346  Urine analysis SG 1,011. Glucose > 500 , negative protein   Chest radiograph with no cardiomegaly, with enlarged right pulmonary artery.   EKG 68 bpm, left axis, left anterior fascicular block, right bundle branch block, sinus rhythm with no significant ST segment or T wave changes.   Assessment and Plan: * Acute on chronic diastolic CHF (congestive heart failure) (HCC) Echocardiogram with preserved LV systolic function with EF 50 to 55%, with global hypokinesis. RV with preserved systolic function.   Urine output over last 24 hrs is 1,775 ml Systolic blood pressure 107 to 127 mmHg Her volume status has improved.   Plan to continue with carvedilol and will transition to oral furosemide.  Add SGLT 2 inh.   AKI (acute kidney injury) (HCC) Renal function is improving with serum cr at 1,66, K is 4,0 and Na 134,  Hypokalemia  Plan to transition to oral furosemide and will add SGLT 2 inh. Follow up  renal function and electrolytes in am.  Essential hypertension Blood pressure controlled, continue with carvedilol.   Lower extremity edema Patient with pain in her knees, more right than left. Positive elevation of uric acid, will add oral prednisone for 3 days and follow response.   Type 2 diabetes mellitus with hyperglycemia (HCC) Hyperglycemia,  Continue glucose cover and monitoring with insulin sliding scale for glucose cover and monitoring.   Class 3 obesity (HCC) Calculated BMI is 57,9         Subjective: Patient is having pain right knee, no chest pain, dyspnea has improved   Physical Exam: Vitals:   06/22/22 0435 06/22/22 0438 06/22/22 0818 06/22/22 1151  BP:  122/68 127/68 107/86  Pulse:  69 71 80  Resp:   20 20  Temp:  98.9 F (37.2 C) 98.2 F (36.8 C) 98.3 F (36.8 C)  TempSrc:   Oral Oral  SpO2:  93% 91% 95%  Weight: (!) 148.5 kg     Height:       Neurology awake and alert ENT with mild pallor Cardiovascular with S1 and S2 present and rhythmic with no gallops, rubs or murmurs Respiratory with no rales or wheezing Abdomen with no distention  No lower extremity edema Right knee tender to palpation  Data Reviewed:    Family Communication: no family at the bedside   Disposition: Status is: Inpatient Remains inpatient appropriate because: acute gout attack  Planned Discharge Destination: Home \  Author: York Ram  Laaibah Wartman, MD 06/22/2022 4:16 PM  For on call review [email or web address].

## 2022-06-22 NOTE — Assessment & Plan Note (Addendum)
Blood pressure controlled, continue with carvedilol.  At the time of her discharge will resume losartan and amlodipine.

## 2022-06-22 NOTE — Assessment & Plan Note (Addendum)
Hyperglycemia,  Patient was placed on insulin therapy, sliding scale and basal with good toleration.  At the time of her discharge her fasting glucose is 142 mg/dl.   Continue with statin therapy.

## 2022-06-22 NOTE — Assessment & Plan Note (Signed)
Calculated BMI is 57,9

## 2022-06-22 NOTE — Inpatient Diabetes Management (Signed)
Inpatient Diabetes Program Recommendations  AACE/ADA: New Consensus Statement on Inpatient Glycemic Control (2015)  Target Ranges:  Prepandial:   less than 140 mg/dL      Peak postprandial:   less than 180 mg/dL (1-2 hours)      Critically ill patients:  140 - 180 mg/dL   Lab Results  Component Value Date   GLUCAP 291 (H) 06/22/2022   HGBA1C 12.7 (H) 06/20/2022    Review of Glycemic Control  Latest Reference Range & Units 06/21/22 21:11 06/22/22 05:53 06/22/22 10:58  Glucose-Capillary 70 - 99 mg/dL 233 (H) 435 (H) 686 (H)  (H): Data is abnormally high  Diabetes history: DM2 Outpatient Diabetes medications: Lantus 24 QHS, Bydureon 2 mg every Tuesday, Metformin 1000 BID Current orders for Inpatient glycemic control: Novolog 0-15 units TID, Novolog 5 units TID, Semglee 25 units QAM  Inpatient Diabetes Program Recommendations:    Semglee 30 units QAM Novolog 3 units TID with meals IF consumes at least 50%  Will continue to follow while inpatient.  Thank you, Dulce Sellar, MSN, CDCES Diabetes Coordinator Inpatient Diabetes Program [phone] (team pager from 8a-5p)

## 2022-06-22 NOTE — Hospital Course (Addendum)
Hailey Klein was admitted to the hospital with the working diagnosis of decompensated heart failure.   56 yo female with the past medical history of heart failure, chronic kidney disease, T2Dm and renal vein thrombosis who presented with lower extremity edema. Patient had a recent diagnosis of acute gout flare and was treated with 5 days of indomethacin. He developed worsening lower extremity edema that prompted him to come to the hospital. On his initial physical examination his blood pressure was 123/64, HR 70, 02 saturation 98%, lungs with no rales or rhonchi, heart with S1 and S2 present and rhythmic, abdomen not distended, and bilateral lower extremity edema up to the thighs.   Na 137, K 3,2 CL 89, bicarbonate 32, glucose 530, bun 36 cr 2,0  Mg 1,3 Urine acid 9,3  High sensitive troponin 14 and 14  Wbc 7,5 hgb 10,5 plt 346  Urine analysis SG 1,011. Glucose > 500 , negative protein   Chest radiograph with no cardiomegaly, with enlarged right pulmonary artery.   EKG 68 bpm, left axis, left anterior fascicular block, right bundle branch block, sinus rhythm with no significant ST segment or T wave changes.   Patient was placed on IV furosemide with improvement in volume status.  Had right knee arthritis, possible gout, improved with one dose of oral prednisone.  Out of bed to chair, Pt and Ot Possible dc home tomorrow.

## 2022-06-22 NOTE — Plan of Care (Signed)
Nutrition Education Note  RD consulted for nutrition education regarding CHF and diabetes.  Pt reports eating 3-4 meals per day at home. Her boyfriend prepares meals for her. She recalls eating fried chicken and baked fish. She does not usually eat sides with her meats except french fries on occasion. She does not like fruit and vegetables but will eat watermelon. She denies eating many sweets. Her beverage intake includes water and regular sodas (~4c per day) as she does not like diet sodas. Patient is agreeable to switching some regular soda for flavored water.   Lab Results  Component Value Date   HGBA1C 12.7 (H) 06/20/2022     RD provided "Heart Healthy, Consistent Carbohydrate Nutrition Therapy" handout from the Academy of [employer] Dietetics. Reviewed patient's dietary recall. Provided examples on ways to decrease sodium intake in diet. Discouraged intake of processed foods and use of salt shaker. Encouraged fresh fruits and vegetables as well as whole grain sources of carbohydrates to maximize fiber intake.   RD discussed why it is important for patient to adhere to diet recommendations, and emphasized the role of fluids, foods to avoid, and importance of weighing self daily.   Discussed different food groups and their effects on blood sugar, emphasizing carbohydrate-containing foods. Provided list of carbohydrates and recommended serving sizes of common foods.  Discussed importance of controlled and consistent carbohydrate intake throughout the day. Provided examples of ways to balance meals/snacks and encouraged intake of high-fiber, whole grain complex carbohydrates. Teach back method used.  Expect limited compliance. Patient does not appear ready to make diet changes. Ongoing outpatient nutrition education may be beneficial for patient.   Body mass index is 57.99 kg/m. Pt meets criteria for morbid obesity based on current BMI.  Current diet order is Heart Healthy/Carb Modified,  patient is consuming approximately 100% of meals at this time. Labs and medications reviewed. No further nutrition interventions warranted at this time. RD contact information provided. If additional nutrition issues arise, please re-consult RD.   Drusilla Kanner, RDN, LDN Clinical Nutrition

## 2022-06-22 NOTE — Assessment & Plan Note (Addendum)
Patient with pain in her knees, more right than left. Positive elevation of uric acid,responded wll to one dose of prednisone. Plan to follow up as outpatient.

## 2022-06-22 NOTE — Assessment & Plan Note (Addendum)
Echocardiogram with preserved LV systolic function with EF 50 to 55%, with global hypokinesis. RV with preserved systolic function.   Urine output over last 24 hrs is 4,300 ml Systolic blood pressure 128 mmHg  Plan to continue with carvedilol, furosemide and empagliflozin. Holding RAS inhibition due to recovering renal failure.

## 2022-06-22 NOTE — Assessment & Plan Note (Addendum)
Hyponatremia, hypokalemia, hypomagnesemia  Renal function with serum cr at 1,35, K is 4,1 and serum bicarbonate at 27. Plan to transition to oral furosemide today, continue diuresis with empagliflozin.  Follow up renal function in am Add 2 g Mag sulfate IV.

## 2022-06-23 ENCOUNTER — Other Ambulatory Visit (HOSPITAL_COMMUNITY): Payer: Self-pay

## 2022-06-23 DIAGNOSIS — I823 Embolism and thrombosis of renal vein: Secondary | ICD-10-CM | POA: Diagnosis present

## 2022-06-23 DIAGNOSIS — Z794 Long term (current) use of insulin: Secondary | ICD-10-CM

## 2022-06-23 DIAGNOSIS — I10 Essential (primary) hypertension: Secondary | ICD-10-CM | POA: Diagnosis not present

## 2022-06-23 DIAGNOSIS — N179 Acute kidney failure, unspecified: Secondary | ICD-10-CM | POA: Diagnosis not present

## 2022-06-23 DIAGNOSIS — I5033 Acute on chronic diastolic (congestive) heart failure: Secondary | ICD-10-CM | POA: Diagnosis not present

## 2022-06-23 DIAGNOSIS — E1165 Type 2 diabetes mellitus with hyperglycemia: Secondary | ICD-10-CM

## 2022-06-23 DIAGNOSIS — R600 Localized edema: Secondary | ICD-10-CM | POA: Diagnosis not present

## 2022-06-23 LAB — BASIC METABOLIC PANEL
Anion gap: 9 (ref 5–15)
BUN: 43 mg/dL — ABNORMAL HIGH (ref 6–20)
CO2: 27 mmol/L (ref 22–32)
Calcium: 7.5 mg/dL — ABNORMAL LOW (ref 8.9–10.3)
Chloride: 96 mmol/L — ABNORMAL LOW (ref 98–111)
Creatinine, Ser: 1.35 mg/dL — ABNORMAL HIGH (ref 0.44–1.00)
GFR, Estimated: 47 mL/min — ABNORMAL LOW (ref >60)
Glucose, Bld: 382 mg/dL — ABNORMAL HIGH (ref 70–99)
Potassium: 4.1 mmol/L (ref 3.5–5.1)
Sodium: 132 mmol/L — ABNORMAL LOW (ref 135–145)

## 2022-06-23 LAB — GLUCOSE, CAPILLARY
Glucose-Capillary: 312 mg/dL — ABNORMAL HIGH (ref 70–99)
Glucose-Capillary: 269 mg/dL — ABNORMAL HIGH (ref 70–99)
Glucose-Capillary: 207 mg/dL — ABNORMAL HIGH (ref 70–99)
Glucose-Capillary: 223 mg/dL — ABNORMAL HIGH (ref 70–99)

## 2022-06-23 LAB — MAGNESIUM: Magnesium: 1.8 mg/dL (ref 1.7–2.4)

## 2022-06-23 MED ORDER — MAGNESIUM SULFATE 2 GM/50ML IV SOLN
2.0000 g | Freq: Once | INTRAVENOUS | Status: AC
  Administered 2022-06-23: 2 g via INTRAVENOUS
  Filled 2022-06-23: qty 50

## 2022-06-23 MED ORDER — INSULIN ASPART 100 UNIT/ML IJ SOLN
0.0000 [IU] | Freq: Three times a day (TID) | INTRAMUSCULAR | Status: DC
  Administered 2022-06-23: 11 [IU] via SUBCUTANEOUS
  Administered 2022-06-23: 8 [IU] via SUBCUTANEOUS
  Administered 2022-06-23: 5 [IU] via SUBCUTANEOUS
  Administered 2022-06-24: 2 [IU] via SUBCUTANEOUS

## 2022-06-23 MED ORDER — CHLORHEXIDINE GLUCONATE CLOTH 2 % EX PADS
6.0000 | MEDICATED_PAD | Freq: Every day | CUTANEOUS | Status: DC
  Administered 2022-06-23 – 2022-06-24 (×2): 6 via TOPICAL

## 2022-06-23 MED ORDER — EMPAGLIFLOZIN 10 MG PO TABS
10.0000 mg | ORAL_TABLET | Freq: Every day | ORAL | 0 refills | Status: DC
  Filled 2022-06-23: qty 30, 30d supply, fill #0

## 2022-06-23 MED ORDER — HYDROXYZINE HCL 25 MG PO TABS: 25.0000 mg | ORAL_TABLET | Freq: Four times a day (QID) | ORAL | Status: DC | PRN

## 2022-06-23 MED ORDER — EMPAGLIFLOZIN 10 MG PO TABS
10.0000 mg | ORAL_TABLET | Freq: Every day | ORAL | Status: DC
  Administered 2022-06-23 – 2022-06-24 (×2): 10 mg via ORAL
  Filled 2022-06-23 (×2): qty 1

## 2022-06-23 MED ORDER — FUROSEMIDE 40 MG PO TABS
40.0000 mg | ORAL_TABLET | Freq: Every day | ORAL | Status: DC
  Administered 2022-06-23 – 2022-06-24 (×2): 40 mg via ORAL
  Filled 2022-06-23 (×2): qty 1

## 2022-06-23 NOTE — Progress Notes (Signed)
Heart Failure Patient Advocate Encounter   Received notification from [facility] Medicaid that prior authorization for Jardiance is required.   PA submitted on [employer] Confirmation #: [id number] W Status is pending  Sharen Hones, PharmD, BCPS Heart Failure Stewardship Pharmacist Phone [phone]  Please check [email or web address] for unit-specific pharmacist phone numbers

## 2022-06-23 NOTE — Assessment & Plan Note (Signed)
Continue anticoagulation with rivaroxaban,

## 2022-06-23 NOTE — TOC Initial Note (Addendum)
Transition of Care [facility]) - Initial/Assessment Note    Patient Details  Name: Hailey Klein MRN: [id number] Date of Birth: 08-07-1966  [facility]) CM/SW Contact:    Leone Haven, RN Phone Number: 06/23/2022, 4:54 PM  Clinical Narrative:                 NCM spoke with patient, she states she will be living in [place] now with her boyfriend and his Uncle.  She would like to get a PCP in [place].  Here with CHF, she has neuropathy in her legs per Staff RN in progression. Await pt eval.   Patient has follow [facility]. TOC following.   Expected Discharge Plan: Home/Self Care Barriers to Discharge: Continued Medical Work up   Patient Goals and CMS Choice Patient states their goals for this hospitalization and ongoing recovery are:: return home with boyfriend in [place]   Choice offered to / list presented to : NA  Expected Discharge Plan and Services Expected Discharge Plan: Home/Self Care In-house Referral: NA Discharge Planning Services: CM Consult Post Acute Care Choice: NA Living arrangements for the past 2 months: Single Family Home                   DME Agency: NA       HH Arranged: NA          Prior Living Arrangements/Services Living arrangements for the past 2 months: Single Family Home Lives with:: Significant Other Patient language and need for interpreter reviewed:: Yes Do you feel safe going back to the place where you live?: Yes      Need for Family Participation in Patient Care: No (Comment) Care giver support system in place?: No (comment)   Criminal Activity/Legal Involvement Pertinent to Current Situation/Hospitalization: No - Comment as needed  Activities of Daily Living Home Assistive Devices/Equipment: Cane (specify quad or straight), Walker (specify type), Shower chair with back ADL Screening (condition at time of admission) Patient's cognitive ability adequate to safely complete daily  activities?: Yes Is the patient deaf or have difficulty hearing?: No Does the patient have difficulty seeing, even when wearing glasses/contacts?: No Does the patient have difficulty concentrating, remembering, or making decisions?: No Patient able to express need for assistance with ADLs?: Yes Does the patient have difficulty dressing or bathing?: No Independently performs ADLs?: No Does the patient have difficulty walking or climbing stairs?: Yes Weakness of Legs: Both Weakness of Arms/Hands: None  Permission Sought/Granted                  Emotional Assessment   Attitude/Demeanor/Rapport: Engaged Affect (typically observed): Appropriate Orientation: : Oriented to Self, Oriented to Place, Oriented to  Time, Oriented to Situation   Psych Involvement: No (comment)  Admission diagnosis:  Lower extremity edema [R60.0] Peripheral edema [R60.9] Acute CHF (congestive heart failure) (HCC) [I50.9] AKI (acute kidney injury) (HCC) [N17.9] Hyperglycemia due to diabetes mellitus (HCC) [E11.65] Patient Active Problem List   Diagnosis Date Noted   Renal vein thrombosis (HCC) 06/23/2022   Class 3 obesity (HCC) 06/22/2022   Acute on chronic diastolic CHF (congestive heart failure) (HCC) 06/20/2022   AKI (acute kidney injury) (HCC) 06/20/2022   Essential hypertension 06/20/2022   Type 2 diabetes mellitus with hyperglycemia (HCC) 06/20/2022   Lower extremity edema 06/19/2022   Alcohol use disorder, moderate, in sustained remission (HCC) 12/02/2015   Cocaine use disorder, moderate, in sustained remission (HCC) 12/02/2015   Opioid use disorder, moderate,  in sustained remission (HCC) 12/02/2015   Cannabis use disorder, mild, abuse 12/02/2015   PTSD (post-traumatic stress disorder) 12/01/2015   MDD (major depressive disorder), recurrent, severe, with psychosis (HCC) 12/01/2015   PCP:  [facility]:   [facility] [address] [place]  [place] [address] Phone: [phone] Fax: [phone]  [id number] [place], [place] - [address] AT [facility] [address] [place] [address] Phone: [phone] Fax: [phone]  [facility] [address] [place] [address] Phone: [phone] Fax: [phone]     Social Determinants of Health (SDOH) Interventions Food Insecurity Interventions: Intervention Not Indicated Financial Strain Interventions: Intervention Not Indicated Housing Interventions: Intervention Not Indicated Transportation Interventions: Intervention Not Indicated  Readmission Risk Interventions    06/23/2022    4:53 PM  Readmission Risk Prevention Plan  Transportation Screening Complete  PCP or Specialist Appt within 3-5 Days Complete  HRI or Home Care Consult Complete  Social Work Consult for Recovery Care Planning/Counseling Complete  Palliative Care Screening Not Applicable  Medication Review Oceanographer) Complete

## 2022-06-23 NOTE — Progress Notes (Signed)
Heart Failure Stewardship Pharmacist Progress Note   PCP: Milas Hock, [facility] PCP-Cardiologist: None    HPI:  56 yo F with PMH of CHF, CKD III, T2DM, anemia, and renal vein thrombosis.  Presented to the ED on 8/7 with LEE. She was recently diagnosed with gout attack and was placed on indomethacin for 5 days. CXR without edema. ECHO with LVEF 50-55%, RV normal.   Current HF Medications: Beta Blocker: carvedilol 12.5 mg BID  Prior to admission HF Medications: Diuretic: torsemide 20 mg daily Beta blocker: carvedilol 12.5 mg BID ACE/ARB/ARNI: losartan 100 mg daily  Pertinent Lab Values: Serum creatinine 1.35, BUN 43, Potassium 4.1, Sodium 132, BNP 103.1, Magnesium 1.8, A1c 12.7   Vital Signs: Weight: 336 lbs (admission weight: 331 lbs) Blood pressure: 130/60s  Heart rate: 60-70s  I/O: -2.9L yesterday; net -5.1L  Medication Assistance / Insurance Benefits Check: Does the patient have prescription insurance?  Yes Type of insurance plan: [place] Medicaid  Outpatient Pharmacy:  Prior to admission outpatient pharmacy: [facility] Is the patient willing to use [facility] TOC pharmacy at discharge? Yes    Assessment: 1. Acute on chronic diastolic CHF (LVEF 50-55%), due to NICM. NYHA class III symptoms. - Off IV lasix. Consider starting torsemide 40 mg daily. Strict I/Os. Daily weights. Keep K>4 and Mag>2. Still needs magnesium replacement - recommend 2 g IV x 1 - Continue carvedilol 12.5 mg BID - Consider starting Jardiance 10 mg daily with HFpEF. SCr improved further today. Will need to monitor for UTIs given elevated A1c and body habitus.    Plan: 1) Medication changes recommended at this time: - Start torsemide 40 mg daily OR - Start torsemide 20 mg daily + Jardiance 10 mg daily - Magnesium 2g IV x 1  2) Patient assistance: - Has traditional Medicaid - likely will need prior authorizations completed for Entresto/SGLT2  3)  Education  - Patient has been educated on current HF medications  and potential additions to HF medication regimen - Patient verbalizes understanding that over the next few months, these medication doses may change and more medications may be added to optimize HF regimen - Patient has been educated on basic disease state pathophysiology and goals of therapy   Sharen Hones, PharmD, BCPS Heart Failure Stewardship Pharmacist Phone [phone]

## 2022-06-23 NOTE — Inpatient Diabetes Management (Signed)
Inpatient Diabetes Program Recommendations  AACE/ADA: New Consensus Statement on Inpatient Glycemic Control (2015)  Target Ranges:  Prepandial:   less than 140 mg/dL      Peak postprandial:   less than 180 mg/dL (1-2 hours)      Critically ill patients:  140 - 180 mg/dL   Lab Results  Component Value Date   GLUCAP 312 (H) 06/23/2022   HGBA1C 12.7 (H) 06/20/2022    Review of Glycemic Control  Latest Reference Range & Units 06/22/22 05:53 06/22/22 10:58 06/22/22 16:28 06/22/22 20:45 06/23/22 06:05  Glucose-Capillary 70 - 99 mg/dL 704 (H) 888 (H) 916 (H) 198 (H) 312 (H)  (H): Data is abnormally high  Diabetes history: DM2 Outpatient Diabetes medications: Lantus 24 QHS, Bydureon 2 mg every Tuesday, Metformin 1000 BID Current orders for Inpatient glycemic control: Novolog 0-15 units TID, Novolog 5 units TID, Semglee 25 units QAM   Inpatient Diabetes Program Recommendations:     Semglee 30 units QAM Novolog 3 units TID with meals IF consumes at least 50%   Will continue to follow while inpatient.  Thank you, Dulce Sellar, MSN, CDCES Diabetes Coordinator Inpatient Diabetes Program [phone] (team pager from 8a-5p)

## 2022-06-23 NOTE — Progress Notes (Signed)
Mobility Specialist Progress Note:   06/23/22 1206  Mobility  Activity Ambulated with assistance in room;Transferred from bed to chair  Level of Assistance Contact guard assist, steadying assist  Assistive Device Front wheel walker  Distance Ambulated (ft) 4 ft  Activity Response Tolerated well  $Mobility charge 1 Mobility   Pt received in bed willing to participate in mobility. No complaints of pain. Left in chair with call bell in reach and all needs met.   [facility] Jullian Previti Mobility Specialist

## 2022-06-23 NOTE — Evaluation (Signed)
Physical Therapy Evaluation Patient Details Name: Hailey Klein MRN: [id number] DOB: 1966/03/13 Today's Date: 06/23/2022  History of Present Illness  56 y.o. 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  Clinical Impression  Pt presents to PT with deficits in strength and activity tolerance. Pt tolerates therapy well today, ambulating limited community distances with an AD. Pt reports no dizziness or SOB during ambulation, but states she is pretty fatigued at end of session. Continued therapy will assist pt in improving strength and activity tolerance for progression of functional independence and to maximize safety with mobility. Further therapy would also benefit the pt for stair training.        Recommendations for follow up therapy are one component of a multi-disciplinary discharge planning process, led by the attending physician.  Recommendations may be updated based on patient status, additional functional criteria and insurance authorization.  Follow Up Recommendations Outpatient PT      Assistance Recommended at Discharge PRN  Patient can return home with the following  A little help with walking and/or transfers;A little help with bathing/dressing/bathroom;Assistance with cooking/housework;Assist for transportation;Help with stairs or ramp for entrance    Equipment Recommendations None recommended by PT  Recommendations for Other Services       Functional Status Assessment Patient has had a recent decline in their functional status and demonstrates the ability to make significant improvements in function in a reasonable and predictable amount of time.     Precautions / Restrictions Precautions Precautions: Fall Precaution Comments: watch O2 Restrictions Weight  Bearing Restrictions: No      Mobility  Bed Mobility                    Transfers Overall transfer level: Needs assistance Equipment used: Rolling walker (2 wheels) Transfers: Sit to/from Stand Sit to Stand: Min guard                Ambulation/Gait Ambulation/Gait assistance: Min guard Gait Distance (Feet): 300 Feet Assistive device: Rolling walker (2 wheels) Gait Pattern/deviations: Step-through pattern, Decreased stride length Gait velocity: decreased Gait velocity interpretation: <1.8 ft/sec, indicate of risk for recurrent falls   General Gait Details: Pt with slowed, step-through gait. Takes intermittent standing rest breaks c/o B knee pain. Pt reports no symptoms during ambulation  Stairs            Wheelchair Mobility    Modified Rankin (Stroke Patients Only)       Balance Overall balance assessment: Needs assistance Sitting-balance support: No upper extremity supported, Feet supported Sitting balance-Leahy Scale: Good     Standing balance support: Bilateral upper extremity supported, During functional activity, Reliant on assistive device for balance Standing balance-Leahy Scale: Poor                               Pertinent Vitals/Pain Pain Assessment Pain Assessment: Faces Faces Pain Scale: Hurts a little bit Pain Location: B knees Pain Descriptors / Indicators: Discomfort Pain Intervention(s): Monitored during session    Home Living Family/patient expects to be discharged to:: Private residence Living Arrangements: Spouse/significant other Available Help at Discharge: Family;Available PRN/intermittently Type of Home: Apartment Home Access: Stairs to enter Entrance Stairs-Rails: Right;Left;Can reach both Entrance Stairs-Number of Steps: 2   Home Layout:  One level Home Equipment: Agricultural consultant (2 wheels);BSC/3in1 Additional Comments: Moving into an apartment with her bf and his grandfather    Prior Function Prior  Level of Function : Needs assist       Physical Assist : ADLs (physical)   ADLs (physical): IADLs (Reports no assist with bathing/dressing however OT reports that she does need assist with these - confirm with patient) Mobility Comments: Uses RW at baseline - household distances       Hand Dominance   Dominant Hand: Right    Extremity/Trunk Assessment   Upper Extremity Assessment Upper Extremity Assessment: Overall WFL for tasks assessed    Lower Extremity Assessment Lower Extremity Assessment: Generalized weakness    Cervical / Trunk Assessment Cervical / Trunk Assessment: Normal  Communication   Communication: No difficulties  Cognition Arousal/Alertness: Awake/alert Behavior During Therapy: WFL for tasks assessed/performed Overall Cognitive Status: Within Functional Limits for tasks assessed                                          General Comments General comments (skin integrity, edema, etc.): VSS on RA; pt notes fatigue at end of session    Exercises     Assessment/Plan    PT Assessment Patient needs continued PT services  PT Problem List Decreased strength;Decreased activity tolerance;Decreased mobility;Cardiopulmonary status limiting activity       PT Treatment Interventions DME instruction;Gait training;Stair training;Functional mobility training;Therapeutic activities;Therapeutic exercise;Balance training;Patient/family education    PT Goals (Current goals can be found in the Care Plan section)  Acute Rehab PT Goals Patient Stated Goal: Get stronger PT Goal Formulation: With patient Time For Goal Achievement: 07/07/22 Potential to Achieve Goals: Good    Frequency Min 3X/week     Co-evaluation               AM-PAC PT "6 Clicks" Mobility  Outcome Measure Help needed turning from your back to your side while in a flat bed without using bedrails?: None Help needed moving from lying on your back to sitting on the side of a  flat bed without using bedrails?: None Help needed moving to and from a bed to a chair (including a wheelchair)?: A Little Help needed standing up from a chair using your arms (e.g., wheelchair or bedside chair)?: A Little Help needed to walk in hospital room?: A Little Help needed climbing 3-5 steps with a railing? : A Little 6 Click Score: 20    End of Session Equipment Utilized During Treatment: Gait belt Activity Tolerance: Patient tolerated treatment well Patient left: in chair;with call bell/phone within reach;with nursing/sitter in room Nurse Communication: Mobility status PT Visit Diagnosis: Muscle weakness (generalized) (M62.81);Other abnormalities of gait and mobility (R26.89);Pain Pain - part of body: Knee (B Knees)    Time: 3382-5053 PT Time Calculation (min) (ACUTE ONLY): 30 min   Charges:   PT Evaluation $PT Eval Low Complexity: 1 Low          Murlean Hark, SPT Acute Rehabilitation Office #: [phone]   Murlean Hark 06/23/2022, 5:32 PM

## 2022-06-23 NOTE — Progress Notes (Addendum)
Progress Note   Patient: Hailey Klein ZOX:[id number] DOB: [birth year] DOA: 06/19/2022     3 DOS: the patient was seen and examined on 06/23/2022   Brief hospital course: Mr. Imhoff was admitted to the hospital with the working diagnosis of decompensated heart failure.   56 yo female with the past medical history of heart failure, chronic kidney disease, T2Dm and renal vein thrombosis who presented with lower extremity edema. Patient had a recent diagnosis of acute gout flare and was treated with 5 days of indomethacin. He developed worsening lower extremity edema that prompted him to come to the hospital. On his initial physical examination his blood pressure was 123/64, HR 70, 02 saturation 98%, lungs with no rales or rhonchi, heart with S1 and S2 present and rhythmic, abdomen not distended, and bilateral lower extremity edema up to the thighs.   Na 137, K 3,2 CL 89, bicarbonate 32, glucose 530, bun 36 cr 2,0  Mg 1,3 Urine acid 9,3  High sensitive troponin 14 and 14  Wbc 7,5 hgb 10,5 plt 346  Urine analysis SG 1,011. Glucose > 500 , negative protein   Chest radiograph with no cardiomegaly, with enlarged right pulmonary artery.   EKG 68 bpm, left axis, left anterior fascicular block, right bundle branch block, sinus rhythm with no significant ST segment or T wave changes.   Patient was placed on IV furosemide with improvement in volume status.  Had right knee arthritis, possible gout, improved with one dose of oral prednisone.  Out of bed to chair, Pt and Ot Possible dc home tomorrow.   Assessment and Plan: * Acute on chronic diastolic CHF (congestive heart failure) (HCC) Echocardiogram with preserved LV systolic function with EF 50 to 55%, with global hypokinesis. RV with preserved systolic function.   Urine output over last 24 hrs is 4,300 ml Systolic blood pressure 128 mmHg  Plan to continue with carvedilol, furosemide and empagliflozin. Holding RAS inhibition due to recovering  renal failure.    AKI (acute kidney injury) (HCC) Hyponatremia, hypokalemia, hypomagnesemia  Renal function with serum cr at 1,35, K is 4,1 and serum bicarbonate at 27. Plan to transition to oral furosemide today, continue diuresis with empagliflozin.  Follow up renal function in am Add 2 g Mag sulfate IV.   Essential hypertension Blood pressure controlled, continue with carvedilol.   Lower extremity edema Patient with pain in her knees, more right than left. Positive elevation of uric acid, will add oral prednisone x 1 dose and continue local diclofenac.   Type 2 diabetes mellitus with hyperglycemia (HCC) Hyperglycemia,  Continue glucose cover and monitoring with insulin sliding scale for glucose cover and monitoring.  Basal insulin 25 units and pre meal short acting insulin 5 units.  Fasting glucose this am was 382 mg/dl (had one dose of prednisone yesterday, steroid induced hyperglycemia).    Renal vein thrombosis (HCC) Continue anticoagulation with rivaroxaban,   Class 3 obesity (HCC) Calculated BMI is 57,9         Subjective: patient with improvement in dyspnea and right knee pain, this am is out of bed to the chair.   Physical Exam: Vitals:   06/22/22 1730 06/22/22 2031 06/23/22 0415 06/23/22 0842  BP: (!) 104/91 118/66 136/74 131/66  Pulse: 83 75 68 67  Resp: 19 20 (!) 25 19  Temp: 98 F (36.7 C) 99.4 F (37.4 C) 99.4 F (37.4 C) 97.9 F (36.6 C)  TempSrc: Oral Oral Oral Oral  SpO2: 95% 94% 94% 99%  Weight:   Marland Kitchen)  152.8 kg   Height:       Neurology awake and alert ENT with no pallor Cardiovascular with S1 and S2 present and rhythmic with no gallops Respiratory with no rales or wheezing Abdomen not distended  No lower extremity edema  Data Reviewed:    Family Communication: no family at the bedside   Disposition: Status is: Inpatient Remains inpatient appropriate because: heart failure   Planned Discharge Destination: Home in 24 hrs if  continue to improve.       Author: Coralie Keens, MD 06/23/2022 12:49 PM  For on call review [email or web address].

## 2022-06-23 NOTE — Evaluation (Signed)
Occupational Therapy Evaluation Patient Details Name: Hailey Klein MRN: [id number] DOB: Sep 01, 1966 Today's Date: 06/23/2022   History of Present Illness 56 y.o. 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   Clinical Impression   Patient admitted for the diagnosis above.  PTA she lives with her boyfriend, who assist with ADL, iADL and community mobility.  She walks with a RW at baseline, does not use O2 at home, and admits to fatigue with mobility at baseline.  Currently she is needing generalized Min A for basis mobility and upper body ADL.  OT will follow in the acute setting, and no post acute OT is anticipated if she progresses as expected.        Recommendations for follow up therapy are one component of a multi-disciplinary discharge planning process, led by the attending physician.  Recommendations may be updated based on patient status, additional functional criteria and insurance authorization.   Follow Up Recommendations  No OT follow up    Assistance Recommended at Discharge Intermittent Supervision/Assistance  Patient can return home with the following Assist for transportation;Assistance with cooking/housework;A little help with bathing/dressing/bathroom    Functional Status Assessment  Patient has had a recent decline in their functional status and demonstrates the ability to make significant improvements in function in a reasonable and predictable amount of time.  Equipment Recommendations  None recommended by OT    Recommendations for Other Services       Precautions / Restrictions Precautions Precautions: Fall Precaution Comments: watch O2 Restrictions Weight Bearing Restrictions: No      Mobility Bed Mobility               General bed mobility  comments: up in recliner upon entering Patient Response: Cooperative  Transfers Overall transfer level: Needs assistance Equipment used: Rolling walker (2 wheels) Transfers: Sit to/from Stand, Bed to chair/wheelchair/BSC Sit to Stand: Min assist, From elevated surface Stand pivot transfers: Min assist         General transfer comment: increased effort and SOB noted      Balance Overall balance assessment: Needs assistance Sitting-balance support: Feet supported Sitting balance-Leahy Scale: Good     Standing balance support: Reliant on assistive device for balance Standing balance-Leahy Scale: Poor                             ADL either performed or assessed with clinical judgement   ADL       Grooming: Wash/dry hands;Wash/dry face;Set up;Sitting   Upper Body Bathing: Minimal assistance;Sitting       Upper Body Dressing : Minimal assistance;Sitting       Toilet Transfer: Minimal assistance;BSC/3in1;Stand-pivot                   Vision Patient Visual Report: No change from baseline       Perception Perception Perception: Within Functional Limits   Praxis Praxis Praxis: Intact    Pertinent Vitals/Pain Pain Assessment Pain Assessment: Faces Faces Pain Scale: Hurts a little bit Pain Location: lower legs Pain Descriptors / Indicators: Aching Pain Intervention(s): Monitored during session     Hand Dominance Right   Extremity/Trunk Assessment Upper Extremity Assessment Upper Extremity Assessment: Overall WFL for tasks assessed   Lower Extremity Assessment Lower Extremity Assessment:  Defer to PT evaluation   Cervical / Trunk Assessment Cervical / Trunk Assessment: Normal   Communication Communication Communication: No difficulties   Cognition Arousal/Alertness: Awake/alert Behavior During Therapy: WFL for tasks assessed/performed Overall Cognitive Status: Within Functional Limits for tasks assessed                                        General Comments   HR to 87 with mobility    Exercises     Shoulder Instructions      Home Living Family/patient expects to be discharged to:: Private residence Living Arrangements: Spouse/significant other Available Help at Discharge: Family;Available PRN/intermittently Type of Home: Apartment Home Access: Level entry     Home Layout: One level     Bathroom Shower/Tub: Tub/shower unit;Sponge bathes at baseline   Bathroom Toilet: Standard Bathroom Accessibility: Yes How Accessible: Accessible via walker Home Equipment: Rolling Walker (2 wheels)          Prior Functioning/Environment Prior Level of Function : Needs assist       Physical Assist : ADLs (physical)   ADLs (physical): Bathing;Dressing;IADLs Mobility Comments: Uses RW at baseline. ADLs Comments: Patient's boyfriend assists with lower body ADL, meal prep, home management and community mobility.        OT Problem List: Decreased activity tolerance;Decreased strength;Impaired balance (sitting and/or standing)      OT Treatment/Interventions: Self-care/ADL training;Therapeutic exercise;Therapeutic activities;Balance training;Patient/family education    OT Goals(Current goals can be found in the care plan section) Acute Rehab OT Goals Patient Stated Goal: return home OT Goal Formulation: With patient Time For Goal Achievement: 07/07/22 Potential to Achieve Goals: Good ADL Goals Pt Will Perform Grooming: with set-up;standing Pt Will Perform Upper Body Bathing: with set-up;sitting Pt Will Perform Upper Body Dressing: with set-up;sitting Pt Will Transfer to Toilet: with modified independence;ambulating;regular height toilet Pt/caregiver will Perform Home Exercise Program: Increased strength;Both right and left upper extremity;With theraband;With Supervision  OT Frequency: Min 2X/week    Co-evaluation              AM-PAC OT "6 Clicks" Daily Activity     Outcome Measure  Help from another person eating meals?: None Help from another person taking care of personal grooming?: None Help from another person toileting, which includes using toliet, bedpan, or urinal?: A Little Help from another person bathing (including washing, rinsing, drying)?: A Little Help from another person to put on and taking off regular upper body clothing?: A Little Help from another person to put on and taking off regular lower body clothing?: A Little 6 Click Score: 20   End of Session Equipment Utilized During Treatment: Rolling walker (2 wheels);Oxygen Nurse Communication: Mobility status  Activity Tolerance: Patient limited by fatigue Patient left: in chair;with call bell/phone within reach  OT Visit Diagnosis: Unsteadiness on feet (R26.81);Muscle weakness (generalized) (M62.81)                Time: 1751-0258 OT Time Calculation (min): 21 min Charges:  OT General Charges $OT Visit: 1 Visit OT Evaluation $OT Eval Moderate Complexity: 1 Mod  06/23/2022  RP, OTR/L  Acute Rehabilitation Services  Office:  [phone]   Suzanna Obey 06/23/2022, 12:43 PM

## 2022-06-24 DIAGNOSIS — M10261 Drug-induced gout, right knee: Secondary | ICD-10-CM

## 2022-06-24 DIAGNOSIS — N179 Acute kidney failure, unspecified: Secondary | ICD-10-CM | POA: Diagnosis not present

## 2022-06-24 DIAGNOSIS — I10 Essential (primary) hypertension: Secondary | ICD-10-CM | POA: Diagnosis not present

## 2022-06-24 DIAGNOSIS — I5033 Acute on chronic diastolic (congestive) heart failure: Secondary | ICD-10-CM | POA: Diagnosis not present

## 2022-06-24 LAB — GLUCOSE, CAPILLARY
Glucose-Capillary: 148 mg/dL — ABNORMAL HIGH (ref 70–99)
Glucose-Capillary: 248 mg/dL — ABNORMAL HIGH (ref 70–99)

## 2022-06-24 LAB — BASIC METABOLIC PANEL
Anion gap: 10 (ref 5–15)
BUN: 35 mg/dL — ABNORMAL HIGH (ref 6–20)
CO2: 29 mmol/L (ref 22–32)
Calcium: 8 mg/dL — ABNORMAL LOW (ref 8.9–10.3)
Chloride: 99 mmol/L (ref 98–111)
Creatinine, Ser: 1.32 mg/dL — ABNORMAL HIGH (ref 0.44–1.00)
GFR, Estimated: 48 mL/min — ABNORMAL LOW (ref >60)
Glucose, Bld: 142 mg/dL — ABNORMAL HIGH (ref 70–99)
Potassium: 3.8 mmol/L (ref 3.5–5.1)
Sodium: 138 mmol/L (ref 135–145)

## 2022-06-24 MED ORDER — TORSEMIDE 20 MG PO TABS: 40.0000 mg | ORAL_TABLET | Freq: Every day | ORAL | 0 refills | Status: DC

## 2022-06-24 NOTE — Care Management (Signed)
Met with patient at bedside. Discussed recs for OP PT, agreeable to referral through [facility]. Referral placed to [facility]. Patient aware that they will call her in about a week to schedule

## 2022-06-24 NOTE — Discharge Summary (Signed)
Physician Discharge Summary   Patient: Hailey Klein MRN: [id number] DOB: [birth year]  Admit date:     06/19/2022  Discharge date: 06/24/22  Discharge Physician: Jimmy Picket Elleigh Cassetta   PCP: [place], [place]   Recommendations at discharge:    Patient has been placed on SGLT 2 inh with good toleration Continue diuresis with torsemide 40 mg daily, with instructions to increase to bid in case of volume overload, weight increase 2 to 3 lbs in 24 hrs or 5 lbs in 7 days.  Resume ARB at the time of discharge Follow up renal function and electrolytes in 7 days as outpatient.   Discharge Diagnoses: Principal Problem:   Acute on chronic diastolic CHF (congestive heart failure) (HCC) Active Problems:   AKI (acute kidney injury) ([place])   Essential hypertension   Acute gout   Type 2 diabetes mellitus with hyperglycemia (HCC)   Class 3 obesity (HCC)   Renal vein thrombosis (HCC)  Resolved Problems:   * No resolved hospital problems. [facility] Course: Mr. Wozny was admitted to the hospital with the working diagnosis of decompensated heart failure.   56 yo female with the past medical history of heart failure, chronic kidney disease, T2Dm and renal vein thrombosis who presented with lower extremity edema. Patient had a recent diagnosis of acute gout flare and was treated with 5 days of indomethacin. He developed worsening lower extremity edema that prompted him to come to the hospital. On his initial physical examination his blood pressure was 123/64, HR 70, 02 saturation 98%, lungs with no rales or rhonchi, heart with S1 and S2 present and rhythmic, abdomen not distended, and bilateral lower extremity edema up to the thighs.   Na 137, K 3,2 CL 89, bicarbonate 32, glucose 530, bun 36 cr 2,0  Mg 1,3 Urine acid 9,3  High sensitive troponin 14 and 14  Wbc 7,5 hgb 10,5 plt 346  Urine analysis SG 1,011. Glucose > 500 , negative protein   Chest radiograph with no cardiomegaly, with enlarged  right pulmonary artery.   EKG 68 bpm, left axis, left anterior fascicular block, right bundle branch block, sinus rhythm with no significant ST segment or T wave changes.   Patient was placed on IV furosemide with improvement in volume status.  Had right knee arthritis, possible gout, improved with one dose of oral prednisone.   Patient is being discharge home to continue torsemide 40 mg daily and added SGLT 2 inh Follow up as outpatient.   Assessment and Plan: * Acute on chronic diastolic CHF (congestive heart failure) (HCC) Echocardiogram with preserved LV systolic function with EF 50 to 55%, with global hypokinesis. RV with preserved systolic function.   Patient was placed on VI furosemide for diuresis, negative fluid balance was achieved, -5,598 ml, since admission with significant improvement in her symptoms.   Patient will resume diuresis with torsemide 40 mg daily and instructions to increase dose to bid in case of volume overload.  Added SGLT 2 inh, (she had a urine infection this year but this is not common for her).  Resume losartan and blood pressure control with amlodipine.  B blocker with carvedilol.  Follow up as outpatient.    AKI (acute kidney injury) ([place]) CKD stage 3a Hyponatremia, hypokalemia, hypomagnesemia  Patient tolerated well diuresis, a the time of her discharge her renal function is at her baseline and her volume status has improved.   Electrolytes were corrected.   At the time of her discharge her serum cr is  1,32, with K at 3,8 and serum bicarbonate at 29. Patient will continue diuretic therapy with torsemide and added SGLT 2 inh.  Follow up renal function in 7 days as outpatient, continue with Kcl supplementation.     Essential hypertension Blood pressure controlled, continue with carvedilol.  At the time of her discharge will resume losartan and amlodipine.   Acute gout Patient with pain in her knees, more right than left. Positive elevation of  uric acid,responded wll to one dose of prednisone. Plan to follow up as outpatient.   Type 2 diabetes mellitus with hyperlipidemia (HCC) Hyperglycemia,  Patient was placed on insulin therapy, sliding scale and basal with good toleration.  At the time of her discharge her fasting glucose is 142 mg/dl.   Continue with statin therapy.   Renal vein thrombosis (HCC) Continue anticoagulation with rivaroxaban,   Class 3 obesity (HCC) Calculated BMI is 57,9          Consultants: none  Procedures performed: none   Disposition: Home Diet recommendation:  Cardiac and Carb modified diet DISCHARGE MEDICATION: Allergies as of 06/24/2022       Reactions   Aspirin Rash   Low doses ago        Medication List     STOP taking these medications    indomethacin 25 MG capsule Commonly known as: INDOCIN       TAKE these medications    acetaminophen 325 MG tablet Commonly known as: TYLENOL Take 2 tablets (650 mg total) by mouth every 6 (six) hours as needed for mild pain.   amLODipine 10 MG tablet Commonly known as: NORVASC Take 10 mg by mouth daily.   ARIPiprazole 20 MG tablet Commonly known as: ABILIFY Take 1 tablet (20 mg total) by mouth at bedtime. For mood control   aspirin EC 81 MG tablet Take 81 mg by mouth daily. Swallow whole.   atorvastatin 80 MG tablet Commonly known as: LIPITOR Take 80 mg by mouth daily.   Bydureon BCise 2 MG/0.85ML Auij Generic drug: Exenatide ER Inject 2 mg into the skin every Tuesday.   carvedilol 12.5 MG tablet Commonly known as: COREG Take 12.5 mg by mouth 2 (two) times daily with a meal.   FLUoxetine 40 MG capsule Commonly known as: PROZAC Take 40 mg by mouth daily.   fluticasone 50 MCG/ACT nasal spray Commonly known as: FLONASE Place 1 spray into both nostrils daily.   hydrOXYzine 25 MG tablet Commonly known as: ATARAX Take 1 tablet (25 mg total) by mouth every 6 (six) hours as needed for anxiety.   Jardiance 10 MG  Tabs tablet Generic drug: empagliflozin Take 1 tablet (10 mg total) by mouth daily.   Lantus SoloStar 100 UNIT/ML Solostar Pen Generic drug: insulin glargine Inject 24 Units into the skin daily.   losartan 100 MG tablet Commonly known as: COZAAR Take 100 mg by mouth daily.   metFORMIN 1000 MG tablet Commonly known as: GLUCOPHAGE Take 1 tablet (1,000 mg total) by mouth 2 (two) times daily with a meal. For diabetes management   montelukast 10 MG tablet Commonly known as: SINGULAIR Take 10 mg by mouth at bedtime.   potassium chloride 10 MEQ tablet Commonly known as: KLOR-CON M Take 10 mEq by mouth daily.   pregabalin 100 MG capsule Commonly known as: LYRICA Take 100 mg by mouth 2 (two) times daily.   Symbicort 160-4.5 MCG/ACT inhaler Generic drug: budesonide-formoterol Inhale 2 puffs into the lungs 2 (two) times daily.   thiamine 100  MG tablet Commonly known as: VITAMIN B1 Take 100 mg by mouth 2 (two) times daily.   torsemide 20 MG tablet Commonly known as: DEMADEX Take 2 tablets (40 mg total) by mouth daily. Take twice daily in case of weight gain 2 to 3 lbs in 24 hrs or 5 lbs in 7 days What changed:  how much to take additional instructions   traZODone 100 MG tablet Commonly known as: DESYREL Take 1 tablet (100 mg total) by mouth at bedtime. For insomnia   Xarelto 20 MG Tabs tablet Generic drug: rivaroxaban Take 20 mg by mouth.        Follow-up Information     [facility]. Go in 11 day(s).   Specialty: Cardiology Why: Hospital follow up PLEASE bring a current medication list to appointment FREE Valet parking, Entrance C, off of [address] [employer] information: [address] [id number] [place], [place] Follow up.   Specialty: Internal Medicine Why: The office will call patient. Contact information: [place] [address] [phone]         [place] Follow up on 07/10/2022.   Why: 9:30 for follow up apt, Contact information: [place] [phone]               Discharge Exam: Filed Weights   06/22/22 0435 06/23/22 0415 06/24/22 0604  Weight: (!) 148.5 kg (!) 152.8 kg (!) 148.6 kg   BP 113/64 (BP Location: Left Arm)   Pulse 68   Temp 98.6 F (37 C) (Oral)   Resp 20   Ht 5\' 3"  (1.6 m)   Wt (!) 148.6 kg   LMP 07/21/2015   SpO2 97%   BMI 58.03 kg/m   Patient is feeling better, dyspnea and edema continue to improve, she is back to her baseline, no knee pain.   Neurology awake and alert ENT with no pallor Cardiovascular with S1 and S2 present and rhythmic with no gallops, rubs or murmurs Respiratory with no rales or wheezing Abdomen not distended No lower extremity edema   Condition at discharge: stable  The results of significant diagnostics from this hospitalization (including imaging, microbiology, ancillary and laboratory) are listed below for reference.   Imaging Studies: VAS [place] LOWER EXTREMITY VENOUS (DVT)  Result Date: 06/20/2022  Lower Venous DVT Study Patient Name:  Hailey Klein  Date of Exam:   06/20/2022 Medical Rec #: [id number]        Accession #:    [id number] Date of Birth: [birth year]         Patient Gender: F Patient Age:   32 years Exam Location:  [facility] Procedure:      VAS [place] LOWER EXTREMITY VENOUS (DVT) Referring Phys: Gean Birchwood --------------------------------------------------------------------------------  Indications: Edema. Other Indications: Hx of CHF and CKD. Risk Factors: Obesity. Limitations: Body habitus and poor ultrasound/tissue interface. Comparison Study: No previous exams Performing Technologist: Jody Hill RVT, RDMS  Examination Guidelines: A complete evaluation includes B-mode imaging, spectral Doppler, color Doppler,  and power Doppler as needed of all accessible portions of each vessel. Bilateral testing is considered an integral part of a complete examination. Limited examinations for reoccurring indications may be performed as noted. The reflux portion of the exam is performed with the patient in reverse Trendelenburg.  +--------+---------------+---------+-----------+----------+--------------------+ RIGHT   CompressibilityPhasicitySpontaneityPropertiesThrombus Aging       +--------+---------------+---------+-----------+----------+--------------------+  CFV     Full           Yes      Yes                                       +--------+---------------+---------+-----------+----------+--------------------+ SFJ     Full                                                              +--------+---------------+---------+-----------+----------+--------------------+ FV Prox Full           Yes      Yes                                       +--------+---------------+---------+-----------+----------+--------------------+ FV Mid  Full           Yes      Yes                                       +--------+---------------+---------+-----------+----------+--------------------+ FV      Full           Yes      Yes                                       Distal                                                                    +--------+---------------+---------+-----------+----------+--------------------+ PFV                    Yes      Yes                  patent by                                                                 color/doppler        +--------+---------------+---------+-----------+----------+--------------------+ POP     Full           Yes      Yes                                       +--------+---------------+---------+-----------+----------+--------------------+ PTV  Not visualized        +--------+---------------+---------+-----------+----------+--------------------+ PERO                                                 Not visualized       +--------+---------------+---------+-----------+----------+--------------------+   Right Technical Findings: Not visualized segments include Peroneal and posterior tibial veins.  +--------+---------------+---------+-----------+----------+--------------------+ LEFT    CompressibilityPhasicitySpontaneityPropertiesThrombus Aging       +--------+---------------+---------+-----------+----------+--------------------+ CFV     Full           Yes      Yes                                       +--------+---------------+---------+-----------+----------+--------------------+ SFJ     Full                                                              +--------+---------------+---------+-----------+----------+--------------------+ FV Prox Full           Yes      Yes                                       +--------+---------------+---------+-----------+----------+--------------------+ FV Mid  Full           Yes      Yes                                       +--------+---------------+---------+-----------+----------+--------------------+ FV      Full           Yes      Yes                                       Distal                                                                    +--------+---------------+---------+-----------+----------+--------------------+ PFV                    Yes      Yes                  patent by                                                                 color/doppler        +--------+---------------+---------+-----------+----------+--------------------+ POP  Full           Yes      Yes                                       +--------+---------------+---------+-----------+----------+--------------------+ PTV                                                  not  visualized       +--------+---------------+---------+-----------+----------+--------------------+ PERO                                                 not visualized       +--------+---------------+---------+-----------+----------+--------------------+   Left Technical Findings: Not visualized segments include Peroneal and posterior tibial veins.   Summary: BILATERAL: -No evidence of popliteal cyst, bilaterally. -Subcutaneous edema, bilaterally. RIGHT: - There is no evidence of deep vein thrombosis in the lower extremity. However, portions of this examination were limited- see technologist comments above.  - Ultrasound characteristics of enlarged lymph nodes are noted in the groin.  LEFT: - There is no evidence of deep vein thrombosis in the lower extremity. However, portions of this examination were limited- see technologist comments above.  - Ultrasound characteristics of enlarged lymph nodes noted in the groin.  *See table(s) above for measurements and observations. Electronically signed by Deitra Mayo MD on 06/20/2022 at 7:17:18 PM.    Final    ECHOCARDIOGRAM COMPLETE  Result Date: 06/20/2022    ECHOCARDIOGRAM REPORT   Patient Name:   Hailey Klein Date of Exam: 06/20/2022 Medical Rec #:  [id number]       Height:       63.0 in Accession #:    [id number]      Weight:       331.1 lb Date of Birth:  Oct 01, 1966        BSA:          2.396 m Patient Age:    69 years        BP:           112/66 mmHg Patient Gender: F               HR:           65 bpm. Exam Location:  Inpatient Procedure: 2D Echo, Cardiac Doppler, Color Doppler and Intracardiac            Opacification Agent Indications:    CHF I50.9  History:        Patient has no prior history of Echocardiogram examinations.                 CHF; Risk Factors:Hypertension and Diabetes.  Sonographer:    Ronny Flurry Sonographer#2:  Raquel Sarna Senior Referring Phys: [place]  1. Left ventricular ejection fraction, by estimation,  is 50 to 55%. The left ventricle has low normal function. The left ventricle demonstrates global hypokinesis. Left ventricular diastolic parameters are indeterminate.  2. Right ventricular systolic function is normal. The right ventricular size is normal.  3. Left atrial size was moderately dilated.  4. The mitral valve is normal in structure. No  evidence of mitral valve regurgitation. No evidence of mitral stenosis.  5. The aortic valve is normal in structure. There is mild calcification of the aortic valve. There is mild thickening of the aortic valve. Aortic valve regurgitation is not visualized. Aortic valve sclerosis/calcification is present, without any evidence of aortic stenosis.  6. The inferior vena cava is normal in size with greater than 50% respiratory variability, suggesting right atrial pressure of 3 mmHg. FINDINGS  Left Ventricle: Left ventricular ejection fraction, by estimation, is 50 to 55%. The left ventricle has low normal function. The left ventricle demonstrates global hypokinesis. The left ventricular internal cavity size was normal in size. There is no left ventricular hypertrophy. Left ventricular diastolic parameters are indeterminate. Indeterminate filling pressures. Right Ventricle: The right ventricular size is normal. No increase in right ventricular wall thickness. Right ventricular systolic function is normal. Left Atrium: Left atrial size was moderately dilated. Right Atrium: Right atrial size was normal in size. Pericardium: There is no evidence of pericardial effusion. Mitral Valve: The mitral valve is normal in structure. No evidence of mitral valve regurgitation. No evidence of mitral valve stenosis. Tricuspid Valve: The tricuspid valve is normal in structure. Tricuspid valve regurgitation is not demonstrated. No evidence of tricuspid stenosis. Aortic Valve: The aortic valve is normal in structure. There is mild calcification of the aortic valve. There is mild thickening of the  aortic valve. Aortic valve regurgitation is not visualized. Aortic valve sclerosis/calcification is present, without any evidence of aortic stenosis. Pulmonic Valve: The pulmonic valve was normal in structure. Pulmonic valve regurgitation is not visualized. No evidence of pulmonic stenosis. Aorta: The aortic root is normal in size and structure. Venous: The inferior vena cava is normal in size with greater than 50% respiratory variability, suggesting right atrial pressure of 3 mmHg. IAS/Shunts: No atrial level shunt detected by color flow Doppler.  LEFT VENTRICLE PLAX 2D LVIDd:         4.70 cm   Diastology LVIDs:         3.15 cm   LV e' medial:    7.18 cm/s LV PW:         1.10 cm   LV E/e' medial:  11.1 LV IVS:        1.00 cm   LV e' lateral:   7.71 cm/s LVOT diam:     2.10 cm   LV E/e' lateral: 10.4 LV SV:         95 LV SV Index:   40 LVOT Area:     3.46 cm  RIGHT VENTRICLE RV S prime:     12.30 cm/s TAPSE (M-mode): 2.0 cm LEFT ATRIUM             Index        RIGHT ATRIUM           Index LA diam:        4.80 cm 2.00 cm/m   RA Area:     19.10 cm LA Vol (A2C):   51.8 ml 21.62 ml/m  RA Volume:   50.60 ml  21.12 ml/m LA Vol (A4C):   48.8 ml 20.37 ml/m LA Biplane Vol: 51.6 ml 21.54 ml/m  AORTIC VALVE LVOT Vmax:   117.00 cm/s LVOT Vmean:  78.900 cm/s LVOT VTI:    0.274 m  AORTA Ao Root diam: 2.60 cm Ao Asc diam:  3.20 cm MITRAL VALVE MV Area (PHT): 2.95 cm    SHUNTS MV Decel Time: 257 msec    Systemic  VTI:  0.27 m MV E velocity: 79.90 cm/s  Systemic Diam: 2.10 cm MV A velocity: 71.00 cm/s MV E/A ratio:  1.13 Mihai Croitoru MD Electronically signed by Thurmon Fair MD Signature Date/Time: 06/20/2022/3:48:12 PM    Final    US RENAL  Result Date: 06/20/2022 CLINICAL DATA:  56 year old female with acute renal failure. EXAM: RENAL / URINARY TRACT ULTRASOUND COMPLETE COMPARISON:  None Available. FINDINGS: Right Kidney: Renal measurements: 10.8 x 5.0 x 5.2 cm = volume: 145 mL. Echogenicity within normal limits. No  mass or hydronephrosis visualized. Left Kidney: Renal measurements: 10.1 x 5.4 x 4.9 cm = volume: 138 mL. Echogenicity within normal limits. No mass or hydronephrosis visualized. Bladder: Appears normal for degree of bladder distention. Estimated bladder volume 351 mL. Other: None. IMPRESSION: Normal ultrasound appearance of both kidneys. Urinary bladder volume estimated at, query urinary 351 mL retention. Electronically Signed   By: Odessa Fleming M.D.   On: 06/20/2022 04:50   DG Chest 2 View  Result Date: 06/19/2022 CLINICAL DATA:  Left-sided chest pain and shortness of breath EXAM: CHEST - 2 VIEW COMPARISON:  12/01/2015 FINDINGS: Heart size upper limits of normal. Mild aortic tortuosity. The lungs are clear. The pulmonary vascularity is normal. No effusions. Mild curvature and degenerative change of the spine. IMPRESSION: No active cardiopulmonary disease. Electronically Signed   By: Paulina Fusi M.D.   On: 06/19/2022 16:07    Microbiology: No results found for this or any previous visit.  Labs: CBC: Recent Labs  Lab 06/19/22 1534 06/20/22 0147 06/21/22 0403 06/22/22 0429  WBC 7.5 7.9 8.5 9.4  NEUTROABS 4.8  --   --   --   HGB 10.5* 10.2* 9.9* 9.5*  HCT 31.4* 30.3* 30.2* 29.0*  MCV 86.7 86.6 87.8 89.0  PLT 346 317 338 295   Basic Metabolic Panel: Recent Labs  Lab 06/19/22 1840 06/20/22 0147 06/20/22 0509 06/21/22 0403 06/22/22 0429 06/23/22 0404 06/24/22 0317  NA  --  134* 132* 137 134* 132* 138  K  --  2.9* 3.0* 3.2* 4.0 4.1 3.8  CL  --  90* 88* 92* 93* 96* 99  CO2  --  30 32 32 31 27 29   GLUCOSE  --  539* 476* 97 257* 382* 142*  BUN  --  39* 39* 44* 45* 43* 35*  CREATININE  --  1.81* 1.78* 1.94* 1.66* 1.35* 1.32*  CALCIUM  --  7.8* 8.0* 7.8* 7.5* 7.5* 8.0*  MG 1.3* 1.7  --   --   --  1.8  --    Liver Function Tests: Recent Labs  Lab 06/19/22 1534 06/20/22 0147  AST 11* 9*  ALT 12 11  ALKPHOS 127* 105  BILITOT 0.5 0.5  PROT 7.4 6.5  ALBUMIN 2.9* 2.6*   CBG: Recent  Labs  Lab 06/23/22 1057 06/23/22 1712 06/23/22 2052 06/24/22 0558 06/24/22 1105  GLUCAP 269* 207* 223* 148* 248*    Discharge time spent: greater than 30 minutes.  Signed: 08/24/22, MD [facility] 06/24/2022

## 2022-06-24 NOTE — Progress Notes (Signed)
Pt A/O 4. OOB independent. Admitted for CHF. See note. Transition to po diuretic. Denies pain and OSB. VSS. Meds given from TOC. Education provided on meds and follow up care. Pt adequate for discharge.

## 2022-07-04 ENCOUNTER — Telehealth (HOSPITAL_COMMUNITY): Payer: Self-pay | Admitting: *Deleted

## 2022-07-04 ENCOUNTER — Encounter (HOSPITAL_COMMUNITY): Payer: Medicaid Other

## 2022-07-04 NOTE — Telephone Encounter (Signed)
Call attempted to confirm [facility] TOC appt 11 am on 07/04/22. HIPPA appropriate VM left with callback number.   Rhae Hammock, BSN, Scientist, clinical (histocompatibility and immunogenetics) Only

## 2022-07-10 ENCOUNTER — Inpatient Hospital Stay (INDEPENDENT_AMBULATORY_CARE_PROVIDER_SITE_OTHER): Payer: Medicaid Other | Admitting: Primary Care

## 2022-07-11 ENCOUNTER — Ambulatory Visit (HOSPITAL_COMMUNITY)
Admission: RE | Admit: 2022-07-11 | Discharge: 2022-07-11 | Disposition: A | Discharge status: Home / Self Care | Payer: Medicaid Other | Source: Ambulatory Visit | Attending: Adult Health | Admitting: Adult Health

## 2022-07-11 ENCOUNTER — Telehealth (HOSPITAL_COMMUNITY): Payer: Self-pay | Admitting: Licensed Clinical Social Worker

## 2022-07-11 VITALS — BP 130/78 | HR 86 | Wt 318.6 lb

## 2022-07-11 DIAGNOSIS — I823 Embolism and thrombosis of renal vein: Secondary | ICD-10-CM | POA: Insufficient documentation

## 2022-07-11 DIAGNOSIS — Z7984 Long term (current) use of oral hypoglycemic drugs: Secondary | ICD-10-CM | POA: Diagnosis not present

## 2022-07-11 DIAGNOSIS — F603 Borderline personality disorder: Secondary | ICD-10-CM | POA: Insufficient documentation

## 2022-07-11 DIAGNOSIS — Z9151 Personal history of suicidal behavior: Secondary | ICD-10-CM | POA: Insufficient documentation

## 2022-07-11 DIAGNOSIS — Z720 Tobacco use: Secondary | ICD-10-CM | POA: Diagnosis not present

## 2022-07-11 DIAGNOSIS — E1165 Type 2 diabetes mellitus with hyperglycemia: Secondary | ICD-10-CM | POA: Insufficient documentation

## 2022-07-11 DIAGNOSIS — F1010 Alcohol abuse, uncomplicated: Secondary | ICD-10-CM | POA: Diagnosis not present

## 2022-07-11 DIAGNOSIS — I10 Essential (primary) hypertension: Secondary | ICD-10-CM

## 2022-07-11 DIAGNOSIS — Z79899 Other long term (current) drug therapy: Secondary | ICD-10-CM | POA: Insufficient documentation

## 2022-07-11 DIAGNOSIS — I5032 Chronic diastolic (congestive) heart failure: Secondary | ICD-10-CM | POA: Insufficient documentation

## 2022-07-11 DIAGNOSIS — I130 Hypertensive heart and chronic kidney disease with heart failure and stage 1 through stage 4 chronic kidney disease, or unspecified chronic kidney disease: Secondary | ICD-10-CM | POA: Insufficient documentation

## 2022-07-11 DIAGNOSIS — Z6841 Body Mass Index (BMI) 40.0 and over, adult: Secondary | ICD-10-CM | POA: Diagnosis not present

## 2022-07-11 DIAGNOSIS — N1831 Chronic kidney disease, stage 3a: Secondary | ICD-10-CM | POA: Insufficient documentation

## 2022-07-11 DIAGNOSIS — M25569 Pain in unspecified knee: Secondary | ICD-10-CM | POA: Diagnosis not present

## 2022-07-11 DIAGNOSIS — Z7901 Long term (current) use of anticoagulants: Secondary | ICD-10-CM | POA: Insufficient documentation

## 2022-07-11 DIAGNOSIS — E1122 Type 2 diabetes mellitus with diabetic chronic kidney disease: Secondary | ICD-10-CM | POA: Insufficient documentation

## 2022-07-11 LAB — BASIC METABOLIC PANEL
Anion gap: 12 (ref 5–15)
BUN: 35 mg/dL — ABNORMAL HIGH (ref 6–20)
CO2: 24 mmol/L (ref 22–32)
Calcium: 8.6 mg/dL — ABNORMAL LOW (ref 8.9–10.3)
Chloride: 94 mmol/L — ABNORMAL LOW (ref 98–111)
Creatinine, Ser: 2.43 mg/dL — ABNORMAL HIGH (ref 0.44–1.00)
GFR, Estimated: 23 mL/min — ABNORMAL LOW (ref >60)
Glucose, Bld: 522 mg/dL (ref 70–99)
Potassium: 3.6 mmol/L (ref 3.5–5.1)
Sodium: 130 mmol/L — ABNORMAL LOW (ref 135–145)

## 2022-07-11 NOTE — Patient Instructions (Signed)
Labs done today. We will contact you only if your labs are abnormal.  No medication changes were made. Please continue all current medications as prescribed.  You have been referred to Dr. Chilton Si for General Cardiology and for Hypertension. Her office will contact you to schedule an appointment.    Your physician recommends that you schedule a follow-up appointment as needed  If you have any questions or concerns before your next appointment please send [place] a message through [place] or call our office at [phone].    TO LEAVE A MESSAGE FOR THE NURSE SELECT OPTION 2, PLEASE LEAVE A MESSAGE INCLUDING: YOUR NAME DATE OF BIRTH CALL BACK NUMBER REASON FOR CALL**this is important as we prioritize the call backs  YOU WILL RECEIVE A CALL BACK THE SAME DAY AS LONG AS YOU CALL BEFORE 4:00 PM   Do the following things EVERYDAY: Weigh yourself in the morning before breakfast. Write it down and keep it in a log. Take your medicines as prescribed Eat low salt foods--Limit salt (sodium) to 2000 mg per day.  Stay as active as you can everyday Limit all fluids for the day to less than 2 liters

## 2022-07-11 NOTE — Progress Notes (Signed)
Heart and Vascular Care Navigation  07/11/2022  Hailey Klein November 06, 1966 [id number]  Reason for Referral:  Patient is participating in a Managed Medicaid Plan: no [employer] with patient face to face for initial visit for Heart and Vascular Care Coordination.                                                                                                   Assessment:  Patient is a 56 yo female who recently moved to [place] to be closer to her boyfriend and his Granddad.  Patient states she has been living in [facility] and was well connected with medical resources. She receives SSI monthly, has [employer] and [employer]. Patient was receiving Peer Support Services through [facility] and felt very supported by her specialist Janee. Patient and friend state she plans to remain in [place] and will reach out to have her services transferred to [facility]. Patient also inquiring about transfer of other benefits (SSI and Food Stamps) transferred to [place].                                  HRT/VAS Care Coordination     Patients Home Cardiology Office Heart Failure Clinic  HF [facility]   Outpatient Care Team Social Worker   Social Worker Name: Lasandra Beech, [place] [phone]   Living arrangements for the past 2 months Apartment   Lives with: Significant Other; Parents  Boyfriend and his Granddad   Patient Current Insurance Coverage Medicaid   Patient Has Concern With Paying Medical Bills No   Does Patient Have Prescription Coverage? Yes   Home Assistive Devices/Equipment Scales   DME Agency NA       Social History:                                                                             SDOH Screenings   Alcohol Screen: Low Risk  (06/22/2022)   Alcohol Screen    Last Alcohol Screening Score (AUDIT): 0  Depression (PHQ2-9): Not on file  Financial Resource Strain: Medium Risk (07/11/2022)   Overall Financial Resource Strain (CARDIA)    Difficulty of  Paying Living Expenses: Somewhat hard  Food Insecurity: Food Insecurity Present (07/11/2022)   Hunger Vital Sign    Worried About Running Out of Food in the Last Year: Sometimes true    Ran Out of Food in the Last Year: Sometimes true  Housing: Low Risk  (07/11/2022)   Housing    Last Housing Risk Score: 0  Physical Activity: Not on file  Social Connections: Not on file  Stress: Not on file  Tobacco Use: Medium Risk (06/22/2022)   Patient History    Smoking Tobacco Use:  Former    Smokeless Tobacco Use: Unknown    Passive Exposure: Not on file  Transportation Needs: No Transportation Needs (07/11/2022)   PRAPARE - Administrator, Civil Service (Medical): No    Lack of Transportation (Non-Medical): No    SDOH Interventions: Financial Resources:  Corporate treasurer Interventions: Intervention Not Indicated   Food Insecurity:  Food Insecurity Interventions: Assist with [employer] Application  Housing Insecurity:  Housing Interventions: Intervention Not Indicated  Transportation:   Transportation Interventions: Payor Benefit   Follow-up plan:  CSW discussed the importance of notifying Social Security and [employer] of move to [place] to avoid any discontinued services due to out of county. Discussed at length and patient and friend verbalizes understanding. Patient was a no show to PCP appointment yesterday at [facility] as she was not aware that she had an appointment. CSW reached out to RN Care Manger to request assistance with getting appointment rescheduled for patient. Awaiting response and will contact patient with new appointment. Patient and friend deny any assistance with change of address and will follow up as indicated. CSW continues to be available as needed. Lasandra Beech, LCSW, CCSW-MCS [phone]

## 2022-07-11 NOTE — Telephone Encounter (Signed)
CSW received rescheduled appointment for [facility] PCP visit for August 02, 2022 at 10:30 am. Per patient request, CSW contacted patient by phone and left message as well as sent a text message with date and time. CSW available as needed. Lasandra Beech, LCSW, CCSW-MCS [phone]

## 2022-07-11 NOTE — Progress Notes (Signed)
HEART & VASCULAR TRANSITION OF CARE CONSULT NOTE     Referring Physician: Dr. Jomarie Longs Primary Care: Dr Benay Spice but wants PCP in [facility]  Primary Cardiologist: None  HPI: Referred to clinic by Dr. Jomarie Longs with [facility] for heart failure consultation.   Ms Dermody is a 56 y.o. 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 Recently relocated to [facility] from [facility]   Admitted 06/20/22 with A/C HFpEF, hyperglycemia (Hgb A1c 12.7%) and AKI on CKD III (baseline 1.3, up to 2.1 on admit). Had been drinking a lot of fluid at home. Had recently been treated with acute gout flare with indomethacin X 5 days. She was diuresed with IV lasix and discharged on 40 mg torsemide daily. Initiated on Empagliflozin 10 mg daily. Losartan held d/t AKI but resumed at discharge. Scr improved to 1.32. Discharge weight 327   Echo during admit: EF 50-55%, RV okay, moderate LAE  She is here today for hospital follow-up. Today she returns for HF follow up.Overall feeling fine. Not moving around much at home. Limited by joint pain. SOB with exertion. Denies PND/Orthopnea. Appetite ok. No fever or chills. She is not weighing at home because she doesn't have a scale. Smoking 1/2 pack per day. Taking all medications. Plans to stay in [place]. Lives with boyfriend.    Cardiac Testing  Echo 06/2022 EF 50-55%. RV ok.   Review of Systems: [y] = yes, [ ]  = no   General: Weight gain [ ] ; Weight loss [ ] ; Anorexia [ ] ; Fatigue [ ] ; Fever [ ] ; Chills [ ] ; Weakness [ ]   Cardiac: Chest pain/pressure [ ] ; Resting SOB [ ] ; Exertional SOB [ Y]; Orthopnea [ ] ; Pedal Edema [ ] ; Palpitations [ ] ; Syncope [ ] ; Presyncope [ ] ; Paroxysmal nocturnal dyspnea[ ]   Pulmonary: Cough [ ] ; Wheezing[ ] ; Hemoptysis[ ] ; Sputum [ ] ; Snoring [ ]   GI: Vomiting[ ] ; Dysphagia[ ] ; Melena[  ]; Hematochezia [ ] ; Heartburn[ ] ; Abdominal pain [ ] ; Constipation [ ] ; Diarrhea [ ] ; BRBPR [ ]   GU: Hematuria[ ] ; Dysuria [ ] ; Nocturia[ ]   Vascular: Pain in legs with walking [ ] ; Pain in feet with lying flat [ ] ; Non-healing sores [ ] ; Stroke [ ] ; TIA [ ] ; Slurred speech [ ] ;  Neuro: Headaches[ ] ; Vertigo[ ] ; Seizures[ ] ; Paresthesias[ ] ;Blurred vision [ ] ; Diplopia [ ] ; Vision changes [ ]   Ortho/Skin: Arthritis [ ] ; Joint pain [ Y]; Muscle pain [ ] ; Joint swelling [ ] ; Back Pain [Y ]; Rash [ ]   Psych: Depression[ ] ; Anxiety[ ]   Heme: Bleeding problems [ ] ; Clotting disorders [ ] ; Anemia [ Y]  Endocrine: Diabetes [ Y]; Thyroid dysfunction[ ]    Past Medical History:  Diagnosis Date   Diabetes mellitus without complication (HCC)    H/O suicide attempt    cut wrists - 19-y-o   Hypertension    PTSD (post-traumatic stress disorder)     Current Outpatient Medications  Medication Sig Dispense Refill   acetaminophen (TYLENOL) 325 MG tablet Take 2 tablets (650 mg total) by mouth every 6 (six) hours as needed for mild pain.     amLODipine (NORVASC) 10 MG tablet Take 10 mg by mouth daily.     ARIPiprazole (ABILIFY) 20 MG tablet Take 1 tablet (20 mg total) by mouth at bedtime. For mood control 30 tablet 0   aspirin  EC 81 MG tablet Take 81 mg by mouth daily. Swallow whole.     atorvastatin (LIPITOR) 80 MG tablet Take 80 mg by mouth daily.     BYDUREON BCISE 2 MG/0.85ML AUIJ Inject 2 mg into the skin every Tuesday.     carvedilol (COREG) 12.5 MG tablet Take 12.5 mg by mouth 2 (two) times daily with a meal.     empagliflozin (JARDIANCE) 10 MG TABS tablet Take 1 tablet (10 mg total) by mouth daily. 30 tablet 0   FLUoxetine (PROZAC) 40 MG capsule Take 40 mg by mouth daily.     fluticasone (FLONASE) 50 MCG/ACT nasal spray Place 1 spray into both nostrils daily.     hydrOXYzine (ATARAX/VISTARIL) 25 MG tablet Take 1 tablet (25 mg total) by mouth every 6 (six) hours as needed for anxiety. 60 tablet 0    LANTUS SOLOSTAR 100 UNIT/ML Solostar Pen Inject 24 Units into the skin daily.     losartan (COZAAR) 100 MG tablet Take 100 mg by mouth daily.     metFORMIN (GLUCOPHAGE) 1000 MG tablet Take 1 tablet (1,000 mg total) by mouth 2 (two) times daily with a meal. For diabetes management 1 tablet 0   montelukast (SINGULAIR) 10 MG tablet Take 10 mg by mouth at bedtime.     potassium chloride (KLOR-CON M) 10 MEQ tablet Take 10 mEq by mouth daily.     pregabalin (LYRICA) 100 MG capsule Take 100 mg by mouth 2 (two) times daily.     SYMBICORT 160-4.5 MCG/ACT inhaler Inhale 2 puffs into the lungs 2 (two) times daily.     thiamine (VITAMIN B1) 100 MG tablet Take 100 mg by mouth 2 (two) times daily.     torsemide (DEMADEX) 20 MG tablet Take 2 tablets (40 mg total) by mouth daily. Take twice daily in case of weight gain 2 to 3 lbs in 24 hrs or 5 lbs in 7 days 60 tablet 0   traZODone (DESYREL) 100 MG tablet Take 1 tablet (100 mg total) by mouth at bedtime. For insomnia 30 tablet 0   XARELTO 20 MG TABS tablet Take 20 mg by mouth.     No current facility-administered medications for this encounter.    Allergies  Allergen Reactions   Aspirin Rash    Low doses ago      Social History   Socioeconomic History   Marital status: Single    Spouse name: Not on file   Number of children: 0   Years of education: Not on file   Highest education level: High school graduate  Occupational History   Occupation: Disabilty  Tobacco Use   Smoking status: Former   Smokeless tobacco: Not on file  Vaping Use   Vaping Use: Never used  Substance and Sexual Activity   Alcohol use: Not Currently   Drug use: Yes    Types: Marijuana    Comment: 1 month ago   Sexual activity: Not on file  Other Topics Concern   Not on file  Social History Narrative   Not on file   Social Determinants of Health   Financial Resource Strain: Low Risk  (06/22/2022)   Overall Financial Resource Strain (CARDIA)    Difficulty of  Paying Living Expenses: Not very hard  Food Insecurity: No Food Insecurity (06/22/2022)   Hunger Vital Sign    Worried About Running Out of Food in the Last Year: Never true    Ran Out of Food in the Last Year: Never true  Transportation Needs: No Transportation Needs (06/22/2022)   PRAPARE - Hydrologist (Medical): No    Lack of Transportation (Non-Medical): No  Physical Activity: Not on file  Stress: Not on file  Social Connections: Not on file  Intimate Partner Violence: Not on file      Family History  Problem Relation Age of Onset   Alcoholism Other     Vitals:   07/11/22 1019  BP: 130/78  Pulse: 86  SpO2: 97%  Weight: (!) 144.5 kg (318 lb 9.6 oz)   Wt Readings from Last 3 Encounters:  07/11/22 (!) 144.5 kg (318 lb 9.6 oz)  06/24/22 (!) 148.6 kg (327 lb 9.7 oz)  08/19/15 (!) 139.3 kg (307 lb)    PHYSICAL EXAM: General:  No respiratory difficulty HEENT: normal Neck: supple. no JVD. Carotids 2+ bilat; no bruits. No lymphadenopathy or thryomegaly appreciated. Cor: PMI nondisplaced. Regular rate & rhythm. No rubs, gallops or murmurs. Lungs: clear Abdomen: obese, soft, nontender, nondistended. No hepatosplenomegaly. No bruits or masses. Good bowel sounds. Extremities: no cyanosis, clubbing, rash, edema Neuro: alert & oriented x 3, cranial nerves grossly intact. moves all 4 extremities w/o difficulty. Affect pleasant.     ASSESSMENT & PLAN:  1. HFpEF Echo 2021 EF 60-65%--> 06/2022 Echo 50-55%  RV ok.  NYHA II. Functionally limited due to knee pain.  GDMT  Diuretic-Volume status stable. Continue current dose of torsemide.  BB-none  Ace/ARB/ARNI- continue losartan.  MRA- Could consider down the road.  SGLT2i- on jardiance. Discussed possible side effects. If has UTI will need to stop. . Discussed low salt foods and limiting fluid intake. Suspect low sodium diet will be a challenge.  - Discussed daily weights. Needs to weigh and record  dailyl.  Check BMET   2. Uncontrolled DM II: 03/2022 Hgb A1C 12.2  - Needs to establish with PCP   3. Morbid obesity: Body mass index is 56.44 kg/m. Discussed portion control.   4. Hypertension Continue current regimen.   5. Tobacco Abuse Discussed cessation.   6. 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 CKD Stage IIIa Creatinine baseline ~ 1.5 Check BMET today    Referred to HFSW (PCP, Medications, Transportation, ETOH Abuse, Drug Abuse, Insurance, Financial ): Yes  PCP Transportation  Refer to Pharmacy: Yes or No Refer to Home Health:  No Refer to Advanced Heart Failure Clinic: No  Refer to General Cardiology: Yes --Refer to [facility] Follow up  as needed.   Anjel Perfetti NP-C  10:56 AM

## 2022-07-13 ENCOUNTER — Telehealth (HOSPITAL_COMMUNITY): Payer: Self-pay | Admitting: Licensed Clinical Social Worker

## 2022-07-13 NOTE — Telephone Encounter (Signed)
CSW contacted patient's friend Dyke Maes to confirm appointment at [facility] for 9-20 at 10:30 am. Dyke Maes appreciative of the call and will assist patient to appointment. Lasandra Beech, LCSW, CCSW-MCS [phone]

## 2022-08-02 ENCOUNTER — Ambulatory Visit (INDEPENDENT_AMBULATORY_CARE_PROVIDER_SITE_OTHER): Payer: Medicaid Other | Admitting: Primary Care

## 2022-08-10 ENCOUNTER — Emergency Department (HOSPITAL_COMMUNITY): Payer: Medicaid Other

## 2022-08-10 ENCOUNTER — Other Ambulatory Visit: Payer: Self-pay

## 2022-08-10 ENCOUNTER — Emergency Department (HOSPITAL_COMMUNITY)
Admission: EM | Admit: 2022-08-10 | Discharge: 2022-08-10 | Disposition: A | Discharge status: Other Acute Inpatient Hospital | Payer: Medicaid Other | Attending: Emergency Medicine | Admitting: Emergency Medicine

## 2022-08-10 DIAGNOSIS — E1110 Type 2 diabetes mellitus with ketoacidosis without coma: Secondary | ICD-10-CM | POA: Diagnosis not present

## 2022-08-10 DIAGNOSIS — I110 Hypertensive heart disease with heart failure: Secondary | ICD-10-CM | POA: Diagnosis not present

## 2022-08-10 DIAGNOSIS — I509 Heart failure, unspecified: Secondary | ICD-10-CM | POA: Insufficient documentation

## 2022-08-10 DIAGNOSIS — R109 Unspecified abdominal pain: Secondary | ICD-10-CM | POA: Insufficient documentation

## 2022-08-10 DIAGNOSIS — R110 Nausea: Secondary | ICD-10-CM | POA: Insufficient documentation

## 2022-08-10 DIAGNOSIS — Z79899 Other long term (current) drug therapy: Secondary | ICD-10-CM | POA: Insufficient documentation

## 2022-08-10 DIAGNOSIS — Z7984 Long term (current) use of oral hypoglycemic drugs: Secondary | ICD-10-CM | POA: Diagnosis not present

## 2022-08-10 DIAGNOSIS — E119 Type 2 diabetes mellitus without complications: Secondary | ICD-10-CM | POA: Insufficient documentation

## 2022-08-10 DIAGNOSIS — R079 Chest pain, unspecified: Secondary | ICD-10-CM | POA: Insufficient documentation

## 2022-08-10 DIAGNOSIS — M726 Necrotizing fasciitis: Secondary | ICD-10-CM | POA: Diagnosis not present

## 2022-08-10 DIAGNOSIS — R0602 Shortness of breath: Secondary | ICD-10-CM | POA: Insufficient documentation

## 2022-08-10 DIAGNOSIS — R2241 Localized swelling, mass and lump, right lower limb: Secondary | ICD-10-CM | POA: Insufficient documentation

## 2022-08-10 DIAGNOSIS — Z20822 Contact with and (suspected) exposure to covid-19: Secondary | ICD-10-CM | POA: Insufficient documentation

## 2022-08-10 DIAGNOSIS — R791 Abnormal coagulation profile: Secondary | ICD-10-CM | POA: Diagnosis not present

## 2022-08-10 DIAGNOSIS — Z7901 Long term (current) use of anticoagulants: Secondary | ICD-10-CM | POA: Diagnosis not present

## 2022-08-10 LAB — I-STAT VENOUS BLOOD GAS, ED
Acid-base deficit: 2 mmol/L (ref 0.0–2.0)
Bicarbonate: 20.5 mmol/L (ref 20.0–28.0)
Calcium, Ion: 0.85 mmol/L — CL (ref 1.15–1.40)
HCT: 30 % — ABNORMAL LOW (ref 36.0–46.0)
Hemoglobin: 10.2 g/dL — ABNORMAL LOW (ref 12.0–15.0)
O2 Saturation: 71 %
Potassium: 3.9 mmol/L (ref 3.5–5.1)
Sodium: 126 mmol/L — ABNORMAL LOW (ref 135–145)
TCO2: 21 mmol/L — ABNORMAL LOW (ref 22–32)
pCO2, Ven: 28.2 mmHg — ABNORMAL LOW (ref 44–60)
pH, Ven: 7.469 — ABNORMAL HIGH (ref 7.25–7.43)
pO2, Ven: 34 mmHg (ref 32–45)

## 2022-08-10 LAB — CBC WITH DIFFERENTIAL/PLATELET
Abs Immature Granulocytes: 0.25 10*3/uL — ABNORMAL HIGH (ref 0.00–0.07)
Basophils Absolute: 0.1 10*3/uL (ref 0.0–0.1)
Basophils Relative: 0 %
Eosinophils Absolute: 0 10*3/uL (ref 0.0–0.5)
Eosinophils Relative: 0 %
HCT: 27.9 % — ABNORMAL LOW (ref 36.0–46.0)
Hemoglobin: 9.1 g/dL — ABNORMAL LOW (ref 12.0–15.0)
Immature Granulocytes: 1 %
Lymphocytes Relative: 7 %
Lymphs Abs: 1.3 10*3/uL (ref 0.7–4.0)
MCH: 28.9 pg (ref 26.0–34.0)
MCHC: 32.6 g/dL (ref 30.0–36.0)
MCV: 88.6 fL (ref 80.0–100.0)
Monocytes Absolute: 1.3 10*3/uL — ABNORMAL HIGH (ref 0.1–1.0)
Monocytes Relative: 7 %
Neutro Abs: 14.8 10*3/uL — ABNORMAL HIGH (ref 1.7–7.7)
Neutrophils Relative %: 85 %
Platelets: 278 10*3/uL (ref 150–400)
RBC: 3.15 MIL/uL — ABNORMAL LOW (ref 3.87–5.11)
RDW: 12.8 % (ref 11.5–15.5)
WBC: 17.6 10*3/uL — ABNORMAL HIGH (ref 4.0–10.5)
nRBC: 0 % (ref 0.0–0.2)

## 2022-08-10 LAB — ABO/RH: ABO/RH(D): B POS

## 2022-08-10 LAB — MAGNESIUM: Magnesium: 1.3 mg/dL — ABNORMAL LOW (ref 1.7–2.4)

## 2022-08-10 LAB — CBG MONITORING, ED
Glucose-Capillary: >600 mg/dL (ref 70–99)
Glucose-Capillary: >600 mg/dL (ref 70–99)
Glucose-Capillary: 586 mg/dL (ref 70–99)

## 2022-08-10 LAB — TYPE AND SCREEN
ABO/RH(D): B POS
Antibody Screen: NEGATIVE

## 2022-08-10 LAB — BRAIN NATRIURETIC PEPTIDE: B Natriuretic Peptide: 165.2 pg/mL — ABNORMAL HIGH (ref 0.0–100.0)

## 2022-08-10 LAB — TROPONIN I (HIGH SENSITIVITY)
Troponin I (High Sensitivity): 23 ng/L — ABNORMAL HIGH (ref <18)
Troponin I (High Sensitivity): 22 ng/L — ABNORMAL HIGH (ref <18)

## 2022-08-10 LAB — RESP PANEL BY RT-PCR (FLU A&B, COVID) ARPGX2
Influenza A by PCR: NEGATIVE
Influenza B by PCR: NEGATIVE
SARS Coronavirus 2 by RT PCR: NEGATIVE

## 2022-08-10 LAB — LACTIC ACID, PLASMA: Lactic Acid, Venous: 2 mmol/L (ref 0.5–1.9)

## 2022-08-10 LAB — PROTIME-INR
INR: 1.3 — ABNORMAL HIGH (ref 0.8–1.2)
Prothrombin Time: 16 seconds — ABNORMAL HIGH (ref 11.4–15.2)

## 2022-08-10 LAB — BASIC METABOLIC PANEL
Anion gap: 18 — ABNORMAL HIGH (ref 5–15)
BUN: 40 mg/dL — ABNORMAL HIGH (ref 6–20)
CO2: 20 mmol/L — ABNORMAL LOW (ref 22–32)
Calcium: 7.5 mg/dL — ABNORMAL LOW (ref 8.9–10.3)
Chloride: 89 mmol/L — ABNORMAL LOW (ref 98–111)
Creatinine, Ser: 2.14 mg/dL — ABNORMAL HIGH (ref 0.44–1.00)
GFR, Estimated: 27 mL/min — ABNORMAL LOW (ref >60)
Glucose, Bld: 705 mg/dL (ref 70–99)
Potassium: 3.7 mmol/L (ref 3.5–5.1)
Sodium: 127 mmol/L — ABNORMAL LOW (ref 135–145)

## 2022-08-10 LAB — I-STAT BETA HCG BLOOD, ED (MC, WL, AP ONLY): I-stat hCG, quantitative: 9.1 m[IU]/mL — ABNORMAL HIGH (ref <5)

## 2022-08-10 LAB — APTT: aPTT: 33 seconds (ref 24–36)

## 2022-08-10 LAB — BETA-HYDROXYBUTYRIC ACID: Beta-Hydroxybutyric Acid: 3.69 mmol/L — ABNORMAL HIGH (ref 0.05–0.27)

## 2022-08-10 MED ORDER — DEXTROSE IN LACTATED RINGERS 5 % IV SOLN: INTRAVENOUS | Status: DC

## 2022-08-10 MED ORDER — SODIUM CHLORIDE 0.9 % IV SOLN
2.0000 g | Freq: Once | INTRAVENOUS | Status: AC
  Administered 2022-08-10: 2 g via INTRAVENOUS
  Filled 2022-08-10: qty 20

## 2022-08-10 MED ORDER — VANCOMYCIN HCL 1750 MG/350ML IV SOLN: 1750.0000 mg | INTRAVENOUS | Status: DC

## 2022-08-10 MED ORDER — DEXTROSE 50 % IV SOLN: 0.0000 mL | INTRAVENOUS | Status: DC | PRN

## 2022-08-10 MED ORDER — LACTATED RINGERS IV SOLN: INTRAVENOUS | Status: DC

## 2022-08-10 MED ORDER — VANCOMYCIN HCL IN DEXTROSE 1-5 GM/200ML-% IV SOLN: 1000.0000 mg | Freq: Once | INTRAVENOUS | Status: DC

## 2022-08-10 MED ORDER — MAGNESIUM SULFATE 2 GM/50ML IV SOLN
2.0000 g | Freq: Once | INTRAVENOUS | Status: DC
  Filled 2022-08-10: qty 50

## 2022-08-10 MED ORDER — INSULIN REGULAR(HUMAN) IN NACL 100-0.9 UT/100ML-% IV SOLN
INTRAVENOUS | Status: DC
  Administered 2022-08-10: 11.5 [IU]/h via INTRAVENOUS
  Filled 2022-08-10: qty 100

## 2022-08-10 MED ORDER — LINEZOLID 600 MG/300ML IV SOLN
600.0000 mg | Freq: Two times a day (BID) | INTRAVENOUS | Status: DC
  Administered 2022-08-10: 600 mg via INTRAVENOUS
  Filled 2022-08-10 (×2): qty 300

## 2022-08-10 MED ORDER — VANCOMYCIN HCL 2000 MG/400ML IV SOLN
2000.0000 mg | Freq: Once | INTRAVENOUS | Status: AC
  Administered 2022-08-10: 2000 mg via INTRAVENOUS
  Filled 2022-08-10: qty 400

## 2022-08-10 MED ORDER — LACTATED RINGERS IV BOLUS (SEPSIS)
1000.0000 mL | Freq: Once | INTRAVENOUS | Status: AC
  Administered 2022-08-10: 1000 mL via INTRAVENOUS

## 2022-08-10 MED ORDER — LACTATED RINGERS IV BOLUS: 1000.0000 mL | Freq: Once | INTRAVENOUS | Status: DC

## 2022-08-10 NOTE — ED Notes (Signed)
Gave report to janelle at [facility]. No further questions

## 2022-08-10 NOTE — ED Provider Triage Note (Signed)
Emergency Medicine Provider Triage Evaluation Note  Hailey Klein , a 56 y.o. female  was evaluated in triage.  Pt complains of chest pain since last night with associated shortness of breath.  Does have history of CHF.  Denies peripheral edema.  Denies history of DVT or PE.  Also reports abscess in the right inner thigh.  Review of Systems  Positive: As above Negative: As above  Physical Exam  BP 102/61 (BP Location: Right Arm)   Pulse 88   Temp 100.1 F (37.8 C) (Oral)   Resp (!) 22   LMP 07/21/2015   SpO2 96%  Gen:   Awake, no distress   Resp:  Normal effort  MSK:   Moves extremities without difficulty  Other:    Medical Decision Making  Medically screening exam initiated at 11:48 AM.  Appropriate orders placed.  Arcenia Scarbro was informed that the remainder of the evaluation will be completed by another provider, this initial triage assessment does not replace that evaluation, and the importance of remaining in the ED until their evaluation is complete.     Evlyn Courier, PA-C 08/10/22 1151

## 2022-08-10 NOTE — Sepsis Progress Note (Signed)
Notified bedside nurse of need to draw blood cultures and lactic acid, and administer antibiotics.

## 2022-08-10 NOTE — Sepsis Progress Note (Signed)
eLink is following this Code Sepsis. °

## 2022-08-10 NOTE — Progress Notes (Signed)
Pharmacy Antibiotic Note  Hailey Klein is a 56 y.o. female admitted on 08/10/2022 with cellulitis.  Pharmacy has been consulted for Vancomycin dosing.  Patient has R thigh boil with 3-4 days of drainage.  Noted SCr has fluctuated 1.3-2.4 over the last month.  Will monitor.  Plan: Vanc 2gm IV x 1 loading dose Vanc 1750 q48h (SCr 2.14, Vd 0.5) Calculated AUC 475 Watch renal fxn.  Follow-up cx data.  Check levels as indicated.  Height: 5\' 4"  (162.6 cm) Weight: (!) 142.9 kg (315 lb) IBW/kg (Calculated) : 54.7  Temp (24hrs), Avg:99.7 F (37.6 C), Min:99 F (37.2 C), Max:100.1 F (37.8 C)  Recent Labs  Lab 08/10/22 1153  WBC 17.6*  CREATININE 2.14*    Estimated Creatinine Clearance: 42.7 mL/min (A) (by C-G formula based on SCr of 2.14 mg/dL (H)).    Allergies  Allergen Reactions   Aspirin Rash    Low doses ago    Antimicrobials this admission: Vanc 9/28 >>  CTX  9/28 >>   Dose adjustments this admission:   Microbiology results:  Thank you for allowing pharmacy to be a part of this patient's care.  Elmer Ramp 08/10/2022 6:01 PM

## 2022-08-10 NOTE — Consult Note (Signed)
ORTHOPAEDIC CONSULTATION  REQUESTING PHYSICIAN: Rancour, Annie Main, MD  Chief Complaint: right medial groin/buttock pain  HPI: Hailey Klein is a 56 y.o. female diabetic who complains of  progressively worsening medial groin/buttock pain for 4-5 days.  Progressively worsening ability to ambulate.  Complains of fevers and chills.  Chest pain.  Given worsening symptoms presented emergency room for further evaluation.  Past Medical History:  Diagnosis Date   Diabetes mellitus without complication ([place])    H/O suicide attempt    cut wrists - 19-y-o   Hypertension    PTSD (post-traumatic stress disorder)    No past surgical history on file. Social History   Socioeconomic History   Marital status: Single    Spouse name: Not on file   Number of children: 0   Years of education: Not on file   Highest education level: High school graduate  Occupational History   Occupation: Disabilty  Tobacco Use   Smoking status: Former   Smokeless tobacco: Not on file  Vaping Use   Vaping Use: Never used  Substance and Sexual Activity   Alcohol use: Not Currently   Drug use: Yes    Types: Marijuana    Comment: 1 month ago   Sexual activity: Not on file  Other Topics Concern   Not on file  Social History Narrative   Not on file   Social Determinants of Health   Financial Resource Strain: Medium Risk (07/11/2022)   Overall Financial Resource Strain (CARDIA)    Difficulty of Paying Living Expenses: Somewhat hard  Food Insecurity: Food Insecurity Present (07/11/2022)   Hunger Vital Sign    Worried About [place] in the Last Year: Sometimes true    [place] in the Last Year: Sometimes true  Transportation Needs: No Transportation Needs (07/11/2022)   PRAPARE - Hydrologist (Medical): No    Lack of Transportation (Non-Medical): No  Physical Activity: Not on file  Stress: Not on file  Social Connections: Not on file   Family History  Problem  Relation Age of Onset   Alcoholism Other    Allergies  Allergen Reactions   Aspirin Rash    Low doses ago     Positive ROS: All other systems have been reviewed and were otherwise negative with the exception of those mentioned in the HPI and as above.  Physical Exam: General: Alert, no acute distress Cardiovascular: No pedal edema Respiratory: No cyanosis, no use of accessory musculature Skin: No lesions in the area of chief complaint Neurologic: Sensation intact distally Psychiatric: Patient is competent for consent with normal mood and affect Morbidly obese MUSCULOSKELETAL:  Tenderness and crepitation to the medial groin, skin is intact otherwise nontender throughout the lower extremity.  + groin pain with log roll  No knee or ankle effusion Knee stable to varus/ valgus stress Sens DPN, SPN, TN intact Motor EHL, ext, flex 5/5 Feet are warm and well perfused   IMAGING: CT scan reviewed demonstrates subcutaneous gas along the medial fascia of the groin extending up to the inferior aspect of the buttock.  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 Also discussed high risk of systemic involvement and organ failure, risk of rapid disease progression, risk of loss of limb. Recommended transfer to a tertiary care center. ED provider spoke with [facility] who accepted ED to ED transfer of the patient.    Willaim Sheng, MD  Contact information:   [phone] 7am-5pm epic message Dr. Zachery Dakins, or call office for patient follow up: [phone] After hours and holidays please check [email or web address] for group call information for [facility]

## 2022-08-10 NOTE — ED Triage Notes (Signed)
EMS stated, pt.  Has a boil on her right inner thigh and has had chest pain for the last 12 hours is worse with exertion.

## 2022-08-10 NOTE — ED Provider Notes (Signed)
[facility] EMERGENCY DEPARTMENT Provider Note   CSN: [id number] Arrival date & time: 08/10/22  1127     History  No chief complaint on file.   Hailey Klein is a 56 y.o. female.  Level 5 caveat for acuity of condition.  Patient here with chest pain that has been intermittent since last night associated with shortness of breath.  Similar to previous CHF exacerbations.  She describes pressure in the center of her chest that radiates to her mid back associate with shortness of breath.  Pain comes and goes lasting several minutes to hours at a time.  It is worse with exertion.  No history of DVT or PE.  Also complains of "boil" to her right inner thigh for the past 3 to 4 days.  There has been foul-smelling bloody drainage from her posterior thigh.  Increased swelling and redness to her medial thigh today.  No fever at home but febrile on arrival to the ED.  She has had nausea but no vomiting.  Diffuse abdominal pain.  Denies cough.  Denies pain with urination or blood in the urine.  States compliance with her medications including her insulin Does take Xarelto.  History of diabetes and hypertension and CHF Thinks she may have had a heart attack before.  No stents  The history is provided by the patient. The history is limited by the condition of the patient.       Home Medications Prior to Admission medications   Medication Sig Start Date End Date Taking? Authorizing Provider  acetaminophen (TYLENOL) 325 MG tablet Take 2 tablets (650 mg total) by mouth every 6 (six) hours as needed for mild pain. 12/09/15   Armandina Stammer I, NP  amLODipine (NORVASC) 10 MG tablet Take 10 mg by mouth daily.    [provider]  ARIPiprazole (ABILIFY) 20 MG tablet Take 1 tablet (20 mg total) by mouth at bedtime. For mood control 12/09/15   Armandina Stammer I, NP  aspirin EC 81 MG tablet Take 81 mg by mouth daily. Swallow whole.    [provider]  atorvastatin (LIPITOR) 80 MG  tablet Take 80 mg by mouth daily. 05/13/22   [provider]  BYDUREON BCISE 2 MG/0.85ML AUIJ Inject 2 mg into the skin every Tuesday. 05/13/22   [provider]  carvedilol (COREG) 12.5 MG tablet Take 12.5 mg by mouth 2 (two) times daily with a meal.    [provider]  empagliflozin (JARDIANCE) 10 MG TABS tablet Take 1 tablet (10 mg total) by mouth daily. 06/23/22   Arrien, York Ram, MD  FLUoxetine (PROZAC) 40 MG capsule Take 40 mg by mouth daily. 05/13/22   [provider]  fluticasone (FLONASE) 50 MCG/ACT nasal spray Place 1 spray into both nostrils daily. 05/13/22   [provider]  hydrOXYzine (ATARAX/VISTARIL) 25 MG tablet Take 1 tablet (25 mg total) by mouth every 6 (six) hours as needed for anxiety. 12/09/15   Armandina Stammer I, NP  LANTUS SOLOSTAR 100 UNIT/ML Solostar Pen Inject 24 Units into the skin daily. 05/13/22   [provider]  losartan (COZAAR) 100 MG tablet Take 100 mg by mouth daily. 05/13/22   [provider]  metFORMIN (GLUCOPHAGE) 1000 MG tablet Take 1 tablet (1,000 mg total) by mouth 2 (two) times daily with a meal. For diabetes management 12/09/15   Armandina Stammer I, NP  montelukast (SINGULAIR) 10 MG tablet Take 10 mg by mouth at bedtime. 05/13/22   [provider]  potassium chloride (KLOR-CON M) 10 MEQ tablet Take 10 mEq by mouth daily. 05/13/22   [provider]  pregabalin (LYRICA) 100 MG capsule Take 100 mg by mouth 2 (two) times daily.    [provider]  SYMBICORT 160-4.5 MCG/ACT inhaler Inhale 2 puffs into the lungs 2 (two) times daily. 05/13/22   [provider]  thiamine (VITAMIN B1) 100 MG tablet Take 100 mg by mouth 2 (two) times daily. 05/13/22   [provider]  torsemide (DEMADEX) 20 MG tablet Take 2 tablets (40 mg total) by mouth daily. Take twice daily in case of weight gain 2 to 3 lbs in 24 hrs or 5 lbs in 7 days 06/24/22 07/24/22  Arrien, Jimmy Picket, MD  traZODone  (DESYREL) 100 MG tablet Take 1 tablet (100 mg total) by mouth at bedtime. For insomnia 12/09/15   Lindell Spar I, NP  XARELTO 20 MG TABS tablet Take 20 mg by mouth. 05/13/22   [provider]      Allergies    Aspirin    Review of Systems   Review of Systems  Constitutional:  Positive for fever. Negative for activity change and appetite change.  HENT:  Negative for congestion and rhinorrhea.   Respiratory:  Positive for chest tightness and shortness of breath.   Gastrointestinal:  Positive for abdominal pain and nausea. Negative for vomiting.  Musculoskeletal:  Positive for arthralgias and myalgias.  Skin:  Positive for wound.  Neurological:  Negative for weakness.   all other systems are negative except as noted in the HPI and PMH.    Physical Exam Updated Vital Signs BP 130/81 (BP Location: Right Arm)   Pulse 88   Temp 100 F (37.8 C) (Oral)   Resp (!) 25   Ht 5\' 4"  (1.626 m)   Wt (!) 142.9 kg   LMP 07/21/2015   SpO2 99%   BMI 54.07 kg/m  Physical Exam Vitals and nursing note reviewed.  Constitutional:      General: She is not in acute distress.    Appearance: She is well-developed. She is obese. She is ill-appearing.  HENT:     Head: Normocephalic and atraumatic.     Mouth/Throat:     Pharynx: No oropharyngeal exudate.  Eyes:     Conjunctiva/sclera: Conjunctivae normal.     Pupils: Pupils are equal, round, and reactive to light.  Neck:     Comments: No meningismus. Cardiovascular:     Rate and Rhythm: Normal rate and regular rhythm.     Heart sounds: Normal heart sounds. No murmur heard. Pulmonary:     Effort: Pulmonary effort is normal. No respiratory distress.     Breath sounds: Normal breath sounds.  Abdominal:     Palpations: Abdomen is soft.     Tenderness: There is no abdominal tenderness. There is no guarding or rebound.  Genitourinary:    Comments: Chaperone present hannie RN there is erythema and edema to right medial thigh without discrete  fluctuance or drainage.  Extends to posterior thigh where there is a area of necrotic appearing skin with foul-smelling drainage.  No discrete fluctuance. Musculoskeletal:        General: Swelling and tenderness present. Normal range of motion.     Cervical back: Normal range of motion and neck supple.     Comments: Crepitus in anterior and medial thigh with erythema  Skin:    General: Skin is warm.  Neurological:     General: No focal deficit  present.     Mental Status: She is alert and oriented to person, place, and time. Mental status is at baseline.     Cranial Nerves: No cranial nerve deficit.     Motor: No abnormal muscle tone.     Coordination: Coordination normal.     Comments:  5/5 strength throughout. CN 2-12 intact.Equal grip strength.   Psychiatric:        Behavior: Behavior normal.     ED Results / Procedures / Treatments   Labs (all labs ordered are listed, but only abnormal results are displayed) Labs Reviewed  CBC WITH DIFFERENTIAL/PLATELET - Abnormal; Notable for the following components:      Result Value   WBC 17.6 (*)    RBC 3.15 (*)    Hemoglobin 9.1 (*)    HCT 27.9 (*)    Neutro Abs 14.8 (*)    Monocytes Absolute 1.3 (*)    Abs Immature Granulocytes 0.25 (*)    All other components within normal limits  BASIC METABOLIC PANEL - Abnormal; Notable for the following components:   Sodium 127 (*)    Chloride 89 (*)    CO2 20 (*)    Glucose, Bld 705 (*)    BUN 40 (*)    Creatinine, Ser 2.14 (*)    Calcium 7.5 (*)    GFR, Estimated 27 (*)    Anion gap 18 (*)    All other components within normal limits  MAGNESIUM - Abnormal; Notable for the following components:   Magnesium 1.3 (*)    All other components within normal limits  BRAIN NATRIURETIC PEPTIDE - Abnormal; Notable for the following components:   B Natriuretic Peptide 165.2 (*)    All other components within normal limits  LACTIC ACID, PLASMA - Abnormal; Notable for the following components:    Lactic Acid, Venous 2.0 (*)    All other components within normal limits  BETA-HYDROXYBUTYRIC ACID - Abnormal; Notable for the following components:   Beta-Hydroxybutyric Acid 3.69 (*)    All other components within normal limits  PROTIME-INR - Abnormal; Notable for the following components:   Prothrombin Time 16.0 (*)    INR 1.3 (*)    All other components within normal limits  I-STAT BETA HCG BLOOD, ED ([facility], [facility], [facility] ONLY) - Abnormal; Notable for the following components:   I-stat hCG, quantitative 9.1 (*)    All other components within normal limits  CBG MONITORING, ED - Abnormal; Notable for the following components:   Glucose-Capillary >600 (*)    All other components within normal limits  I-STAT VENOUS BLOOD GAS, ED - Abnormal; Notable for the following components:   pH, Ven 7.469 (*)    pCO2, Ven 28.2 (*)    TCO2 21 (*)    Sodium 126 (*)    Calcium, Ion 0.85 (*)    HCT 30.0 (*)    Hemoglobin 10.2 (*)    All other components within normal limits  CBG MONITORING, ED - Abnormal; Notable for the following components:   Glucose-Capillary >600 (*)    All other components within normal limits  CBG MONITORING, ED - Abnormal; Notable for the following components:   Glucose-Capillary 586 (*)    All other components within normal limits  TROPONIN I (HIGH SENSITIVITY) - Abnormal; Notable for the following components:   Troponin I (High Sensitivity) 23 (*)    All other components within normal limits  TROPONIN I (HIGH SENSITIVITY) - Abnormal; Notable for the following components:  Troponin I (High Sensitivity) 22 (*)    All other components within normal limits  RESP PANEL BY RT-PCR (FLU A&B, COVID) ARPGX2  CULTURE, BLOOD (ROUTINE X 2)  CULTURE, BLOOD (ROUTINE X 2)  URINE CULTURE  APTT  LACTIC ACID, PLASMA  URINALYSIS, ROUTINE W REFLEX MICROSCOPIC  I-STAT VENOUS BLOOD GAS, ED  TYPE AND SCREEN  ABO/RH  TROPONIN I (HIGH SENSITIVITY)  TROPONIN I (HIGH SENSITIVITY)    EKG EKG  Interpretation  Date/Time:  Thursday August 10 2022 11:28:14 EDT Ventricular Rate:  88 PR Interval:  150 QRS Duration: 140 QT Interval:  410 QTC Calculation: 496 R Axis:   -68 Text Interpretation: Normal sinus rhythm Right bundle branch block Left anterior fascicular block * Bifascicular block * Abnormal ECG When compared with ECG of 19-Jun-2022 15:20, PREVIOUS ECG IS PRESENT No significant change was found Confirmed by Ezequiel Essex [phone]) on 08/10/2022 4:04:40 PM  Radiology CT FEMUR RIGHT WO CONTRAST  Result Date: 08/10/2022 CLINICAL DATA:  Soft tissue infection of the thigh suspected. EXAM: CT OF THE LOWER RIGHT EXTREMITY WITHOUT CONTRAST TECHNIQUE: Multidetector CT imaging of the right lower extremity was performed according to the standard protocol. RADIATION DOSE REDUCTION: This exam was performed according to the departmental dose-optimization program which includes automated exposure control, adjustment of the mA and/or kV according to patient size and/or use of iterative reconstruction technique. COMPARISON:  None Available. FINDINGS: Bones/Joint/Cartilage No acute osseous abnormality. Mild arthritic changes of the knee joint. Hip joint is unremarkable. Ligaments Suboptimally assessed by CT. Muscles/tendons/subcutaneous soft tissues: There is skin thickening and subcutaneous emphysema in the left medial thigh which appears to be involving the adductor muscles. This process involves the proximal medial thigh just inferior to the gluteal region and measures approximately 20 x 7.1 x 7.5 cm (AP by TR by cc). Skin thickening on the medial aspect of the thigh suggesting cellulitis. IMPRESSION: 1. Skin thickening and subcutaneous emphysema about the posteromedial aspect of the thigh just below the gluteal region measuring at least 20 x 7.1 x 7.5 cm (AP by TR by cc) concerning for gas-forming infectious process. Surgical consultation for further management is recommended. 2.  No acute osseous  abnormality. Electronically Signed   By: Keane Police D.O.   On: 08/10/2022 19:30   CT ABDOMEN PELVIS WO CONTRAST  Result Date: 08/10/2022 CLINICAL DATA:  Sepsis, diabetic ketoacidosis, right groin/buttock wound EXAM: CT ABDOMEN AND PELVIS WITHOUT CONTRAST TECHNIQUE: Multidetector CT imaging of the abdomen and pelvis was performed following the standard protocol without IV contrast. RADIATION DOSE REDUCTION: This exam was performed according to the departmental dose-optimization program which includes automated exposure control, adjustment of the mA and/or kV according to patient size and/or use of iterative reconstruction technique. COMPARISON:  None Available. FINDINGS: Lower chest: No acute abnormality. Hepatobiliary: No focal liver abnormality is seen. No gallstones, gallbladder wall thickening, or biliary dilatation. Pancreas: Unremarkable Spleen: Unremarkable Adrenals/Urinary Tract: Thickening of the adrenal glands bilaterally is in keeping with adrenal hyperplasia. No discrete adrenal nodule is identified on this noncontrast examination. The kidneys are normal in size and position. No intrarenal or ureteral calculi. No hydronephrosis. There is mild bilateral nonspecific perinephric stranding. No discrete perinephric fluid collection is identified. The bladder is unremarkable. Stomach/Bowel: Stomach is within normal limits. Appendix appears normal. No evidence of bowel wall thickening, distention, or inflammatory changes. Vascular/Lymphatic: Aortic atherosclerosis. No frankly pathologic abdominal or pelvic adenopathy. Asymmetric shotty right inguinal adenopathy is present, however, likely reactive in nature. Reproductive: Uterus and bilateral adnexa  are unremarkable. Other: Tiny fat containing abdominal hernia. No abdominopelvic ascites. Musculoskeletal: No acute or significant osseous findings. IMPRESSION: 1. No acute intra-abdominal pathology identified. No definite radiographic explanation for the  patient's reported sepsis. 2. Mild perinephric stranding, nonspecific, possibly inflammatory in nature. Correlation with urinalysis and urine culture may be helpful to exclude an underlying inflammatory process such as pyelonephritis. 3. Asymmetric shotty right inguinal adenopathy, likely reactive in nature. Aortic Atherosclerosis (ICD10-I70.0). Electronically Signed   By: Fidela Salisbury M.D.   On: 08/10/2022 18:56   DG Chest Port 1 View  Result Date: 08/10/2022 CLINICAL DATA:  Questionable sepsis EXAM: PORTABLE CHEST 1 VIEW COMPARISON:  Chest x-ray 08/10/2022 FINDINGS: Heart is enlarged, unchanged. Questionable new patchy opacity in the left lung base with small left pleural effusion. Right lung clear. No pneumothorax. No acute fracture. IMPRESSION: Questionable new patchy opacity in the left lung base with small left pleural effusion. Electronically Signed   By: Ronney Asters M.D.   On: 08/10/2022 17:18   DG Chest 2 View  Result Date: 08/10/2022 CLINICAL DATA:  Chest pain EXAM: CHEST - 2 VIEW COMPARISON:  06/19/2022 FINDINGS: Mild cardiomegaly. Both lungs are clear. The visualized skeletal structures are unremarkable. IMPRESSION: Cardiomegaly without acute abnormality of the lungs. Electronically Signed   By: Delanna Ahmadi M.D.   On: 08/10/2022 12:28    Procedures Ultrasound ED Peripheral IV (Provider)  Date/Time: 08/10/2022 4:52 PM  Performed by: Ezequiel Essex, MD Authorized by: Ezequiel Essex, MD   Procedure details:    Indications: poor IV access     Skin Prep: chlorhexidine gluconate     Location:  Left AC   Angiocath:  20 G   Bedside Ultrasound Guided: Yes     Images: not archived     Patient tolerated procedure without complications: Yes     Dressing applied: Yes   .Critical Care  Performed by: Ezequiel Essex, MD Authorized by: Ezequiel Essex, MD   Critical care provider statement:    Critical care time (minutes):  60   Critical care time was exclusive of:  Separately  billable procedures and treating other patients   Critical care was necessary to treat or prevent imminent or life-threatening deterioration of the following conditions:  Sepsis and endocrine crisis   Critical care was time spent personally by me on the following activities:  Development of treatment plan with patient or surrogate, discussions with consultants, evaluation of patient's response to treatment, examination of patient, ordering and review of laboratory studies, ordering and review of radiographic studies, ordering and performing treatments and interventions, pulse oximetry, re-evaluation of patient's condition, review of old charts and obtaining history from patient or surrogate   I assumed direction of critical care for this patient from another provider in my specialty: no     Care discussed with: admitting provider       Medications Ordered in ED Medications  lactated ringers bolus 1,000 mL (has no administration in time range)  vancomycin (VANCOCIN) IVPB 1000 mg/200 mL premix (has no administration in time range)  cefTRIAXone (ROCEPHIN) 2 g in sodium chloride 0.9 % 100 mL IVPB (has no administration in time range)  insulin regular, human (MYXREDLIN) 100 units/ 100 mL infusion (has no administration in time range)  lactated ringers infusion (has no administration in time range)  dextrose 5 % in lactated ringers infusion (has no administration in time range)  dextrose 50 % solution 0-50 mL (has no administration in time range)    ED Course/ Medical Decision  Making/ A&P                           Medical Decision Making Amount and/or Complexity of Data Reviewed Labs: ordered. Decision-making details documented in ED Course. Radiology: ordered and independent interpretation performed. Decision-making details documented in ED Course. ECG/medicine tests: ordered and independent interpretation performed. Decision-making details documented in ED Course.  Risk Prescription drug  management.  Intermittent chest pain with shortness of breath since last night.  EKG without acute ischemia.  On arrival patient is febrile and tachycardic has a soft tissue infection in her groin with concern for necrotizing fasciitis.  First troponin minimally elevated at 23  Code sepsis was activated.  She is given broad-spectrum antibiotics and IV fluids after cultures are obtained.  Hyperglycemia with borderline anion gap and probably early DKA.  Insulin infusion initiated.  Labs consistent with leukocytosis of 17, AKI, anion gap of 18 with bicarb of 20 and concern for early DKA.  Continue IV fluids, insulin and antibiotics. X-ray was obtained to rule out soft tissue gas but would likely need CT imaging  Noncontrast CT scan does not show any evidence of cellulitis or abscess.  Does show mild questionable perinephric stranding on the right. No soft tissue gas or drainable abscess seen in right groin.  However on CT of the femur there is tracking soft tissue gas involving the posterior and lateral thigh on the right concerning for necrotizing fasciitis.  Discussed with Dr. Zachery Dakins of orthopedics and will review CT scan but as symptoms have been going on for several days and patient is hemodynamically stable will likely not need OR tonight. IV linezolid added as clindamycin is not available for necrotizing infections  D/w Dr. Zachery Dakins who reviewed CT images.  He feels this area is concerning and complicated area for potential debridement.  Recommends potential transfer to tertiary care facility but he will see in person and recommends discussion with general surgery as well.  Dr. Zachery Dakins has seen patient and is not comfortable with drainage at this facility Given location in her groin near neurovascular bundles..  Recommends transfer to [facility].  Discussed with Dr. Dema Severin of general surgery who defers to orthopedics.  Discussed with [facility] transfer line.  Discussed with  general surgery Dr. Ulyses Jarred who accepts patient to the ED.  Continue IV fluids, IV antibiotics, IV insulin.  Blood cultures have been obtained.  Chest pain has resolved.  Blood pressure mental status remained stable throughout ED course.  Blood sugar improving on insulin infusion. Patient stable for transfer.  She is maintaining her airway and blood pressure.  Continue IV insulin and IV antibiotics. Repeat troponin is flat and unchanged with low suspicion for ACS.  Needs surgical debridement at tertiary care facility       Final Clinical Impression(s) / ED Diagnoses Final diagnoses:  Necrotizing fasciitis ([place])  Diabetic ketoacidosis without coma associated with type 2 diabetes mellitus [facility])    Rx / DC Orders ED Discharge Orders     None         Ezequiel Essex, MD 08/10/22 2222

## 2022-08-11 LAB — CBG MONITORING, ED: Glucose-Capillary: 454 mg/dL — ABNORMAL HIGH (ref 70–99)

## 2022-08-15 LAB — CULTURE, BLOOD (ROUTINE X 2)
Culture: NO GROWTH
Culture: NO GROWTH
Special Requests: ADEQUATE

## 2022-08-29 ENCOUNTER — Ambulatory Visit (INDEPENDENT_AMBULATORY_CARE_PROVIDER_SITE_OTHER): Payer: Medicaid Other | Admitting: Primary Care

## 2022-08-30 DIAGNOSIS — M7989 Other specified soft tissue disorders: Secondary | ICD-10-CM | POA: Insufficient documentation

## 2022-08-31 MED FILL — Fentanyl Citrate Preservative Free (PF) Inj 100 MCG/2ML: INTRAMUSCULAR | Qty: 1 | Status: AC

## 2022-12-19 ENCOUNTER — Encounter: Payer: Self-pay | Admitting: Internal Medicine

## 2022-12-19 ENCOUNTER — Ambulatory Visit: Payer: Medicaid Other | Attending: Internal Medicine | Admitting: Internal Medicine

## 2022-12-19 VITALS — BP 121/79 | HR 77 | Ht 64.0 in | Wt 361.0 lb

## 2022-12-19 DIAGNOSIS — I5032 Chronic diastolic (congestive) heart failure: Secondary | ICD-10-CM | POA: Diagnosis present

## 2022-12-19 MED ORDER — TORSEMIDE 20 MG PO TABS: 40.0000 mg | ORAL_TABLET | Freq: Two times a day (BID) | ORAL | 1 refills | Status: DC

## 2022-12-19 NOTE — Patient Instructions (Signed)
Medication Instructions:  START: TORSEMIDE 40mg  TWICE DAILY  *If you need a refill on your cardiac medications before your next appointment, please call your pharmacy*  Lab Work:  Please return for Blood Work in [place]. No appointment needed, lab here at the office is open Monday-Friday from 8AM to 4PM and closed daily for lunch from 12:45-1:45.   If you have labs (blood work) drawn today and your tests are completely normal, you will receive your results only by: [place] (if you have MyChart) OR A paper copy in the mail If you have any lab test that is abnormal or we need to change your treatment, we will call you to review the results.  Testing/Procedures: None Ordered At This Time.   Follow-Up: At [facility], you and your health needs are our priority.  As part of our continuing mission to provide you with exceptional heart care, we have created designated Provider Care Teams.  These Care Teams include your primary Cardiologist (physician) and Advanced Practice Providers (APPs -  Physician Assistants and Nurse Practitioners) who all work together to provide you with the care you need, when you need it.  Your next appointment:   2 week(s)  Provider:   ANY APP

## 2022-12-19 NOTE — Progress Notes (Unsigned)
Cardiology Office Note:    Date:  12/19/2022   ID:  Hailey Klein, DOB [birth year], MRN [id number]  PCP:  [facility] [place] Providers Cardiologist:  None { Click to update primary MD,subspecialty MD or APP then REFRESH:1}    Referring MD: Conrad Coleman, NP   No chief complaint on file. SOB  History of Present Illness:    Hailey Klein is a 57 y.o. female with a hx of DM2, HTN, smoker.   She lives at a nursing home  She notes SOB currently. She noted LE edema. Takes torsemide 40 mg daily.      Cardiology Studies TTE 06/20/2022  1. Left ventricular ejection fraction, by estimation, is 50 to 55%. The  left ventricle has low normal function. The left ventricle demonstrates  global hypokinesis. Left ventricular diastolic parameters are  indeterminate.   2. Right ventricular systolic function is normal. The right ventricular  size is normal.   3. Left atrial size was moderately dilated.   4. The mitral valve is normal in structure. No evidence of mitral valve  regurgitation. No evidence of mitral stenosis.   5. The aortic valve is normal in structure. There is mild calcification  of the aortic valve. There is mild thickening of the aortic valve. Aortic  valve regurgitation is not visualized. Aortic valve  sclerosis/calcification is present, without any  evidence of aortic stenosis.   6. The inferior vena cava is normal in size with greater than 50%  respiratory variability, suggesting right atrial pressure of 3 mmHg.    Past Medical History:  Diagnosis Date   Diabetes mellitus without complication ([place])    H/O suicide attempt    cut wrists - 19-y-o   Hypertension    PTSD (post-traumatic stress disorder)     No past surgical history on file.  Current Medications: Current Meds  Medication Sig   acetaminophen (TYLENOL) 325 MG tablet Take 2 tablets (650 mg total) by mouth every 6 (six) hours as needed for mild pain.   amLODipine (NORVASC)  10 MG tablet Take 10 mg by mouth daily.   ARIPiprazole (ABILIFY) 20 MG tablet Take 1 tablet (20 mg total) by mouth at bedtime. For mood control   aspirin EC 81 MG tablet Take 81 mg by mouth daily. Swallow whole.   atorvastatin (LIPITOR) 80 MG tablet Take 80 mg by mouth daily.   BYDUREON BCISE 2 MG/0.85ML AUIJ Inject 2 mg into the skin every Tuesday.   carvedilol (COREG) 12.5 MG tablet Take 12.5 mg by mouth 2 (two) times daily with a meal.   empagliflozin (JARDIANCE) 10 MG TABS tablet Take 1 tablet (10 mg total) by mouth daily.   FLUoxetine (PROZAC) 40 MG capsule Take 40 mg by mouth daily.   fluticasone (FLONASE) 50 MCG/ACT nasal spray Place 1 spray into both nostrils daily.   hydrOXYzine (ATARAX/VISTARIL) 25 MG tablet Take 1 tablet (25 mg total) by mouth every 6 (six) hours as needed for anxiety.   LANTUS SOLOSTAR 100 UNIT/ML Solostar Pen Inject 24 Units into the skin daily.   losartan (COZAAR) 100 MG tablet Take 100 mg by mouth daily.   metFORMIN (GLUCOPHAGE) 1000 MG tablet Take 1 tablet (1,000 mg total) by mouth 2 (two) times daily with a meal. For diabetes management   montelukast (SINGULAIR) 10 MG tablet Take 10 mg by mouth at bedtime.   potassium chloride (KLOR-CON M) 10 MEQ tablet Take 10 mEq by mouth daily.   pregabalin (LYRICA) 100  MG capsule Take 100 mg by mouth 2 (two) times daily.   SYMBICORT 160-4.5 MCG/ACT inhaler Inhale 2 puffs into the lungs 2 (two) times daily.   thiamine (VITAMIN B1) 100 MG tablet Take 100 mg by mouth 2 (two) times daily.   traZODone (DESYREL) 100 MG tablet Take 1 tablet (100 mg total) by mouth at bedtime. For insomnia   XARELTO 20 MG TABS tablet Take 20 mg by mouth.     Allergies:   Aspirin   Social History   Socioeconomic History   Marital status: Single    Spouse name: Not on file   Number of children: 0   Years of education: Not on file   Highest education level: High school graduate  Occupational History   Occupation: Disabilty  Tobacco Use    Smoking status: Former   Smokeless tobacco: Not on file  Vaping Use   Vaping Use: Never used  Substance and Sexual Activity   Alcohol use: Not Currently   Drug use: Yes    Types: Marijuana    Comment: 1 month ago   Sexual activity: Not on file  Other Topics Concern   Not on file  Social History Narrative   Not on file   Social Determinants of Health   Financial Resource Strain: Medium Risk (07/11/2022)   Overall Financial Resource Strain (CARDIA)    Difficulty of Paying Living Expenses: Somewhat hard  Food Insecurity: Food Insecurity Present (07/11/2022)   Hunger Vital Sign    Worried About [place] in the Last Year: Sometimes true    [place] in the Last Year: Sometimes true  Transportation Needs: No Transportation Needs (07/11/2022)   PRAPARE - Hydrologist (Medical): No    Lack of Transportation (Non-Medical): No  Physical Activity: Not on file  Stress: Not on file  Social Connections: Not on file     Family History: The patient's ***family history includes Alcoholism in an other family member.  ROS:   Please see the history of present illness.    *** All other systems reviewed and are negative.  EKGs/Labs/Other Studies Reviewed:    The following studies were reviewed today:   EKG:  EKG is  ordered today.  The ekg ordered today demonstrates   12/19/2022- NSR, RBBB, inferior Q  Recent Labs: 06/20/2022: ALT 11; TSH 3.547 08/10/2022: B Natriuretic Peptide 165.2; BUN 40; Creatinine, Ser 2.14; Hemoglobin 10.2; Magnesium 1.3; Platelets 278; Potassium 3.9; Sodium 126   Recent Lipid Panel    Component Value Date/Time   CHOL 171 12/01/2015 0625   TRIG 73 12/01/2015 0625   HDL 45 12/01/2015 0625   CHOLHDL 3.8 12/01/2015 0625   VLDL 15 12/01/2015 0625   LDLCALC 111 (H) 12/01/2015 0625     Risk Assessment/Calculations:     Physical Exam:    VS:   Vitals:   12/19/22 1100  BP: 121/79  Pulse: 77  SpO2: 98%     BP  121/79   Pulse 77   Ht 5\' 4"  (1.626 m)   Wt (!) 361 lb (163.7 kg)   LMP 07/21/2015   SpO2 98%   BMI 61.97 kg/m     Wt Readings from Last 3 Encounters:  12/19/22 (!) 361 lb (163.7 kg)  08/10/22 (!) 315 lb (142.9 kg)  07/11/22 (!) 318 lb 9.6 oz (144.5 kg)     GEN: *** Well nourished, well developed in no acute distress HEENT: Normal NECK: No JVD;  No carotid bruits LYMPHATICS: No lymphadenopathy CARDIAC: ***RRR, no murmurs, rubs, gallops RESPIRATORY:  Clear to auscultation without rales, wheezing or rhonchi  ABDOMEN: Soft, non-tender, non-distended MUSCULOSKELETAL:  LE edema 2+ SKIN: Warm and dry NEUROLOGIC:  Alert and oriented x 3 PSYCHIATRIC:  Normal affect   ASSESSMENT:    SOB PLAN:    In order of problems listed above:  Torsemide 40 mg BID*** BNP. BMET 2 weeks FU APP in 2 weeks      {Are you ordering a CV Procedure (e.g. stress test, cath, DCCV, TEE, etc)?   Press F2        :[id number]}    Medication Adjustments/Labs and Tests Ordered: Current medicines are reviewed at length with the patient today.  Concerns regarding medicines are outlined above.  No orders of the defined types were placed in this encounter.  No orders of the defined types were placed in this encounter.   There are no Patient Instructions on file for this visit.   Signed, Janina Mayo, MD  12/19/2022 11:16 AM    [place]

## 2023-01-02 ENCOUNTER — Encounter: Payer: Self-pay | Admitting: Nurse Practitioner

## 2023-01-02 ENCOUNTER — Ambulatory Visit: Payer: Medicaid Other | Attending: Nurse Practitioner | Admitting: Nurse Practitioner

## 2023-01-02 VITALS — BP 120/74 | HR 80 | Ht 64.0 in | Wt 345.0 lb

## 2023-01-02 DIAGNOSIS — N1831 Chronic kidney disease, stage 3a: Secondary | ICD-10-CM | POA: Insufficient documentation

## 2023-01-02 DIAGNOSIS — E1169 Type 2 diabetes mellitus with other specified complication: Secondary | ICD-10-CM | POA: Insufficient documentation

## 2023-01-02 DIAGNOSIS — Z720 Tobacco use: Secondary | ICD-10-CM | POA: Diagnosis present

## 2023-01-02 DIAGNOSIS — I823 Embolism and thrombosis of renal vein: Secondary | ICD-10-CM | POA: Diagnosis not present

## 2023-01-02 DIAGNOSIS — I10 Essential (primary) hypertension: Secondary | ICD-10-CM

## 2023-01-02 DIAGNOSIS — I5032 Chronic diastolic (congestive) heart failure: Secondary | ICD-10-CM | POA: Insufficient documentation

## 2023-01-02 DIAGNOSIS — E785 Hyperlipidemia, unspecified: Secondary | ICD-10-CM | POA: Insufficient documentation

## 2023-01-02 NOTE — Progress Notes (Unsigned)
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  Past Medical History    Past Medical History:  Diagnosis Date   Diabetes mellitus without complication ([place])    H/O suicide attempt    cut wrists - 19-y-o   Hypertension    PTSD (post-traumatic stress disorder)    No past surgical history on file.  Allergies  Allergies  Allergen Reactions   Aspirin Rash    Low doses ago     Labs/Other Studies Reviewed    The following studies were reviewed today: Echo 06/20/2022:  1. Left ventricular ejection fraction, by estimation, is 50 to 55%. The  left ventricle has low normal function. The left ventricle demonstrates  global hypokinesis. Left ventricular diastolic parameters are  indeterminate.   2. Right ventricular systolic function is normal. The right ventricular  size is normal.   3. Left atrial size was moderately dilated.   4. The mitral valve is normal in structure. No evidence of mitral valve  regurgitation. No evidence of mitral stenosis.   5. The aortic valve is normal in structure. There is mild calcification  of the aortic valve. There is mild thickening of the aortic valve. Aortic  valve regurgitation is not visualized. Aortic valve  sclerosis/calcification is present, without any  evidence of aortic stenosis.   6. The inferior vena cava is normal in size with greater than 50%  respiratory variability, suggesting right atrial pressure of 3 mmHg.     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  She was hospitalized in August 2023 in the setting of acute on chronic diastolic heart failure, hyperglycemia, AKI on CKD stage III.  She was diuresed with IV Lasix and discharged on torsemide 40 mg daily.  She was started on Jardiance.  Echocardiogram during admission showed EF 50 to 55%, moderate LAE.  She was hospitalized in October 2023 at [facility] in the setting of necrotizing fasciitis, wound dehiscence of the right thigh and buttock s/p debridement in 07/2022, closure on 08/16/2022.  She underwent excisional debridement of the right thigh wound with washout on 08/31/2022.  She was last seen in the office on 12/19/2022 and noted shortness of breath, lower extremity edema. Torsemide was increased to 40 mg twice daily with plans for close follow-up.  She presents today for follow-up.  Since her last visit she has been stable overall from a cardiac standpoint.  She does note ongoing bilateral lower extremity edema, orthopnea (she reports occasional chest  tightness associated with orthopnea).  She denies symptoms concerning for angina. BP is stable, her weight is down.  She continues to smoke.  Other than her ongoing lower extremity edema, orthopnea, she denies any additional concerns today.  Home Medications    Current Outpatient Medications  Medication Sig Dispense Refill   acetaminophen (TYLENOL) 325 MG tablet Take 2  tablets (650 mg total) by mouth every 6 (six) hours as needed for mild pain.     amLODipine (NORVASC) 10 MG tablet Take 10 mg by mouth daily.     ARIPiprazole (ABILIFY) 20 MG tablet Take 1 tablet (20 mg total) by mouth at bedtime. For mood control 30 tablet 0   aspirin EC 81 MG tablet Take 81 mg by mouth daily. Swallow whole.     atorvastatin (LIPITOR) 80 MG tablet Take 80 mg by mouth daily.     BYDUREON BCISE 2 MG/0.85ML AUIJ Inject 2 mg into the skin every Tuesday.     carvedilol (COREG) 12.5 MG tablet Take 12.5 mg by mouth 2 (two) times daily with a meal.     empagliflozin (JARDIANCE) 10 MG TABS tablet Take 1 tablet (10 mg total) by mouth daily. 30 tablet 0   FLUoxetine (PROZAC) 40 MG capsule Take 40 mg by mouth daily.     fluticasone (FLONASE) 50 MCG/ACT nasal spray Place 1 spray into both nostrils daily.     hydrOXYzine (ATARAX/VISTARIL) 25 MG tablet Take 1 tablet (25 mg total) by mouth every 6 (six) hours as needed for anxiety. 60 tablet 0   LANTUS SOLOSTAR 100 UNIT/ML Solostar Pen Inject 24 Units into the skin daily.     losartan (COZAAR) 100 MG tablet Take 100 mg by mouth daily.     metFORMIN (GLUCOPHAGE) 1000 MG tablet Take 1 tablet (1,000 mg total) by mouth 2 (two) times daily with a meal. For diabetes management 1 tablet 0   montelukast (SINGULAIR) 10 MG tablet Take 10 mg by mouth at bedtime.     potassium chloride (KLOR-CON M) 10 MEQ tablet Take 10 mEq by mouth daily.     pregabalin (LYRICA) 100 MG capsule Take 100 mg by mouth 2 (two) times daily.     SYMBICORT 160-4.5 MCG/ACT inhaler Inhale 2 puffs into the lungs 2 (two) times daily.     thiamine (VITAMIN B1) 100 MG tablet Take 100 mg by mouth 2 (two) times daily.     torsemide (DEMADEX) 20 MG tablet Take 2 tablets (40 mg total) by mouth 2 (two) times daily. TAKE 2 TABLETS (20m) BY MOUTH TWICE DAILY 120 tablet 1   traZODone (DESYREL) 100 MG tablet Take 1 tablet (100 mg total) by mouth at bedtime. For insomnia 30 tablet 0   XARELTO  20 MG TABS tablet Take 20 mg by mouth.     No current facility-administered medications for this visit.   Review of Systems    She denies chest pain, palpitations, dyspnea, pnd, n, v, dizziness, syncope, weight gain, or early satiety. All other systems reviewed and are otherwise negative except as noted above.   Physical Exam    VS:  BP 120/74 (BP Location: Left Arm, Patient Position: Sitting)   Pulse 80   Ht 5' 4"$  (1.626 m)   Wt (!) 345 lb (156.5 kg)   LMP 07/21/2015   SpO2 97%   BMI 59.22 kg/m   GEN: Well nourished, well developed, in no acute distress. HEENT: normal. Neck: Supple, no JVD, carotid bruits, or masses. Cardiac: RRR, no murmurs,  rubs, or gallops. No clubbing, cyanosis, edema.  Radials/DP/PT 2+ and equal bilaterally.  Respiratory:  Respirations regular and unlabored, clear to auscultation bilaterally. GI: Soft, nontender, nondistended, BS + x 4. MS: no deformity or atrophy. Skin: warm and dry, no rash. Neuro:  Strength and sensation are intact. Psych: Normal affect.  Accessory Clinical Findings    ECG personally reviewed by me today - No EKG in office today.    Lab Results  Component Value Date   WBC 17.6 (H) 08/10/2022   HGB 10.2 (L) 08/10/2022   HCT 30.0 (L) 08/10/2022   MCV 88.6 08/10/2022   PLT 278 08/10/2022   Lab Results  Component Value Date   CREATININE 2.14 (H) 08/10/2022   BUN 40 (H) 08/10/2022   NA 126 (L) 08/10/2022   K 3.9 08/10/2022   CL 89 (L) 08/10/2022   CO2 20 (L) 08/10/2022   Lab Results  Component Value Date   ALT 11 06/20/2022   AST 9 (L) 06/20/2022   ALKPHOS 105 06/20/2022   BILITOT 0.5 06/20/2022   Lab Results  Component Value Date   CHOL 171 12/01/2015   HDL 45 12/01/2015   LDLCALC 111 (H) 12/01/2015   TRIG 73 12/01/2015   CHOLHDL 3.8 12/01/2015    Lab Results  Component Value Date   HGBA1C 12.7 (H) 06/20/2022    Assessment & Plan    1. Chronic diastolic heart failure: Most recent echo in 06/2022 showed EF 50  to 55%, moderate LAE.  Torsemide was recently increased in the setting of shortness of breath, lower extremity edema.  She continues to note ongoing bilateral lower extremity edema, orthopnea.  Weight is down.  Will check BNP, BMET today.  If renal function stable, consider escalation of diuretic versus addition of spironolactone.  Continue Jardiance, losartan, torsemide.  2. Hypertension: BP well controlled. Continue current antihypertensive regimen.   3. CKD stage III: Creatinine was 2.14 on 08/10/2022.  Repeat BMET pending.  4. Renal vein thrombosis: On chronic Eliquis.  5. Type 2 diabetes: A1c was 12.7 in 06/2022.  Monitored and managed per PCP.  6. Obesity: She is fairly sedentary.  Encourage increased activity as tolerated.  7. Tobacco use: Continues to smoke.  Full cessation advised.  8. Disposition: Follow-up in 1 month.     Lenna Sciara, NP 01/02/2023, 2:49 PM

## 2023-01-02 NOTE — Patient Instructions (Signed)
Medication Instructions:  Your physician recommends that you continue on your current medications as directed. Please refer to the Current Medication list given to you today.   *If you need a refill on your cardiac medications before your next appointment, please call your pharmacy*   Lab Work: Your physician recommends that you complete lab work today BNP & BMET  If you have labs (blood work) drawn today and your tests are completely normal, you will receive your results only by: MyChart Message (if you have MyChart) OR A paper copy in the mail If you have any lab test that is abnormal or we need to change your treatment, we will call you to review the results.   Testing/Procedures: NONE ordered at this time of appointment     Follow-Up: At [facility], you and your health needs are our priority.  As part of our continuing mission to provide you with exceptional heart care, we have created designated Provider Care Teams.  These Care Teams include your primary Cardiologist (physician) and Advanced Practice Providers (APPs -  Physician Assistants and Nurse Practitioners) who all work together to provide you with the care you need, when you need it.  We recommend signing up for the patient portal called "MyChart".  Sign up information is provided on this After Visit Summary.  MyChart is used to connect with patients for Virtual Visits (Telemedicine).  Patients are able to view lab/test results, encounter notes, upcoming appointments, etc.  Non-urgent messages can be sent to your provider as well.   To learn more about what you can do with MyChart, go to [email or web address].    Your next appointment:   1 month(s)  Provider:   Janina Mayo, MD  or Diona Browner, NP        Other Instructions

## 2023-01-03 ENCOUNTER — Encounter: Payer: Self-pay | Admitting: Nurse Practitioner

## 2023-01-03 LAB — BASIC METABOLIC PANEL
BUN/Creatinine Ratio: 35 — ABNORMAL HIGH (ref 9–23)
BUN: 67 mg/dL — ABNORMAL HIGH (ref 6–24)
CO2: 25 mmol/L (ref 20–29)
Calcium: 9.4 mg/dL (ref 8.7–10.2)
Chloride: 100 mmol/L (ref 96–106)
Creatinine, Ser: 1.93 mg/dL — ABNORMAL HIGH (ref 0.57–1.00)
Glucose: 97 mg/dL (ref 70–99)
Potassium: 4.5 mmol/L (ref 3.5–5.2)
Sodium: 143 mmol/L (ref 134–144)
eGFR: 30 mL/min/{1.73_m2} — ABNORMAL LOW (ref >59)

## 2023-01-03 LAB — BRAIN NATRIURETIC PEPTIDE: BNP: 44.5 pg/mL (ref 0.0–100.0)

## 2023-01-05 ENCOUNTER — Telehealth: Payer: Self-pay

## 2023-01-05 NOTE — Telephone Encounter (Signed)
Attempted to call pt. Call was dropped several times. Will call pt back to discuss lab results.

## 2023-01-09 NOTE — Telephone Encounter (Signed)
Attempted to reach pt to discuss results. Pt's phone number continued to ring. Not able to leave a VM.

## 2023-01-12 NOTE — Telephone Encounter (Signed)
Mailing letter with lab results to pt.

## 2023-02-01 ENCOUNTER — Emergency Department (HOSPITAL_COMMUNITY): Payer: Medicaid Other

## 2023-02-01 ENCOUNTER — Other Ambulatory Visit: Payer: Self-pay

## 2023-02-01 ENCOUNTER — Ambulatory Visit: Payer: Medicaid Other | Admitting: Internal Medicine

## 2023-02-01 ENCOUNTER — Inpatient Hospital Stay (HOSPITAL_COMMUNITY)
Admission: EM | Admit: 2023-02-01 | Discharge: 2023-02-03 | DRG: 287 | Disposition: A | Discharge status: Home / Self Care | Payer: Medicaid Other | Attending: Internal Medicine | Admitting: Internal Medicine

## 2023-02-01 ENCOUNTER — Encounter (HOSPITAL_COMMUNITY): Payer: Self-pay

## 2023-02-01 ENCOUNTER — Emergency Department (HOSPITAL_BASED_OUTPATIENT_CLINIC_OR_DEPARTMENT_OTHER): Payer: Medicaid Other

## 2023-02-01 DIAGNOSIS — Z5986 Financial insecurity: Secondary | ICD-10-CM

## 2023-02-01 DIAGNOSIS — Z886 Allergy status to analgesic agent status: Secondary | ICD-10-CM

## 2023-02-01 DIAGNOSIS — N1832 Chronic kidney disease, stage 3b: Secondary | ICD-10-CM | POA: Diagnosis present

## 2023-02-01 DIAGNOSIS — I130 Hypertensive heart and chronic kidney disease with heart failure and stage 1 through stage 4 chronic kidney disease, or unspecified chronic kidney disease: Secondary | ICD-10-CM | POA: Diagnosis present

## 2023-02-01 DIAGNOSIS — E876 Hypokalemia: Secondary | ICD-10-CM | POA: Insufficient documentation

## 2023-02-01 DIAGNOSIS — Z7984 Long term (current) use of oral hypoglycemic drugs: Secondary | ICD-10-CM

## 2023-02-01 DIAGNOSIS — I2510 Atherosclerotic heart disease of native coronary artery without angina pectoris: Principal | ICD-10-CM | POA: Diagnosis present

## 2023-02-01 DIAGNOSIS — Z7982 Long term (current) use of aspirin: Secondary | ICD-10-CM

## 2023-02-01 DIAGNOSIS — Z6841 Body Mass Index (BMI) 40.0 and over, adult: Secondary | ICD-10-CM

## 2023-02-01 DIAGNOSIS — M7989 Other specified soft tissue disorders: Secondary | ICD-10-CM | POA: Diagnosis not present

## 2023-02-01 DIAGNOSIS — E785 Hyperlipidemia, unspecified: Secondary | ICD-10-CM | POA: Diagnosis present

## 2023-02-01 DIAGNOSIS — E1122 Type 2 diabetes mellitus with diabetic chronic kidney disease: Secondary | ICD-10-CM | POA: Diagnosis present

## 2023-02-01 DIAGNOSIS — R079 Chest pain, unspecified: Secondary | ICD-10-CM | POA: Diagnosis not present

## 2023-02-01 DIAGNOSIS — R0602 Shortness of breath: Secondary | ICD-10-CM

## 2023-02-01 DIAGNOSIS — F17210 Nicotine dependence, cigarettes, uncomplicated: Secondary | ICD-10-CM | POA: Diagnosis present

## 2023-02-01 DIAGNOSIS — I5032 Chronic diastolic (congestive) heart failure: Secondary | ICD-10-CM | POA: Diagnosis present

## 2023-02-01 DIAGNOSIS — F603 Borderline personality disorder: Secondary | ICD-10-CM | POA: Diagnosis present

## 2023-02-01 DIAGNOSIS — Z794 Long term (current) use of insulin: Secondary | ICD-10-CM

## 2023-02-01 DIAGNOSIS — E1165 Type 2 diabetes mellitus with hyperglycemia: Secondary | ICD-10-CM | POA: Diagnosis present

## 2023-02-01 DIAGNOSIS — F1410 Cocaine abuse, uncomplicated: Secondary | ICD-10-CM | POA: Diagnosis present

## 2023-02-01 DIAGNOSIS — Z7901 Long term (current) use of anticoagulants: Secondary | ICD-10-CM

## 2023-02-01 DIAGNOSIS — R531 Weakness: Secondary | ICD-10-CM

## 2023-02-01 DIAGNOSIS — I452 Bifascicular block: Secondary | ICD-10-CM | POA: Diagnosis present

## 2023-02-01 DIAGNOSIS — I823 Embolism and thrombosis of renal vein: Secondary | ICD-10-CM | POA: Diagnosis present

## 2023-02-01 DIAGNOSIS — E1169 Type 2 diabetes mellitus with other specified complication: Secondary | ICD-10-CM | POA: Diagnosis present

## 2023-02-01 DIAGNOSIS — Z5941 Food insecurity: Secondary | ICD-10-CM

## 2023-02-01 DIAGNOSIS — Z811 Family history of alcohol abuse and dependence: Secondary | ICD-10-CM

## 2023-02-01 DIAGNOSIS — Z7951 Long term (current) use of inhaled steroids: Secondary | ICD-10-CM

## 2023-02-01 DIAGNOSIS — F32A Depression, unspecified: Secondary | ICD-10-CM | POA: Diagnosis present

## 2023-02-01 DIAGNOSIS — Z79899 Other long term (current) drug therapy: Secondary | ICD-10-CM

## 2023-02-01 DIAGNOSIS — F4310 Post-traumatic stress disorder, unspecified: Secondary | ICD-10-CM | POA: Diagnosis present

## 2023-02-01 LAB — TROPONIN I (HIGH SENSITIVITY)
Troponin I (High Sensitivity): 10 ng/L (ref <18)
Troponin I (High Sensitivity): 11 ng/L (ref <18)

## 2023-02-01 LAB — CBC
HCT: 32.6 % — ABNORMAL LOW (ref 36.0–46.0)
Hemoglobin: 10.2 g/dL — ABNORMAL LOW (ref 12.0–15.0)
MCH: 26.9 pg (ref 26.0–34.0)
MCHC: 31.3 g/dL (ref 30.0–36.0)
MCV: 86 fL (ref 80.0–100.0)
Platelets: 314 10*3/uL (ref 150–400)
RBC: 3.79 MIL/uL — ABNORMAL LOW (ref 3.87–5.11)
RDW: 14.4 % (ref 11.5–15.5)
WBC: 7 10*3/uL (ref 4.0–10.5)
nRBC: 0 % (ref 0.0–0.2)

## 2023-02-01 LAB — BASIC METABOLIC PANEL
Anion gap: 12 (ref 5–15)
BUN: 13 mg/dL (ref 6–20)
CO2: 22 mmol/L (ref 22–32)
Calcium: 8.4 mg/dL — ABNORMAL LOW (ref 8.9–10.3)
Chloride: 100 mmol/L (ref 98–111)
Creatinine, Ser: 1.65 mg/dL — ABNORMAL HIGH (ref 0.44–1.00)
GFR, Estimated: 36 mL/min — ABNORMAL LOW (ref >60)
Glucose, Bld: 302 mg/dL — ABNORMAL HIGH (ref 70–99)
Potassium: 3.7 mmol/L (ref 3.5–5.1)
Sodium: 134 mmol/L — ABNORMAL LOW (ref 135–145)

## 2023-02-01 LAB — I-STAT BETA HCG BLOOD, ED (MC, WL, AP ONLY): I-stat hCG, quantitative: <5 m[IU]/mL (ref <5)

## 2023-02-01 MED ORDER — RIVAROXABAN 10 MG PO TABS: 20.0000 mg | ORAL_TABLET | Freq: Every day | ORAL | Status: DC

## 2023-02-01 MED ORDER — ACETAMINOPHEN 325 MG PO TABS
650.0000 mg | ORAL_TABLET | Freq: Four times a day (QID) | ORAL | Status: DC | PRN
  Administered 2023-02-02 (×2): 650 mg via ORAL
  Filled 2023-02-01 (×2): qty 2

## 2023-02-01 MED ORDER — POLYETHYLENE GLYCOL 3350 17 G PO PACK: 17.0000 g | PACK | Freq: Every day | ORAL | Status: DC | PRN

## 2023-02-01 MED ORDER — FLUOXETINE HCL 20 MG PO CAPS
40.0000 mg | ORAL_CAPSULE | Freq: Every day | ORAL | Status: DC
  Administered 2023-02-02 – 2023-02-03 (×2): 40 mg via ORAL
  Filled 2023-02-01 (×2): qty 2

## 2023-02-01 MED ORDER — IOHEXOL 350 MG/ML SOLN
75.0000 mL | Freq: Once | INTRAVENOUS | Status: AC | PRN
  Administered 2023-02-01: 75 mL via INTRAVENOUS

## 2023-02-01 MED ORDER — MONTELUKAST SODIUM 10 MG PO TABS
10.0000 mg | ORAL_TABLET | Freq: Every day | ORAL | Status: DC
  Administered 2023-02-02 (×2): 10 mg via ORAL
  Filled 2023-02-01 (×2): qty 1

## 2023-02-01 MED ORDER — MELATONIN 5 MG PO TABS
5.0000 mg | ORAL_TABLET | Freq: Every evening | ORAL | Status: DC | PRN
  Administered 2023-02-02: 5 mg via ORAL
  Filled 2023-02-01: qty 1

## 2023-02-01 MED ORDER — NITROGLYCERIN 0.4 MG SL SUBL
0.4000 mg | SUBLINGUAL_TABLET | SUBLINGUAL | Status: DC | PRN
  Administered 2023-02-01: 0.4 mg via SUBLINGUAL
  Filled 2023-02-01: qty 1

## 2023-02-01 MED ORDER — ACETAMINOPHEN 500 MG PO TABS: 1000.0000 mg | ORAL_TABLET | ORAL | Status: DC

## 2023-02-01 MED ORDER — LIDOCAINE 5 % EX PTCH: 1.0000 | MEDICATED_PATCH | CUTANEOUS | Status: DC

## 2023-02-01 MED ORDER — OXYCODONE HCL 5 MG PO TABS: 5.0000 mg | ORAL_TABLET | ORAL | Status: DC

## 2023-02-01 MED ORDER — MOMETASONE FURO-FORMOTEROL FUM 200-5 MCG/ACT IN AERO
2.0000 | INHALATION_SPRAY | Freq: Two times a day (BID) | RESPIRATORY_TRACT | Status: DC
  Administered 2023-02-02 – 2023-02-03 (×2): 2 via RESPIRATORY_TRACT
  Filled 2023-02-01: qty 8.8

## 2023-02-01 MED ORDER — PROCHLORPERAZINE EDISYLATE 10 MG/2ML IJ SOLN: 5.0000 mg | Freq: Four times a day (QID) | INTRAMUSCULAR | Status: DC | PRN

## 2023-02-01 MED ORDER — ATORVASTATIN CALCIUM 80 MG PO TABS
80.0000 mg | ORAL_TABLET | Freq: Every day | ORAL | Status: DC
  Administered 2023-02-02 – 2023-02-03 (×2): 80 mg via ORAL
  Filled 2023-02-01 (×2): qty 1

## 2023-02-01 MED ORDER — ASPIRIN 81 MG PO TBEC
81.0000 mg | DELAYED_RELEASE_TABLET | Freq: Every day | ORAL | Status: DC
  Administered 2023-02-02 – 2023-02-03 (×2): 81 mg via ORAL
  Filled 2023-02-01 (×2): qty 1

## 2023-02-01 MED ORDER — OXYCODONE HCL 5 MG PO TABS
5.0000 mg | ORAL_TABLET | ORAL | Status: AC
  Administered 2023-02-01: 5 mg via ORAL
  Filled 2023-02-01: qty 1

## 2023-02-01 NOTE — ED Notes (Signed)
MD at bedside. 

## 2023-02-01 NOTE — ED Provider Notes (Signed)
[facility] Provider Note   CSN: [id number] Arrival date & time: 02/01/23  1241     History  Chief Complaint  Patient presents with   Chest Pain    [name] is a 57 y.o. female.  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  Patient reports that starting at 5 AM this morning she started having substernal chest discomfort.  Describes it as sharp.  Nonradiating.  Not associated with diaphoresis or vomiting.  Says that she has worsened shortness of breath with exertion but the chest pain does not change.  Has not taken any medications for at home including nitroglycerin.  She says she does not have a history of CAD or stents.  Reports being compliant with her Eliquis.  Has had right lower extremity swelling over the past week but reports that it has been swollen in the past.  Denies any recent cocaine use.       Home Medications Prior to Admission medications   Medication Sig Start Date End Date Taking? Authorizing Provider  acetaminophen (TYLENOL) 325 MG tablet Take 2 tablets (650 mg total) by mouth every 6 (six) hours as needed for mild pain. 12/09/15   Lindell Spar I, NP  amLODipine (NORVASC) 10 MG tablet Take 10 mg by mouth daily.    [provider]  ARIPiprazole (ABILIFY) 20 MG tablet Take 1 tablet (20 mg total) by mouth at bedtime. For mood control 12/09/15   Lindell Spar I, NP  aspirin EC 81 MG tablet Take 81 mg by mouth daily. Swallow whole.    [provider]  atorvastatin (LIPITOR) 80 MG tablet Take 80 mg by mouth daily. 05/13/22   [provider]  BYDUREON BCISE 2 MG/0.85ML AUIJ Inject 2 mg into the skin every Tuesday. 05/13/22   [provider]  carvedilol (COREG) 12.5 MG tablet Take 12.5 mg by mouth 2 (two) times daily with a meal.    [provider]   empagliflozin (JARDIANCE) 10 MG TABS tablet Take 1 tablet (10 mg total) by mouth daily. 06/23/22   Arrien, Jimmy Picket, MD  FLUoxetine (PROZAC) 40 MG capsule Take 40 mg by mouth daily. 05/13/22   [provider]  fluticasone (FLONASE) 50 MCG/ACT nasal spray Place 1 spray into both nostrils daily. 05/13/22   [provider]  hydrOXYzine (ATARAX/VISTARIL) 25 MG tablet Take 1 tablet (25 mg total) by mouth every 6 (six) hours as needed for anxiety. 12/09/15   Lindell Spar I, NP  LANTUS SOLOSTAR 100 UNIT/ML Solostar Pen Inject 24 Units into the skin daily. 05/13/22   [provider]  losartan (COZAAR) 100 MG tablet Take 100 mg by mouth daily. 05/13/22   [provider]  metFORMIN (GLUCOPHAGE) 1000 MG tablet Take 1 tablet (1,000 mg total) by mouth 2 (two) times daily with a meal. For diabetes management 12/09/15   Lindell Spar I, NP  montelukast (SINGULAIR) 10 MG tablet Take 10 mg by mouth at bedtime. 05/13/22   [provider]  potassium chloride (KLOR-CON M) 10 MEQ tablet Take 10 mEq by mouth daily. 05/13/22   [provider]  pregabalin (LYRICA) 100 MG capsule Take 100 mg by mouth 2 (two) times daily.    [provider]  SYMBICORT 160-4.5 MCG/ACT inhaler Inhale 2 puffs into the lungs 2 (two) times daily. 05/13/22   [provider]  thiamine (VITAMIN B1) 100 MG tablet Take 100 mg by mouth 2 (two) times daily. 05/13/22   [provider]  torsemide (DEMADEX) 20 MG tablet Take 2 tablets (40 mg total) by mouth 2 (two) times daily. TAKE 2 TABLETS (40mg ) BY MOUTH TWICE DAILY 12/19/22   Janina Mayo, MD  traZODone (DESYREL) 100 MG tablet Take 1 tablet (100 mg total) by mouth at bedtime. For insomnia 12/09/15   Lindell Spar I, NP  XARELTO 20 MG TABS tablet Take 20 mg by mouth. 05/13/22   [provider]      Allergies    Aspirin    Review of Systems   Review of Systems  Physical Exam Updated Vital Signs BP 124/69 (BP Location:  Right Arm)   Pulse 75   Temp 97.7 F (36.5 C) (Oral)   Resp 17   Ht 5\' 3"  (1.6 m)   Wt (!) 151.9 kg   LMP 07/21/2015   SpO2 95%   BMI 59.32 kg/m  Physical Exam Vitals and nursing note reviewed.  Constitutional:      General: She is not in acute distress.    Appearance: She is well-developed.  HENT:     Head: Normocephalic and atraumatic.     Right Ear: External ear normal.     Left Ear: External ear normal.     Nose: Nose normal.  Eyes:     Extraocular Movements: Extraocular movements intact.     Conjunctiva/sclera: Conjunctivae normal.     Pupils: Pupils are equal, round, and reactive to light.  Cardiovascular:     Rate and Rhythm: Normal rate and regular rhythm.     Heart sounds: No murmur heard. Pulmonary:     Effort: Pulmonary effort is normal. No respiratory distress.     Breath sounds: Normal breath sounds.  Chest:     Chest wall: Tenderness present.  Abdominal:     General: Abdomen is flat. There is no distension.     Palpations: Abdomen is soft. There is no mass.     Tenderness: There is no abdominal tenderness. There is no guarding.  Musculoskeletal:     Cervical back: Normal range of motion and neck supple.     Right lower leg: Edema (3+) present.     Left lower leg: Edema (2+) present.  Skin:    General: Skin is warm and dry.  Neurological:     Mental Status: She is alert and oriented to person, place, and time. Mental status is at baseline.  Psychiatric:        Mood and Affect: Mood normal.     ED Results / Procedures / Treatments   Labs (all labs ordered are listed, but only abnormal results are displayed) Labs Reviewed  BASIC METABOLIC PANEL - Abnormal; Notable for the following components:      Result Value   Sodium 134 (*)    Glucose, Bld 302 (*)    Creatinine, Ser 1.65 (*)    Calcium 8.4 (*)    GFR, Estimated 36 (*)    All other components within normal limits  CBC - Abnormal; Notable for the following components:   RBC 3.79 (*)     Hemoglobin 10.2 (*)    HCT 32.6 (*)    All other components within normal limits  RAPID URINE DRUG SCREEN, HOSP PERFORMED - Abnormal; Notable for the following components:   Cocaine POSITIVE (*)    All other components within normal limits  GLUCOSE, CAPILLARY -  Abnormal; Notable for the following components:   Glucose-Capillary 290 (*)    All other components within normal limits  CBC - Abnormal; Notable for the following components:   RBC 3.34 (*)    Hemoglobin 9.2 (*)    HCT 28.2 (*)    All other components within normal limits  COMPREHENSIVE METABOLIC PANEL - Abnormal; Notable for the following components:   Potassium 3.4 (*)    Glucose, Bld 274 (*)    Creatinine, Ser 1.94 (*)    Calcium 8.1 (*)    Total Protein 6.3 (*)    Albumin 2.5 (*)    AST 10 (*)    GFR, Estimated 30 (*)    All other components within normal limits  MAGNESIUM - Abnormal; Notable for the following components:   Magnesium 1.2 (*)    All other components within normal limits  GLUCOSE, CAPILLARY - Abnormal; Notable for the following components:   Glucose-Capillary 245 (*)    All other components within normal limits  LIPID PANEL - Abnormal; Notable for the following components:   HDL 36 (*)    All other components within normal limits  MRSA NEXT GEN BY PCR, NASAL  PHOSPHORUS  HEMOGLOBIN A1C  I-STAT BETA HCG BLOOD, ED ([facility], [facility], [facility] ONLY)  TROPONIN I (HIGH SENSITIVITY)  TROPONIN I (HIGH SENSITIVITY)    EKG EKG Interpretation  Date/Time:  Thursday February 01 2023 12:32:59 EDT Ventricular Rate:  73 PR Interval:  172 QRS Duration: 142 QT Interval:  424 QTC Calculation: 467 R Axis:   -64 Text Interpretation: Normal sinus rhythm Right bundle branch block Left anterior fascicular block  Bifascicular block  Abnormal ECG When compared with ECG of 10-Aug-2022 11:28, No significant change since last tracing Confirmed by Margaretmary Eddy [phone]) on 02/01/2023 7:24:34 PM  Radiology CT Angio Chest PE W and/or  Wo Contrast  Result Date: 02/01/2023 CLINICAL DATA:  Midsternal chest pain. EXAM: CT ANGIOGRAPHY CHEST WITH CONTRAST TECHNIQUE: Multidetector CT imaging of the chest was performed using the standard protocol during bolus administration of intravenous contrast. Multiplanar CT image reconstructions and MIPs were obtained to evaluate the vascular anatomy. RADIATION DOSE REDUCTION: This exam was performed according to the departmental dose-optimization program which includes automated exposure control, adjustment of the mA and/or kV according to patient size and/or use of iterative reconstruction technique. CONTRAST:  25mL OMNIPAQUE IOHEXOL 350 MG/ML SOLN COMPARISON:  None Available. FINDINGS: Cardiovascular: The thoracic aorta is normal in appearance. The subsegmental pulmonary arteries are limited in evaluation secondary to areas of overlying artifact. No evidence of pulmonary embolism. Normal heart size. No pericardial effusion. Mediastinum/Nodes: No enlarged mediastinal, hilar, or axillary lymph nodes. Thyroid gland, trachea, and esophagus demonstrate no significant findings. Lungs/Pleura: Mild atelectasis is seen within the posterior aspect of the bilateral lung bases. There is no evidence of an acute infiltrate, pleural effusion or pneumothorax. Upper Abdomen: No acute abnormality. Musculoskeletal: No chest wall abnormality. No acute or significant osseous findings. Review of the MIP images confirms the above findings. IMPRESSION: 1. No evidence of pulmonary embolism or other acute intrathoracic process. Electronically Signed   By: Virgina Norfolk M.D.   On: 02/01/2023 23:19   VAS [place] LOWER EXTREMITY VENOUS (DVT) (7a-7p)  Result Date: 02/01/2023  Lower Venous DVT Study Patient Name:  EASTER SIMPSON  Date of Exam:   02/01/2023 Medical Rec #: [id number]        Accession #:    [id number] Date of Birth: 1966/09/05  Patient Gender: F Patient Age:   28 years Exam Location:  [facility] Procedure:       VAS [place] LOWER EXTREMITY VENOUS (DVT) Referring Phys: Cherlynn June --------------------------------------------------------------------------------  Indications: Swelling.  Risk Factors: None identified. Limitations: Body habitus and poor ultrasound/tissue interface. Comparison Study: No prior studies. Performing Technologist: Oliver Hum RVT  Examination Guidelines: A complete evaluation includes B-mode imaging, spectral Doppler, color Doppler, and power Doppler as needed of all accessible portions of each vessel. Bilateral testing is considered an integral part of a complete examination. Limited examinations for reoccurring indications may be performed as noted. The reflux portion of the exam is performed with the patient in reverse Trendelenburg.  +---------+---------------+---------+-----------+----------+-------------------+ RIGHT    CompressibilityPhasicitySpontaneityPropertiesThrombus Aging      +---------+---------------+---------+-----------+----------+-------------------+ CFV      Full           Yes      Yes                                      +---------+---------------+---------+-----------+----------+-------------------+ SFJ      Full                                                             +---------+---------------+---------+-----------+----------+-------------------+ FV Prox  Full                                                             +---------+---------------+---------+-----------+----------+-------------------+ FV Mid                  Yes      Yes                                      +---------+---------------+---------+-----------+----------+-------------------+ FV Distal               Yes      Yes                                      +---------+---------------+---------+-----------+----------+-------------------+ PFV      Full                                                              +---------+---------------+---------+-----------+----------+-------------------+ POP      Full           Yes      Yes                                      +---------+---------------+---------+-----------+----------+-------------------+ PTV      Full                                                             +---------+---------------+---------+-----------+----------+-------------------+  PERO                                                  Not well visualized +---------+---------------+---------+-----------+----------+-------------------+   +----+---------------+---------+-----------+----------+--------------+ LEFTCompressibilityPhasicitySpontaneityPropertiesThrombus Aging +----+---------------+---------+-----------+----------+--------------+ CFV Full           Yes      Yes                                 +----+---------------+---------+-----------+----------+--------------+    Summary: RIGHT: - There is no evidence of deep vein thrombosis in the lower extremity. However, portions of this examination were limited- see technologist comments above.  - No cystic structure found in the popliteal fossa.  LEFT: - No evidence of common femoral vein obstruction.  *See table(s) above for measurements and observations. Electronically signed by Jamelle Haring on 02/01/2023 at 3:53:05 PM.    Final    DG Chest 2 View  Result Date: 02/01/2023 CLINICAL DATA:  Nonradiating midsternal chest pain since 0400 hours, RIGHT leg swelling for 1 week EXAM: CHEST - 2 VIEW COMPARISON:  08/10/2022 FINDINGS: Borderline enlargement of cardiac silhouette. Mediastinal contours and pulmonary vascularity normal. Lungs clear. No acute infiltrate, pleural effusion, or pneumothorax. Osseous structures unremarkable. IMPRESSION: No acute abnormalities. Electronically Signed   By: Lavonia Dana M.D.   On: 02/01/2023 14:09    Procedures Procedures   Medications Ordered in ED Medications  mometasone-formoterol  (DULERA) 200-5 MCG/ACT inhaler 2 puff (has no administration in time range)  montelukast (SINGULAIR) tablet 10 mg (10 mg Oral Given 02/02/23 0151)  FLUoxetine (PROZAC) capsule 40 mg (40 mg Oral Given 02/02/23 0915)  atorvastatin (LIPITOR) tablet 80 mg (80 mg Oral Given 02/02/23 0915)  aspirin EC tablet 81 mg (81 mg Oral Given 02/02/23 0915)  acetaminophen (TYLENOL) tablet 650 mg (650 mg Oral Given 02/02/23 0833)  polyethylene glycol (MIRALAX / GLYCOLAX) packet 17 g (has no administration in time range)  prochlorperazine (COMPAZINE) injection 5 mg (has no administration in time range)  melatonin tablet 5 mg (5 mg Oral Given 02/02/23 0152)  oxyCODONE (Oxy IR/ROXICODONE) immediate release tablet 5 mg (has no administration in time range)  HYDROmorphone (DILAUDID) injection 0.5 mg (0.5 mg Intravenous Given 02/02/23 0154)  insulin aspart (novoLOG) injection 0-20 Units (7 Units Subcutaneous Given 02/02/23 0915)  nicotine (NICODERM CQ - dosed in mg/24 hours) patch 14 mg (14 mg Transdermal Not Given 02/02/23 0925)  magnesium sulfate IVPB 2 g 50 mL (has no administration in time range)  potassium chloride SA (KLOR-CON M) CR tablet 40 mEq (40 mEq Oral Not Given 02/02/23 1031)  0.9% sodium chloride infusion (has no administration in time range)    Followed by  0.9% sodium chloride infusion (has no administration in time range)  perflutren lipid microspheres (DEFINITY) IV suspension (2 mLs Intravenous Given 02/02/23 1006)  insulin glargine-yfgn (SEMGLEE) injection 12 Units (has no administration in time range)  oxyCODONE (Oxy IR/ROXICODONE) immediate release tablet 5 mg (5 mg Oral Given 02/01/23 2224)  iohexol (OMNIPAQUE) 350 MG/ML injection 75 mL (75 mLs Intravenous Contrast Given 02/01/23 2314)    ED Course/ Medical Decision Making/ A&P Clinical Course as of 02/02/23 1048  Thu Feb 01, 2023  2048 Nitroglycerin did improve the patient's chest pain. [RP]  2355 Dr Nevada Crane for medicine to admit. [RP]  Fri Mar  94, 2024   0005 Dr Marcelle Smiling from cardiology consulted. [RP]    Clinical Course User Index [RP] Fransico Meadow, MD            HEART Score: 5                Medical Decision Making Amount and/or Complexity of Data Reviewed Labs: ordered. Radiology: ordered.  Risk Prescription drug management. Decision regarding hospitalization.   Hailey Klein is a 57 y.o. 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    Initial Ddx:  MI, PE, pericarditis, MSK pain/costochondritis, GERD, pneumonia, DVT  MDM:  Concerned about MI versus PE given the patient's symptoms.  Since she is on Eliquis feel that PE is less likely however with her sharp pain and shortness of breath will obtain CTA to evaluate for treatment failure.  However, with her right lower extremity swelling we will also obtain ultrasound to evaluate for blood clot.  This will also show if is if there is evidence of pneumonia.  Does have some risk factors for MI so obtain troponins and EKG and give nitroglycerin at this time.  Does have a aspirin allergy so we will withhold loading with aspirin at this time.  With her chest pain being reproducible with the above workup is negative could potentially be due to costochondritis.  Plan:  EKG Troponin  CTA chest DVT ultrasound right lower extremity Nitroglycerin  ED Summary/Re-evaluation:  Patient underwent the above workup which did not show evidence of DVT or PE.  EKG without any ischemic changes and her troponins were WNL.  Chest pain did improve with nitroglycerin.  Patient was found to have a heart score of 5 and discussed outpatient versus inpatient treatment and she opted to be admitted.  Discussed with hospitalist and cardiology will evaluate the patient.  This patient presents to the ED for concern of complaints listed in  HPI, this involves an extensive number of treatment options, and is a complaint that carries with it a high risk of complications and morbidity. Disposition including potential need for admission considered.   Dispo: Admit to Floor  Additional history obtained from family Records reviewed Outpatient Clinic Notes The following labs were independently interpreted: Serial Troponins and show no acute abnormality I independently reviewed the following imaging with scope of interpretation limited to determining acute life threatening conditions related to emergency care: CT Chest and agree with the radiologist interpretation with the following exceptions: none I personally reviewed and interpreted cardiac monitoring: normal sinus rhythm  I personally reviewed and interpreted the pt's EKG: see above for interpretation  I have reviewed the patients home medications and made adjustments as needed Consults: Cardiology and Hospitalist  Final Clinical Impression(s) / ED Diagnoses Final diagnoses:  Chest pain, unspecified type  Shortness of breath    Rx / DC Orders ED Discharge Orders     None         Fransico Meadow, MD 02/02/23 1048

## 2023-02-01 NOTE — Progress Notes (Signed)
Right lower extremity venous duplex has been completed. Preliminary results can be found in CV Proc through chart review.  Results were given to   02/01/23 3:09 PM Carlos Levering RVT

## 2023-02-01 NOTE — H&P (Addendum)
History and Physical  Hailey Klein JSE:[id number] DOB: 12-24-1965 DOA: 02/01/2023  Referring physician: Dr. Philip Aspen, [place] PCP: [place], [place]  Outpatient Specialists: Cardiology, general surgery. Patient coming from: Home.  Chief Complaint: Chest pain.  HPI: Hailey Klein is a 57 y.o. 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  It has been persistent since its onset.  The pain is sharp, substernal, nonradiating.  Associated with shortness of breath.  In the ED, the patient received a nitroglycerin with improvement of her symptoms.  Also complaining of right lower extremity edema and pain.  Bilateral lower extremity Doppler ultrasound was negative for DVT.  CT PE study was also negative.  High-sensitivity troponin was negative x 2.  No evidence of acute ischemia on twelve-lead EKG.  The patient was going to be discharged to follow-up with a cardiologist however she requested admission for stress test.  Cardiology was consulted.  The patient was admitted by [facility] hospitalist service.  ED Course: Tmax 98.3.  BP 120/85, pulse 82, respiratory 20, O2 saturation 95% on room air.  Lab studies markable for serum sodium 134, glucose 302, creatinine 1.65, GFR 36.  BNP 45.  Troponin 10, 11.  Review of Systems: Review of systems as noted in the HPI. All other systems reviewed and are negative.   Past Medical History:  Diagnosis Date   Diabetes mellitus without complication ([place])    H/O suicide attempt    cut wrists - 19-y-o   Hypertension    PTSD (post-traumatic stress disorder)    History reviewed. No pertinent surgical history.  Social History:  reports that she has been  smoking cigarettes. She does not have any smokeless tobacco history on file. She reports that she does not currently use alcohol. She reports that she does not currently use drugs after having used the following drugs: Marijuana.   Allergies  Allergen Reactions   Aspirin Rash    Low doses ago    Family History  Problem Relation Age of Onset   Alcoholism Other       Prior to Admission medications   Medication Sig Start Date End Date Taking? Authorizing Provider  acetaminophen (TYLENOL) 325 MG tablet Take 2 tablets (650 mg total) by mouth every 6 (six) hours as needed for mild pain. 12/09/15   Lindell Spar I, NP  amLODipine (NORVASC) 10 MG tablet Take 10 mg by mouth daily.    [provider]  ARIPiprazole (ABILIFY) 20 MG tablet Take 1 tablet (20 mg total) by mouth at bedtime. For mood control 12/09/15   Lindell Spar I, NP  aspirin EC 81 MG tablet Take 81 mg by mouth daily. Swallow whole.    [provider]  atorvastatin (LIPITOR) 80 MG tablet Take 80 mg by mouth daily. 05/13/22   [provider]  BYDUREON BCISE 2 MG/0.85ML AUIJ Inject 2 mg into the skin every Tuesday. 05/13/22   [provider]  carvedilol (COREG) 12.5 MG tablet Take 12.5 mg by mouth 2 (two) times daily with a meal.    [provider]  empagliflozin (JARDIANCE) 10 MG TABS tablet Take 1 tablet (10 mg total) by mouth daily. 06/23/22   Arrien, Jimmy Picket, MD  FLUoxetine (PROZAC) 40 MG capsule  Take 40 mg by mouth daily. 05/13/22   [provider]  fluticasone (FLONASE) 50 MCG/ACT nasal spray Place 1 spray into both nostrils daily. 05/13/22   [provider]  hydrOXYzine (ATARAX/VISTARIL) 25 MG tablet Take 1 tablet (25 mg total) by mouth every 6 (six) hours as needed for anxiety. 12/09/15   Lindell Spar I, NP  LANTUS SOLOSTAR 100 UNIT/ML Solostar Pen Inject 24 Units into the skin daily. 05/13/22   [provider]  losartan (COZAAR) 100 MG tablet Take 100 mg by mouth  daily. 05/13/22   [provider]  metFORMIN (GLUCOPHAGE) 1000 MG tablet Take 1 tablet (1,000 mg total) by mouth 2 (two) times daily with a meal. For diabetes management 12/09/15   Lindell Spar I, NP  montelukast (SINGULAIR) 10 MG tablet Take 10 mg by mouth at bedtime. 05/13/22   [provider]  potassium chloride (KLOR-CON M) 10 MEQ tablet Take 10 mEq by mouth daily. 05/13/22   [provider]  pregabalin (LYRICA) 100 MG capsule Take 100 mg by mouth 2 (two) times daily.    [provider]  SYMBICORT 160-4.5 MCG/ACT inhaler Inhale 2 puffs into the lungs 2 (two) times daily. 05/13/22   [provider]  thiamine (VITAMIN B1) 100 MG tablet Take 100 mg by mouth 2 (two) times daily. 05/13/22   [provider]  torsemide (DEMADEX) 20 MG tablet Take 2 tablets (40 mg total) by mouth 2 (two) times daily. TAKE 2 TABLETS (40mg ) BY MOUTH TWICE DAILY 12/19/22   Janina Mayo, MD  traZODone (DESYREL) 100 MG tablet Take 1 tablet (100 mg total) by mouth at bedtime. For insomnia 12/09/15   Lindell Spar I, NP  XARELTO 20 MG TABS tablet Take 20 mg by mouth. 05/13/22   [provider]    Physical Exam: BP 118/72 (BP Location: Left Arm)   Pulse 78   Temp 98.3 F (36.8 C) (Oral)   Resp 18   Ht 5\' 4"  (1.626 m)   Wt (!) 156.5 kg   LMP 07/21/2015   SpO2 100%   BMI 59.22 kg/m   General: 57 y.o. year-old female well developed well nourished in no acute distress.  Alert and oriented x3. Cardiovascular: Regular rate and rhythm with no rubs or gallops.  No thyromegaly or JVD noted.  No lower extremity edema. 2/4 pulses in all 4 extremities. Respiratory: Clear to auscultation with no wheezes or rales. Good inspiratory effort. Abdomen: Soft nontender nondistended with normal bowel sounds x4 quadrants. Muskuloskeletal: No cyanosis, clubbing or edema noted bilaterally Neuro: CN II-XII intact, strength, sensation, reflexes Skin: No ulcerative lesions noted or  rashes Psychiatry: Judgement and insight appear normal. Mood is appropriate for condition and setting          Labs on Admission:  Basic Metabolic Panel: Recent Labs  Lab 02/01/23 1343  NA 134*  K 3.7  CL 100  CO2 22  GLUCOSE 302*  BUN 13  CREATININE 1.65*  CALCIUM 8.4*   Liver Function Tests: No results for input(s): "AST", "ALT", "ALKPHOS", "BILITOT", "PROT", "ALBUMIN" in the last 168 hours. No results for input(s): "LIPASE", "AMYLASE" in the last 168 hours. No results for input(s): "AMMONIA" in the last 168 hours. CBC: Recent Labs  Lab 02/01/23 1343  WBC 7.0  HGB 10.2*  HCT 32.6*  MCV 86.0  PLT 314   Cardiac Enzymes: No results for input(s): "CKTOTAL", "CKMB", "CKMBINDEX", "TROPONINI" in the last 168 hours.  BNP (last 3 results) Recent Labs  06/19/22 1534 08/10/22 1153 01/02/23 1528  BNP 103.1* 165.2* 44.5    ProBNP (last 3 results) No results for input(s): "PROBNP" in the last 8760 hours.  CBG: No results for input(s): "GLUCAP" in the last 168 hours.  Radiological Exams on Admission: CT Angio Chest PE W and/or Wo Contrast  Result Date: 02/01/2023 CLINICAL DATA:  Midsternal chest pain. EXAM: CT ANGIOGRAPHY CHEST WITH CONTRAST TECHNIQUE: Multidetector CT imaging of the chest was performed using the standard protocol during bolus administration of intravenous contrast. Multiplanar CT image reconstructions and MIPs were obtained to evaluate the vascular anatomy. RADIATION DOSE REDUCTION: This exam was performed according to the departmental dose-optimization program which includes automated exposure control, adjustment of the mA and/or kV according to patient size and/or use of iterative reconstruction technique. CONTRAST:  76mL OMNIPAQUE IOHEXOL 350 MG/ML SOLN COMPARISON:  None Available. FINDINGS: Cardiovascular: The thoracic aorta is normal in appearance. The subsegmental pulmonary arteries are limited in evaluation secondary to areas of overlying artifact.  No evidence of pulmonary embolism. Normal heart size. No pericardial effusion. Mediastinum/Nodes: No enlarged mediastinal, hilar, or axillary lymph nodes. Thyroid gland, trachea, and esophagus demonstrate no significant findings. Lungs/Pleura: Mild atelectasis is seen within the posterior aspect of the bilateral lung bases. There is no evidence of an acute infiltrate, pleural effusion or pneumothorax. Upper Abdomen: No acute abnormality. Musculoskeletal: No chest wall abnormality. No acute or significant osseous findings. Review of the MIP images confirms the above findings. IMPRESSION: 1. No evidence of pulmonary embolism or other acute intrathoracic process. Electronically Signed   By: Virgina Norfolk M.D.   On: 02/01/2023 23:19   VAS [place] LOWER EXTREMITY VENOUS (DVT) (7a-7p)  Result Date: 02/01/2023  Lower Venous DVT Study Patient Name:  CELICIA CRITSER  Date of Exam:   02/01/2023 Medical Rec #: [id number]        Accession #:    [id number] Date of Birth: [birth year]         Patient Gender: F Patient Age:   70 years Exam [facility] Procedure:      VAS [place] LOWER EXTREMITY VENOUS (DVT) Referring Phys: Cherlynn June --------------------------------------------------------------------------------  Indications: Swelling.  Risk Factors: None identified. Limitations: Body habitus and poor ultrasound/tissue interface. Comparison Study: No prior studies. Performing Technologist: Oliver Hum RVT  Examination Guidelines: A complete evaluation includes B-mode imaging, spectral Doppler, color Doppler, and power Doppler as needed of all accessible portions of each vessel. Bilateral testing is considered an integral part of a complete examination. Limited examinations for reoccurring indications may be performed as noted. The reflux portion of the exam is performed with the patient in reverse Trendelenburg.  +---------+---------------+---------+-----------+----------+-------------------+ RIGHT     CompressibilityPhasicitySpontaneityPropertiesThrombus Aging      +---------+---------------+---------+-----------+----------+-------------------+ CFV      Full           Yes      Yes                                      +---------+---------------+---------+-----------+----------+-------------------+ SFJ      Full                                                             +---------+---------------+---------+-----------+----------+-------------------+ FV Prox  Full                                                             +---------+---------------+---------+-----------+----------+-------------------+ FV Mid                  Yes      Yes                                      +---------+---------------+---------+-----------+----------+-------------------+ FV Distal               Yes      Yes                                      +---------+---------------+---------+-----------+----------+-------------------+ PFV      Full                                                             +---------+---------------+---------+-----------+----------+-------------------+ POP      Full           Yes      Yes                                      +---------+---------------+---------+-----------+----------+-------------------+ PTV      Full                                                             +---------+---------------+---------+-----------+----------+-------------------+ PERO                                                  Not well visualized +---------+---------------+---------+-----------+----------+-------------------+   +----+---------------+---------+-----------+----------+--------------+ LEFTCompressibilityPhasicitySpontaneityPropertiesThrombus Aging +----+---------------+---------+-----------+----------+--------------+ CFV Full           Yes      Yes                                  +----+---------------+---------+-----------+----------+--------------+    Summary: RIGHT: - There is no evidence of deep vein thrombosis in the lower extremity. However, portions of this examination were limited- see technologist comments above.  - No cystic structure found in the popliteal fossa.  LEFT: - No evidence of common femoral vein obstruction.  *See table(s) above for measurements and observations. Electronically signed by Jamelle Haring on 02/01/2023 at 3:53:05 PM.    Final    DG Chest 2 View  Result Date: 02/01/2023 CLINICAL DATA:  Nonradiating midsternal chest pain since 0400 hours, RIGHT leg swelling for 1 week EXAM: CHEST - 2  VIEW COMPARISON:  08/10/2022 FINDINGS: Borderline enlargement of cardiac silhouette. Mediastinal contours and pulmonary vascularity normal. Lungs clear. No acute infiltrate, pleural effusion, or pneumothorax. Osseous structures unremarkable. IMPRESSION: No acute abnormalities. Electronically Signed   By: Lavonia Dana M.D.   On: 02/01/2023 14:09    EKG: I independently viewed the EKG done and my findings are as followed: Normal sinus rhythm rate of 67.  Nonspecific ST-T changes.  QTc 467.  Assessment/Plan Present on Admission:  Chest pain  Principal Problem:   Chest pain  Atypical chest pain rule out ACS First 2 sets of high-sensitivity troponin negative No evidence of acute ischemia on 12 EKG Monitor on telemetry The patient had requested stress test, defer to cardiology. Follow lipid panel, A1c. As needed analgesics.  Type 2 diabetes with hyperglycemia Last hemoglobin A1c 5.7 on 06/20/2022 Repeat hemoglobin A1c Start insulin sliding scale every 4 hours while NPO  Severe morbid obesity BMI 59 Recommend weight loss outpatient regular physical activity and healthy dieting.  Bilateral trace lower extremity edema, pain in the right lower extremity Possibly from inactivity No evidence of DVT on Doppler ultrasound bilaterally. PT OT evaluation Fall  precautions  History of necrotizing soft tissue infection status post I&D at outside facility, [facility] No constitutional symptoms Afebrile no leukocytosis Nonseptic appearing Recommend follow-up outpatient with surgery  HFpEF 50 to 55% Strict I's and O's and daily weight Rest of management per cardiology.  CKD 3B Appears to be at her baseline creatinine 1.65 with GFR of 36 Avoid nephrotoxic agents, dehydration and hypotension. Monitor urine output.  History of renal vein thrombosis Resume home Xarelto  Chronic anxiety/depression Resume home fluoxetine  History of cocaine, tobacco abuse Last cocaine use was last week UDS pending TOC consulted to assist with resources for polysubstance abuse cessation.    DVT prophylaxis: Home Xarelto  Code Status: Full code  Family Communication: Significant other at bedside.  Disposition Plan: Admitted to telemetry cardiac unit  Consults called: Cardiology consulted.  Admission status: Observation status.   Status is: Observation    Kayleen Memos MD [facility] Pager [phone]  If 7PM-7AM, please contact night-coverage [email or web address] Password [facility]  02/01/2023, 11:56 PM

## 2023-02-01 NOTE — ED Provider Triage Note (Signed)
Emergency Medicine Provider Triage Evaluation Note  Hailey Klein , a 57 y.o. female  was evaluated in triage.  Pt complains of substernal chest pain described as throbbing/burning which began at 4 AM this morning.  Patient endorses mild shortness of breath and nausea associated with the chest pain.  Patient also endorses right lower extremity swelling and pain.  Patient states that she has a history of blood clots and currently takes Eliquis..  Review of Systems  Positive: As above Negative: As above  Physical Exam  BP (!) 130/90 (BP Location: Right Arm)   Pulse 75   Temp 98.2 F (36.8 C)   Resp 20   Ht 5\' 4"  (1.626 m)   Wt (!) 156.5 kg   LMP 07/21/2015   SpO2 98%   BMI 59.22 kg/m  Gen:   Awake, no distress   Resp:  Normal effort  MSK:   Moves extremities without difficulty  Other:  Significant RLE edema, calf is painful to palpation  Medical Decision Making  Medically screening exam initiated at 12:57 PM.  Appropriate orders placed.  Sakura Otey was informed that the remainder of the evaluation will be completed by another provider, this initial triage assessment does not replace that evaluation, and the importance of remaining in the ED until their evaluation is complete.     Dorothyann Peng, PA-C 02/01/23 1258

## 2023-02-01 NOTE — ED Triage Notes (Signed)
Pt c/o non radiating midsternal chest pain started at 0400 today. Pt c/o right leg swellingx1wk. Pt c/o nausea. Pt denies vomiting or SOB. Pt has 1+ swelling of right leg. Pt has 2+ swelling of right foot. PT has 1+ right pedal pulse.

## 2023-02-01 NOTE — Progress Notes (Deleted)
Cardiology Office Note:    Date:  02/01/2023   ID:  Hailey Klein, DOB 1966/10/04, MRN [id number]  PCP:  [facility] [place] Providers Cardiologist:  Janina Mayo, MD     Referring MD: [place], [place]   No chief complaint on file. SOB  History of Present Illness:    Hailey Klein is a 57 y.o. female with a hx of DM2, HTN, CKD stage III, renal vein thrombosis on xarelto, DM2,  smoker.   She lives at a nursing home  She notes SOB currently. She noted LE edema. Takes torsemide 40 mg daily.   Interim Hx 02/01/2023     Cardiology Studies TTE 06/20/2022  1. Left ventricular ejection fraction, by estimation, is 50 to 55%. The  left ventricle has low normal function. The left ventricle demonstrates  global hypokinesis. Left ventricular diastolic parameters are  indeterminate.   2. Right ventricular systolic function is normal. The right ventricular  size is normal.   3. Left atrial size was moderately dilated.   4. The mitral valve is normal in structure. No evidence of mitral valve  regurgitation. No evidence of mitral stenosis.   5. The aortic valve is normal in structure. There is mild calcification  of the aortic valve. There is mild thickening of the aortic valve. Aortic  valve regurgitation is not visualized. Aortic valve  sclerosis/calcification is present, without any  evidence of aortic stenosis.   6. The inferior vena cava is normal in size with greater than 50%  respiratory variability, suggesting right atrial pressure of 3 mmHg.    Past Medical History:  Diagnosis Date   Diabetes mellitus without complication ([place])    H/O suicide attempt    cut wrists - 19-y-o   Hypertension    PTSD (post-traumatic stress disorder)     No past surgical history on file.  Current Medications: No outpatient medications have been marked as taking for the 02/01/23 encounter (Appointment) with Janina Mayo, MD.     Allergies:   Aspirin    Social History   Socioeconomic History   Marital status: Single    Spouse name: Not on file   Number of children: 0   Years of education: Not on file   Highest education level: High school graduate  Occupational History   Occupation: Disabilty  Tobacco Use   Smoking status: Every Day    Types: Cigarettes   Smokeless tobacco: Not on file  Vaping Use   Vaping Use: Never used  Substance and Sexual Activity   Alcohol use: Not Currently   Drug use: Not Currently    Types: Marijuana    Comment: 1 month ago   Sexual activity: Not on file  Other Topics Concern   Not on file  Social History Narrative   Not on file   Social Determinants of Health   Financial Resource Strain: Medium Risk (07/11/2022)   Overall Financial Resource Strain (CARDIA)    Difficulty of Paying Living Expenses: Somewhat hard  Food Insecurity: Food Insecurity Present (07/11/2022)   Hunger Vital Sign    Worried About [place] in the Last Year: Sometimes true    [place] in the Last Year: Sometimes true  Transportation Needs: No Transportation Needs (07/11/2022)   PRAPARE - Hydrologist (Medical): No    Lack of Transportation (Non-Medical): No  Physical Activity: Not on file  Stress: Not on file  Social Connections:  Not on file     Family History: The patient's family history includes Alcoholism in an other family member.  ROS:   Please see the history of present illness.     All other systems reviewed and are negative.  EKGs/Labs/Other Studies Reviewed:    The following studies were reviewed today:   EKG:  EKG is  ordered today.  The ekg ordered today demonstrates   12/19/2022- NSR, RBBB, inferior Q  Recent Labs: 06/20/2022: ALT 11; TSH 3.547 08/10/2022: Hemoglobin 10.2; Magnesium 1.3; Platelets 278 01/02/2023: BNP 44.5; BUN 67; Creatinine, Ser 1.93; Potassium 4.5; Sodium 143   Recent Lipid Panel    Component Value Date/Time   CHOL 171 12/01/2015  0625   TRIG 73 12/01/2015 0625   HDL 45 12/01/2015 0625   CHOLHDL 3.8 12/01/2015 0625   VLDL 15 12/01/2015 0625   LDLCALC 111 (H) 12/01/2015 0625     Risk Assessment/Calculations:     Physical Exam:    VS:   There were no vitals filed for this visit.    LMP 07/21/2015     Wt Readings from Last 3 Encounters:  02/01/23 (!) 345 lb 0.3 oz (156.5 kg)  01/02/23 (!) 345 lb (156.5 kg)  12/19/22 (!) 361 lb (163.7 kg)     GEN:  Well nourished, well developed in no acute distress HEENT: Normal NECK: No JVD; No carotid bruits LYMPHATICS: No lymphadenopathy CARDIAC: RRR, no murmurs, rubs, gallops RESPIRATORY:  Clear to auscultation without rales, wheezing or rhonchi  ABDOMEN: Soft, non-tender, non-distended MUSCULOSKELETAL:  LE edema 2+ SKIN: Warm and dry NEUROLOGIC:  Alert and oriented x 3 PSYCHIATRIC:  Normal affect   ASSESSMENT:    HfpEF: p/w signs of decompensation on 12/19/2022, saw Diona Browner 01/02/2023  increase torsemide PLAN:    In order of problems listed above:  Torsemide 40 mg BID BNP. BMET 2 weeks FU APP in 2 weeks      Medication Adjustments/Labs and Tests Ordered: Current medicines are reviewed at length with the patient today.  Concerns regarding medicines are outlined above.  No orders of the defined types were placed in this encounter.  No orders of the defined types were placed in this encounter.   There are no Patient Instructions on file for this visit.   Signed, Janina Mayo, MD  02/01/2023 1:35 PM    [place]

## 2023-02-01 NOTE — ED Notes (Signed)
Patient taken to CT at this time.

## 2023-02-02 ENCOUNTER — Observation Stay (HOSPITAL_COMMUNITY): Payer: Medicaid Other

## 2023-02-02 ENCOUNTER — Encounter (HOSPITAL_COMMUNITY): Payer: Self-pay | Admitting: Internal Medicine

## 2023-02-02 ENCOUNTER — Other Ambulatory Visit: Payer: Self-pay

## 2023-02-02 ENCOUNTER — Encounter (HOSPITAL_COMMUNITY)
Admission: EM | Disposition: A | Discharge status: Home / Self Care | Payer: Self-pay | Source: Home / Self Care | Attending: Internal Medicine

## 2023-02-02 DIAGNOSIS — E1122 Type 2 diabetes mellitus with diabetic chronic kidney disease: Secondary | ICD-10-CM | POA: Diagnosis present

## 2023-02-02 DIAGNOSIS — I5032 Chronic diastolic (congestive) heart failure: Secondary | ICD-10-CM | POA: Diagnosis present

## 2023-02-02 DIAGNOSIS — N1832 Chronic kidney disease, stage 3b: Secondary | ICD-10-CM | POA: Diagnosis present

## 2023-02-02 DIAGNOSIS — Z6841 Body Mass Index (BMI) 40.0 and over, adult: Secondary | ICD-10-CM | POA: Diagnosis not present

## 2023-02-02 DIAGNOSIS — R079 Chest pain, unspecified: Secondary | ICD-10-CM

## 2023-02-02 DIAGNOSIS — Z794 Long term (current) use of insulin: Secondary | ICD-10-CM | POA: Diagnosis not present

## 2023-02-02 DIAGNOSIS — E876 Hypokalemia: Secondary | ICD-10-CM | POA: Insufficient documentation

## 2023-02-02 DIAGNOSIS — Z886 Allergy status to analgesic agent status: Secondary | ICD-10-CM | POA: Diagnosis not present

## 2023-02-02 DIAGNOSIS — Z7982 Long term (current) use of aspirin: Secondary | ICD-10-CM | POA: Diagnosis not present

## 2023-02-02 DIAGNOSIS — F17210 Nicotine dependence, cigarettes, uncomplicated: Secondary | ICD-10-CM | POA: Diagnosis present

## 2023-02-02 DIAGNOSIS — F1410 Cocaine abuse, uncomplicated: Secondary | ICD-10-CM | POA: Diagnosis present

## 2023-02-02 DIAGNOSIS — Z7984 Long term (current) use of oral hypoglycemic drugs: Secondary | ICD-10-CM | POA: Diagnosis not present

## 2023-02-02 DIAGNOSIS — I823 Embolism and thrombosis of renal vein: Secondary | ICD-10-CM | POA: Diagnosis present

## 2023-02-02 DIAGNOSIS — R072 Precordial pain: Secondary | ICD-10-CM

## 2023-02-02 DIAGNOSIS — I130 Hypertensive heart and chronic kidney disease with heart failure and stage 1 through stage 4 chronic kidney disease, or unspecified chronic kidney disease: Secondary | ICD-10-CM | POA: Diagnosis present

## 2023-02-02 DIAGNOSIS — I2510 Atherosclerotic heart disease of native coronary artery without angina pectoris: Secondary | ICD-10-CM | POA: Diagnosis present

## 2023-02-02 DIAGNOSIS — E1165 Type 2 diabetes mellitus with hyperglycemia: Secondary | ICD-10-CM | POA: Diagnosis present

## 2023-02-02 DIAGNOSIS — F4310 Post-traumatic stress disorder, unspecified: Secondary | ICD-10-CM | POA: Diagnosis present

## 2023-02-02 DIAGNOSIS — Z79899 Other long term (current) drug therapy: Secondary | ICD-10-CM | POA: Diagnosis not present

## 2023-02-02 DIAGNOSIS — E1169 Type 2 diabetes mellitus with other specified complication: Secondary | ICD-10-CM | POA: Diagnosis present

## 2023-02-02 DIAGNOSIS — R0789 Other chest pain: Secondary | ICD-10-CM | POA: Diagnosis not present

## 2023-02-02 DIAGNOSIS — E785 Hyperlipidemia, unspecified: Secondary | ICD-10-CM | POA: Diagnosis present

## 2023-02-02 DIAGNOSIS — I452 Bifascicular block: Secondary | ICD-10-CM | POA: Diagnosis present

## 2023-02-02 DIAGNOSIS — F32A Depression, unspecified: Secondary | ICD-10-CM | POA: Diagnosis present

## 2023-02-02 DIAGNOSIS — F603 Borderline personality disorder: Secondary | ICD-10-CM | POA: Diagnosis present

## 2023-02-02 HISTORY — PX: LEFT HEART CATH AND CORONARY ANGIOGRAPHY: CATH118249

## 2023-02-02 HISTORY — DX: Hypokalemia: E87.6

## 2023-02-02 LAB — MRSA NEXT GEN BY PCR, NASAL: MRSA by PCR Next Gen: NOT DETECTED

## 2023-02-02 LAB — GLUCOSE, CAPILLARY
Glucose-Capillary: 290 mg/dL — ABNORMAL HIGH (ref 70–99)
Glucose-Capillary: 245 mg/dL — ABNORMAL HIGH (ref 70–99)
Glucose-Capillary: 244 mg/dL — ABNORMAL HIGH (ref 70–99)
Glucose-Capillary: 211 mg/dL — ABNORMAL HIGH (ref 70–99)
Glucose-Capillary: 256 mg/dL — ABNORMAL HIGH (ref 70–99)

## 2023-02-02 LAB — ECHOCARDIOGRAM COMPLETE
Area-P 1/2: 4.44 cm2
Height: 63 in
S' Lateral: 2.9 cm
Weight: 5358.06 oz

## 2023-02-02 LAB — RAPID URINE DRUG SCREEN, HOSP PERFORMED
Amphetamines: NOT DETECTED
Barbiturates: NOT DETECTED
Benzodiazepines: NOT DETECTED
Cocaine: POSITIVE — AB
Opiates: NOT DETECTED
Tetrahydrocannabinol: NOT DETECTED

## 2023-02-02 LAB — MAGNESIUM: Magnesium: 1.2 mg/dL — ABNORMAL LOW (ref 1.7–2.4)

## 2023-02-02 LAB — COMPREHENSIVE METABOLIC PANEL
ALT: 13 U/L (ref 0–44)
AST: 10 U/L — ABNORMAL LOW (ref 15–41)
Albumin: 2.5 g/dL — ABNORMAL LOW (ref 3.5–5.0)
Alkaline Phosphatase: 91 U/L (ref 38–126)
Anion gap: 11 (ref 5–15)
BUN: 18 mg/dL (ref 6–20)
CO2: 22 mmol/L (ref 22–32)
Calcium: 8.1 mg/dL — ABNORMAL LOW (ref 8.9–10.3)
Chloride: 104 mmol/L (ref 98–111)
Creatinine, Ser: 1.94 mg/dL — ABNORMAL HIGH (ref 0.44–1.00)
GFR, Estimated: 30 mL/min — ABNORMAL LOW (ref >60)
Glucose, Bld: 274 mg/dL — ABNORMAL HIGH (ref 70–99)
Potassium: 3.4 mmol/L — ABNORMAL LOW (ref 3.5–5.1)
Sodium: 137 mmol/L (ref 135–145)
Total Bilirubin: 0.4 mg/dL (ref 0.3–1.2)
Total Protein: 6.3 g/dL — ABNORMAL LOW (ref 6.5–8.1)

## 2023-02-02 LAB — CBC
HCT: 28.2 % — ABNORMAL LOW (ref 36.0–46.0)
Hemoglobin: 9.2 g/dL — ABNORMAL LOW (ref 12.0–15.0)
MCH: 27.5 pg (ref 26.0–34.0)
MCHC: 32.6 g/dL (ref 30.0–36.0)
MCV: 84.4 fL (ref 80.0–100.0)
Platelets: 263 10*3/uL (ref 150–400)
RBC: 3.34 MIL/uL — ABNORMAL LOW (ref 3.87–5.11)
RDW: 14.5 % (ref 11.5–15.5)
WBC: 7.6 10*3/uL (ref 4.0–10.5)
nRBC: 0 % (ref 0.0–0.2)

## 2023-02-02 LAB — LIPID PANEL
Cholesterol: 111 mg/dL (ref 0–200)
HDL: 36 mg/dL — ABNORMAL LOW (ref >40)
LDL Cholesterol: 49 mg/dL (ref 0–99)
Total CHOL/HDL Ratio: 3.1 RATIO
Triglycerides: 128 mg/dL (ref <150)
VLDL: 26 mg/dL (ref 0–40)

## 2023-02-02 LAB — PHOSPHORUS: Phosphorus: 4.4 mg/dL (ref 2.5–4.6)

## 2023-02-02 SURGERY — LEFT HEART CATH AND CORONARY ANGIOGRAPHY
Anesthesia: LOCAL

## 2023-02-02 MED ORDER — SODIUM CHLORIDE 0.9 % WEIGHT BASED INFUSION
3.0000 mL/kg/h | INTRAVENOUS | Status: DC
  Administered 2023-02-02: 3 mL/kg/h via INTRAVENOUS

## 2023-02-02 MED ORDER — SODIUM CHLORIDE 0.9% FLUSH: 3.0000 mL | INTRAVENOUS | Status: DC | PRN

## 2023-02-02 MED ORDER — PERFLUTREN LIPID MICROSPHERE
1.0000 mL | INTRAVENOUS | Status: AC | PRN
  Administered 2023-02-02: 2 mL via INTRAVENOUS

## 2023-02-02 MED ORDER — MIDAZOLAM HCL 2 MG/2ML IJ SOLN
INTRAMUSCULAR | Status: AC
  Filled 2023-02-02: qty 2

## 2023-02-02 MED ORDER — SODIUM CHLORIDE 0.9 % WEIGHT BASED INFUSION
1.0000 mL/kg/h | INTRAVENOUS | Status: DC
  Administered 2023-02-02: 1 mL/kg/h via INTRAVENOUS

## 2023-02-02 MED ORDER — SODIUM CHLORIDE 0.9 % IV SOLN: 250.0000 mL | INTRAVENOUS | Status: DC | PRN

## 2023-02-02 MED ORDER — SODIUM CHLORIDE 0.9 % WEIGHT BASED INFUSION: 1.0000 mL/kg/h | INTRAVENOUS | Status: DC

## 2023-02-02 MED ORDER — NICOTINE 14 MG/24HR TD PT24
14.0000 mg | MEDICATED_PATCH | Freq: Every day | TRANSDERMAL | Status: DC
  Administered 2023-02-03: 14 mg via TRANSDERMAL
  Filled 2023-02-02 (×2): qty 1

## 2023-02-02 MED ORDER — INSULIN ASPART 100 UNIT/ML IJ SOLN
0.0000 [IU] | INTRAMUSCULAR | Status: DC
  Administered 2023-02-02: 7 [IU] via SUBCUTANEOUS
  Administered 2023-02-02: 11 [IU] via SUBCUTANEOUS
  Administered 2023-02-02 (×2): 7 [IU] via SUBCUTANEOUS
  Administered 2023-02-03: 3 [IU] via SUBCUTANEOUS
  Administered 2023-02-03: 4 [IU] via SUBCUTANEOUS
  Administered 2023-02-03: 7 [IU] via SUBCUTANEOUS

## 2023-02-02 MED ORDER — VERAPAMIL HCL 2.5 MG/ML IV SOLN
INTRAVENOUS | Status: DC | PRN
  Administered 2023-02-02: 10 mL via INTRA_ARTERIAL

## 2023-02-02 MED ORDER — POTASSIUM CHLORIDE CRYS ER 20 MEQ PO TBCR: 40.0000 meq | EXTENDED_RELEASE_TABLET | Freq: Once | ORAL | Status: DC

## 2023-02-02 MED ORDER — FENTANYL CITRATE (PF) 100 MCG/2ML IJ SOLN
INTRAMUSCULAR | Status: DC | PRN
  Administered 2023-02-02: 25 ug via INTRAVENOUS

## 2023-02-02 MED ORDER — LABETALOL HCL 5 MG/ML IV SOLN: 10.0000 mg | INTRAVENOUS | Status: AC | PRN

## 2023-02-02 MED ORDER — MIDAZOLAM HCL 2 MG/2ML IJ SOLN
INTRAMUSCULAR | Status: DC | PRN
  Administered 2023-02-02: 1 mg via INTRAVENOUS

## 2023-02-02 MED ORDER — FENTANYL CITRATE (PF) 100 MCG/2ML IJ SOLN
INTRAMUSCULAR | Status: AC
  Filled 2023-02-02: qty 2

## 2023-02-02 MED ORDER — LIDOCAINE HCL (PF) 1 % IJ SOLN
INTRAMUSCULAR | Status: DC | PRN
  Administered 2023-02-02: 2 mL

## 2023-02-02 MED ORDER — MAGNESIUM SULFATE 2 GM/50ML IV SOLN
2.0000 g | Freq: Once | INTRAVENOUS | Status: AC
  Administered 2023-02-02: 2 g via INTRAVENOUS
  Filled 2023-02-02: qty 50

## 2023-02-02 MED ORDER — ONDANSETRON HCL 4 MG/2ML IJ SOLN: 4.0000 mg | Freq: Four times a day (QID) | INTRAMUSCULAR | Status: DC | PRN

## 2023-02-02 MED ORDER — IOHEXOL 350 MG/ML SOLN
INTRAVENOUS | Status: DC | PRN
  Administered 2023-02-02: 25 mL

## 2023-02-02 MED ORDER — SODIUM CHLORIDE 0.9 % WEIGHT BASED INFUSION: 3.0000 mL/kg/h | INTRAVENOUS | Status: DC

## 2023-02-02 MED ORDER — SODIUM CHLORIDE 0.9% FLUSH
3.0000 mL | Freq: Two times a day (BID) | INTRAVENOUS | Status: DC
  Administered 2023-02-02: 3 mL via INTRAVENOUS

## 2023-02-02 MED ORDER — SODIUM CHLORIDE 0.9% FLUSH
3.0000 mL | Freq: Two times a day (BID) | INTRAVENOUS | Status: DC
  Administered 2023-02-03: 3 mL via INTRAVENOUS

## 2023-02-02 MED ORDER — LIDOCAINE HCL (PF) 1 % IJ SOLN
INTRAMUSCULAR | Status: AC
  Filled 2023-02-02: qty 30

## 2023-02-02 MED ORDER — HEPARIN SODIUM (PORCINE) 1000 UNIT/ML IJ SOLN
INTRAMUSCULAR | Status: DC | PRN
  Administered 2023-02-02: 6000 [IU] via INTRAVENOUS

## 2023-02-02 MED ORDER — INSULIN GLARGINE-YFGN 100 UNIT/ML ~~LOC~~ SOLN
12.0000 [IU] | Freq: Every day | SUBCUTANEOUS | Status: DC
  Administered 2023-02-02 – 2023-02-03 (×2): 12 [IU] via SUBCUTANEOUS
  Filled 2023-02-02 (×2): qty 0.12

## 2023-02-02 MED ORDER — HYDRALAZINE HCL 20 MG/ML IJ SOLN: 10.0000 mg | INTRAMUSCULAR | Status: AC | PRN

## 2023-02-02 MED ORDER — HEPARIN (PORCINE) IN NACL 1000-0.9 UT/500ML-% IV SOLN
INTRAVENOUS | Status: DC | PRN
  Administered 2023-02-02 (×3): 500 mL

## 2023-02-02 MED ORDER — RIVAROXABAN 20 MG PO TABS
20.0000 mg | ORAL_TABLET | Freq: Every day | ORAL | Status: DC
  Administered 2023-02-03: 20 mg via ORAL
  Filled 2023-02-02: qty 1

## 2023-02-02 MED ORDER — OXYCODONE HCL 5 MG PO TABS
5.0000 mg | ORAL_TABLET | Freq: Four times a day (QID) | ORAL | Status: DC | PRN
  Administered 2023-02-02 – 2023-02-03 (×2): 5 mg via ORAL
  Filled 2023-02-02 (×2): qty 1

## 2023-02-02 MED ORDER — VERAPAMIL HCL 2.5 MG/ML IV SOLN
INTRAVENOUS | Status: AC
  Filled 2023-02-02: qty 2

## 2023-02-02 MED ORDER — HYDROMORPHONE HCL 1 MG/ML IJ SOLN
0.5000 mg | INTRAMUSCULAR | Status: DC | PRN
  Administered 2023-02-02: 0.5 mg via INTRAVENOUS
  Filled 2023-02-02: qty 0.5

## 2023-02-02 MED ORDER — SODIUM CHLORIDE 0.9 % IV SOLN: INTRAVENOUS | Status: AC

## 2023-02-02 MED ORDER — HEPARIN SODIUM (PORCINE) 5000 UNIT/ML IJ SOLN: 5000.0000 [IU] | Freq: Three times a day (TID) | INTRAMUSCULAR | Status: DC

## 2023-02-02 MED ORDER — HEPARIN SODIUM (PORCINE) 1000 UNIT/ML IJ SOLN
INTRAMUSCULAR | Status: AC
  Filled 2023-02-02: qty 10

## 2023-02-02 SURGICAL SUPPLY — 12 items
CATH 5FR JL3.5 JR4 ANG PIG MP (CATHETERS) IMPLANT
DEVICE RAD COMP TR BAND LRG (VASCULAR PRODUCTS) IMPLANT
ELECT DEFIB PAD ADLT CADENCE (PAD) IMPLANT
GLIDESHEATH SLEND SS 6F .021 (SHEATH) IMPLANT
GUIDEWIRE INQWIRE 1.5J.035X260 (WIRE) IMPLANT
INQWIRE 1.5J .035X260CM (WIRE) ×1
KIT HEART LEFT (KITS) ×1 IMPLANT
MAT PREVALON FULL STRYKER (MISCELLANEOUS) IMPLANT
PACK CARDIAC CATHETERIZATION (CUSTOM PROCEDURE TRAY) ×1 IMPLANT
SHEATH PROBE COVER 6X72 (BAG) IMPLANT
TRANSDUCER W/STOPCOCK (MISCELLANEOUS) ×1 IMPLANT
TUBING CIL FLEX 10 FLL-RA (TUBING) ×1 IMPLANT

## 2023-02-02 NOTE — ED Notes (Signed)
ED TO INPATIENT HANDOFF REPORT  ED Nurse Name and Phone #: Faythe Casa, RN [phone]  S Name/Age/Gender Hailey Klein 57 y.o. female Room/Bed: 015C/015C  Code Status   Code Status: Full Code  Home/SNF/Other Pt is currently homeless Patient oriented to: self, place, time, and situation Is this baseline? Yes   Triage Complete: Triage complete  Chief Complaint Chest pain [R07.9]  Triage Note Pt c/o non radiating midsternal chest pain started at 0400 today. Pt c/o right leg swellingx1wk. Pt c/o nausea. Pt denies vomiting or SOB. Pt has 1+ swelling of right leg. Pt has 2+ swelling of right foot. PT has 1+ right pedal pulse.    Allergies Allergies  Allergen Reactions   Aspirin Rash    Low doses ago    Level of Care/Admitting Diagnosis ED Disposition     ED Disposition  Admit   Condition  --   [place]: [place] [[id number]]  Level of Care: Telemetry Cardiac [103]  May place patient in observation at [facility] or [place] if equivalent level of care is available:: No  Covid Evaluation: Asymptomatic - no recent exposure (last 10 days) testing not required  Diagnosis: Chest pain [id number]  Admitting Physician: Kayleen Memos [id number]  Attending Physician: Kayleen Memos [id number]          B Medical/Surgery History Past Medical History:  Diagnosis Date   Diabetes mellitus without complication ([place])    H/O suicide attempt    cut wrists - 19-y-o   Hypertension    PTSD (post-traumatic stress disorder)    History reviewed. No pertinent surgical history.   A IV Location/Drains/Wounds Patient Lines/Drains/Airways Status     Active Line/Drains/Airways     Name Placement date Placement time Site Days   Peripheral IV 02/01/23 20 G 2.5" Right Antecubital 02/01/23  2223  Antecubital  1            Intake/Output Last 24 hours No intake or output data in the 24 hours ending 02/02/23 0006  Labs/Imaging Results for orders  placed or performed during the hospital encounter of 02/01/23 (from the past 48 hour(s))  Basic metabolic panel     Status: Abnormal   Collection Time: 02/01/23  1:43 PM  Result Value Ref Range   Sodium 134 (L) 135 - 145 mmol/L   Potassium 3.7 3.5 - 5.1 mmol/L   Chloride 100 98 - 111 mmol/L   CO2 22 22 - 32 mmol/L   Glucose, Bld 302 (H) 70 - 99 mg/dL    Comment: Glucose reference range applies only to samples taken after fasting for at least 8 hours.   BUN 13 6 - 20 mg/dL   Creatinine, Ser 1.65 (H) 0.44 - 1.00 mg/dL   Calcium 8.4 (L) 8.9 - 10.3 mg/dL   GFR, Estimated 36 (L) >60 mL/min    Comment: (NOTE) Calculated using the CKD-EPI Creatinine Equation (2021)    Anion gap 12 5 - 15    Comment: Performed at [facility] [address], [place],  [address]  CBC     Status: Abnormal   Collection Time: 02/01/23  1:43 PM  Result Value Ref Range   WBC 7.0 4.0 - 10.5 K/uL   RBC 3.79 (L) 3.87 - 5.11 MIL/uL   Hemoglobin 10.2 (L) 12.0 - 15.0 g/dL   HCT 32.6 (L) 36.0 - 46.0 %   MCV 86.0 80.0 - 100.0 fL   MCH 26.9 26.0 - 34.0 pg  MCHC 31.3 30.0 - 36.0 g/dL   RDW 14.4 11.5 - 15.5 %   Platelets 314 150 - 400 K/uL   nRBC 0.0 0.0 - 0.2 %    Comment: Performed at [facility], [place] [address] [place], [place] [address]  Troponin I (High Sensitivity)     Status: None   Collection Time: 02/01/23  1:43 PM  Result Value Ref Range   Troponin I (High Sensitivity) 10 <18 ng/L    Comment: (NOTE) Elevated high sensitivity troponin I (hsTnI) values and significant  changes across serial measurements may suggest ACS but many other  chronic and acute conditions are known to elevate hsTnI results.  Refer to the "Links" section for chest pain algorithms and additional  guidance. Performed at [facility], [place] [address], [place], [place] [address]   I-Stat beta hCG blood, ED     Status: None   Collection Time: 02/01/23  1:59 PM  Result Value Ref Range   I-stat hCG,  quantitative <5.0 <5 mIU/mL   Comment 3            Comment:   GEST. AGE      CONC.  (mIU/mL)   <=1 WEEK        5 - 50     2 WEEKS       50 - 500     3 WEEKS       100 - 10,000     4 WEEKS     1,000 - 30,000        FEMALE AND NON-PREGNANT FEMALE:     LESS THAN 5 mIU/mL   Troponin I (High Sensitivity)     Status: None   Collection Time: 02/01/23  4:36 PM  Result Value Ref Range   Troponin I (High Sensitivity) 11 <18 ng/L    Comment: (NOTE) Elevated high sensitivity troponin I (hsTnI) values and significant  changes across serial measurements may suggest ACS but many other  chronic and acute conditions are known to elevate hsTnI results.  Refer to the "Links" section for chest pain algorithms and additional  guidance. Performed at [facility], [place] [address] [place], [place] [address]    CT Angio Chest PE W and/or Wo Contrast  Result Date: 02/01/2023 CLINICAL DATA:  Midsternal chest pain. EXAM: CT ANGIOGRAPHY CHEST WITH CONTRAST TECHNIQUE: Multidetector CT imaging of the chest was performed using the standard protocol during bolus administration of intravenous contrast. Multiplanar CT image reconstructions and MIPs were obtained to evaluate the vascular anatomy. RADIATION DOSE REDUCTION: This exam was performed according to the departmental dose-optimization program which includes automated exposure control, adjustment of the mA and/or kV according to patient size and/or use of iterative reconstruction technique. CONTRAST:  58mL OMNIPAQUE IOHEXOL 350 MG/ML SOLN COMPARISON:  None Available. FINDINGS: Cardiovascular: The thoracic aorta is normal in appearance. The subsegmental pulmonary arteries are limited in evaluation secondary to areas of overlying artifact. No evidence of pulmonary embolism. Normal heart size. No pericardial effusion. Mediastinum/Nodes: No enlarged mediastinal, hilar, or axillary lymph nodes. Thyroid gland, trachea, and esophagus demonstrate no significant findings.  Lungs/Pleura: Mild atelectasis is seen within the posterior aspect of the bilateral lung bases. There is no evidence of an acute infiltrate, pleural effusion or pneumothorax. Upper Abdomen: No acute abnormality. Musculoskeletal: No chest wall abnormality. No acute or significant osseous findings. Review of the MIP images confirms the above findings. IMPRESSION: 1. No evidence of pulmonary embolism or other acute intrathoracic process.  Electronically Signed   By: Virgina Norfolk M.D.   On: 02/01/2023 23:19   VAS [place] LOWER EXTREMITY VENOUS (DVT) (7a-7p)  Result Date: 02/01/2023  Lower Venous DVT Study Patient Name:  TOMYA THOME  Date of Exam:   02/01/2023 Medical Rec #: [id number]        Accession #:    [id number] Date of Birth: [birth year]         Patient Gender: F Patient Age:   31 years Exam Location:  [facility] Procedure:      VAS [place] LOWER EXTREMITY VENOUS (DVT) Referring Phys: Cherlynn June --------------------------------------------------------------------------------  Indications: Swelling.  Risk Factors: None identified. Limitations: Body habitus and poor ultrasound/tissue interface. Comparison Study: No prior studies. Performing Technologist: Oliver Hum RVT  Examination Guidelines: A complete evaluation includes B-mode imaging, spectral Doppler, color Doppler, and power Doppler as needed of all accessible portions of each vessel. Bilateral testing is considered an integral part of a complete examination. Limited examinations for reoccurring indications may be performed as noted. The reflux portion of the exam is performed with the patient in reverse Trendelenburg.  +---------+---------------+---------+-----------+----------+-------------------+ RIGHT    CompressibilityPhasicitySpontaneityPropertiesThrombus Aging      +---------+---------------+---------+-----------+----------+-------------------+ CFV      Full           Yes      Yes                                       +---------+---------------+---------+-----------+----------+-------------------+ SFJ      Full                                                             +---------+---------------+---------+-----------+----------+-------------------+ FV Prox  Full                                                             +---------+---------------+---------+-----------+----------+-------------------+ FV Mid                  Yes      Yes                                      +---------+---------------+---------+-----------+----------+-------------------+ FV Distal               Yes      Yes                                      +---------+---------------+---------+-----------+----------+-------------------+ PFV      Full                                                             +---------+---------------+---------+-----------+----------+-------------------+ POP      Full  Yes      Yes                                      +---------+---------------+---------+-----------+----------+-------------------+ PTV      Full                                                             +---------+---------------+---------+-----------+----------+-------------------+ PERO                                                  Not well visualized +---------+---------------+---------+-----------+----------+-------------------+   +----+---------------+---------+-----------+----------+--------------+ LEFTCompressibilityPhasicitySpontaneityPropertiesThrombus Aging +----+---------------+---------+-----------+----------+--------------+ CFV Full           Yes      Yes                                 +----+---------------+---------+-----------+----------+--------------+    Summary: RIGHT: - There is no evidence of deep vein thrombosis in the lower extremity. However, portions of this examination were limited- see technologist comments above.  - No cystic structure found in the  popliteal fossa.  LEFT: - No evidence of common femoral vein obstruction.  *See table(s) above for measurements and observations. Electronically signed by Jamelle Haring on 02/01/2023 at 3:53:05 PM.    Final    DG Chest 2 View  Result Date: 02/01/2023 CLINICAL DATA:  Nonradiating midsternal chest pain since 0400 hours, RIGHT leg swelling for 1 week EXAM: CHEST - 2 VIEW COMPARISON:  08/10/2022 FINDINGS: Borderline enlargement of cardiac silhouette. Mediastinal contours and pulmonary vascularity normal. Lungs clear. No acute infiltrate, pleural effusion, or pneumothorax. Osseous structures unremarkable. IMPRESSION: No acute abnormalities. Electronically Signed   By: Lavonia Dana M.D.   On: 02/01/2023 14:09    Pending Labs Unresulted Labs (From admission, onward)     Start     Ordered   02/02/23 0500  Lipid panel  Tomorrow morning,   R        02/01/23 2354   02/02/23 0500  Hemoglobin A1c  Tomorrow morning,   R        02/01/23 2354   02/01/23 2359  Rapid urine drug screen (hospital performed)  ONCE - STAT,   STAT        02/01/23 2358            Vitals/Pain Today's Vitals   02/01/23 1947 02/01/23 2004 02/01/23 2224 02/01/23 2246  BP:    118/72  Pulse:    78  Resp:    18  Temp:    98.3 F (36.8 C)  TempSrc:    Oral  SpO2:    100%  Weight:      Height:      PainSc: 9  8  9       Isolation Precautions No active isolations  Medications Medications  rivaroxaban (XARELTO) tablet 20 mg (has no administration in time range)  mometasone-formoterol (DULERA) 200-5 MCG/ACT inhaler 2 puff (has no administration in time range)  montelukast (SINGULAIR) tablet 10 mg (has no administration in time range)  FLUoxetine (  PROZAC) capsule 40 mg (has no administration in time range)  atorvastatin (LIPITOR) tablet 80 mg (has no administration in time range)  aspirin EC tablet 81 mg (has no administration in time range)  acetaminophen (TYLENOL) tablet 650 mg (has no administration in time range)   polyethylene glycol (MIRALAX / GLYCOLAX) packet 17 g (has no administration in time range)  prochlorperazine (COMPAZINE) injection 5 mg (has no administration in time range)  melatonin tablet 5 mg (has no administration in time range)  oxyCODONE (Oxy IR/ROXICODONE) immediate release tablet 5 mg (5 mg Oral Given 02/01/23 2224)  iohexol (OMNIPAQUE) 350 MG/ML injection 75 mL (75 mLs Intravenous Contrast Given 02/01/23 2314)    Mobility walks with device     Focused Assessments Patient c/o chest discomfort and leg pain.  Alert and oriented x 3.     R Recommendations: See Admitting Provider Note  Report given to:   Additional Notes: Patient reports she recently became homeless after residing at a SNF.

## 2023-02-02 NOTE — Interval H&P Note (Signed)
History and Physical Interval Note:  02/02/2023 3:16 PM  Hailey Klein  has presented today for surgery, with the diagnosis of chest pain.  The various methods of treatment have been discussed with the patient and family. After consideration of risks, benefits and other options for treatment, the patient has consented to  Procedure(s): LEFT HEART CATH AND CORONARY ANGIOGRAPHY (N/A) as a surgical intervention.  The patient's history has been reviewed, patient examined, no change in status, stable for surgery.  I have reviewed the patient's chart and labs.  Questions were answered to the patient's satisfaction.     Sherren Mocha

## 2023-02-02 NOTE — Plan of Care (Signed)

## 2023-02-02 NOTE — Progress Notes (Signed)
S/p cath>>non-cardiac CP. Ok to resume Xarelto 3/23 AM per Dr. Burt Knack.  Onnie Boer, PharmD, BCIDP, AAHIVP, CPP Infectious Disease Pharmacist 02/02/2023 4:10 PM

## 2023-02-02 NOTE — Plan of Care (Signed)
  Problem: Education: Goal: Knowledge of General Education information will improve Description: Including pain rating scale, medication(s)/side effects and non-pharmacologic comfort measures Outcome: Progressing   Problem: Health Behavior/Discharge Planning: Goal: Ability to manage health-related needs will improve Outcome: Progressing   Problem: Clinical Measurements: Goal: Ability to maintain clinical measurements within normal limits will improve Outcome: Progressing Goal: Will remain free from infection Outcome: Progressing Goal: Diagnostic test results will improve Outcome: Progressing Goal: Respiratory complications will improve Outcome: Progressing Goal: Cardiovascular complication will be avoided Outcome: Progressing   Problem: Activity: Goal: Risk for activity intolerance will decrease Outcome: Progressing   Problem: Nutrition: Goal: Adequate nutrition will be maintained Outcome: Progressing   Problem: Coping: Goal: Level of anxiety will decrease Outcome: Progressing   Problem: Elimination: Goal: Will not experience complications related to bowel motility Outcome: Progressing Goal: Will not experience complications related to urinary retention Outcome: Progressing   Problem: Pain Managment: Goal: General experience of comfort will improve Outcome: Progressing   Problem: Safety: Goal: Ability to remain free from injury will improve Outcome: Progressing   Problem: Skin Integrity: Goal: Risk for impaired skin integrity will decrease Outcome: Progressing   Problem: Education: Goal: Ability to describe self-care measures that may prevent or decrease complications (Diabetes Survival Skills Education) will improve Outcome: Progressing Goal: Individualized Educational Video(s) Outcome: Progressing   Problem: Coping: Goal: Ability to adjust to condition or change in health will improve Outcome: Progressing   Problem: Fluid Volume: Goal: Ability to  maintain a balanced intake and output will improve Outcome: Progressing   Problem: Health Behavior/Discharge Planning: Goal: Ability to identify and utilize available resources and services will improve Outcome: Progressing Goal: Ability to manage health-related needs will improve Outcome: Progressing   Problem: Metabolic: Goal: Ability to maintain appropriate glucose levels will improve Outcome: Progressing   Problem: Nutritional: Goal: Maintenance of adequate nutrition will improve Outcome: Progressing Goal: Progress toward achieving an optimal weight will improve Outcome: Progressing   Problem: Skin Integrity: Goal: Risk for impaired skin integrity will decrease Outcome: Progressing   

## 2023-02-02 NOTE — Consult Note (Addendum)
Cardiology Consultation:   Patient ID: Brenyn Balchunas MRN: [id number]; DOB: 05-Apr-1966  Admit date: 02/01/2023 Date of Consult: 02/02/2023  Primary Care Provider: [facility] Primary Cardiologist: Janina Mayo, MD  Primary Electrophysiologist:  None    Patient Profile:   Laciana Picker is a 57 y.o. female with a hx of DM2, HTN, smoker who is being seen today for the evaluation of atypical chest pain at the request of emergency department.  History of Present Illness:   Ms. Obrion is a 57 year old female with a history of DM 2, HTN, and tobacco use who presents to the emergency department with atypical chest pain.  She also has severe morbid obesity with a BMI of 59 and HFpEF, CKD, and substance abuse.  Presented with new onset chest pain that started yesterday morning and has been persistent with associated shortness of breath.  Notably the patient has chronic shortness of breath.  On arrival to emergency department, her vitals were unremarkable as were her labs.  She received nitroglycerin with some improvement of her symptoms.  High-sensitivity troponin was normal x 2 and her ECG was unremarkable.  She also underwent CT scan for PE which was normal.  Past Medical History:  Diagnosis Date   Diabetes mellitus without complication ([place])    H/O suicide attempt    cut wrists - 19-y-o   Hypertension    PTSD (post-traumatic stress disorder)     History reviewed. No pertinent surgical history.   Home Medications:  Prior to Admission medications   Medication Sig Start Date End Date Taking? Authorizing Provider  acetaminophen (TYLENOL) 325 MG tablet Take 2 tablets (650 mg total) by mouth every 6 (six) hours as needed for mild pain. 12/09/15   Lindell Spar I, NP  amLODipine (NORVASC) 10 MG tablet Take 10 mg by mouth daily.    [provider]  ARIPiprazole (ABILIFY) 20 MG tablet Take 1 tablet (20 mg total) by mouth at bedtime. For mood control 12/09/15   Lindell Spar I,  NP  aspirin EC 81 MG tablet Take 81 mg by mouth daily. Swallow whole.    [provider]  atorvastatin (LIPITOR) 80 MG tablet Take 80 mg by mouth daily. 05/13/22   [provider]  BYDUREON BCISE 2 MG/0.85ML AUIJ Inject 2 mg into the skin every Tuesday. 05/13/22   [provider]  carvedilol (COREG) 12.5 MG tablet Take 12.5 mg by mouth 2 (two) times daily with a meal.    [provider]  empagliflozin (JARDIANCE) 10 MG TABS tablet Take 1 tablet (10 mg total) by mouth daily. 06/23/22   Arrien, Jimmy Picket, MD  FLUoxetine (PROZAC) 40 MG capsule Take 40 mg by mouth daily. 05/13/22   [provider]  fluticasone (FLONASE) 50 MCG/ACT nasal spray Place 1 spray into both nostrils daily. 05/13/22   [provider]  hydrOXYzine (ATARAX/VISTARIL) 25 MG tablet Take 1 tablet (25 mg total) by mouth every 6 (six) hours as needed for anxiety. 12/09/15   Lindell Spar I, NP  LANTUS SOLOSTAR 100 UNIT/ML Solostar Pen Inject 24 Units into the skin daily. 05/13/22   [provider]  losartan (COZAAR) 100 MG tablet Take 100 mg by mouth daily. 05/13/22   [provider]  metFORMIN (GLUCOPHAGE) 1000 MG tablet Take 1 tablet (1,000 mg total) by mouth 2 (two) times daily with a meal. For diabetes management 12/09/15   Lindell Spar I, NP  montelukast (SINGULAIR) 10 MG tablet Take 10 mg by mouth at bedtime.  05/13/22   [provider]  potassium chloride (KLOR-CON M) 10 MEQ tablet Take 10 mEq by mouth daily. 05/13/22   [provider]  pregabalin (LYRICA) 100 MG capsule Take 100 mg by mouth 2 (two) times daily.    [provider]  SYMBICORT 160-4.5 MCG/ACT inhaler Inhale 2 puffs into the lungs 2 (two) times daily. 05/13/22   [provider]  thiamine (VITAMIN B1) 100 MG tablet Take 100 mg by mouth 2 (two) times daily. 05/13/22   [provider]  torsemide (DEMADEX) 20 MG tablet Take 2 tablets (40 mg total) by mouth 2 (two) times  daily. TAKE 2 TABLETS (40mg ) BY MOUTH TWICE DAILY 12/19/22   Janina Mayo, MD  traZODone (DESYREL) 100 MG tablet Take 1 tablet (100 mg total) by mouth at bedtime. For insomnia 12/09/15   Lindell Spar I, NP  XARELTO 20 MG TABS tablet Take 20 mg by mouth. 05/13/22   [provider]    Inpatient Medications: Scheduled Meds:  aspirin EC  81 mg Oral Daily   atorvastatin  80 mg Oral Daily   FLUoxetine  40 mg Oral Daily   mometasone-formoterol  2 puff Inhalation BID   montelukast  10 mg Oral QHS   rivaroxaban  20 mg Oral Q supper   Continuous Infusions:  PRN Meds: acetaminophen, melatonin, polyethylene glycol, prochlorperazine  Allergies:    Allergies  Allergen Reactions   Aspirin Rash    Low doses ago    Social History:   Social History   Socioeconomic History   Marital status: Single    Spouse name: Not on file   Number of children: 0   Years of education: Not on file   Highest education level: High school graduate  Occupational History   Occupation: Disabilty  Tobacco Use   Smoking status: Every Day    Types: Cigarettes   Smokeless tobacco: Not on file  Vaping Use   Vaping Use: Never used  Substance and Sexual Activity   Alcohol use: Not Currently   Drug use: Not Currently    Types: Marijuana    Comment: 1 month ago   Sexual activity: Not on file  Other Topics Concern   Not on file  Social History Narrative   Not on file   Social Determinants of Health   Financial Resource Strain: Medium Risk (07/11/2022)   Overall Financial Resource Strain (CARDIA)    Difficulty of Paying Living Expenses: Somewhat hard  Food Insecurity: Food Insecurity Present (07/11/2022)   Hunger Vital Sign    Worried About [place] in the Last Year: Sometimes true    Ran Out of Food in the Last Year: Sometimes true  Transportation Needs: No Transportation Needs (07/11/2022)   PRAPARE - Hydrologist (Medical): No    Lack of Transportation  (Non-Medical): No  Physical Activity: Not on file  Stress: Not on file  Social Connections: Not on file  Intimate Partner Violence: Not on file    Family History:    Family History  Problem Relation Age of Onset   Alcoholism Other      Review of Systems: 12 point review of systems negative unless otherwise noted in the HPI  Physical Exam/Data:   Vitals:   02/01/23 1251 02/01/23 1616 02/01/23 1933 02/01/23 2246  BP: (!) 130/90 136/78 (!) 159/86 118/72  Pulse: 75 77 76 78  Resp: 20 17 20 18   Temp: 98.2 F (36.8 C) 98.2 F (  36.8 C) 98 F (36.7 C) 98.3 F (36.8 C)  TempSrc:   Oral Oral  SpO2: 98% 99% 100% 100%  Weight:      Height:       No intake or output data in the 24 hours ending 02/02/23 0032 Filed Weights   02/01/23 1251  Weight: (!) 156.5 kg   Body mass index is 59.22 kg/m.  General: No distress HEENT: normal Vascular: 2+ radial pulses bilaterally Cardiac:  normal S1, S2; RRR; no murmur  Lungs:  clear to auscultation bilaterally, no wheezing, rhonchi or rales  Abd: soft, nontender, no hepatomegaly  Ext: no edema Musculoskeletal:  No deformities, BUE and BLE strength normal and equal Skin: warm and dry  Neuro:  CNs 2-12 intact, no focal abnormalities noted Psych:  Normal affect   EKG:  The EKG was personally reviewed and demonstrates: Bifascicular block with LAFB and RBBB Telemetry:  Telemetry was personally reviewed and demonstrates: Sinus rhythm  Relevant CV Studies: Echo 06/2022:  1. Left ventricular ejection fraction, by estimation, is 50 to 55%. The  left ventricle has low normal function. The left ventricle demonstrates  global hypokinesis. Left ventricular diastolic parameters are  indeterminate.   2. Right ventricular systolic function is normal. The right ventricular  size is normal.   3. Left atrial size was moderately dilated.   4. The mitral valve is normal in structure. No evidence of mitral valve  regurgitation. No evidence of mitral  stenosis.   5. The aortic valve is normal in structure. There is mild calcification  of the aortic valve. There is mild thickening of the aortic valve. Aortic  valve regurgitation is not visualized. Aortic valve  sclerosis/calcification is present, without any  evidence of aortic stenosis.   6. The inferior vena cava is normal in size with greater than 50%  respiratory variability, suggesting right atrial pressure of 3 mmHg.   Laboratory Data:  Chemistry Recent Labs  Lab 02/01/23 1343  NA 134*  K 3.7  CL 100  CO2 22  GLUCOSE 302*  BUN 13  CREATININE 1.65*  CALCIUM 8.4*  GFRNONAA 36*  ANIONGAP 12    No results for input(s): "PROT", "ALBUMIN", "AST", "ALT", "ALKPHOS", "BILITOT" in the last 168 hours. Hematology Recent Labs  Lab 02/01/23 1343  WBC 7.0  RBC 3.79*  HGB 10.2*  HCT 32.6*  MCV 86.0  MCH 26.9  MCHC 31.3  RDW 14.4  PLT 314   Cardiac EnzymesNo results for input(s): "TROPONINI" in the last 168 hours. No results for input(s): "TROPIPOC" in the last 168 hours.  BNPNo results for input(s): "BNP", "PROBNP" in the last 168 hours.  DDimer No results for input(s): "DDIMER" in the last 168 hours.  Radiology/Studies:  CT Angio Chest PE W and/or Wo Contrast  Result Date: 02/01/2023 CLINICAL DATA:  Midsternal chest pain. EXAM: CT ANGIOGRAPHY CHEST WITH CONTRAST TECHNIQUE: Multidetector CT imaging of the chest was performed using the standard protocol during bolus administration of intravenous contrast. Multiplanar CT image reconstructions and MIPs were obtained to evaluate the vascular anatomy. RADIATION DOSE REDUCTION: This exam was performed according to the departmental dose-optimization program which includes automated exposure control, adjustment of the mA and/or kV according to patient size and/or use of iterative reconstruction technique. CONTRAST:  62mL OMNIPAQUE IOHEXOL 350 MG/ML SOLN COMPARISON:  None Available. FINDINGS: Cardiovascular: The thoracic aorta is  normal in appearance. The subsegmental pulmonary arteries are limited in evaluation secondary to areas of overlying artifact. No evidence of pulmonary embolism. Normal  heart size. No pericardial effusion. Mediastinum/Nodes: No enlarged mediastinal, hilar, or axillary lymph nodes. Thyroid gland, trachea, and esophagus demonstrate no significant findings. Lungs/Pleura: Mild atelectasis is seen within the posterior aspect of the bilateral lung bases. There is no evidence of an acute infiltrate, pleural effusion or pneumothorax. Upper Abdomen: No acute abnormality. Musculoskeletal: No chest wall abnormality. No acute or significant osseous findings. Review of the MIP images confirms the above findings. IMPRESSION: 1. No evidence of pulmonary embolism or other acute intrathoracic process. Electronically Signed   By: Virgina Norfolk M.D.   On: 02/01/2023 23:19   VAS [place] LOWER EXTREMITY VENOUS (DVT) (7a-7p)  Result Date: 02/01/2023  Lower Venous DVT Study Patient Name:  JUSTIS ATEN  Date of Exam:   02/01/2023 Medical Rec #: [id number]        Accession #:    [id number] Date of Birth: [birth year]         Patient Gender: F Patient Age:   29 years Exam Location:  [facility] Procedure:      VAS [place] LOWER EXTREMITY VENOUS (DVT) Referring Phys: Cherlynn June --------------------------------------------------------------------------------  Indications: Swelling.  Risk Factors: None identified. Limitations: Body habitus and poor ultrasound/tissue interface. Comparison Study: No prior studies. Performing Technologist: Oliver Hum RVT  Examination Guidelines: A complete evaluation includes B-mode imaging, spectral Doppler, color Doppler, and power Doppler as needed of all accessible portions of each vessel. Bilateral testing is considered an integral part of a complete examination. Limited examinations for reoccurring indications may be performed as noted. The reflux portion of the exam is performed with the patient  in reverse Trendelenburg.  +---------+---------------+---------+-----------+----------+-------------------+ RIGHT    CompressibilityPhasicitySpontaneityPropertiesThrombus Aging      +---------+---------------+---------+-----------+----------+-------------------+ CFV      Full           Yes      Yes                                      +---------+---------------+---------+-----------+----------+-------------------+ SFJ      Full                                                             +---------+---------------+---------+-----------+----------+-------------------+ FV Prox  Full                                                             +---------+---------------+---------+-----------+----------+-------------------+ FV Mid                  Yes      Yes                                      +---------+---------------+---------+-----------+----------+-------------------+ FV Distal               Yes      Yes                                      +---------+---------------+---------+-----------+----------+-------------------+  PFV      Full                                                             +---------+---------------+---------+-----------+----------+-------------------+ POP      Full           Yes      Yes                                      +---------+---------------+---------+-----------+----------+-------------------+ PTV      Full                                                             +---------+---------------+---------+-----------+----------+-------------------+ PERO                                                  Not well visualized +---------+---------------+---------+-----------+----------+-------------------+   +----+---------------+---------+-----------+----------+--------------+ LEFTCompressibilityPhasicitySpontaneityPropertiesThrombus Aging +----+---------------+---------+-----------+----------+--------------+ CFV  Full           Yes      Yes                                 +----+---------------+---------+-----------+----------+--------------+    Summary: RIGHT: - There is no evidence of deep vein thrombosis in the lower extremity. However, portions of this examination were limited- see technologist comments above.  - No cystic structure found in the popliteal fossa.  LEFT: - No evidence of common femoral vein obstruction.  *See table(s) above for measurements and observations. Electronically signed by Jamelle Haring on 02/01/2023 at 3:53:05 PM.    Final    DG Chest 2 View  Result Date: 02/01/2023 CLINICAL DATA:  Nonradiating midsternal chest pain since 0400 hours, RIGHT leg swelling for 1 week EXAM: CHEST - 2 VIEW COMPARISON:  08/10/2022 FINDINGS: Borderline enlargement of cardiac silhouette. Mediastinal contours and pulmonary vascularity normal. Lungs clear. No acute infiltrate, pleural effusion, or pneumothorax. Osseous structures unremarkable. IMPRESSION: No acute abnormalities. Electronically Signed   By: Lavonia Dana M.D.   On: 02/01/2023 14:09    Assessment and Plan:   Atypical chest pain.  She had a unremarkable workup in the emergency department and a heart score of 3.  Low likelihood that she has progressive coronary disease causing her symptoms.  Given her BMI, stress testing will also be challenging.  I would monitor her symptoms overnight and repeat her echo in the morning.  If her echo is unchanged then her chest pain workup can be continued as an outpatient.  Strongly encouraged avoidance of substance use which could be contributing to her symptoms as well.    For questions or updates, please contact [place] Please consult [email or web address] for contact info under     Signed, Doyne Keel, MD  02/02/2023 12:32 AM  Personally seen and examined. Agree with above.  57 year old female with morbid  obesity BMI 59 with diabetes hypertension smoker uncontrolled diabetes here with  chest discomfort.  Currently she is laying in bed getting her echocardiogram.  On first glance the echocardiogram appears to demonstrate normal ejection fraction.  She however is still feeling chest discomfort, described as a pressure and throbbing-like sensation left side of chest wall.  Thankfully troponins were normal.  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  CT scan showed no evidence of pulmonary embolism.   She is not a good candidate for noninvasive study such as nuclear stress test given her body habitus.  We would not get reasonable results.  Personally reviewed and interpreted her PE CT and did notice coronary calcification noted in the circumflex artery.  The study was not gated for cardiac/coronary evaluation.    Given her ongoing chest discomfort, anginal symptoms, throbbing-like sensation, we will go ahead and proceed with cardiac catheterization via the right radial artery approach.  Risks and benefits of been explained including stroke heart attack death renal impairment bleeding.  She is willing to proceed.  Significant other at bedside for discussion.  Recommend continued statin therapy as well as low-dose aspirin for secondary prevention.  She has eaten breakfast around 8:30 AM.  She is currently n.p.o.  Candee Furbish, MD

## 2023-02-02 NOTE — Evaluation (Signed)
Occupational Therapy Evaluation Patient Details Name: Hailey Klein MRN: [id number] DOB: 1966/04/08 Today's Date: 02/02/2023   History of Present Illness 57 yo female admitted 3/21 with atypical chest pain positive for cocaine. PMhx: T2DM, HTN, smoker, CKD, gout, obesity, HTN, depression, PTSD   Clinical Impression   Patient is currently requiring assistance with ADLs including moderate to maximum assist with Lower body ADLs, up to minimal assist with Upper body ADLs,  as well as  minimal assist with bed mobility and up to minimal assist of 2 assist with functional transfers to toilet.  Current level of function is close to patient's typical baseline, however pt showing potential for increased independence to decrease caregiver burden.   During this evaluation, patient was limited by generalized weakness, impaired activity tolerance, body habitus, and pain, all of which has the potential to impact patient's safety and independence during functional mobility, as well as performance for ADLs.  Patient lives with her Boyfriend, who is able to provide close to 24/7 supervision and assistance.  Patient demonstrates good rehab potential, and should benefit from continued skilled occupational therapy services while in acute care to maximize safety, independence and quality of life at home.  Continued occupational therapy services is recommended.  ?      Recommendations for follow up therapy are one component of a multi-disciplinary discharge planning process, led by the attending physician.  Recommendations may be updated based on patient status, additional functional criteria and insurance authorization.   Follow Up Recommendations  No OT follow up     Assistance Recommended at Discharge Intermittent Supervision/Assistance  Patient can return home with the following A little help with walking and/or transfers;Help with stairs or ramp for entrance;Assistance with cooking/housework;A little help with  bathing/dressing/bathroom    Functional Status Assessment  Patient has had a recent decline in their functional status and demonstrates the ability to make significant improvements in function in a reasonable and predictable amount of time.  Equipment Recommendations  Other (comment) (Wide 2 wheeled RW. adaptive equipment for ADLs: reacher, sock aid, long bath brush, long shoe horn)    Recommendations for Other Services       Precautions / Restrictions Precautions Precautions: Fall Precaution Comments: h/o RT knee buckle Restrictions Weight Bearing Restrictions: No      Mobility Bed Mobility Overal bed mobility: Needs Assistance Bed Mobility: Supine to Sit     Supine to sit: Min assist     General bed mobility comments: bed flat without rail boyfriend assisting to lift trunk and move legs to EOB which is baseline    Transfers                          Balance Overall balance assessment: History of Falls, Needs assistance Sitting-balance support: No upper extremity supported, Feet supported Sitting balance-Leahy Scale: Good     Standing balance support: Reliant on assistive device for balance, During functional activity Standing balance-Leahy Scale: Fair                             ADL either performed or assessed with clinical judgement   ADL Overall ADL's : Needs assistance/impaired Eating/Feeding: Independent   Grooming: Set up;Wash/dry hands;Sitting   Upper Body Bathing: Sitting;Minimal assistance Upper Body Bathing Details (indicate cue type and reason): Min As for thoroughness Lower Body Bathing: Moderate assistance;Sit to/from stand;Sitting/lateral leans   Upper Body Dressing : Set up;Sitting   Lower  Body Dressing: Moderate assistance;Sitting/lateral leans;Sit to/from stand Lower Body Dressing Details (indicate cue type and reason): Baseline assisted with socks, shoes and getting clothing over feet, with potential to increase  independence with AE. Toilet Transfer: BSC/3in1;Rolling walker (2 wheels);Cueing for sequencing;Ambulation;Min guard;Minimal assistance;+2 for physical assistance Toilet Transfer Details (indicate cue type and reason): Pt stood from EOB to RW with Min As of 2. Pt ambluated with RW to bathroom and descended to [facility] atop toilet with cues for grab bar and Min Guard. Stood from [facility] with [employer] of one. Ambulated back to EOB with Min guard assist and cues for sequencing due to h/o RT LE buckle. Toileting- Clothing Manipulation and Hygiene: Maximal assistance;Sit to/from stand Toileting - Clothing Manipulation Details (indicate cue type and reason): Max As for hygiene. Pt and S.O. educated on possible bidet options.     Functional mobility during ADLs: Minimal assistance;Min guard;Cueing for sequencing;Rolling walker (2 wheels)       Vision   Vision Assessment?: No apparent visual deficits     Perception     Praxis      Pertinent Vitals/Pain Pain Assessment Pain Assessment: 0-10 Pain Score: 8  Pain Location: RLE and chest Pain Descriptors / Indicators: Aching, Guarding Pain Intervention(s): Limited activity within patient's tolerance, Monitored during session, Premedicated before session, Repositioned     Hand Dominance     Extremity/Trunk Assessment Upper Extremity Assessment Upper Extremity Assessment: Generalized weakness   Lower Extremity Assessment Lower Extremity Assessment: Generalized weakness (grossly 2/5)   Cervical / Trunk Assessment Cervical / Trunk Assessment: Other exceptions Cervical / Trunk Exceptions: increased body habitus   Communication Communication Communication: No difficulties   Cognition Arousal/Alertness: Lethargic Behavior During Therapy: WFL for tasks assessed/performed Overall Cognitive Status: Within Functional Limits for tasks assessed                                 General Comments: More alert once EOB     General  Comments       Exercises     Shoulder Instructions      Home Living Family/patient expects to be discharged to:: Private residence Living Arrangements: Spouse/significant other Available Help at Discharge: Family;Available PRN/intermittently Type of Home: Apartment Home Access: Stairs to enter Entrance Stairs-Number of Steps: 3 Entrance Stairs-Rails: Right;Left;Can reach both Home Layout: One level     Bathroom Shower/Tub: Tub/shower unit;Sponge bathes at baseline   [employer]: Standard     Home Equipment: Conservation officer, nature (2 wheels);BSC/3in1          Prior Functioning/Environment Prior Level of Function : Needs assist             Mobility Comments: Uses RW at baseline - household distances ADLs Comments: Patient's boyfriend assists with lower body ADL, meal prep, home management and community mobility.        OT Problem List: Impaired balance (sitting and/or standing);Pain;Obesity;Decreased strength;Decreased activity tolerance;Cardiopulmonary status limiting activity;Decreased knowledge of use of DME or AE      OT Treatment/Interventions: Self-care/ADL training;Therapeutic activities;Energy conservation;Patient/family education;Balance training;DME and/or AE instruction    OT Goals(Current goals can be found in the care plan section) Acute Rehab OT Goals Patient Stated Goal: Increase ability to care for self OT Goal Formulation: With patient/family Time For Goal Achievement: 02/16/23 Potential to Achieve Goals: Good ADL Goals Pt Will Perform Grooming: with supervision;standing Pt Will Perform Lower Body Bathing: with adaptive equipment;with caregiver independent in assisting;sitting/lateral leans;sit to/from  stand Pt Will Perform Lower Body Dressing: with adaptive equipment;sit to/from stand;sitting/lateral leans;with min guard assist Pt Will Transfer to Toilet: with modified independence;ambulating Pt Will Perform Toileting - Clothing Manipulation and  hygiene: with adaptive equipment;with min guard assist;sitting/lateral leans;sit to/from stand  OT Frequency: Min 2X/week    Co-evaluation              AM-PAC OT "6 Clicks" Daily Activity     Outcome Measure Help from another person eating meals?: None Help from another person taking care of personal grooming?: A Little Help from another person toileting, which includes using toliet, bedpan, or urinal?: A Lot Help from another person bathing (including washing, rinsing, drying)?: A Lot Help from another person to put on and taking off regular upper body clothing?: A Little Help from another person to put on and taking off regular lower body clothing?: A Lot 6 Click Score: 16   End of Session Equipment Utilized During Treatment: Gait belt;Rolling walker (2 wheels) Nurse Communication: Other (comment) (Pt sitting EOB with S.O present. Urine collected in HAT)  Activity Tolerance: Patient tolerated treatment well Patient left: in bed;with call bell/phone within reach;with family/visitor present  OT Visit Diagnosis: History of falling (Z91.81);Pain                Time: [id number] OT Time Calculation (min): 24 min Charges:  OT General Charges $OT Visit: 1 Visit OT Evaluation $OT Eval Low Complexity: 1 Low OT Treatments $Self Care/Home Management : 8-22 mins  Anderson Malta, OT Acute Rehab Services Office: [phone] 02/02/2023  Julien Girt 02/02/2023, 11:33 AM

## 2023-02-02 NOTE — Progress Notes (Signed)
Echocardiogram 2D Echocardiogram has been performed.  Oneal Deputy Robt Okuda RDCS 02/02/2023, 10:07 AM

## 2023-02-02 NOTE — H&P (View-Only) (Signed)
Cardiology Consultation:   Patient ID: Maisha Kappus MRN: [id number]; DOB: 1966/02/19  Admit date: 02/01/2023 Date of Consult: 02/02/2023  Primary Care Provider: [facility] Primary Cardiologist: Janina Mayo, MD  Primary Electrophysiologist:  None    Patient Profile:   Keertana Bulls is a 57 y.o. female with a hx of DM2, HTN, smoker who is being seen today for the evaluation of atypical chest pain at the request of emergency department.  History of Present Illness:   Ms. Cimini is a 57 year old female with a history of DM 2, HTN, and tobacco use who presents to the emergency department with atypical chest pain.  She also has severe morbid obesity with a BMI of 59 and HFpEF, CKD, and substance abuse.  Presented with new onset chest pain that started yesterday morning and has been persistent with associated shortness of breath.  Notably the patient has chronic shortness of breath.  On arrival to emergency department, her vitals were unremarkable as were her labs.  She received nitroglycerin with some improvement of her symptoms.  High-sensitivity troponin was normal x 2 and her ECG was unremarkable.  She also underwent CT scan for PE which was normal.  Past Medical History:  Diagnosis Date   Diabetes mellitus without complication ([place])    H/O suicide attempt    cut wrists - 19-y-o   Hypertension    PTSD (post-traumatic stress disorder)     History reviewed. No pertinent surgical history.   Home Medications:  Prior to Admission medications   Medication Sig Start Date End Date Taking? Authorizing Provider  acetaminophen (TYLENOL) 325 MG tablet Take 2 tablets (650 mg total) by mouth every 6 (six) hours as needed for mild pain. 12/09/15   Lindell Spar I, NP  amLODipine (NORVASC) 10 MG tablet Take 10 mg by mouth daily.    [provider]  ARIPiprazole (ABILIFY) 20 MG tablet Take 1 tablet (20 mg total) by mouth at bedtime. For mood control 12/09/15   Lindell Spar I,  NP  aspirin EC 81 MG tablet Take 81 mg by mouth daily. Swallow whole.    [provider]  atorvastatin (LIPITOR) 80 MG tablet Take 80 mg by mouth daily. 05/13/22   [provider]  BYDUREON BCISE 2 MG/0.85ML AUIJ Inject 2 mg into the skin every Tuesday. 05/13/22   [provider]  carvedilol (COREG) 12.5 MG tablet Take 12.5 mg by mouth 2 (two) times daily with a meal.    [provider]  empagliflozin (JARDIANCE) 10 MG TABS tablet Take 1 tablet (10 mg total) by mouth daily. 06/23/22   Arrien, Jimmy Picket, MD  FLUoxetine (PROZAC) 40 MG capsule Take 40 mg by mouth daily. 05/13/22   [provider]  fluticasone (FLONASE) 50 MCG/ACT nasal spray Place 1 spray into both nostrils daily. 05/13/22   [provider]  hydrOXYzine (ATARAX/VISTARIL) 25 MG tablet Take 1 tablet (25 mg total) by mouth every 6 (six) hours as needed for anxiety. 12/09/15   Lindell Spar I, NP  LANTUS SOLOSTAR 100 UNIT/ML Solostar Pen Inject 24 Units into the skin daily. 05/13/22   [provider]  losartan (COZAAR) 100 MG tablet Take 100 mg by mouth daily. 05/13/22   [provider]  metFORMIN (GLUCOPHAGE) 1000 MG tablet Take 1 tablet (1,000 mg total) by mouth 2 (two) times daily with a meal. For diabetes management 12/09/15   Lindell Spar I, NP  montelukast (SINGULAIR) 10 MG tablet Take 10 mg by mouth at bedtime.  05/13/22   [provider]  potassium chloride (KLOR-CON M) 10 MEQ tablet Take 10 mEq by mouth daily. 05/13/22   [provider]  pregabalin (LYRICA) 100 MG capsule Take 100 mg by mouth 2 (two) times daily.    [provider]  SYMBICORT 160-4.5 MCG/ACT inhaler Inhale 2 puffs into the lungs 2 (two) times daily. 05/13/22   [provider]  thiamine (VITAMIN B1) 100 MG tablet Take 100 mg by mouth 2 (two) times daily. 05/13/22   [provider]  torsemide (DEMADEX) 20 MG tablet Take 2 tablets (40 mg total) by mouth 2 (two) times  daily. TAKE 2 TABLETS (40mg ) BY MOUTH TWICE DAILY 12/19/22   Janina Mayo, MD  traZODone (DESYREL) 100 MG tablet Take 1 tablet (100 mg total) by mouth at bedtime. For insomnia 12/09/15   Lindell Spar I, NP  XARELTO 20 MG TABS tablet Take 20 mg by mouth. 05/13/22   [provider]    Inpatient Medications: Scheduled Meds:  aspirin EC  81 mg Oral Daily   atorvastatin  80 mg Oral Daily   FLUoxetine  40 mg Oral Daily   mometasone-formoterol  2 puff Inhalation BID   montelukast  10 mg Oral QHS   rivaroxaban  20 mg Oral Q supper   Continuous Infusions:  PRN Meds: acetaminophen, melatonin, polyethylene glycol, prochlorperazine  Allergies:    Allergies  Allergen Reactions   Aspirin Rash    Low doses ago    Social History:   Social History   Socioeconomic History   Marital status: Single    Spouse name: Not on file   Number of children: 0   Years of education: Not on file   Highest education level: High school graduate  Occupational History   Occupation: Disabilty  Tobacco Use   Smoking status: Every Day    Types: Cigarettes   Smokeless tobacco: Not on file  Vaping Use   Vaping Use: Never used  Substance and Sexual Activity   Alcohol use: Not Currently   Drug use: Not Currently    Types: Marijuana    Comment: 1 month ago   Sexual activity: Not on file  Other Topics Concern   Not on file  Social History Narrative   Not on file   Social Determinants of Health   Financial Resource Strain: Medium Risk (07/11/2022)   Overall Financial Resource Strain (CARDIA)    Difficulty of Paying Living Expenses: Somewhat hard  Food Insecurity: Food Insecurity Present (07/11/2022)   Hunger Vital Sign    Worried About [place] in the Last Year: Sometimes true    Ran Out of Food in the Last Year: Sometimes true  Transportation Needs: No Transportation Needs (07/11/2022)   PRAPARE - Hydrologist (Medical): No    Lack of Transportation  (Non-Medical): No  Physical Activity: Not on file  Stress: Not on file  Social Connections: Not on file  Intimate Partner Violence: Not on file    Family History:    Family History  Problem Relation Age of Onset   Alcoholism Other      Review of Systems: 12 point review of systems negative unless otherwise noted in the HPI  Physical Exam/Data:   Vitals:   02/01/23 1251 02/01/23 1616 02/01/23 1933 02/01/23 2246  BP: (!) 130/90 136/78 (!) 159/86 118/72  Pulse: 75 77 76 78  Resp: 20 17 20 18   Temp: 98.2 F (36.8 C) 98.2 F (  36.8 C) 98 F (36.7 C) 98.3 F (36.8 C)  TempSrc:   Oral Oral  SpO2: 98% 99% 100% 100%  Weight:      Height:       No intake or output data in the 24 hours ending 02/02/23 0032 Filed Weights   02/01/23 1251  Weight: (!) 156.5 kg   Body mass index is 59.22 kg/m.  General: No distress HEENT: normal Vascular: 2+ radial pulses bilaterally Cardiac:  normal S1, S2; RRR; no murmur  Lungs:  clear to auscultation bilaterally, no wheezing, rhonchi or rales  Abd: soft, nontender, no hepatomegaly  Ext: no edema Musculoskeletal:  No deformities, BUE and BLE strength normal and equal Skin: warm and dry  Neuro:  CNs 2-12 intact, no focal abnormalities noted Psych:  Normal affect   EKG:  The EKG was personally reviewed and demonstrates: Bifascicular block with LAFB and RBBB Telemetry:  Telemetry was personally reviewed and demonstrates: Sinus rhythm  Relevant CV Studies: Echo 06/2022:  1. Left ventricular ejection fraction, by estimation, is 50 to 55%. The  left ventricle has low normal function. The left ventricle demonstrates  global hypokinesis. Left ventricular diastolic parameters are  indeterminate.   2. Right ventricular systolic function is normal. The right ventricular  size is normal.   3. Left atrial size was moderately dilated.   4. The mitral valve is normal in structure. No evidence of mitral valve  regurgitation. No evidence of mitral  stenosis.   5. The aortic valve is normal in structure. There is mild calcification  of the aortic valve. There is mild thickening of the aortic valve. Aortic  valve regurgitation is not visualized. Aortic valve  sclerosis/calcification is present, without any  evidence of aortic stenosis.   6. The inferior vena cava is normal in size with greater than 50%  respiratory variability, suggesting right atrial pressure of 3 mmHg.   Laboratory Data:  Chemistry Recent Labs  Lab 02/01/23 1343  NA 134*  K 3.7  CL 100  CO2 22  GLUCOSE 302*  BUN 13  CREATININE 1.65*  CALCIUM 8.4*  GFRNONAA 36*  ANIONGAP 12    No results for input(s): "PROT", "ALBUMIN", "AST", "ALT", "ALKPHOS", "BILITOT" in the last 168 hours. Hematology Recent Labs  Lab 02/01/23 1343  WBC 7.0  RBC 3.79*  HGB 10.2*  HCT 32.6*  MCV 86.0  MCH 26.9  MCHC 31.3  RDW 14.4  PLT 314   Cardiac EnzymesNo results for input(s): "TROPONINI" in the last 168 hours. No results for input(s): "TROPIPOC" in the last 168 hours.  BNPNo results for input(s): "BNP", "PROBNP" in the last 168 hours.  DDimer No results for input(s): "DDIMER" in the last 168 hours.  Radiology/Studies:  CT Angio Chest PE W and/or Wo Contrast  Result Date: 02/01/2023 CLINICAL DATA:  Midsternal chest pain. EXAM: CT ANGIOGRAPHY CHEST WITH CONTRAST TECHNIQUE: Multidetector CT imaging of the chest was performed using the standard protocol during bolus administration of intravenous contrast. Multiplanar CT image reconstructions and MIPs were obtained to evaluate the vascular anatomy. RADIATION DOSE REDUCTION: This exam was performed according to the departmental dose-optimization program which includes automated exposure control, adjustment of the mA and/or kV according to patient size and/or use of iterative reconstruction technique. CONTRAST:  32mL OMNIPAQUE IOHEXOL 350 MG/ML SOLN COMPARISON:  None Available. FINDINGS: Cardiovascular: The thoracic aorta is  normal in appearance. The subsegmental pulmonary arteries are limited in evaluation secondary to areas of overlying artifact. No evidence of pulmonary embolism. Normal  heart size. No pericardial effusion. Mediastinum/Nodes: No enlarged mediastinal, hilar, or axillary lymph nodes. Thyroid gland, trachea, and esophagus demonstrate no significant findings. Lungs/Pleura: Mild atelectasis is seen within the posterior aspect of the bilateral lung bases. There is no evidence of an acute infiltrate, pleural effusion or pneumothorax. Upper Abdomen: No acute abnormality. Musculoskeletal: No chest wall abnormality. No acute or significant osseous findings. Review of the MIP images confirms the above findings. IMPRESSION: 1. No evidence of pulmonary embolism or other acute intrathoracic process. Electronically Signed   By: Virgina Norfolk M.D.   On: 02/01/2023 23:19   VAS [place] LOWER EXTREMITY VENOUS (DVT) (7a-7p)  Result Date: 02/01/2023  Lower Venous DVT Study Patient Name:  PEIGHTON GARTENBERG  Date of Exam:   02/01/2023 Medical Rec #: [id number]        Accession #:    [id number] Date of Birth: January 26, 1966         Patient Gender: F Patient Age:   33 years Exam Location:  [facility] Procedure:      VAS [place] LOWER EXTREMITY VENOUS (DVT) Referring Phys: Cherlynn June --------------------------------------------------------------------------------  Indications: Swelling.  Risk Factors: None identified. Limitations: Body habitus and poor ultrasound/tissue interface. Comparison Study: No prior studies. Performing Technologist: Oliver Hum RVT  Examination Guidelines: A complete evaluation includes B-mode imaging, spectral Doppler, color Doppler, and power Doppler as needed of all accessible portions of each vessel. Bilateral testing is considered an integral part of a complete examination. Limited examinations for reoccurring indications may be performed as noted. The reflux portion of the exam is performed with the patient  in reverse Trendelenburg.  +---------+---------------+---------+-----------+----------+-------------------+ RIGHT    CompressibilityPhasicitySpontaneityPropertiesThrombus Aging      +---------+---------------+---------+-----------+----------+-------------------+ CFV      Full           Yes      Yes                                      +---------+---------------+---------+-----------+----------+-------------------+ SFJ      Full                                                             +---------+---------------+---------+-----------+----------+-------------------+ FV Prox  Full                                                             +---------+---------------+---------+-----------+----------+-------------------+ FV Mid                  Yes      Yes                                      +---------+---------------+---------+-----------+----------+-------------------+ FV Distal               Yes      Yes                                      +---------+---------------+---------+-----------+----------+-------------------+  PFV      Full                                                             +---------+---------------+---------+-----------+----------+-------------------+ POP      Full           Yes      Yes                                      +---------+---------------+---------+-----------+----------+-------------------+ PTV      Full                                                             +---------+---------------+---------+-----------+----------+-------------------+ PERO                                                  Not well visualized +---------+---------------+---------+-----------+----------+-------------------+   +----+---------------+---------+-----------+----------+--------------+ LEFTCompressibilityPhasicitySpontaneityPropertiesThrombus Aging +----+---------------+---------+-----------+----------+--------------+ CFV  Full           Yes      Yes                                 +----+---------------+---------+-----------+----------+--------------+    Summary: RIGHT: - There is no evidence of deep vein thrombosis in the lower extremity. However, portions of this examination were limited- see technologist comments above.  - No cystic structure found in the popliteal fossa.  LEFT: - No evidence of common femoral vein obstruction.  *See table(s) above for measurements and observations. Electronically signed by Jamelle Haring on 02/01/2023 at 3:53:05 PM.    Final    DG Chest 2 View  Result Date: 02/01/2023 CLINICAL DATA:  Nonradiating midsternal chest pain since 0400 hours, RIGHT leg swelling for 1 week EXAM: CHEST - 2 VIEW COMPARISON:  08/10/2022 FINDINGS: Borderline enlargement of cardiac silhouette. Mediastinal contours and pulmonary vascularity normal. Lungs clear. No acute infiltrate, pleural effusion, or pneumothorax. Osseous structures unremarkable. IMPRESSION: No acute abnormalities. Electronically Signed   By: Lavonia Dana M.D.   On: 02/01/2023 14:09    Assessment and Plan:   Atypical chest pain.  She had a unremarkable workup in the emergency department and a heart score of 3.  Low likelihood that she has progressive coronary disease causing her symptoms.  Given her BMI, stress testing will also be challenging.  I would monitor her symptoms overnight and repeat her echo in the morning.  If her echo is unchanged then her chest pain workup can be continued as an outpatient.  Strongly encouraged avoidance of substance use which could be contributing to her symptoms as well.    For questions or updates, please contact [place] Please consult [email or web address] for contact info under     Signed, Doyne Keel, MD  02/02/2023 12:32 AM  Personally seen and examined. Agree with above.  57 year old female with morbid  obesity BMI 59 with diabetes hypertension smoker uncontrolled diabetes here with  chest discomfort.  Currently she is laying in bed getting her echocardiogram.  On first glance the echocardiogram appears to demonstrate normal ejection fraction.  She however is still feeling chest discomfort, described as a pressure and throbbing-like sensation left side of chest wall.  Thankfully troponins were normal.  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  CT scan showed no evidence of pulmonary embolism.   She is not a good candidate for noninvasive study such as nuclear stress test given her body habitus.  We would not get reasonable results.  Personally reviewed and interpreted her PE CT and did notice coronary calcification noted in the circumflex artery.  The study was not gated for cardiac/coronary evaluation.    Given her ongoing chest discomfort, anginal symptoms, throbbing-like sensation, we will go ahead and proceed with cardiac catheterization via the right radial artery approach.  Risks and benefits of been explained including stroke heart attack death renal impairment bleeding.  She is willing to proceed.  Significant other at bedside for discussion.  Recommend continued statin therapy as well as low-dose aspirin for secondary prevention.  She has eaten breakfast around 8:30 AM.  She is currently n.p.o.  Candee Furbish, MD

## 2023-02-02 NOTE — Progress Notes (Signed)
PROGRESS NOTE    Hailey Klein  [id number] DOB: August 05, 1966 DOA: 02/01/2023 PCP: [facility]     Brief Narrative:  Hailey Klein is a 57 y.o. 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  It has been persistent since its onset.  The pain is sharp, substernal, nonradiating.  Associated with shortness of breath.   In the ED, the patient received a nitroglycerin with improvement of her symptoms.  Also complaining of right lower extremity edema and pain.  Bilateral lower extremity Doppler ultrasound was negative for DVT.  CT PE study was also negative.  High-sensitivity troponin was negative x 2.  No evidence of acute ischemia on twelve-lead EKG.  The patient was going to be discharged to follow-up with a cardiologist however she requested admission for stress test.  Cardiology was consulted.    New events last 24 hours / Subjective: Continues to have chest pain, rates it 9/10 this morning. CP is throbbing pressure in nature.   Assessment & Plan:   Principal Problem:   Chest pain Active Problems:   Type 2 diabetes mellitus with hyperlipidemia (HCC)   Cocaine abuse (HCC)   Renal vein thrombosis (HCC)   Hypokalemia   Hypomagnesemia   Chest pain -Trop negative x 2  -Echo pending -Cardiology following; planning heart cath today -Aspirin, statin   DM type 2 -Ha1c pending  -Semglee, SSI  CKD stage 3b -Baseline Cr 1.93 -Stable   Hx renal vein thrombosis -Xarelto on hold  Depression -Fluoxetine  Cocaine use -Cessation counseling   Hypokalemia -Replace   Hypomagnesemia -Replace   DVT prophylaxis: Xarelto on hole   Code Status: Full Family  Communication: None at bedside  Disposition Plan:  Status is: Inpatient Remains inpatient appropriate because: heart cath today   Antimicrobials:  Anti-infectives (From admission, onward)    None        Objective: Vitals:   02/02/23 0114 02/02/23 0700 02/02/23 1006 02/02/23 1046  BP: 128/85 138/66  124/69  Pulse: 82 70 85 75  Resp: 20 17  17   Temp: 97.7 F (36.5 C) 97.7 F (36.5 C)  97.7 F (36.5 C)  TempSrc: Oral Oral  Oral  SpO2: 95% 93%  95%  Weight: (!) 151.9 kg     Height: 5\' 3"  (1.6 m)       Intake/Output Summary (Last 24 hours) at 02/02/2023 1253 Last data filed at 02/02/2023 0800 Gross per 24 hour  Intake 200 ml  Output 200 ml  Net 0 ml   Filed Weights   02/01/23 1251 02/02/23 0114  Weight: (!) 156.5 kg (!) 151.9 kg    Examination:  General exam: Appears calm and comfortable  Respiratory system: Clear to auscultation. Respiratory effort normal. No respiratory distress. No conversational dyspnea.  Cardiovascular system: S1 & S2 heard, RRR. No murmurs. No pedal edema. Gastrointestinal system: Abdomen is nondistended, soft and nontender. Normal bowel sounds heard. Central nervous system: Alert and oriented. No focal neurological deficits. Speech clear.  Extremities: Symmetric in appearance  Skin: No rashes, lesions or ulcers on exposed skin  Psychiatry: Judgement and insight appear normal. Mood & affect appropriate.   Data Reviewed: I have personally reviewed following labs and imaging studies  CBC: Recent Labs  Lab 02/01/23 1343 02/02/23 0451  WBC 7.0 7.6  HGB 10.2* 9.2*  HCT 32.6* 28.2*  MCV 86.0 84.4  PLT 314 [id number]   Basic Metabolic Panel: Recent Labs  Lab 02/01/23 1343 02/02/23 0451  NA 134* 137  K 3.7 3.4*  CL 100 104  CO2 22 22  GLUCOSE 302* 274*  BUN 13 18  CREATININE 1.65* 1.94*  CALCIUM 8.4* 8.1*  MG  --  1.2*  PHOS  --  4.4   GFR: Estimated Creatinine Clearance: 47.7 mL/min (A) (by C-G formula based on SCr of 1.94 mg/dL  (H)). Liver Function Tests: Recent Labs  Lab 02/02/23 0451  AST 10*  ALT 13  ALKPHOS 91  BILITOT 0.4  PROT 6.3*  ALBUMIN 2.5*   No results for input(s): "LIPASE", "AMYLASE" in the last 168 hours. No results for input(s): "AMMONIA" in the last 168 hours. Coagulation Profile: No results for input(s): "INR", "PROTIME" in the last 168 hours. Cardiac Enzymes: No results for input(s): "CKTOTAL", "CKMB", "CKMBINDEX", "TROPONINI" in the last 168 hours. BNP (last 3 results) No results for input(s): "PROBNP" in the last 8760 hours. HbA1C: No results for input(s): "HGBA1C" in the last 72 hours. CBG: Recent Labs  Lab 02/02/23 0115 02/02/23 0428 02/02/23 1123  GLUCAP 290* 245* 244*   Lipid Profile: Recent Labs    02/02/23 0451  CHOL 111  HDL 36*  LDLCALC 49  TRIG 128  CHOLHDL 3.1   Thyroid Function Tests: No results for input(s): "TSH", "T4TOTAL", "FREET4", "T3FREE", "THYROIDAB" in the last 72 hours. Anemia Panel: No results for input(s): "VITAMINB12", "FOLATE", "FERRITIN", "TIBC", "IRON", "RETICCTPCT" in the last 72 hours. Sepsis Labs: No results for input(s): "PROCALCITON", "LATICACIDVEN" in the last 168 hours.  Recent Results (from the past 240 hour(s))  MRSA Next Gen by PCR, Nasal     Status: None   Collection Time: 02/02/23  2:52 AM   Specimen: Nasal Mucosa; Nasal Swab  Result Value Ref Range Status   MRSA by PCR Next Gen NOT DETECTED NOT DETECTED Final    Comment: (NOTE) The GeneXpert MRSA Assay (FDA approved for NASAL specimens only), is one component of a comprehensive MRSA colonization surveillance program. It is not intended to diagnose MRSA infection nor to guide or monitor treatment for MRSA infections. Test performance is not FDA approved in patients less than 55 years old. Performed at [facility], [place] [address], [place],  [address]       Radiology Studies: CT Angio Chest PE W and/or Wo Contrast  Result Date: 02/01/2023 CLINICAL  DATA:  Midsternal chest pain. EXAM: CT ANGIOGRAPHY CHEST WITH CONTRAST TECHNIQUE: Multidetector CT imaging of the chest was performed using the standard protocol during bolus administration of intravenous contrast. Multiplanar CT image reconstructions and MIPs were obtained to evaluate the vascular anatomy. RADIATION DOSE REDUCTION: This exam was performed according to the departmental dose-optimization program which includes automated exposure control, adjustment of the mA and/or kV according to patient size and/or use of iterative reconstruction technique. CONTRAST:  49mL OMNIPAQUE IOHEXOL 350 MG/ML SOLN COMPARISON:  None Available. FINDINGS: Cardiovascular: The thoracic aorta is normal in appearance. The subsegmental pulmonary arteries are limited in evaluation secondary to areas of overlying artifact. No evidence of pulmonary embolism. Normal heart size. No pericardial effusion. Mediastinum/Nodes: No enlarged mediastinal, hilar, or axillary lymph nodes. Thyroid gland, trachea, and esophagus demonstrate no significant findings. Lungs/Pleura: Mild atelectasis is seen within the posterior aspect of the bilateral lung bases. There is no evidence of  an acute infiltrate, pleural effusion or pneumothorax. Upper Abdomen: No acute abnormality. Musculoskeletal: No chest wall abnormality. No acute or significant osseous findings. Review of the MIP images confirms the above findings. IMPRESSION: 1. No evidence of pulmonary embolism or other acute intrathoracic process. Electronically Signed   By: Virgina Norfolk M.D.   On: 02/01/2023 23:19   VAS [place] LOWER EXTREMITY VENOUS (DVT) (7a-7p)  Result Date: 02/01/2023  Lower Venous DVT Study Patient Name:  MELVENE ESKOLA  Date of Exam:   02/01/2023 Medical Rec #: [id number]        Accession #:    [id number] Date of Birth: [birth year]         Patient Gender: F Patient Age:   64 years Exam Location:  [facility] Procedure:      VAS [place] LOWER EXTREMITY VENOUS (DVT) Referring  Phys: Cherlynn June --------------------------------------------------------------------------------  Indications: Swelling.  Risk Factors: None identified. Limitations: Body habitus and poor ultrasound/tissue interface. Comparison Study: No prior studies. Performing Technologist: Oliver Hum RVT  Examination Guidelines: A complete evaluation includes B-mode imaging, spectral Doppler, color Doppler, and power Doppler as needed of all accessible portions of each vessel. Bilateral testing is considered an integral part of a complete examination. Limited examinations for reoccurring indications may be performed as noted. The reflux portion of the exam is performed with the patient in reverse Trendelenburg.  +---------+---------------+---------+-----------+----------+-------------------+ RIGHT    CompressibilityPhasicitySpontaneityPropertiesThrombus Aging      +---------+---------------+---------+-----------+----------+-------------------+ CFV      Full           Yes      Yes                                      +---------+---------------+---------+-----------+----------+-------------------+ SFJ      Full                                                             +---------+---------------+---------+-----------+----------+-------------------+ FV Prox  Full                                                             +---------+---------------+---------+-----------+----------+-------------------+ FV Mid                  Yes      Yes                                      +---------+---------------+---------+-----------+----------+-------------------+ FV Distal               Yes      Yes                                      +---------+---------------+---------+-----------+----------+-------------------+ PFV      Full                                                             +---------+---------------+---------+-----------+----------+-------------------+  POP      Full            Yes      Yes                                      +---------+---------------+---------+-----------+----------+-------------------+ PTV      Full                                                             +---------+---------------+---------+-----------+----------+-------------------+ PERO                                                  Not well visualized +---------+---------------+---------+-----------+----------+-------------------+   +----+---------------+---------+-----------+----------+--------------+ LEFTCompressibilityPhasicitySpontaneityPropertiesThrombus Aging +----+---------------+---------+-----------+----------+--------------+ CFV Full           Yes      Yes                                 +----+---------------+---------+-----------+----------+--------------+    Summary: RIGHT: - There is no evidence of deep vein thrombosis in the lower extremity. However, portions of this examination were limited- see technologist comments above.  - No cystic structure found in the popliteal fossa.  LEFT: - No evidence of common femoral vein obstruction.  *See table(s) above for measurements and observations. Electronically signed by Jamelle Haring on 02/01/2023 at 3:53:05 PM.    Final    DG Chest 2 View  Result Date: 02/01/2023 CLINICAL DATA:  Nonradiating midsternal chest pain since 0400 hours, RIGHT leg swelling for 1 week EXAM: CHEST - 2 VIEW COMPARISON:  08/10/2022 FINDINGS: Borderline enlargement of cardiac silhouette. Mediastinal contours and pulmonary vascularity normal. Lungs clear. No acute infiltrate, pleural effusion, or pneumothorax. Osseous structures unremarkable. IMPRESSION: No acute abnormalities. Electronically Signed   By: Lavonia Dana M.D.   On: 02/01/2023 14:09      Scheduled Meds:  aspirin EC  81 mg Oral Daily   atorvastatin  80 mg Oral Daily   FLUoxetine  40 mg Oral Daily   insulin aspart  0-20 Units Subcutaneous Q4H   insulin glargine-yfgn   12 Units Subcutaneous Daily   mometasone-formoterol  2 puff Inhalation BID   montelukast  10 mg Oral QHS   nicotine  14 mg Transdermal Daily   potassium chloride  40 mEq Oral Once   sodium chloride flush  3 mL Intravenous Q12H   Continuous Infusions:  sodium chloride     sodium chloride 1 mL/kg/hr (02/02/23 1129)     LOS: 0 days   Time spent: 35 minutes   Dessa Phi, DO [facility] 02/02/2023, 12:53 PM   Available via Epic secure chat 7am-7pm After these hours, please refer to coverage provider listed on [email or web address]

## 2023-02-02 NOTE — Inpatient Diabetes Management (Addendum)
Inpatient Diabetes Program Recommendations  AACE/ADA: New Consensus Statement on Inpatient Glycemic Control (2015)  Target Ranges:  Prepandial:   less than 140 mg/dL      Peak postprandial:   less than 180 mg/dL (1-2 hours)      Critically ill patients:  140 - 180 mg/dL   Lab Results  Component Value Date   GLUCAP 245 (H) 02/02/2023   HGBA1C 12.7 (H) 06/20/2022    Latest Reference Range & Units 02/02/23 01:15 02/02/23 04:28  Glucose-Capillary 70 - 99 mg/dL 290 (H) 245 (H)  (H): Data is abnormally high  Diabetes history: DM2 Outpatient Diabetes medications: bydureon 2 mg weekly, jardiance 10 mg daily, Lantus 24 units daily, Metformin 1000 mg BID  Current orders for Inpatient glycemic control: Novolog 0-15 units q 4 hrs.  Inpatient Diabetes Program Recommendations:   Please consider: -Add Semglee 12 units now and qd (50% home basal dose)  Thank you, Bethena Roys E. Jariya Reichow, RN, MSN, CDE  Diabetes Coordinator Inpatient Glycemic Control Team Team Pager [phone] (8am-5pm) 02/02/2023 10:29 AM

## 2023-02-02 NOTE — Evaluation (Signed)
Physical Therapy Evaluation Patient Details Name: Hailey Klein MRN: [id number] DOB: 1966-03-13 Today's Date: 02/02/2023  History of Present Illness  57 yo female admitted 3/21 with atypical chest pain positive for cocaine. PMhx: T2DM, HTN, smoker, CKD, gout, obesity, HTN, depression, PTSD  Clinical Impression  Pt reports having RLE abscess requiring surgery at [facility] in October and after that [facility] and has been home about 3 weeks. Boyfriend assists with transfers OOB, performs the homemaking and assists with stairs. Pt reports she has pain in her legs that tylenol doesn't manage thus her use of cocaine and chest pain resulting after its use. Pt also reports decreased activity since leaving SNF. Pt and boyfriend encouraged to have pt maximize functional activity by having her attempt transfers before asking for assist, walking to bathroom and increasing gait trials during the day. Pt with decreased strength and function who will benefit from acute therapy for education and activity progression.   HR 77-85 SPO2 >95% on RA, no change in pain with activity     Recommendations for follow up therapy are one component of a multi-disciplinary discharge planning process, led by the attending physician.  Recommendations may be updated based on patient status, additional functional criteria and insurance authorization.  Follow Up Recommendations Home health PT      Assistance Recommended at Discharge Intermittent Supervision/Assistance  Patient can return home with the following  A little help with walking and/or transfers;A little help with bathing/dressing/bathroom;Help with stairs or ramp for entrance;Assistance with cooking/housework    Equipment Recommendations None recommended by PT  Recommendations for Other Services       Functional Status Assessment Patient has had a recent decline in their functional status and/or demonstrates limited ability to make significant  improvements in function in a reasonable and predictable amount of time     Precautions / Restrictions Precautions Precautions: Fall Restrictions Weight Bearing Restrictions: No      Mobility  Bed Mobility Overal bed mobility: Needs Assistance Bed Mobility: Supine to Sit     Supine to sit: Min assist     General bed mobility comments: bed flat without rail boyfriend assisting to lift trunk and move legs to EOB which is baseline    Transfers Overall transfer level: Needs assistance   Transfers: Sit to/from Stand Sit to Stand: Min guard, Min assist           General transfer comment: min assist to rise from bed and minguard to rise from chair with armrests with cues for scooting to edge of surface, hand placement and sequence    Ambulation/Gait Ambulation/Gait assistance: Min guard Gait Distance (Feet): 15 Feet Assistive device: Rolling walker (2 wheels) Gait Pattern/deviations: Step-through pattern, Decreased stance time - left, Wide base of support, Trunk flexed   Gait velocity interpretation: <1.31 ft/sec, indicative of household ambulator   General Gait Details: pt walked 6' then reported need for seated rest due to RLE feeling as if it would buckle. Pt then able to stand and ambulate 15' with cues for proximity to [employer]    Modified Rankin (Stroke Patients Only)       Balance Overall balance assessment: History of Falls  Pertinent Vitals/Pain Pain Assessment Pain Assessment: 0-10 Pain Score: 8  Pain Location: RLE and chest Pain Descriptors / Indicators: Aching, Guarding Pain Intervention(s): Monitored during session, RN gave pain meds during session, Limited activity within patient's tolerance, Repositioned    Home Living Family/patient expects to be discharged to:: Private residence Living Arrangements: Spouse/significant other Available Help at  Discharge: Family;Available PRN/intermittently Type of Home: Apartment Home Access: Stairs to enter Entrance Stairs-Rails: Right;Left;Can reach both Entrance Stairs-Number of Steps: 3   Home Layout: One level Home Equipment: Conservation officer, nature (2 wheels);BSC/3in1      Prior Function Prior Level of Function : Needs assist             Mobility Comments: Uses RW at baseline - household distances ADLs Comments: Patient's boyfriend assists with lower body ADL, meal prep, home management and community mobility.     Hand Dominance        Extremity/Trunk Assessment   Upper Extremity Assessment Upper Extremity Assessment: Generalized weakness    Lower Extremity Assessment Lower Extremity Assessment: Generalized weakness (grossly 2/5)    Cervical / Trunk Assessment Cervical / Trunk Assessment: Other exceptions Cervical / Trunk Exceptions: increased body habitus  Communication   Communication: No difficulties  Cognition Arousal/Alertness: Awake/alert Behavior During Therapy: WFL for tasks assessed/performed Overall Cognitive Status: Within Functional Limits for tasks assessed                                          General Comments      Exercises     Assessment/Plan    PT Assessment Patient needs continued PT services  PT Problem List Decreased strength;Decreased mobility;Decreased activity tolerance;Decreased balance;Decreased knowledge of use of DME;Pain       PT Treatment Interventions DME instruction;Therapeutic activities;Gait training;Functional mobility training;Therapeutic exercise;Stair training;Patient/family education    PT Goals (Current goals can be found in the Care Plan section)  Acute Rehab PT Goals Patient Stated Goal: return home PT Goal Formulation: With patient/family Time For Goal Achievement: 02/16/23 Potential to Achieve Goals: Fair    Frequency Min 1X/week     Co-evaluation               AM-PAC PT "6 Clicks"  Mobility  Outcome Measure Help needed turning from your back to your side while in a flat bed without using bedrails?: A Little Help needed moving from lying on your back to sitting on the side of a flat bed without using bedrails?: A Little Help needed moving to and from a bed to a chair (including a wheelchair)?: A Little Help needed standing up from a chair using your arms (e.g., wheelchair or bedside chair)?: A Little Help needed to walk in hospital room?: A Lot Help needed climbing 3-5 steps with a railing? : Total 6 Click Score: 15    End of Session   Activity Tolerance: Patient tolerated treatment well Patient left: in chair;with call bell/phone within reach;with family/visitor present Nurse Communication: Mobility status PT Visit Diagnosis: Other abnormalities of gait and mobility (R26.89);Muscle weakness (generalized) (M62.81)    Time: 0832-0900 PT Time Calculation (min) (ACUTE ONLY): 28 min   Charges:   PT Evaluation $PT Eval Moderate Complexity: 1 Mod PT Treatments $Therapeutic Activity: 8-22 mins        Bayard Males, PT Acute Rehabilitation Services Office: [phone]   Sandy Salaam Jaysie Benthall 02/02/2023, 10:20 AM

## 2023-02-03 ENCOUNTER — Inpatient Hospital Stay (HOSPITAL_COMMUNITY): Payer: Medicaid Other

## 2023-02-03 DIAGNOSIS — R079 Chest pain, unspecified: Secondary | ICD-10-CM | POA: Diagnosis not present

## 2023-02-03 LAB — GLUCOSE, CAPILLARY
Glucose-Capillary: 114 mg/dL — ABNORMAL HIGH (ref 70–99)
Glucose-Capillary: 144 mg/dL — ABNORMAL HIGH (ref 70–99)
Glucose-Capillary: 185 mg/dL — ABNORMAL HIGH (ref 70–99)
Glucose-Capillary: 216 mg/dL — ABNORMAL HIGH (ref 70–99)

## 2023-02-03 LAB — BASIC METABOLIC PANEL
Anion gap: 7 (ref 5–15)
BUN: 23 mg/dL — ABNORMAL HIGH (ref 6–20)
CO2: 23 mmol/L (ref 22–32)
Calcium: 7.7 mg/dL — ABNORMAL LOW (ref 8.9–10.3)
Chloride: 108 mmol/L (ref 98–111)
Creatinine, Ser: 1.69 mg/dL — ABNORMAL HIGH (ref 0.44–1.00)
GFR, Estimated: 35 mL/min — ABNORMAL LOW (ref >60)
Glucose, Bld: 111 mg/dL — ABNORMAL HIGH (ref 70–99)
Potassium: 3.6 mmol/L (ref 3.5–5.1)
Sodium: 138 mmol/L (ref 135–145)

## 2023-02-03 LAB — HEMOGLOBIN A1C
Hgb A1c MFr Bld: 7.6 % — ABNORMAL HIGH (ref 4.8–5.6)
Mean Plasma Glucose: 171 mg/dL

## 2023-02-03 LAB — MAGNESIUM: Magnesium: 1.8 mg/dL (ref 1.7–2.4)

## 2023-02-03 NOTE — Progress Notes (Signed)
Mobility Specialist Progress Note:   02/03/23 1053  Mobility  Activity Ambulated with assistance in hallway  Level of Assistance Minimal assist, patient does 75% or more  Assistive Device Front wheel walker  Distance Ambulated (ft) 50 ft  Activity Response Tolerated well  $Mobility charge 1 Mobility   Pt in bed willing to participate in mobility. No complaints of pain. MinA to stand then contact guard to ambulate. Left in bed with call bell in reach and all needs met.   Gareth Eagle Shameika Speelman Mobility Specialist Please contact via [employer] or  Rehab Office at [phone]

## 2023-02-03 NOTE — Progress Notes (Signed)
   Patient Name: Lyllie Lograsso Date of Encounter: 02/03/2023 [place] Cardiologist: Janina Mayo, MD   Interval Summary   She says that she cannot wiggle her toes.  No new chest pain or SOB.   Vital Signs   Vitals:   02/03/23 0417 02/03/23 0721 02/03/23 0825 02/03/23 0933  BP: 114/74 (!) 149/91  128/73  Pulse: 78 80  79  Resp: 13 15  20   Temp: 98.6 F (37 C) 98.7 F (37.1 C)    TempSrc: Oral Oral    SpO2: 98% 97% 97% 97%  Weight:      Height:        Intake/Output Summary (Last 24 hours) at 02/03/2023 1005 Last data filed at 02/03/2023 0300 Gross per 24 hour  Intake 1651.55 ml  Output 300 ml  Net 1351.55 ml      02/02/2023    1:14 AM 02/01/2023   12:51 PM 01/02/2023    2:45 PM  Last 3 Weights  Weight (lbs) 334 lb 14.1 oz 345 lb 0.3 oz 345 lb  Weight (kg) 151.9 kg 156.5 kg 156.491 kg      Telemetry/ECG    NSR - Personally Reviewed  Physical Exam  GEN: No acute distress.   Neck: No JVD Cardiac: RRR, no murmurs, rubs, or gallops.  Respiratory: Clear to auscultation bilaterally. GI: Soft, nontender, non-distended  MS: No edema.  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    Echo with normal LV function.    CKD:    Creat down compared to yesterday.  No change in therapy.    No further cardiac work up or suggestions.  Please call with further questions.    For questions or updates, please contact [place] Please consult [email or web address] for contact info under        Signed, Minus Breeding, MD

## 2023-02-03 NOTE — Discharge Summary (Addendum)
Physician Discharge Summary  Hailey Klein [id number] DOB: [birth year] DOA: 02/01/2023  PCP: [facility] [place] date: 02/01/2023 Discharge date: 02/03/2023  Admitted From: Home Disposition:  Home   Recommendations for Outpatient Follow-up:  Follow up with PCP in 1 week Discussed cocaine cessation  Discharge Condition: Stable CODE STATUS: Full code Diet recommendation: Heart healthy diet  Brief/Interim Summary:  Hailey Klein is a 57 y.o. 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  It has been persistent since its onset.  The pain is sharp, substernal, nonradiating.  Associated with shortness of breath.   In the ED, the patient received a nitroglycerin with improvement of her symptoms.  Also complaining of right lower extremity edema and pain.  Bilateral lower extremity Doppler ultrasound was negative for DVT.  CT PE study was also negative.  High-sensitivity troponin was negative x 2.  No evidence of acute ischemia on twelve-lead EKG.  The patient was going to be discharged to follow-up with a cardiologist however she requested admission for stress test.  Cardiology was consulted.    Patient underwent heart cath on 3/23 which revealed nonobstructive CAD.  Echo with normal LV function.  Patient worked with physical therapy who recommended home health, however due to Medicaid status, patient was referred for outpatient PT instead.  Discharge Diagnoses:   Principal Problem:   Chest pain at rest Active Problems:   Type 2 diabetes mellitus with hyperlipidemia (HCC)   Cocaine abuse ([place])   Renal vein thrombosis (HCC)   Hypokalemia    Hypomagnesemia   CAD -Trop negative x 2  -Heart cath with nonobstructive CAD -Aspirin, Lipitor  Hypertension -Coreg, hydrochlorothiazide, Cozaar, Demadex, Norvasc   DM type 2 -Ha1c 7.6 -Lantus, SSI, Jardiance   CKD stage 3b -Baseline Cr 1.93 -Stable    Hx renal vein thrombosis -Xarelto   Mood disorder -Fluoxetine, Abilify, Atarax as needed, trazodone   Cocaine use -Cessation counseling    Hypokalemia -Replace   Discharge Instructions  Discharge Instructions     Ambulatory referral to Physical Therapy   Complete by: As directed    Call MD for:  difficulty breathing, headache or visual disturbances   Complete by: As directed    Call MD for:  extreme fatigue   Complete by: As directed    Call MD for:  persistant dizziness or light-headedness   Complete by: As directed    Call MD for:  persistant nausea and vomiting   Complete by: As directed    Call MD for:  severe uncontrolled pain   Complete by: As directed    Call MD for:  temperature >100.4   Complete by: As directed    Diet - low sodium heart healthy   Complete by: As directed    Diet Carb Modified   Complete by: As directed    Discharge instructions   Complete by: As directed    You were cared for by a hospitalist during your hospital stay. If you have any questions about your discharge medications or the care you received while you were in the hospital after you are discharged, you can call the unit and ask to speak with the hospitalist on call if the hospitalist that took care of  you is not available. Once you are discharged, your primary care physician will handle any further medical issues. Please note that NO REFILLS for any discharge medications will be authorized once you are discharged, as it is imperative that you return to your primary care physician (or establish a relationship with a primary care physician if you do not have one) for your aftercare needs so that they can reassess your need for  medications and monitor your lab values.   Increase activity slowly   Complete by: As directed       Allergies as of 02/03/2023       Reactions   Aspirin Rash   high doses ago        Medication List     STOP taking these medications    Chloraseptic Sore Throat 6-10 MG lozenge Generic drug: benzocaine-menthol   Eliquis 5 MG Tabs tablet Generic drug: apixaban   metFORMIN 1000 MG tablet Commonly known as: GLUCOPHAGE       TAKE these medications    acetaminophen 325 MG tablet Commonly known as: TYLENOL Take 2 tablets (650 mg total) by mouth every 6 (six) hours as needed for mild pain.   amLODipine 10 MG tablet Commonly known as: NORVASC Take 10 mg by mouth daily.   ARIPiprazole 5 MG tablet Commonly known as: ABILIFY Take 5 mg by mouth daily. What changed: Another medication with the same name was removed. Continue taking this medication, and follow the directions you see here.   aspirin EC 81 MG tablet Take 81 mg by mouth daily. Swallow whole.   atorvastatin 80 MG tablet Commonly known as: LIPITOR Take 80 mg by mouth daily.   Bydureon BCise 2 MG/0.85ML Auij Generic drug: Exenatide ER Inject 2 mg into the skin once a week.   carvedilol 12.5 MG tablet Commonly known as: COREG Take 12.5 mg by mouth 2 (two) times daily with a meal.   FLUoxetine 40 MG capsule Commonly known as: PROZAC Take 40 mg by mouth daily.   fluticasone 50 MCG/ACT nasal spray Commonly known as: FLONASE Place 1 spray into both nostrils daily.   hydrochlorothiazide 25 MG tablet Commonly known as: HYDRODIURIL Take 25 mg by mouth daily.   hydrOXYzine 25 MG tablet Commonly known as: ATARAX Take 1 tablet (25 mg total) by mouth every 6 (six) hours as needed for anxiety.   Jardiance 10 MG Tabs tablet Generic drug: empagliflozin Take 1 tablet (10 mg total) by mouth daily.   Lantus SoloStar 100 UNIT/ML Solostar Pen Generic drug: insulin glargine Inject 24 Units into the skin  daily.   losartan 100 MG tablet Commonly known as: COZAAR Take 100 mg by mouth daily.   montelukast 10 MG tablet Commonly known as: SINGULAIR Take 10 mg by mouth at bedtime.   potassium chloride 10 MEQ tablet Commonly known as: KLOR-CON M Take 10 mEq by mouth daily.   pregabalin 100 MG capsule Commonly known as: LYRICA Take 100 mg by mouth 2 (two) times daily.   Symbicort 160-4.5 MCG/ACT inhaler Generic drug: budesonide-formoterol Inhale 2 puffs into the lungs 2 (two) times daily.   thiamine 100 MG tablet Commonly known as: VITAMIN B1 Take 100 mg by mouth 2 (two) times daily.   torsemide 20 MG tablet Commonly known as: DEMADEX Take 2 tablets (40 mg total) by mouth 2 (two) times daily. TAKE 2 TABLETS (40mg ) BY MOUTH TWICE DAILY What changed: additional instructions   traZODone 100 MG tablet Commonly known as: DESYREL Take 1 tablet (  100 mg total) by mouth at bedtime. For insomnia   Xarelto 20 MG Tabs tablet Generic drug: rivaroxaban Take 20 mg by mouth daily with supper.        Follow-up [place], [place] Follow up.   Specialty: Internal Medicine Contact information: [place] [address] [phone]                Allergies  Allergen Reactions   Aspirin Rash    high doses ago    Consultations: Cardiology    Procedures/Studies: CARDIAC CATHETERIZATION  Result Date: 02/02/2023 Mild nonobstructive coronary artery disease with patent coronary vessels (right dominant), appropriate for medical therapy. Suspect noncardiac chest pain   ECHOCARDIOGRAM COMPLETE  Result Date: 02/02/2023    ECHOCARDIOGRAM REPORT   Patient Name:   Eilene Mccrummen Date of Exam: 02/02/2023 Medical Rec #:  [id number]       Height:       63.0 in Accession #:    [id number]      Weight:       334.9 lb Date of Birth:  1966-01-14        BSA:          2.407 m Patient Age:    40 years        BP:           138/66 mmHg Patient Gender:  F               HR:           74 bpm. Exam Location:  Inpatient Procedure: 2D Echo, Color Doppler, Cardiac Doppler and Intracardiac            Opacification Agent Indications:    R07.9* Chest pain, unspecified  History:        Patient has prior history of Echocardiogram examinations, most                 recent 06/20/2022. Risk Factors:Hypertension, Diabetes and                 Polysubstance Abuse.  Sonographer:    Raquel Sarna Senior RDCS Referring Phys: Dessa Phi  Sonographer Comments: Technically difficult study due to patient body habitus. IMPRESSIONS  1. Left ventricular ejection fraction, by estimation, is 65 to 70%. The left ventricle has normal function. The left ventricle has no regional wall motion abnormalities. Left ventricular diastolic parameters are consistent with Grade I diastolic dysfunction (impaired relaxation).  2. Right ventricular systolic function is normal. The right ventricular size is normal.  3. The mitral valve is grossly normal. No evidence of mitral valve regurgitation.  4. The aortic valve is tricuspid. Aortic valve regurgitation is not visualized. No aortic stenosis is present.  5. The inferior vena cava is normal in size with greater than 50% respiratory variability, suggesting right atrial pressure of 3 mmHg. Comparison(s): Changes from prior study are noted. 06/20/2022: LVEF 50-55%. FINDINGS  Left Ventricle: Left ventricular ejection fraction, by estimation, is 65 to 70%. The left ventricle has normal function. The left ventricle has no regional wall motion abnormalities. Definity contrast agent was given IV to delineate the left ventricular  endocardial borders. The left ventricular internal cavity size was normal in size. There is no left ventricular hypertrophy. Left ventricular diastolic parameters are consistent with Grade I diastolic dysfunction (impaired relaxation). Indeterminate filling pressures. Right Ventricle: The right ventricular size is normal. No increase in right  ventricular wall thickness. Right ventricular  systolic function is normal. Left Atrium: Left atrial size was normal in size. Right Atrium: Right atrial size was normal in size. Pericardium: There is no evidence of pericardial effusion. Mitral Valve: The mitral valve is grossly normal. No evidence of mitral valve regurgitation. Tricuspid Valve: The tricuspid valve is grossly normal. Tricuspid valve regurgitation is trivial. Aortic Valve: The aortic valve is tricuspid. Aortic valve regurgitation is not visualized. No aortic stenosis is present. Pulmonic Valve: The pulmonic valve was grossly normal. Pulmonic valve regurgitation is trivial. Aorta: The aortic root and ascending aorta are structurally normal, with no evidence of dilitation. Venous: The inferior vena cava is normal in size with greater than 50% respiratory variability, suggesting right atrial pressure of 3 mmHg. IAS/Shunts: No atrial level shunt detected by color flow Doppler.  LEFT VENTRICLE PLAX 2D LVIDd:         5.00 cm   Diastology LVIDs:         2.90 cm   LV e' medial:    5.66 cm/s LV PW:         1.00 cm   LV E/e' medial:  17.5 LV IVS:        1.10 cm   LV e' lateral:   7.07 cm/s LVOT diam:     2.10 cm   LV E/e' lateral: 14.0 LV SV:         76 LV SV Index:   31 LVOT Area:     3.46 cm  RIGHT VENTRICLE RV S prime:     12.00 cm/s TAPSE (M-mode): 3.0 cm LEFT ATRIUM             Index        RIGHT ATRIUM           Index LA diam:        4.50 cm 1.87 cm/m   RA Area:     19.90 cm LA Vol (A2C):   73.1 ml 30.37 ml/m  RA Volume:   57.70 ml  23.97 ml/m LA Vol (A4C):   53.9 ml 22.39 ml/m LA Biplane Vol: 64.3 ml 26.71 ml/m  AORTIC VALVE LVOT Vmax:   108.00 cm/s LVOT Vmean:  77.300 cm/s LVOT VTI:    0.218 m  AORTA Ao Root diam: 2.70 cm Ao Asc diam:  3.40 cm MITRAL VALVE MV Area (PHT): 4.44 cm     SHUNTS MV Decel Time: 171 msec     Systemic VTI:  0.22 m MV E velocity: 99.20 cm/s   Systemic Diam: 2.10 cm MV A velocity: 101.00 cm/s MV E/A ratio:  0.98 Lyman [name] MD Electronically signed by Lyman [name] MD Signature Date/Time: 02/02/2023/2:48:13 PM    Final    CT Angio Chest PE W and/or Wo Contrast  Result Date: 02/01/2023 CLINICAL DATA:  Midsternal chest pain. EXAM: CT ANGIOGRAPHY CHEST WITH CONTRAST TECHNIQUE: Multidetector CT imaging of the chest was performed using the standard protocol during bolus administration of intravenous contrast. Multiplanar CT image reconstructions and MIPs were obtained to evaluate the vascular anatomy. RADIATION DOSE REDUCTION: This exam was performed according to the departmental dose-optimization program which includes automated exposure control, adjustment of the mA and/or kV according to patient size and/or use of iterative reconstruction technique. CONTRAST:  63mL OMNIPAQUE IOHEXOL 350 MG/ML SOLN COMPARISON:  None Available. FINDINGS: Cardiovascular: The thoracic aorta is normal in appearance. The subsegmental pulmonary arteries are limited in evaluation secondary to areas of overlying artifact. No evidence of pulmonary embolism. Normal heart size. No pericardial effusion.  Mediastinum/Nodes: No enlarged mediastinal, hilar, or axillary lymph nodes. Thyroid gland, trachea, and esophagus demonstrate no significant findings. Lungs/Pleura: Mild atelectasis is seen within the posterior aspect of the bilateral lung bases. There is no evidence of an acute infiltrate, pleural effusion or pneumothorax. Upper Abdomen: No acute abnormality. Musculoskeletal: No chest wall abnormality. No acute or significant osseous findings. Review of the MIP images confirms the above findings. IMPRESSION: 1. No evidence of pulmonary embolism or other acute intrathoracic process. Electronically Signed   By: Virgina Norfolk M.D.   On: 02/01/2023 23:19   VAS [place] LOWER EXTREMITY VENOUS (DVT) (7a-7p)  Result Date: 02/01/2023  Lower Venous DVT Study Patient Name:  ALISYA EVERS  Date of Exam:   02/01/2023 Medical Rec #: [id number]        Accession #:     [id number] Date of Birth: Aug 31, 1966         Patient Gender: F Patient Age:   32 years Exam Location[facility] Procedure:      VAS [place] LOWER EXTREMITY VENOUS (DVT) Referring Phys: Cherlynn June --------------------------------------------------------------------------------  Indications: Swelling.  Risk Factors: None identified. Limitations: Body habitus and poor ultrasound/tissue interface. Comparison Study: No prior studies. Performing Technologist: Oliver Hum RVT  Examination Guidelines: A complete evaluation includes B-mode imaging, spectral Doppler, color Doppler, and power Doppler as needed of all accessible portions of each vessel. Bilateral testing is considered an integral part of a complete examination. Limited examinations for reoccurring indications may be performed as noted. The reflux portion of the exam is performed with the patient in reverse Trendelenburg.  +---------+---------------+---------+-----------+----------+-------------------+ RIGHT    CompressibilityPhasicitySpontaneityPropertiesThrombus Aging      +---------+---------------+---------+-----------+----------+-------------------+ CFV      Full           Yes      Yes                                      +---------+---------------+---------+-----------+----------+-------------------+ SFJ      Full                                                             +---------+---------------+---------+-----------+----------+-------------------+ FV Prox  Full                                                             +---------+---------------+---------+-----------+----------+-------------------+ FV Mid                  Yes      Yes                                      +---------+---------------+---------+-----------+----------+-------------------+ FV Distal               Yes      Yes                                       +---------+---------------+---------+-----------+----------+-------------------+  PFV      Full                                                             +---------+---------------+---------+-----------+----------+-------------------+ POP      Full           Yes      Yes                                      +---------+---------------+---------+-----------+----------+-------------------+ PTV      Full                                                             +---------+---------------+---------+-----------+----------+-------------------+ PERO                                                  Not well visualized +---------+---------------+---------+-----------+----------+-------------------+   +----+---------------+---------+-----------+----------+--------------+ LEFTCompressibilityPhasicitySpontaneityPropertiesThrombus Aging +----+---------------+---------+-----------+----------+--------------+ CFV Full           Yes      Yes                                 +----+---------------+---------+-----------+----------+--------------+    Summary: RIGHT: - There is no evidence of deep vein thrombosis in the lower extremity. However, portions of this examination were limited- see technologist comments above.  - No cystic structure found in the popliteal fossa.  LEFT: - No evidence of common femoral vein obstruction.  *See table(s) above for measurements and observations. Electronically signed by Jamelle Haring on 02/01/2023 at 3:53:05 PM.    Final    DG Chest 2 View  Result Date: 02/01/2023 CLINICAL DATA:  Nonradiating midsternal chest pain since 0400 hours, RIGHT leg swelling for 1 week EXAM: CHEST - 2 VIEW COMPARISON:  08/10/2022 FINDINGS: Borderline enlargement of cardiac silhouette. Mediastinal contours and pulmonary vascularity normal. Lungs clear. No acute infiltrate, pleural effusion, or pneumothorax. Osseous structures unremarkable. IMPRESSION: No acute abnormalities.  Electronically Signed   By: Lavonia Dana M.D.   On: 02/01/2023 14:09       Discharge Exam: Vitals:   02/03/23 0933 02/03/23 1117  BP: 128/73 125/73  Pulse: 79 75  Resp: 20 (!) 21  Temp:  98.7 F (37.1 C)  SpO2: 97% 97%    General: Pt is alert, awake, not in acute distress Cardiovascular: RRR, S1/S2 +, nonpitting edema Respiratory: CTA bilaterally, no wheezing, no rhonchi, no respiratory distress, no conversational dyspnea  Abdominal: Soft, NT, ND, bowel sounds + Extremities: nonpitting edema, no cyanosis Psych: Normal mood and affect, stable judgement and insight     The results of significant diagnostics from this hospitalization (including imaging, microbiology, ancillary and laboratory) are listed below for reference.     Microbiology: Recent Results (from the past 240 hour(s))  MRSA Next Gen by PCR, Nasal     Status: None  Collection Time: 02/02/23  2:52 AM   Specimen: Nasal Mucosa; Nasal Swab  Result Value Ref Range Status   MRSA by PCR Next Gen NOT DETECTED NOT DETECTED Final    Comment: (NOTE) The GeneXpert MRSA Assay (FDA approved for NASAL specimens only), is one component of a comprehensive MRSA colonization surveillance program. It is not intended to diagnose MRSA infection nor to guide or monitor treatment for MRSA infections. Test performance is not FDA approved in patients less than 53 years old. Performed at [facility], [place] [address] [place], [place] [address]      Labs: BNP (last 3 results) Recent Labs    06/19/22 1534 08/10/22 1153 01/02/23 1528  BNP 103.1* 165.2* [id number]   Basic Metabolic Panel: Recent Labs  Lab 02/01/23 1343 02/02/23 0451 02/03/23 0007  NA 134* 137 138  K 3.7 3.4* 3.6  CL 100 104 108  CO2 22 22 23   GLUCOSE 302* 274* 111*  BUN 13 18 23*  CREATININE 1.65* 1.94* 1.69*  CALCIUM 8.4* 8.1* 7.7*  MG  --  1.2* 1.8  PHOS  --  4.4  --    Liver Function Tests: Recent Labs  Lab 02/02/23 0451  AST 10*  ALT 13   ALKPHOS 91  BILITOT 0.4  PROT 6.3*  ALBUMIN 2.5*   No results for input(s): "LIPASE", "AMYLASE" in the last 168 hours. No results for input(s): "AMMONIA" in the last 168 hours. CBC: Recent Labs  Lab 02/01/23 1343 02/02/23 0451  WBC 7.0 7.6  HGB 10.2* 9.2*  HCT 32.6* 28.2*  MCV 86.0 84.4  PLT 314 263   Cardiac Enzymes: No results for input(s): "CKTOTAL", "CKMB", "CKMBINDEX", "TROPONINI" in the last 168 hours. BNP: Invalid input(s): "POCBNP" CBG: Recent Labs  Lab 02/02/23 1940 02/03/23 0008 02/03/23 0414 02/03/23 0803 02/03/23 1124  GLUCAP 256* 114* 144* 185* 216*   D-Dimer No results for input(s): "DDIMER" in the last 72 hours. Hgb A1c Recent Labs    02/02/23 0451  HGBA1C 7.6*   Lipid Profile Recent Labs    02/02/23 0451  CHOL 111  HDL 36*  LDLCALC 49  TRIG 128  CHOLHDL 3.1   Thyroid function studies No results for input(s): "TSH", "T4TOTAL", "T3FREE", "THYROIDAB" in the last 72 hours.  Invalid input(s): "FREET3" Anemia work up No results for input(s): "VITAMINB12", "FOLATE", "FERRITIN", "TIBC", "IRON", "RETICCTPCT" in the last 72 hours. Urinalysis    Component Value Date/Time   COLORURINE STRAW (A) 06/19/2022 2353   APPEARANCEUR CLEAR 06/19/2022 2353   LABSPEC 1.011 06/19/2022 2353   PHURINE 5.0 06/19/2022 2353   GLUCOSEU >=500 (A) 06/19/2022 2353   HGBUR SMALL (A) 06/19/2022 2353   BILIRUBINUR NEGATIVE 06/19/2022 2353   KETONESUR NEGATIVE 06/19/2022 2353   PROTEINUR NEGATIVE 06/19/2022 2353   NITRITE NEGATIVE 06/19/2022 2353   LEUKOCYTESUR NEGATIVE 06/19/2022 2353   Sepsis Labs Recent Labs  Lab 02/01/23 1343 02/02/23 0451  WBC 7.0 7.6   Microbiology Recent Results (from the past 240 hour(s))  MRSA Next Gen by PCR, Nasal     Status: None   Collection Time: 02/02/23  2:52 AM   Specimen: Nasal Mucosa; Nasal Swab  Result Value Ref Range Status   MRSA by PCR Next Gen NOT DETECTED NOT DETECTED Final    Comment: (NOTE) The GeneXpert  MRSA Assay (FDA approved for NASAL specimens only), is one component of a comprehensive MRSA colonization surveillance program. It is not intended to diagnose MRSA infection nor to guide or monitor treatment  for MRSA infections. Test performance is not FDA approved in patients less than 24 years old. Performed at [facility], [place] [address], [place], [place] [address]      Patient was seen and examined on the day of discharge and was found to be in stable condition. Time coordinating discharge: 25 minutes including assessment and coordination of care, as well as examination of the patient.   SIGNED:  Dessa Phi, DO [facility] 02/03/2023, 12:15 PM

## 2023-02-03 NOTE — Progress Notes (Addendum)
Received referral to assist with outpt rehab. Met with pt and friend. She agrees with outpt rehab. Referral sent to [facility] in [facility]. per pt's preference.   16:30 - Received message from RN, she reports that pt needs a PCP. Contacted pt and discussed the [facility]. Pt agreed with the community center. I added the information to f/u at the [facility], but pt was already discharged. Contacted member and provided the phone numbers for the [facility], [place], and the [facility]. Encouraged pt to contact the community care center on Monday and informed them that she was D/C from the hospital and she needs a f/u appt and a PCP. She verbalized understanding.

## 2023-02-05 ENCOUNTER — Encounter (HOSPITAL_COMMUNITY): Payer: Self-pay | Admitting: Cardiovascular Disease

## 2023-02-05 LAB — LIPOPROTEIN A (LPA): Lipoprotein (a): 127 nmol/L — ABNORMAL HIGH (ref <75.0)

## 2023-02-17 ENCOUNTER — Encounter (HOSPITAL_COMMUNITY): Payer: Self-pay

## 2023-02-17 ENCOUNTER — Other Ambulatory Visit: Payer: Self-pay

## 2023-02-17 ENCOUNTER — Emergency Department (HOSPITAL_COMMUNITY)
Admission: EM | Admit: 2023-02-17 | Discharge: 2023-02-17 | Disposition: A | Discharge status: Home / Self Care | Payer: Medicaid Other | Attending: Emergency Medicine | Admitting: Emergency Medicine

## 2023-02-17 DIAGNOSIS — N189 Chronic kidney disease, unspecified: Secondary | ICD-10-CM | POA: Diagnosis not present

## 2023-02-17 DIAGNOSIS — Z79899 Other long term (current) drug therapy: Secondary | ICD-10-CM | POA: Diagnosis not present

## 2023-02-17 DIAGNOSIS — Z7901 Long term (current) use of anticoagulants: Secondary | ICD-10-CM | POA: Diagnosis not present

## 2023-02-17 DIAGNOSIS — T8130XA Disruption of wound, unspecified, initial encounter: Secondary | ICD-10-CM | POA: Diagnosis not present

## 2023-02-17 DIAGNOSIS — R739 Hyperglycemia, unspecified: Secondary | ICD-10-CM

## 2023-02-17 DIAGNOSIS — E1165 Type 2 diabetes mellitus with hyperglycemia: Secondary | ICD-10-CM | POA: Insufficient documentation

## 2023-02-17 DIAGNOSIS — I129 Hypertensive chronic kidney disease with stage 1 through stage 4 chronic kidney disease, or unspecified chronic kidney disease: Secondary | ICD-10-CM | POA: Insufficient documentation

## 2023-02-17 LAB — CBC
HCT: 32.1 % — ABNORMAL LOW (ref 36.0–46.0)
Hemoglobin: 10.3 g/dL — ABNORMAL LOW (ref 12.0–15.0)
MCH: 27.8 pg (ref 26.0–34.0)
MCHC: 32.1 g/dL (ref 30.0–36.0)
MCV: 86.5 fL (ref 80.0–100.0)
Platelets: 316 10*3/uL (ref 150–400)
RBC: 3.71 MIL/uL — ABNORMAL LOW (ref 3.87–5.11)
RDW: 13.9 % (ref 11.5–15.5)
WBC: 8.9 10*3/uL (ref 4.0–10.5)
nRBC: 0 % (ref 0.0–0.2)

## 2023-02-17 LAB — BASIC METABOLIC PANEL
Anion gap: 11 (ref 5–15)
BUN: 24 mg/dL — ABNORMAL HIGH (ref 6–20)
CO2: 28 mmol/L (ref 22–32)
Calcium: 7.6 mg/dL — ABNORMAL LOW (ref 8.9–10.3)
Chloride: 92 mmol/L — ABNORMAL LOW (ref 98–111)
Creatinine, Ser: 2 mg/dL — ABNORMAL HIGH (ref 0.44–1.00)
GFR, Estimated: 29 mL/min — ABNORMAL LOW (ref >60)
Glucose, Bld: 253 mg/dL — ABNORMAL HIGH (ref 70–99)
Potassium: 3.3 mmol/L — ABNORMAL LOW (ref 3.5–5.1)
Sodium: 131 mmol/L — ABNORMAL LOW (ref 135–145)

## 2023-02-17 MED ORDER — ONDANSETRON 4 MG PO TBDP
8.0000 mg | ORAL_TABLET | Freq: Once | ORAL | Status: AC
  Administered 2023-02-17: 8 mg via ORAL
  Filled 2023-02-17: qty 2

## 2023-02-17 MED ORDER — AMOXICILLIN-POT CLAVULANATE 875-125 MG PO TABS
1.0000 | ORAL_TABLET | Freq: Once | ORAL | Status: AC
  Administered 2023-02-17: 1 via ORAL
  Filled 2023-02-17: qty 1

## 2023-02-17 MED ORDER — AMOXICILLIN-POT CLAVULANATE 875-125 MG PO TABS: 1.0000 | ORAL_TABLET | Freq: Two times a day (BID) | ORAL | 0 refills | Status: DC

## 2023-02-17 MED ORDER — OXYCODONE HCL 5 MG PO TABS
5.0000 mg | ORAL_TABLET | Freq: Once | ORAL | Status: AC
  Administered 2023-02-17: 5 mg via ORAL
  Filled 2023-02-17: qty 1

## 2023-02-17 NOTE — ED Triage Notes (Signed)
Pt has a wound on the posterior of right thigh that measures 7cmx1cm. The wound bedding is pink. Pt states she had surgery in that area 07/2022 and the wound has reopened in the same area. Pt states it's painful and she feels nauseated. Pt states she has yellow drainage from wound.

## 2023-02-17 NOTE — ED Provider Notes (Signed)
[place] [facility] Provider Note   CSN: [id number] Arrival date & time: 02/17/23  [id number]     History  Chief Complaint  Patient presents with   Wound Dehiscence    Hailey MimesViviane Ousley is a 57 y.o. female.  HPI   Patient has a history of diabetes hypertension PTSD.  Patient also has history of soft tissue infection and cyst in her posterior right thigh.  Patient states she had surgery the end of last year.  She had an open wound at that time but eventually completely healed up.  Patient states in the last few days she noted the wound opened up again and there was an odor to it.  She also has some increasing pain and soreness.  She has not had any fevers.  No vomiting or diarrhea.  Home Medications Prior to Admission medications   Medication Sig Start Date End Date Taking? Authorizing Provider  amoxicillin-clavulanate (AUGMENTIN) 875-125 MG tablet Take 1 tablet by mouth every 12 (twelve) hours. 02/17/23  Yes Linwood DibblesKnapp, Joeleen Wortley, MD  acetaminophen (TYLENOL) 325 MG tablet Take 2 tablets (650 mg total) by mouth every 6 (six) hours as needed for mild pain. 12/09/15   Armandina StammerNwoko, Agnes I, NP  amLODipine (NORVASC) 10 MG tablet Take 10 mg by mouth daily.    [provider]  ARIPiprazole (ABILIFY) 5 MG tablet Take 5 mg by mouth daily.    [provider]  aspirin EC 81 MG tablet Take 81 mg by mouth daily. Swallow whole.    [provider]  atorvastatin (LIPITOR) 80 MG tablet Take 80 mg by mouth daily. 05/13/22   [provider]  BYDUREON BCISE 2 MG/0.85ML AUIJ Inject 2 mg into the skin once a week. 05/13/22   [provider]  carvedilol (COREG) 12.5 MG tablet Take 12.5 mg by mouth 2 (two) times daily with a meal.    [provider]  empagliflozin (JARDIANCE) 10 MG TABS tablet Take 1 tablet (10 mg total) by mouth daily. Patient not taking: Reported on 02/02/2023 06/23/22   Arrien, York RamMauricio Daniel, MD  FLUoxetine (PROZAC) 40 MG capsule  Take 40 mg by mouth daily. 05/13/22   [provider]  fluticasone (FLONASE) 50 MCG/ACT nasal spray Place 1 spray into both nostrils daily. 05/13/22   [provider]  hydrochlorothiazide (HYDRODIURIL) 25 MG tablet Take 25 mg by mouth daily.    [provider]  hydrOXYzine (ATARAX/VISTARIL) 25 MG tablet Take 1 tablet (25 mg total) by mouth every 6 (six) hours as needed for anxiety. 12/09/15   Armandina StammerNwoko, Agnes I, NP  LANTUS SOLOSTAR 100 UNIT/ML Solostar Pen Inject 24 Units into the skin daily. 05/13/22   [provider]  losartan (COZAAR) 100 MG tablet Take 100 mg by mouth daily. 05/13/22   [provider]  montelukast (SINGULAIR) 10 MG tablet Take 10 mg by mouth at bedtime. 05/13/22   [provider]  potassium chloride (KLOR-CON M) 10 MEQ tablet Take 10 mEq by mouth daily. 05/13/22   [provider]  pregabalin (LYRICA) 100 MG capsule Take 100 mg by mouth 2 (two) times daily.    [provider]  SYMBICORT 160-4.5 MCG/ACT inhaler Inhale 2 puffs into the lungs 2 (two) times daily. 05/13/22   [provider]  thiamine (VITAMIN B1) 100 MG tablet Take 100 mg by mouth 2 (two) times daily. 05/13/22   [provider]  torsemide (DEMADEX) 20 MG tablet Take 2 tablets (40 mg total) by  mouth 2 (two) times daily. TAKE 2 TABLETS (40mg ) BY MOUTH TWICE DAILY Patient taking differently: Take 40 mg by mouth 2 (two) times daily. 12/19/22   Maisie Fus, MD  traZODone (DESYREL) 100 MG tablet Take 1 tablet (100 mg total) by mouth at bedtime. For insomnia 12/09/15   Armandina Stammer I, NP  XARELTO 20 MG TABS tablet Take 20 mg by mouth daily with supper. 05/13/22   [provider]      Allergies    Aspirin    Review of Systems   Review of Systems  Physical Exam Updated Vital Signs BP (!) 152/84   Pulse 85   Temp 98.6 F (37 C) (Oral)   Resp 17   Ht 1.6 m (5\' 3" )   Wt (!) 151.9 kg   LMP 07/21/2015   SpO2 98%   BMI 59.32 kg/m   Physical Exam Vitals and nursing note reviewed.  Constitutional:      Appearance: She is well-developed. She is obese. She is not diaphoretic.  HENT:     Head: Normocephalic and atraumatic.     Right Ear: External ear normal.     Left Ear: External ear normal.  Eyes:     General: No scleral icterus.       Right eye: No discharge.        Left eye: No discharge.     Conjunctiva/sclera: Conjunctivae normal.  Neck:     Trachea: No tracheal deviation.  Cardiovascular:     Rate and Rhythm: Normal rate.  Pulmonary:     Effort: Pulmonary effort is normal. No respiratory distress.     Breath sounds: No stridor.  Abdominal:     General: There is no distension.  Musculoskeletal:        General: Tenderness present. No swelling or deformity.     Cervical back: Neck supple.     Comments: Area of wound dehiscence in the posterior right thigh, mildly indurated, no fluctuance or lymphangitic streaking, base of wound has pink granulation tissue, no purulent drainage  Skin:    General: Skin is warm and dry.     Findings: No rash.  Neurological:     Mental Status: She is alert. Mental status is at baseline.     Cranial Nerves: No dysarthria or facial asymmetry.     Motor: No seizure activity.     ED Results / Procedures / Treatments   Labs (all labs ordered are listed, but only abnormal results are displayed) Labs Reviewed  CBC - Abnormal; Notable for the following components:      Result Value   RBC 3.71 (*)    Hemoglobin 10.3 (*)    HCT 32.1 (*)    All other components within normal limits  BASIC METABOLIC PANEL - Abnormal; Notable for the following components:   Sodium 131 (*)    Potassium 3.3 (*)    Chloride 92 (*)    Glucose, Bld 253 (*)    BUN 24 (*)    Creatinine, Ser 2.00 (*)    Calcium 7.6 (*)    GFR, Estimated 29 (*)    All other components within normal limits    EKG None  Radiology No results found.  Procedures Procedures    Medications Ordered in  ED Medications  amoxicillin-clavulanate (AUGMENTIN) 875-125 MG per tablet 1 tablet (has no administration in time range)  oxyCODONE (Oxy IR/ROXICODONE) immediate release tablet 5 mg (5 mg Oral Given 02/17/23 1711)  ondansetron (ZOFRAN-ODT) disintegrating tablet 8  mg (8 mg Oral Given 02/17/23 1815)    ED Course/ Medical Decision Making/ A&P Clinical Course as of 02/17/23 1917  Sat Feb 17, 2023  1857 CBC(!) CBC shows no leukocytosis.  Hemoglobin decreased at 10.3 similar to previous values [JK]  1858 Metabolic panel does show decreased sodium level as well as potassium.  Glucose elevated.  Creatinine similar to previous values [JK]    Clinical Course User Index [JK] Linwood DibblesKnapp, Azalia Neuberger, MD                             Medical Decision Making Amount and/or Complexity of Data Reviewed Labs: ordered. Decision-making details documented in ED Course.  Risk Prescription drug management.   She presented to the ED for evaluation of wound dehiscence.  Patient previously had surgery several months ago.  No signs of abscess or cellulitis.  Does appear to have healthy granulation tissue at the base.  It is possible patient may have had a seroma that opened up.  However patient has noticed an odor so we will start her on a course of antibiotics.  No signs of systemic infection.  Patient appears appropriate for discharge and outpatient management.  Recommend follow-up with her surgeons to have the wound rechecked.  Also recommend follow-up with primary care doctor to have the electrolytes and her blood sugar rechecked.        Final Clinical Impression(s) / ED Diagnoses Final diagnoses:  Wound dehiscence  Hyperglycemia  Chronic kidney disease, unspecified CKD stage    Rx / DC Orders ED Discharge Orders          Ordered    amoxicillin-clavulanate (AUGMENTIN) 875-125 MG tablet  Every 12 hours        02/17/23 1913              Linwood DibblesKnapp, Katilyn Miltenberger, MD 02/17/23 [phone]

## 2023-02-17 NOTE — Discharge Instructions (Addendum)
Take the antibiotics as prescribed.  Keep gauze over the wound and change the dressing daily.  Follow-up with your surgeons to be rechecked.  Follow-up with your primary care doctor to recheck your blood sugar and electrolytes in the next week or 2

## 2023-02-18 ENCOUNTER — Emergency Department (HOSPITAL_COMMUNITY)
Admission: EM | Admit: 2023-02-18 | Discharge: 2023-02-18 | Disposition: A | Discharge status: Home / Self Care | Payer: Medicaid Other | Attending: Emergency Medicine | Admitting: Emergency Medicine

## 2023-02-18 ENCOUNTER — Emergency Department (HOSPITAL_COMMUNITY): Payer: Medicaid Other

## 2023-02-18 DIAGNOSIS — I509 Heart failure, unspecified: Secondary | ICD-10-CM | POA: Diagnosis not present

## 2023-02-18 DIAGNOSIS — Z7982 Long term (current) use of aspirin: Secondary | ICD-10-CM | POA: Diagnosis not present

## 2023-02-18 DIAGNOSIS — Z7901 Long term (current) use of anticoagulants: Secondary | ICD-10-CM | POA: Diagnosis not present

## 2023-02-18 DIAGNOSIS — N183 Chronic kidney disease, stage 3 unspecified: Secondary | ICD-10-CM | POA: Diagnosis not present

## 2023-02-18 DIAGNOSIS — E1122 Type 2 diabetes mellitus with diabetic chronic kidney disease: Secondary | ICD-10-CM | POA: Diagnosis not present

## 2023-02-18 DIAGNOSIS — Z79899 Other long term (current) drug therapy: Secondary | ICD-10-CM | POA: Insufficient documentation

## 2023-02-18 DIAGNOSIS — R079 Chest pain, unspecified: Secondary | ICD-10-CM

## 2023-02-18 DIAGNOSIS — R072 Precordial pain: Secondary | ICD-10-CM | POA: Insufficient documentation

## 2023-02-18 LAB — TROPONIN I (HIGH SENSITIVITY)
Troponin I (High Sensitivity): 29 ng/L — ABNORMAL HIGH (ref <18)
Troponin I (High Sensitivity): 27 ng/L — ABNORMAL HIGH (ref <18)

## 2023-02-18 LAB — BASIC METABOLIC PANEL
Anion gap: 11 (ref 5–15)
BUN: 29 mg/dL — ABNORMAL HIGH (ref 6–20)
CO2: 29 mmol/L (ref 22–32)
Calcium: 7.6 mg/dL — ABNORMAL LOW (ref 8.9–10.3)
Chloride: 96 mmol/L — ABNORMAL LOW (ref 98–111)
Creatinine, Ser: 1.66 mg/dL — ABNORMAL HIGH (ref 0.44–1.00)
GFR, Estimated: 36 mL/min — ABNORMAL LOW (ref >60)
Glucose, Bld: 292 mg/dL — ABNORMAL HIGH (ref 70–99)
Potassium: 3.3 mmol/L — ABNORMAL LOW (ref 3.5–5.1)
Sodium: 136 mmol/L (ref 135–145)

## 2023-02-18 LAB — CBC
HCT: 32.7 % — ABNORMAL LOW (ref 36.0–46.0)
Hemoglobin: 10.2 g/dL — ABNORMAL LOW (ref 12.0–15.0)
MCH: 26.8 pg (ref 26.0–34.0)
MCHC: 31.2 g/dL (ref 30.0–36.0)
MCV: 85.8 fL (ref 80.0–100.0)
Platelets: 298 10*3/uL (ref 150–400)
RBC: 3.81 MIL/uL — ABNORMAL LOW (ref 3.87–5.11)
RDW: 14 % (ref 11.5–15.5)
WBC: 7.7 10*3/uL (ref 4.0–10.5)
nRBC: 0 % (ref 0.0–0.2)

## 2023-02-18 LAB — D-DIMER, QUANTITATIVE: D-Dimer, Quant: 0.32 ug/mL-FEU (ref 0.00–0.50)

## 2023-02-18 MED ORDER — ALUM & MAG HYDROXIDE-SIMETH 200-200-20 MG/5ML PO SUSP
30.0000 mL | Freq: Once | ORAL | Status: AC
  Administered 2023-02-18: 30 mL via ORAL
  Filled 2023-02-18: qty 30

## 2023-02-18 MED ORDER — LIDOCAINE VISCOUS HCL 2 % MT SOLN
15.0000 mL | Freq: Once | OROMUCOSAL | Status: AC
  Administered 2023-02-18: 15 mL via ORAL
  Filled 2023-02-18: qty 15

## 2023-02-18 MED ORDER — MORPHINE SULFATE (PF) 4 MG/ML IV SOLN: 4.0000 mg | Freq: Once | INTRAVENOUS | Status: DC

## 2023-02-18 MED ORDER — MORPHINE SULFATE (PF) 2 MG/ML IV SOLN
2.0000 mg | Freq: Once | INTRAVENOUS | Status: AC
  Administered 2023-02-18: 2 mg via INTRAVENOUS
  Filled 2023-02-18: qty 1

## 2023-02-18 MED ORDER — SODIUM CHLORIDE 0.9 % IV BOLUS
1000.0000 mL | Freq: Once | INTRAVENOUS | Status: AC
  Administered 2023-02-18: 1000 mL via INTRAVENOUS

## 2023-02-18 MED ORDER — NITROGLYCERIN 0.4 MG SL SUBL: 0.4000 mg | SUBLINGUAL_TABLET | SUBLINGUAL | Status: DC | PRN

## 2023-02-18 NOTE — ED Notes (Signed)
EKG handed to EDP 

## 2023-02-18 NOTE — Discharge Instructions (Addendum)
Please use Tylenol or ibuprofen for pain.  You may use 600 mg ibuprofen every 6 hours or 1000 mg of Tylenol every 6 hours.  You may choose to alternate between the 2.  This would be most effective.  Not to exceed 4 g of Tylenol within 24 hours.  Not to exceed 3200 mg ibuprofen 24 hours.  

## 2023-02-18 NOTE — ED Triage Notes (Signed)
Pt BIBA from hotel. C/o substernal chest pain and SOB that began this AM. Pt at [facility] yesterday for leg wound.  AOx4

## 2023-02-18 NOTE — ED Provider Notes (Signed)
Accepted handoff at shift change from [facility] PA-C. Please see prior provider note for more detail.   Briefly: Patient is 57 y.o.   DDX: concern for chest pain which is substernal in nature that began this morning.  Patient was seen at [facility] yesterday for leg wound.  She reports no pain at that time.  She does have some cardiac history, diastolic CHF, hypertension, diabetes, endorses history of alcohol use, cocaine use, gout, she scribes the chest pain as pressure.  She reports worse with standing up or upright.  She said that it is worse with deep breathing, denies any shortness of breath.  Patient initially was suspicious of acid reflux but does not think that it is acid reflux.  She is on Xarelto, has not missed any doses.   Plan: I independently interpreted lab work, BMP is notable for hyperglycemia, glucose 292, she has elevated creatinine, BUN similar to her baseline.  CBC notable for mild anemia hemoglobin 10.2.  Her troponin was elevated 29 but delta 27 on high-sensitivity troponin.  Her pain does not seem necessarily cardiac in nature, not associated with exertional, I am most suspicious for GERD, she is eating a large meal in the room, and the pain is substernal in nature.  Her D-dimer is negative at 0.32, no ongoing clinical suspicion for pulmonary embolus.  Her pain is improved but not completely resolved, but she is stable for discharge with cardiology follow-up.I independently interpreted lab work, BMP is notable for hyperglycemia, glucose 292, she has elevated creatinine, BUN similar to her baseline.  CBC notable for mild anemia hemoglobin 10.2.  Her troponin was elevated 29 but delta 27 on high-sensitivity troponin.  Her pain does not seem necessarily cardiac in nature, not associated with exertional, I am most suspicious for GERD, she is eating a large meal in the room, and the pain is substernal in nature.  Her D-dimer is negative at 0.32, no ongoing clinical suspicion for  pulmonary embolus.  Her pain is improved but not completely resolved, but she is stable for discharge with cardiology follow-up.  I independently interpreted chest x-ray which shows no evidence of acute intrathoracic abnormality.  Patient is stable for discharge at this time, extensive return precautions given.      RISR  EDTHIS    [place] 02/18/23 1836    Linwood Dibbles, MD 02/18/23 1901

## 2023-02-18 NOTE — ED Provider Notes (Signed)
[place] [facility]   CSN: [id number] Arrival date & time: 02/18/23  1330     History  Chief Complaint  Patient presents with   Chest Pain    Hailey MimesViviane Stiverson is a 57 y.o. 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  Patient stated the chest pain is substernal and does not radiate and feels like a pressure as opposed to pain.  Patient states pain is worse when standing up or being upright.  Patient denied chest pain be related to taking a deep breath or being short of breath.  Patient initially thought this was acid reflux but does not believe it is acid reflux.  Patient states she was initially nauseous but no longer.  Patient is currently on Xarelto has not missed any doses.  Patient denied abdominal pain, emesis, fever, change in sensation/motor skills, headache, vision changes, left jaw/left shoulder pain, chest pain radiating to back  Last left heart cath: 02/02/2023: Unremarkable for any stenosing or blocks Last ECHO: LVEF 65 to 70%  Home Medications Prior to Admission medications   Medication Sig Start Date End Date Taking? Authorizing Provider  acetaminophen (TYLENOL) 325 MG tablet Take 2 tablets (650 mg total) by mouth every 6 (six) hours as needed for mild pain. 12/09/15   Armandina StammerNwoko, Agnes I, NP  amLODipine (NORVASC) 10 MG tablet Take 10 mg by mouth daily.    [provider]  amoxicillin-clavulanate (AUGMENTIN) 875-125 MG tablet Take 1 tablet by mouth every 12 (twelve) hours. 02/17/23   Linwood DibblesKnapp, Jon, MD  ARIPiprazole (ABILIFY) 5 MG tablet Take 5 mg by mouth daily.    [provider]  aspirin EC 81 MG tablet Take 81 mg by mouth daily. Swallow whole.    [provider]  atorvastatin (LIPITOR) 80 MG tablet Take 80 mg  by mouth daily. 05/13/22   [provider]  BYDUREON BCISE 2 MG/0.85ML AUIJ Inject 2 mg into the skin once a week. 05/13/22   [provider]  carvedilol (COREG) 12.5 MG tablet Take 12.5 mg by mouth 2 (two) times daily with a meal.    [provider]  empagliflozin (JARDIANCE) 10 MG TABS tablet Take 1 tablet (10 mg total) by mouth daily. Patient not taking: Reported on 02/02/2023 06/23/22   Arrien, York RamMauricio Daniel, MD  FLUoxetine (PROZAC) 40 MG capsule Take 40 mg by mouth daily. 05/13/22   [provider]  fluticasone (FLONASE) 50 MCG/ACT nasal spray Place 1 spray into both nostrils daily. 05/13/22   [provider]  hydrochlorothiazide (HYDRODIURIL) 25 MG tablet Take 25 mg by mouth daily.    [provider]  hydrOXYzine (ATARAX/VISTARIL) 25 MG tablet Take 1 tablet (25 mg total) by mouth every 6 (six) hours as needed for anxiety. 12/09/15   Armandina StammerNwoko, Agnes I, NP  LANTUS SOLOSTAR 100 UNIT/ML Solostar Pen Inject 24 Units into the skin daily. 05/13/22   [provider]  losartan (COZAAR) 100 MG tablet Take 100 mg by mouth daily. 05/13/22   [provider]  montelukast (SINGULAIR) 10 MG tablet Take 10 mg by mouth at bedtime. 05/13/22   [provider]  potassium chloride (KLOR-CON M) 10 MEQ tablet Take 10 mEq by mouth daily. 05/13/22   [provider]  pregabalin (LYRICA) 100 MG capsule  Take 100 mg by mouth 2 (two) times daily.    [provider]  SYMBICORT 160-4.5 MCG/ACT inhaler Inhale 2 puffs into the lungs 2 (two) times daily. 05/13/22   [provider]  thiamine (VITAMIN B1) 100 MG tablet Take 100 mg by mouth 2 (two) times daily. 05/13/22   [provider]  torsemide (DEMADEX) 20 MG tablet Take 2 tablets (40 mg total) by mouth 2 (two) times daily. TAKE 2 TABLETS (40mg ) BY MOUTH TWICE DAILY Patient taking differently: Take 40 mg by mouth 2 (two) times daily. 12/19/22   Maisie Fus, MD  traZODone (DESYREL)  100 MG tablet Take 1 tablet (100 mg total) by mouth at bedtime. For insomnia 12/09/15   Armandina Stammer I, NP  XARELTO 20 MG TABS tablet Take 20 mg by mouth daily with supper. 05/13/22   [provider]      Allergies    Aspirin    Review of Systems   Review of Systems  Cardiovascular:  Positive for chest pain.  See HPI  Physical Exam Updated Vital Signs BP 128/80   Pulse 90   Temp 97.8 F (36.6 C) (Oral)   Resp 20   LMP 07/21/2015   SpO2 99%  Physical Exam Vitals reviewed.  Constitutional:      General: She is not in acute distress.    Appearance: She is obese.  HENT:     Head: Normocephalic and atraumatic.  Eyes:     Extraocular Movements: Extraocular movements intact.     Conjunctiva/sclera: Conjunctivae normal.     Pupils: Pupils are equal, round, and reactive to light.  Cardiovascular:     Rate and Rhythm: Normal rate and regular rhythm.     Pulses: Normal pulses.     Heart sounds: Normal heart sounds.     Comments: 2+ bilateral radial/dorsalis pedis pulses with regular rate Pulmonary:     Effort: Pulmonary effort is normal. No respiratory distress.     Breath sounds: Normal breath sounds.  Abdominal:     Palpations: Abdomen is soft.     Tenderness: There is no abdominal tenderness. There is no guarding or rebound.  Musculoskeletal:        General: Normal range of motion.     Cervical back: Normal range of motion and neck supple.     Comments: 5 out of 5 bilateral grip/leg extension strength pain nonreproducible with palpation  Skin:    General: Skin is warm and dry.     Capillary Refill: Capillary refill takes less than 2 seconds.  Neurological:     General: No focal deficit present.     Mental Status: She is alert and oriented to person, place, and time.     Comments: Sensation intact in all 4 limbs  Psychiatric:        Mood and Affect: Mood normal.     ED Results / Procedures / Treatments   Labs (all labs ordered are listed, but only abnormal  results are displayed) Labs Reviewed  CBC  BASIC METABOLIC PANEL  TROPONIN I (HIGH SENSITIVITY)    EKG None  Radiology No results found.  Procedures Procedures    Medications Ordered in ED Medications  sodium chloride 0.9 % bolus 1,000 mL (has no administration in time range)  alum & mag hydroxide-simeth (MAALOX/MYLANTA) 200-200-20 MG/5ML suspension 30 mL (30 mLs Oral Given 02/18/23 1454)    And  lidocaine (XYLOCAINE) 2 % viscous mouth solution 15 mL (15 mLs Oral Given 02/18/23 1454)  ED Course/ Medical Decision Making/ A&P                             Medical Decision Making  Hailey Klein 57 y.o. presented today for chest pain. Working DDx that I considered at this time includes, but not limited to, ACS, GERD, pulmonary embolism, community-acquired pneumonia, aortic dissection, pneumothorax, underlying bony abnormality, anemia, thyrotoxicosis, esophageal rupture.    R/o Dx: Cannot determine at this time  Review of prior external notes: 02/01/2023 ED to hospital admission  Unique Tests and My Interpretation:  EKG: Sinus rhythm 93 bpm, right bundle branch block and left anterior fascicular block that is been noted on previous EKGs, no ST elevation/depressions Troponin: pending CXR: pending CBC: pending BMP: pending  Discussion with Independent Historian: None  Discussion of Management of Tests: None  Risk: Cannot be determined at this time  Risk Stratification Score: HEART: 4 (EKG, age, risk factors)  Plan: Patient presented for chest pain. On exam patient was in acute distress and stable vitals. Patient's physical was unremarkable. Labs and CXR will be ordered.  Patient will be given a GI cocktail as this could possibly be acid reflux.  Patient stable at this time and in no acute distress.  Upon chart review patient had been admitted back in 02/03/2023 for chest pain  Patient was signed out to [employer], PA-C at 1500.  The plan at this time is  anticipated discharge pending labs.  Patient has been given a GI cocktail at this time and is in no acute distress.         Final Clinical Impression(s) / ED Diagnoses Final diagnoses:  None    Rx / DC Orders ED Discharge Orders     None         Remi Deter 02/18/23 1503    Bethann Berkshire, MD 02/20/23 [phone]

## 2023-02-21 ENCOUNTER — Encounter (HOSPITAL_COMMUNITY): Payer: Self-pay | Admitting: Emergency Medicine

## 2023-02-21 ENCOUNTER — Emergency Department (HOSPITAL_COMMUNITY)
Admission: EM | Admit: 2023-02-21 | Discharge: 2023-02-22 | Disposition: A | Discharge status: Home / Self Care | Payer: Medicaid Other | Attending: Emergency Medicine | Admitting: Emergency Medicine

## 2023-02-21 ENCOUNTER — Emergency Department (HOSPITAL_COMMUNITY): Payer: Medicaid Other

## 2023-02-21 DIAGNOSIS — Z7901 Long term (current) use of anticoagulants: Secondary | ICD-10-CM | POA: Insufficient documentation

## 2023-02-21 DIAGNOSIS — Z7982 Long term (current) use of aspirin: Secondary | ICD-10-CM | POA: Diagnosis not present

## 2023-02-21 DIAGNOSIS — R072 Precordial pain: Secondary | ICD-10-CM | POA: Diagnosis present

## 2023-02-21 DIAGNOSIS — R519 Headache, unspecified: Secondary | ICD-10-CM | POA: Insufficient documentation

## 2023-02-21 DIAGNOSIS — Z79899 Other long term (current) drug therapy: Secondary | ICD-10-CM | POA: Insufficient documentation

## 2023-02-21 NOTE — ED Triage Notes (Signed)
Pt reports chest pain, headache, back pain starting today. Denies SOB. Has cards appt this month she thinks.

## 2023-02-22 LAB — CBC WITH DIFFERENTIAL/PLATELET
Abs Immature Granulocytes: 0.02 10*3/uL (ref 0.00–0.07)
Basophils Absolute: 0.1 10*3/uL (ref 0.0–0.1)
Basophils Relative: 1 %
Eosinophils Absolute: 0.1 10*3/uL (ref 0.0–0.5)
Eosinophils Relative: 1 %
HCT: 34.8 % — ABNORMAL LOW (ref 36.0–46.0)
Hemoglobin: 10.9 g/dL — ABNORMAL LOW (ref 12.0–15.0)
Immature Granulocytes: 0 %
Lymphocytes Relative: 28 %
Lymphs Abs: 2.3 10*3/uL (ref 0.7–4.0)
MCH: 27.1 pg (ref 26.0–34.0)
MCHC: 31.3 g/dL (ref 30.0–36.0)
MCV: 86.6 fL (ref 80.0–100.0)
Monocytes Absolute: 0.7 10*3/uL (ref 0.1–1.0)
Monocytes Relative: 8 %
Neutro Abs: 5.2 10*3/uL (ref 1.7–7.7)
Neutrophils Relative %: 62 %
Platelets: 370 10*3/uL (ref 150–400)
RBC: 4.02 MIL/uL (ref 3.87–5.11)
RDW: 14 % (ref 11.5–15.5)
WBC: 8.5 10*3/uL (ref 4.0–10.5)
nRBC: 0 % (ref 0.0–0.2)

## 2023-02-22 LAB — COMPREHENSIVE METABOLIC PANEL
ALT: 14 U/L (ref 0–44)
AST: 10 U/L — ABNORMAL LOW (ref 15–41)
Albumin: 2.9 g/dL — ABNORMAL LOW (ref 3.5–5.0)
Alkaline Phosphatase: 102 U/L (ref 38–126)
Anion gap: 15 (ref 5–15)
BUN: 8 mg/dL (ref 6–20)
CO2: 26 mmol/L (ref 22–32)
Calcium: 8.4 mg/dL — ABNORMAL LOW (ref 8.9–10.3)
Chloride: 91 mmol/L — ABNORMAL LOW (ref 98–111)
Creatinine, Ser: 1.17 mg/dL — ABNORMAL HIGH (ref 0.44–1.00)
GFR, Estimated: 55 mL/min — ABNORMAL LOW (ref >60)
Glucose, Bld: 248 mg/dL — ABNORMAL HIGH (ref 70–99)
Potassium: 3.1 mmol/L — ABNORMAL LOW (ref 3.5–5.1)
Sodium: 132 mmol/L — ABNORMAL LOW (ref 135–145)
Total Bilirubin: 0.8 mg/dL (ref 0.3–1.2)
Total Protein: 7.4 g/dL (ref 6.5–8.1)

## 2023-02-22 LAB — TROPONIN I (HIGH SENSITIVITY)
Troponin I (High Sensitivity): 16 ng/L (ref <18)
Troponin I (High Sensitivity): 16 ng/L (ref <18)

## 2023-02-22 MED ORDER — DEXAMETHASONE SODIUM PHOSPHATE 4 MG/ML IJ SOLN
4.0000 mg | Freq: Once | INTRAMUSCULAR | Status: AC
  Administered 2023-02-22: 4 mg via INTRAMUSCULAR
  Filled 2023-02-22: qty 1

## 2023-02-22 MED ORDER — ACETAMINOPHEN 500 MG PO TABS
1000.0000 mg | ORAL_TABLET | Freq: Once | ORAL | Status: AC
  Administered 2023-02-22: 1000 mg via ORAL
  Filled 2023-02-22: qty 2

## 2023-02-22 MED ORDER — POTASSIUM CHLORIDE CRYS ER 20 MEQ PO TBCR
40.0000 meq | EXTENDED_RELEASE_TABLET | Freq: Once | ORAL | Status: AC
  Administered 2023-02-22: 40 meq via ORAL
  Filled 2023-02-22: qty 2

## 2023-02-22 NOTE — ED Provider Notes (Signed)
Hailey Klein EMERGENCY DEPARTMENT AT [facility] Provider Note   CSN: [id number] Arrival date & time: 02/21/23  2312     History  Chief Complaint  Patient presents with   Chest Pain    Hailey Klein is a 57 y.o. female.  The history is provided by the patient.  Chest Pain Pain location:  Substernal area Pain quality: dull   Pain radiates to:  Does not radiate Pain severity:  Moderate Onset quality:  Gradual Duration:  1 day Timing:  Constant Progression:  Unchanged Chronicity:  Recurrent Context: at rest   Relieved by:  Nothing Worsened by:  Nothing Ineffective treatments:  None tried Associated symptoms: no altered mental status, no claudication, no cough, no dizziness, no dysphagia, no fatigue, no fever, no nausea, no numbness, no orthopnea, no palpitations, no shortness of breath, no vomiting and no weakness   Risk factors: not female   Patient with recent negative cardiac catheterization presents with SSCP and also a frontal HA.  No cough.  No SOB.  No f/c/r.  No n/v/d.       Home Medications Prior to Admission medications   Medication Sig Start Date End Date Taking? Authorizing Provider  acetaminophen (TYLENOL) 325 MG tablet Take 2 tablets (650 mg total) by mouth every 6 (six) hours as needed for mild pain. 12/09/15   Armandina Stammer I, NP  amLODipine (NORVASC) 10 MG tablet Take 10 mg by mouth daily.    [provider]  amoxicillin-clavulanate (AUGMENTIN) 875-125 MG tablet Take 1 tablet by mouth every 12 (twelve) hours. 02/17/23   Linwood Dibbles, MD  ARIPiprazole (ABILIFY) 5 MG tablet Take 5 mg by mouth daily.    [provider]  aspirin EC 81 MG tablet Take 81 mg by mouth daily. Swallow whole.    [provider]  atorvastatin (LIPITOR) 80 MG tablet Take 80 mg by mouth daily. 05/13/22   [provider]  BYDUREON BCISE 2 MG/0.85ML AUIJ Inject 2 mg into the skin once a week. 05/13/22   [provider]  carvedilol (COREG) 12.5  MG tablet Take 12.5 mg by mouth 2 (two) times daily with a meal.    [provider]  empagliflozin (JARDIANCE) 10 MG TABS tablet Take 1 tablet (10 mg total) by mouth daily. Patient not taking: Reported on 02/02/2023 06/23/22   Arrien, York Ram, MD  FLUoxetine (PROZAC) 40 MG capsule Take 40 mg by mouth daily. 05/13/22   [provider]  fluticasone (FLONASE) 50 MCG/ACT nasal spray Place 1 spray into both nostrils daily. 05/13/22   [provider]  hydrochlorothiazide (HYDRODIURIL) 25 MG tablet Take 25 mg by mouth daily.    [provider]  hydrOXYzine (ATARAX/VISTARIL) 25 MG tablet Take 1 tablet (25 mg total) by mouth every 6 (six) hours as needed for anxiety. 12/09/15   Armandina Stammer I, NP  LANTUS SOLOSTAR 100 UNIT/ML Solostar Pen Inject 24 Units into the skin daily. 05/13/22   [provider]  losartan (COZAAR) 100 MG tablet Take 100 mg by mouth daily. 05/13/22   [provider]  montelukast (SINGULAIR) 10 MG tablet Take 10 mg by mouth at bedtime. 05/13/22   [provider]  potassium chloride (KLOR-CON M) 10 MEQ tablet Take 10 mEq by mouth daily. 05/13/22   [provider]  pregabalin (LYRICA) 100 MG capsule Take 100 mg by mouth 2 (two) times daily.    [provider]  SYMBICORT 160-4.5 MCG/ACT inhaler Inhale 2 puffs into the lungs  2 (two) times daily. 05/13/22   [provider]  thiamine (VITAMIN B1) 100 MG tablet Take 100 mg by mouth 2 (two) times daily. 05/13/22   [provider]  torsemide (DEMADEX) 20 MG tablet Take 2 tablets (40 mg total) by mouth 2 (two) times daily. TAKE 2 TABLETS (40mg ) BY MOUTH TWICE DAILY Patient taking differently: Take 40 mg by mouth 2 (two) times daily. 12/19/22   Maisie Fus, MD  traZODone (DESYREL) 100 MG tablet Take 1 tablet (100 mg total) by mouth at bedtime. For insomnia 12/09/15   Armandina Stammer I, NP  XARELTO 20 MG TABS tablet Take 20 mg by mouth daily with supper. 05/13/22    [provider]      Allergies    Aspirin    Review of Systems   Review of Systems  Constitutional:  Negative for fatigue and fever.  HENT:  Negative for trouble swallowing.   Eyes:  Negative for visual disturbance.  Respiratory:  Negative for cough and shortness of breath.   Cardiovascular:  Positive for chest pain. Negative for palpitations, orthopnea and claudication.  Gastrointestinal:  Negative for nausea and vomiting.  Neurological:  Negative for dizziness, facial asymmetry, speech difficulty, weakness and numbness.  All other systems reviewed and are negative.   Physical Exam Updated Vital Signs BP (!) 163/101   Pulse 83   Temp 97.7 F (36.5 C)   Resp (!) 22   LMP 07/21/2015   SpO2 96%  Physical Exam Vitals and nursing note reviewed.  Constitutional:      General: She is not in acute distress.    Appearance: Normal appearance. She is well-developed.  HENT:     Head: Normocephalic and atraumatic.     Nose: Nose normal.  Eyes:     Pupils: Pupils are equal, round, and reactive to light.  Cardiovascular:     Rate and Rhythm: Normal rate and regular rhythm.     Pulses: Normal pulses.     Heart sounds: Normal heart sounds.  Pulmonary:     Effort: Pulmonary effort is normal. No respiratory distress.     Breath sounds: Normal breath sounds.  Abdominal:     General: Bowel sounds are normal. There is no distension.     Palpations: Abdomen is soft.     Tenderness: There is no abdominal tenderness. There is no guarding or rebound.  Genitourinary:    Vagina: No vaginal discharge.  Musculoskeletal:        General: Normal range of motion.     Cervical back: Normal, normal range of motion and neck supple. No rigidity or crepitus.     Thoracic back: Normal.     Lumbar back: Normal.  Skin:    General: Skin is warm and dry.     Capillary Refill: Capillary refill takes less than 2 seconds.     Findings: No erythema or rash.  Neurological:     General: No focal  deficit present.     Mental Status: She is alert.     Cranial Nerves: No cranial nerve deficit.     Deep Tendon Reflexes: Reflexes normal.  Psychiatric:        Mood and Affect: Mood normal.        Behavior: Behavior normal.     ED Results / Procedures / Treatments   Labs (all labs ordered are listed, but only abnormal results are displayed) Results for orders placed or performed during the hospital encounter of 02/21/23  Comprehensive metabolic  panel  Result Value Ref Range   Sodium 132 (L) 135 - 145 mmol/L   Potassium 3.1 (L) 3.5 - 5.1 mmol/L   Chloride 91 (L) 98 - 111 mmol/L   CO2 26 22 - 32 mmol/L   Glucose, Bld 248 (H) 70 - 99 mg/dL   BUN 8 6 - 20 mg/dL   Creatinine, Ser 2.03 (H) 0.44 - 1.00 mg/dL   Calcium 8.4 (L) 8.9 - 10.3 mg/dL   Total Protein 7.4 6.5 - 8.1 g/dL   Albumin 2.9 (L) 3.5 - 5.0 g/dL   AST 10 (L) 15 - 41 U/L   ALT 14 0 - 44 U/L   Alkaline Phosphatase 102 38 - 126 U/L   Total Bilirubin 0.8 0.3 - 1.2 mg/dL   GFR, Estimated 55 (L) >60 mL/min   Anion gap 15 5 - 15  CBC with Differential  Result Value Ref Range   WBC 8.5 4.0 - 10.5 K/uL   RBC 4.02 3.87 - 5.11 MIL/uL   Hemoglobin 10.9 (L) 12.0 - 15.0 g/dL   HCT 55.9 (L) 74.1 - 63.8 %   MCV 86.6 80.0 - 100.0 fL   MCH 27.1 26.0 - 34.0 pg   MCHC 31.3 30.0 - 36.0 g/dL   RDW 45.3 64.6 - 80.3 %   Platelets 370 150 - 400 K/uL   nRBC 0.0 0.0 - 0.2 %   Neutrophils Relative % 62 %   Neutro Abs 5.2 1.7 - 7.7 K/uL   Lymphocytes Relative 28 %   Lymphs Abs 2.3 0.7 - 4.0 K/uL   Monocytes Relative 8 %   Monocytes Absolute 0.7 0.1 - 1.0 K/uL   Eosinophils Relative 1 %   Eosinophils Absolute 0.1 0.0 - 0.5 K/uL   Basophils Relative 1 %   Basophils Absolute 0.1 0.0 - 0.1 K/uL   Immature Granulocytes 0 %   Abs Immature Granulocytes 0.02 0.00 - 0.07 K/uL  Troponin I (High Sensitivity)  Result Value Ref Range   Troponin I (High Sensitivity) 16 <18 ng/L  Troponin I (High Sensitivity)  Result Value Ref Range    Troponin I (High Sensitivity) 16 <18 ng/L   DG Chest 2 View  Result Date: 02/22/2023 CLINICAL DATA:  Chest pain and back pain EXAM: CHEST - 2 VIEW COMPARISON:  Chest x-ray 02/18/2023 FINDINGS: The heart is mildly enlarged, unchanged. The lungs are clear. There is no pleural effusion or pneumothorax. The osseous structures are within normal limits. IMPRESSION: 1. No active cardiopulmonary disease. 2. Mild cardiomegaly. Electronically Signed   By: Darliss Cheney M.D.   On: 02/22/2023 00:02   DG Chest Port 1 View  Result Date: 02/18/2023 CLINICAL DATA:  Chest pain EXAM: PORTABLE CHEST 1 VIEW COMPARISON:  February 01, 2023 FINDINGS: The cardiomediastinal silhouette is unchanged in contour.Incomplete visualization of the RIGHT costophrenic angle. No pleural effusion. No pneumothorax. No acute pleuroparenchymal abnormality. IMPRESSION: No acute cardiopulmonary abnormality. Electronically Signed   By: Meda Klinefelter M.D.   On: 02/18/2023 15:03   DG Knee Complete 4 Views Right  Result Date: 02/03/2023 CLINICAL DATA:  RIGHT knee pain since before hospital admission EXAM: RIGHT KNEE - COMPLETE 4+ VIEW COMPARISON:  05/24/2022 FINDINGS: Osseous mineralization low normal. Medial compartment joint space narrowing and spur formation. No acute fracture, dislocation, or bone destruction. No acute osseous findings. IMPRESSION: Degenerative changes RIGHT knee. No acute abnormalities. Electronically Signed   By: Ulyses Southward M.D.   On: 02/03/2023 15:47   CARDIAC CATHETERIZATION  Result Date: 02/02/2023 Mild  nonobstructive coronary artery disease with patent coronary vessels (right dominant), appropriate for medical therapy. Suspect noncardiac chest pain   ECHOCARDIOGRAM COMPLETE  Result Date: 02/02/2023    ECHOCARDIOGRAM REPORT   Patient Name:   Hailey Klein Date of Exam: 02/02/2023 Medical Rec #:  [id number]       Height:       63.0 in Accession #:    [id number]      Weight:       334.9 lb Date of Birth:  June 22, 1966         BSA:          2.407 m Patient Age:    57 years        BP:           138/66 mmHg Patient Gender: F               HR:           74 bpm. Exam Location:  Inpatient Procedure: 2D Echo, Color Doppler, Cardiac Doppler and Intracardiac            Opacification Agent Indications:    R07.9* Chest pain, unspecified  History:        Patient has prior history of Echocardiogram examinations, most                 recent 06/20/2022. Risk Factors:Hypertension, Diabetes and                 Polysubstance Abuse.  Sonographer:    Irving Burton Senior RDCS Referring Phys: Noralee Stain  Sonographer Comments: Technically difficult study due to patient body habitus. IMPRESSIONS  1. Left ventricular ejection fraction, by estimation, is 65 to 70%. The left ventricle has normal function. The left ventricle has no regional wall motion abnormalities. Left ventricular diastolic parameters are consistent with Grade I diastolic dysfunction (impaired relaxation).  2. Right ventricular systolic function is normal. The right ventricular size is normal.  3. The mitral valve is grossly normal. No evidence of mitral valve regurgitation.  4. The aortic valve is tricuspid. Aortic valve regurgitation is not visualized. No aortic stenosis is present.  5. The inferior vena cava is normal in size with greater than 50% respiratory variability, suggesting right atrial pressure of 3 mmHg. Comparison(s): Changes from prior study are noted. 06/20/2022: LVEF 50-55%. FINDINGS  Left Ventricle: Left ventricular ejection fraction, by estimation, is 65 to 70%. The left ventricle has normal function. The left ventricle has no regional wall motion abnormalities. Definity contrast agent was given IV to delineate the left ventricular  endocardial borders. The left ventricular internal cavity size was normal in size. There is no left ventricular hypertrophy. Left ventricular diastolic parameters are consistent with Grade I diastolic dysfunction (impaired relaxation). Indeterminate  filling pressures. Right Ventricle: The right ventricular size is normal. No increase in right ventricular wall thickness. Right ventricular systolic function is normal. Left Atrium: Left atrial size was normal in size. Right Atrium: Right atrial size was normal in size. Pericardium: There is no evidence of pericardial effusion. Mitral Valve: The mitral valve is grossly normal. No evidence of mitral valve regurgitation. Tricuspid Valve: The tricuspid valve is grossly normal. Tricuspid valve regurgitation is trivial. Aortic Valve: The aortic valve is tricuspid. Aortic valve regurgitation is not visualized. No aortic stenosis is present. Pulmonic Valve: The pulmonic valve was grossly normal. Pulmonic valve regurgitation is trivial. Aorta: The aortic root and ascending aorta are structurally normal, with no evidence of dilitation. Venous: The inferior vena cava  is normal in size with greater than 50% respiratory variability, suggesting right atrial pressure of 3 mmHg. IAS/Shunts: No atrial level shunt detected by color flow Doppler.  LEFT VENTRICLE PLAX 2D LVIDd:         5.00 cm   Diastology LVIDs:         2.90 cm   LV e' medial:    5.66 cm/s LV PW:         1.00 cm   LV E/e' medial:  17.5 LV IVS:        1.10 cm   LV e' lateral:   7.07 cm/s LVOT diam:     2.10 cm   LV E/e' lateral: 14.0 LV SV:         76 LV SV Index:   31 LVOT Area:     3.46 cm  RIGHT VENTRICLE RV S prime:     12.00 cm/s TAPSE (M-mode): 3.0 cm LEFT ATRIUM             Index        RIGHT ATRIUM           Index LA diam:        4.50 cm 1.87 cm/m   RA Area:     19.90 cm LA Vol (A2C):   73.1 ml 30.37 ml/m  RA Volume:   57.70 ml  23.97 ml/m LA Vol (A4C):   53.9 ml 22.39 ml/m LA Biplane Vol: 64.3 ml 26.71 ml/m  AORTIC VALVE LVOT Vmax:   108.00 cm/s LVOT Vmean:  77.300 cm/s LVOT VTI:    0.218 m  AORTA Ao Root diam: 2.70 cm Ao Asc diam:  3.40 cm MITRAL VALVE MV Area (PHT): 4.44 cm     SHUNTS MV Decel Time: 171 msec     Systemic VTI:  0.22 m MV E velocity:  99.20 cm/s   Systemic Diam: 2.10 cm MV A velocity: 101.00 cm/s MV E/A ratio:  0.98 Zoila Shutter MD Electronically signed by Zoila Shutter MD Signature Date/Time: 02/02/2023/2:48:13 PM    Final    CT Angio Chest PE W and/or Wo Contrast  Result Date: 02/01/2023 CLINICAL DATA:  Midsternal chest pain. EXAM: CT ANGIOGRAPHY CHEST WITH CONTRAST TECHNIQUE: Multidetector CT imaging of the chest was performed using the standard protocol during bolus administration of intravenous contrast. Multiplanar CT image reconstructions and MIPs were obtained to evaluate the vascular anatomy. RADIATION DOSE REDUCTION: This exam was performed according to the departmental dose-optimization program which includes automated exposure control, adjustment of the mA and/or kV according to patient size and/or use of iterative reconstruction technique. CONTRAST:  68mL OMNIPAQUE IOHEXOL 350 MG/ML SOLN COMPARISON:  None Available. FINDINGS: Cardiovascular: The thoracic aorta is normal in appearance. The subsegmental pulmonary arteries are limited in evaluation secondary to areas of overlying artifact. No evidence of pulmonary embolism. Normal heart size. No pericardial effusion. Mediastinum/Nodes: No enlarged mediastinal, hilar, or axillary lymph nodes. Thyroid gland, trachea, and esophagus demonstrate no significant findings. Lungs/Pleura: Mild atelectasis is seen within the posterior aspect of the bilateral lung bases. There is no evidence of an acute infiltrate, pleural effusion or pneumothorax. Upper Abdomen: No acute abnormality. Musculoskeletal: No chest wall abnormality. No acute or significant osseous findings. Review of the MIP images confirms the above findings. IMPRESSION: 1. No evidence of pulmonary embolism or other acute intrathoracic process. Electronically Signed   By: Aram Candela M.D.   On: 02/01/2023 23:19   VAS [place] LOWER EXTREMITY VENOUS (DVT) (7a-7p)  Result Date: 02/01/2023  Lower Venous DVT Study Patient Name:   Hailey Klein  Date of Exam:   02/01/2023 Medical Rec #: [id number]        Accession #:    [id number] Date of Birth: [birth year]         Patient Gender: F Patient Age:   43 years Exam Location:  [facility] Procedure:      VAS [place] LOWER EXTREMITY VENOUS (DVT) Referring Phys: Barrie Dunker --------------------------------------------------------------------------------  Indications: Swelling.  Risk Factors: None identified. Limitations: Body habitus and poor ultrasound/tissue interface. Comparison Study: No prior studies. Performing Technologist: Chanda Busing RVT  Examination Guidelines: A complete evaluation includes B-mode imaging, spectral Doppler, color Doppler, and power Doppler as needed of all accessible portions of each vessel. Bilateral testing is considered an integral part of a complete examination. Limited examinations for reoccurring indications may be performed as noted. The reflux portion of the exam is performed with the patient in reverse Trendelenburg.  +---------+---------------+---------+-----------+----------+-------------------+ RIGHT    CompressibilityPhasicitySpontaneityPropertiesThrombus Aging      +---------+---------------+---------+-----------+----------+-------------------+ CFV      Full           Yes      Yes                                      +---------+---------------+---------+-----------+----------+-------------------+ SFJ      Full                                                             +---------+---------------+---------+-----------+----------+-------------------+ FV Prox  Full                                                             +---------+---------------+---------+-----------+----------+-------------------+ FV Mid                  Yes      Yes                                      +---------+---------------+---------+-----------+----------+-------------------+ FV Distal               Yes      Yes                                       +---------+---------------+---------+-----------+----------+-------------------+ PFV      Full                                                             +---------+---------------+---------+-----------+----------+-------------------+ POP      Full           Yes      Yes                                      +---------+---------------+---------+-----------+----------+-------------------+  PTV      Full                                                             +---------+---------------+---------+-----------+----------+-------------------+ PERO                                                  Not well visualized +---------+---------------+---------+-----------+----------+-------------------+   +----+---------------+---------+-----------+----------+--------------+ LEFTCompressibilityPhasicitySpontaneityPropertiesThrombus Aging +----+---------------+---------+-----------+----------+--------------+ CFV Full           Yes      Yes                                 +----+---------------+---------+-----------+----------+--------------+    Summary: RIGHT: - There is no evidence of deep vein thrombosis in the lower extremity. However, portions of this examination were limited- see technologist comments above.  - No cystic structure found in the popliteal fossa.  LEFT: - No evidence of common femoral vein obstruction.  *See table(s) above for measurements and observations. Electronically signed by Heath Lark on 02/01/2023 at 3:53:05 PM.    Final    DG Chest 2 View  Result Date: 02/01/2023 CLINICAL DATA:  Nonradiating midsternal chest pain since 0400 hours, RIGHT leg swelling for 1 week EXAM: CHEST - 2 VIEW COMPARISON:  08/10/2022 FINDINGS: Borderline enlargement of cardiac silhouette. Mediastinal contours and pulmonary vascularity normal. Lungs clear. No acute infiltrate, pleural effusion, or pneumothorax. Osseous structures unremarkable. IMPRESSION: No acute  abnormalities. Electronically Signed   By: Ulyses Southward M.D.   On: 02/01/2023 14:09    EKG NSR 91 RBBB  Radiology DG Chest 2 View  Result Date: 02/22/2023 CLINICAL DATA:  Chest pain and back pain EXAM: CHEST - 2 VIEW COMPARISON:  Chest x-ray 02/18/2023 FINDINGS: The heart is mildly enlarged, unchanged. The lungs are clear. There is no pleural effusion or pneumothorax. The osseous structures are within normal limits. IMPRESSION: 1. No active cardiopulmonary disease. 2. Mild cardiomegaly. Electronically Signed   By: Darliss Cheney M.D.   On: 02/22/2023 00:02    Procedures Procedures    Medications Ordered in ED Medications  acetaminophen (TYLENOL) tablet 1,000 mg (1,000 mg Oral Given 02/22/23 0532)  dexamethasone (DECADRON) injection 4 mg (4 mg Intramuscular Given 02/22/23 0533)  potassium chloride SA (KLOR-CON M) CR tablet 40 mEq (40 mEq Oral Given 02/22/23 0531)    ED Course/ Medical Decision Making/ A&P                             Medical Decision Making Chest pain and frontal HA  Amount and/or Complexity of Data Reviewed External Data Reviewed: notes.    Details: Previous notes and EKGs reviewed  Labs: ordered.    Details: 2 negative troponins 16/16.  Normal white count 8.5 low hemoglobin 10.9, normal platelet count.  Sodium low 132, low potassium 3.1 (given in ED), creatinine 1.17 slightly high.   Radiology: ordered and independent interpretation performed.    Details: Negative cxr by me   Risk OTC drugs. Prescription drug management. Risk Details: Patient is very well appearing with normal examn  Patient was ruled out for Mi with recent negative stress test.  I do not believe her symptoms are consistent with PE.  Treatment for HA in the ED.  Feeling improved.  Stable for discharge.  Strict return     Final Clinical Impression(s) / ED Diagnoses Final diagnoses:  Precordial pain   Return for intractable cough, coughing up blood, fevers > 100.4 unrelieved by medication,  shortness of breath, intractable vomiting, chest pain, shortness of breath, weakness, numbness, changes in speech, facial asymmetry, abdominal pain, passing out, Inability to tolerate liquids or food, cough, altered mental status or any concerns. No signs of systemic illness or infection. The patient is nontoxic-appearing on exam and vital signs are within normal limits.  I have reviewed the triage vital signs and the nursing notes. Pertinent labs & imaging results that were available during my care of the patient were reviewed by me and considered in my medical decision making (see chart for details). After history, exam, and medical workup I feel the patient has been appropriately medically screened and is safe for discharge home. Pertinent diagnoses were discussed with the patient. Patient was given return precautions.  Rx / DC Orders ED Discharge Orders     None         Mickeal Daws, MD 02/22/23 [phone]

## 2023-07-30 ENCOUNTER — Other Ambulatory Visit: Payer: Self-pay

## 2023-07-30 ENCOUNTER — Encounter (HOSPITAL_COMMUNITY): Payer: Self-pay | Admitting: Registered Nurse

## 2023-07-30 ENCOUNTER — Inpatient Hospital Stay (HOSPITAL_COMMUNITY)
Admission: AD | Admit: 2023-07-30 | Discharge: 2023-08-01 | DRG: 885 | Disposition: A | Discharge status: Other Acute Inpatient Hospital | Payer: Medicaid Other | Source: Intra-hospital | Attending: Psychiatry | Admitting: Psychiatry

## 2023-07-30 ENCOUNTER — Ambulatory Visit (HOSPITAL_COMMUNITY)
Admission: EM | Admit: 2023-07-30 | Discharge: 2023-07-30 | Disposition: A | Discharge status: Other Acute Inpatient Hospital | Payer: Medicaid Other | Attending: Registered Nurse | Admitting: Registered Nurse

## 2023-07-30 DIAGNOSIS — F333 Major depressive disorder, recurrent, severe with psychotic symptoms: Principal | ICD-10-CM | POA: Diagnosis present

## 2023-07-30 DIAGNOSIS — E669 Obesity, unspecified: Secondary | ICD-10-CM | POA: Diagnosis present

## 2023-07-30 DIAGNOSIS — Z79899 Other long term (current) drug therapy: Secondary | ICD-10-CM

## 2023-07-30 DIAGNOSIS — R45851 Suicidal ideations: Secondary | ICD-10-CM | POA: Insufficient documentation

## 2023-07-30 DIAGNOSIS — F17210 Nicotine dependence, cigarettes, uncomplicated: Secondary | ICD-10-CM | POA: Diagnosis present

## 2023-07-30 DIAGNOSIS — E7800 Pure hypercholesterolemia, unspecified: Secondary | ICD-10-CM | POA: Diagnosis present

## 2023-07-30 DIAGNOSIS — F1010 Alcohol abuse, uncomplicated: Secondary | ICD-10-CM | POA: Diagnosis present

## 2023-07-30 DIAGNOSIS — F1910 Other psychoactive substance abuse, uncomplicated: Secondary | ICD-10-CM | POA: Diagnosis present

## 2023-07-30 DIAGNOSIS — Z5901 Sheltered homelessness: Secondary | ICD-10-CM | POA: Diagnosis not present

## 2023-07-30 DIAGNOSIS — Z794 Long term (current) use of insulin: Secondary | ICD-10-CM | POA: Diagnosis not present

## 2023-07-30 DIAGNOSIS — Z5986 Financial insecurity: Secondary | ICD-10-CM | POA: Diagnosis not present

## 2023-07-30 DIAGNOSIS — F339 Major depressive disorder, recurrent, unspecified: Secondary | ICD-10-CM | POA: Diagnosis present

## 2023-07-30 DIAGNOSIS — Z7951 Long term (current) use of inhaled steroids: Secondary | ICD-10-CM

## 2023-07-30 DIAGNOSIS — F411 Generalized anxiety disorder: Secondary | ICD-10-CM | POA: Insufficient documentation

## 2023-07-30 DIAGNOSIS — Z5982 Transportation insecurity: Secondary | ICD-10-CM

## 2023-07-30 DIAGNOSIS — Z5941 Food insecurity: Secondary | ICD-10-CM | POA: Diagnosis not present

## 2023-07-30 DIAGNOSIS — Z634 Disappearance and death of family member: Secondary | ICD-10-CM | POA: Diagnosis not present

## 2023-07-30 DIAGNOSIS — Z886 Allergy status to analgesic agent status: Secondary | ICD-10-CM | POA: Diagnosis not present

## 2023-07-30 DIAGNOSIS — Z7984 Long term (current) use of oral hypoglycemic drugs: Secondary | ICD-10-CM

## 2023-07-30 DIAGNOSIS — J45909 Unspecified asthma, uncomplicated: Secondary | ICD-10-CM | POA: Diagnosis present

## 2023-07-30 DIAGNOSIS — E1140 Type 2 diabetes mellitus with diabetic neuropathy, unspecified: Secondary | ICD-10-CM | POA: Diagnosis present

## 2023-07-30 DIAGNOSIS — Z9151 Personal history of suicidal behavior: Secondary | ICD-10-CM | POA: Diagnosis not present

## 2023-07-30 DIAGNOSIS — I10 Essential (primary) hypertension: Secondary | ICD-10-CM | POA: Diagnosis present

## 2023-07-30 DIAGNOSIS — F4310 Post-traumatic stress disorder, unspecified: Secondary | ICD-10-CM | POA: Diagnosis present

## 2023-07-30 DIAGNOSIS — Z6841 Body Mass Index (BMI) 40.0 and over, adult: Secondary | ICD-10-CM | POA: Diagnosis not present

## 2023-07-30 DIAGNOSIS — G629 Polyneuropathy, unspecified: Secondary | ICD-10-CM | POA: Diagnosis present

## 2023-07-30 DIAGNOSIS — Z7982 Long term (current) use of aspirin: Secondary | ICD-10-CM

## 2023-07-30 DIAGNOSIS — F1410 Cocaine abuse, uncomplicated: Secondary | ICD-10-CM | POA: Diagnosis present

## 2023-07-30 DIAGNOSIS — N179 Acute kidney failure, unspecified: Principal | ICD-10-CM | POA: Diagnosis present

## 2023-07-30 DIAGNOSIS — Z91411 Personal history of adult psychological abuse: Secondary | ICD-10-CM

## 2023-07-30 DIAGNOSIS — Z91410 Personal history of adult physical and sexual abuse: Secondary | ICD-10-CM

## 2023-07-30 HISTORY — DX: Polyneuropathy, unspecified: G62.9

## 2023-07-30 HISTORY — DX: Pure hypercholesterolemia, unspecified: E78.00

## 2023-07-30 LAB — CBC WITH DIFFERENTIAL/PLATELET
Abs Immature Granulocytes: 0.07 10*3/uL (ref 0.00–0.07)
Basophils Absolute: 0.1 10*3/uL (ref 0.0–0.1)
Basophils Relative: 1 %
Eosinophils Absolute: 0.2 10*3/uL (ref 0.0–0.5)
Eosinophils Relative: 2 %
HCT: 35.5 % — ABNORMAL LOW (ref 36.0–46.0)
Hemoglobin: 11.1 g/dL — ABNORMAL LOW (ref 12.0–15.0)
Immature Granulocytes: 1 %
Lymphocytes Relative: 25 %
Lymphs Abs: 2.7 10*3/uL (ref 0.7–4.0)
MCH: 28 pg (ref 26.0–34.0)
MCHC: 31.3 g/dL (ref 30.0–36.0)
MCV: 89.6 fL (ref 80.0–100.0)
Monocytes Absolute: 0.7 10*3/uL (ref 0.1–1.0)
Monocytes Relative: 7 %
Neutro Abs: 7.1 10*3/uL (ref 1.7–7.7)
Neutrophils Relative %: 64 %
Platelets: 442 10*3/uL — ABNORMAL HIGH (ref 150–400)
RBC: 3.96 MIL/uL (ref 3.87–5.11)
RDW: 12.7 % (ref 11.5–15.5)
WBC: 10.8 10*3/uL — ABNORMAL HIGH (ref 4.0–10.5)
nRBC: 0 % (ref 0.0–0.2)

## 2023-07-30 LAB — COMPREHENSIVE METABOLIC PANEL
ALT: 11 U/L (ref 0–44)
AST: 12 U/L — ABNORMAL LOW (ref 15–41)
Albumin: 3.2 g/dL — ABNORMAL LOW (ref 3.5–5.0)
Alkaline Phosphatase: 98 U/L (ref 38–126)
Anion gap: 18 — ABNORMAL HIGH (ref 5–15)
BUN: 13 mg/dL (ref 6–20)
CO2: 23 mmol/L (ref 22–32)
Calcium: 9.4 mg/dL (ref 8.9–10.3)
Chloride: 99 mmol/L (ref 98–111)
Creatinine, Ser: 1.92 mg/dL — ABNORMAL HIGH (ref 0.44–1.00)
GFR, Estimated: 30 mL/min — ABNORMAL LOW (ref >60)
Glucose, Bld: 235 mg/dL — ABNORMAL HIGH (ref 70–99)
Potassium: 4 mmol/L (ref 3.5–5.1)
Sodium: 140 mmol/L (ref 135–145)
Total Bilirubin: 0.7 mg/dL (ref 0.3–1.2)
Total Protein: 7.8 g/dL (ref 6.5–8.1)

## 2023-07-30 LAB — HEMOGLOBIN A1C
Hgb A1c MFr Bld: 11 % — ABNORMAL HIGH (ref 4.8–5.6)
Mean Plasma Glucose: 269 mg/dL

## 2023-07-30 LAB — TSH: TSH: 5.182 u[IU]/mL — ABNORMAL HIGH (ref 0.350–4.500)

## 2023-07-30 LAB — LIPID PANEL
Cholesterol: 175 mg/dL (ref 0–200)
HDL: 41 mg/dL (ref >40)
LDL Cholesterol: 98 mg/dL (ref 0–99)
Total CHOL/HDL Ratio: 4.3 ratio
Triglycerides: 181 mg/dL — ABNORMAL HIGH (ref <150)
VLDL: 36 mg/dL (ref 0–40)

## 2023-07-30 LAB — GLUCOSE, CAPILLARY: Glucose-Capillary: 281 mg/dL — ABNORMAL HIGH (ref 70–99)

## 2023-07-30 LAB — MAGNESIUM: Magnesium: 1.6 mg/dL — ABNORMAL LOW (ref 1.7–2.4)

## 2023-07-30 LAB — ETHANOL: Alcohol, Ethyl (B): <10 mg/dL (ref <10)

## 2023-07-30 LAB — POC URINE PREG, ED: Preg Test, Ur: NEGATIVE

## 2023-07-30 MED ORDER — EXENATIDE ER 2 MG/0.85ML ~~LOC~~ AUIJ: 2.0000 mg | AUTO-INJECTOR | SUBCUTANEOUS | Status: DC

## 2023-07-30 MED ORDER — HYDROCHLOROTHIAZIDE 12.5 MG PO TABS
25.0000 mg | ORAL_TABLET | Freq: Every day | ORAL | Status: DC
  Administered 2023-07-31 – 2023-08-01 (×2): 25 mg via ORAL
  Filled 2023-07-30 (×3): qty 1

## 2023-07-30 MED ORDER — NAPHAZOLINE-GLYCERIN 0.012-0.25 % OP SOLN: 2.0000 [drp] | Freq: Four times a day (QID) | OPHTHALMIC | Status: DC | PRN

## 2023-07-30 MED ORDER — FLUOXETINE HCL 20 MG PO CAPS
40.0000 mg | ORAL_CAPSULE | Freq: Every day | ORAL | Status: DC
  Administered 2023-07-31: 40 mg via ORAL
  Filled 2023-07-30 (×2): qty 2

## 2023-07-30 MED ORDER — ALBUTEROL SULFATE HFA 108 (90 BASE) MCG/ACT IN AERS: 2.0000 | INHALATION_SPRAY | RESPIRATORY_TRACT | Status: DC | PRN

## 2023-07-30 MED ORDER — POTASSIUM CHLORIDE CRYS ER 10 MEQ PO TBCR
10.0000 meq | EXTENDED_RELEASE_TABLET | Freq: Every day | ORAL | Status: DC
  Filled 2023-07-30: qty 1

## 2023-07-30 MED ORDER — FLUTICASONE PROPIONATE 50 MCG/ACT NA SUSP: 1.0000 | Freq: Every day | NASAL | Status: DC | PRN

## 2023-07-30 MED ORDER — INSULIN GLARGINE-YFGN 100 UNIT/ML ~~LOC~~ SOLN: 28.0000 [IU] | Freq: Every day | SUBCUTANEOUS | Status: DC

## 2023-07-30 MED ORDER — METFORMIN HCL 500 MG PO TABS: 1000.0000 mg | ORAL_TABLET | Freq: Two times a day (BID) | ORAL | Status: DC

## 2023-07-30 MED ORDER — DIPHENHYDRAMINE HCL 50 MG/ML IJ SOLN: 50.0000 mg | Freq: Three times a day (TID) | INTRAMUSCULAR | Status: DC | PRN

## 2023-07-30 MED ORDER — FLUOXETINE HCL 20 MG PO CAPS
40.0000 mg | ORAL_CAPSULE | Freq: Every day | ORAL | Status: DC
  Filled 2023-07-30: qty 2

## 2023-07-30 MED ORDER — LOSARTAN POTASSIUM 25 MG PO TABS
100.0000 mg | ORAL_TABLET | Freq: Every day | ORAL | Status: DC
  Administered 2023-07-31 – 2023-08-01 (×2): 100 mg via ORAL
  Filled 2023-07-30 (×3): qty 2

## 2023-07-30 MED ORDER — PREGABALIN 50 MG PO CAPS
100.0000 mg | ORAL_CAPSULE | Freq: Two times a day (BID) | ORAL | Status: DC
  Administered 2023-07-30 – 2023-08-01 (×4): 100 mg via ORAL
  Filled 2023-07-30 (×4): qty 1

## 2023-07-30 MED ORDER — MONTELUKAST SODIUM 10 MG PO TABS: 10.0000 mg | ORAL_TABLET | Freq: Every day | ORAL | Status: DC

## 2023-07-30 MED ORDER — NITROGLYCERIN 0.4 MG SL SUBL: 0.4000 mg | SUBLINGUAL_TABLET | SUBLINGUAL | Status: DC | PRN

## 2023-07-30 MED ORDER — MAGNESIUM HYDROXIDE 400 MG/5ML PO SUSP: 30.0000 mL | Freq: Every day | ORAL | Status: DC | PRN

## 2023-07-30 MED ORDER — AMLODIPINE BESYLATE 10 MG PO TABS: 10.0000 mg | ORAL_TABLET | Freq: Every day | ORAL | Status: DC

## 2023-07-30 MED ORDER — ALUM & MAG HYDROXIDE-SIMETH 200-200-20 MG/5ML PO SUSP: 30.0000 mL | ORAL | Status: DC | PRN

## 2023-07-30 MED ORDER — VITAMIN B1 100 MG PO TABS
100.0000 mg | ORAL_TABLET | Freq: Every day | ORAL | Status: DC
  Filled 2023-07-30: qty 1

## 2023-07-30 MED ORDER — AMLODIPINE BESYLATE 5 MG PO TABS
10.0000 mg | ORAL_TABLET | Freq: Every day | ORAL | Status: DC
  Administered 2023-07-31 – 2023-08-01 (×2): 10 mg via ORAL
  Filled 2023-07-30 (×3): qty 1

## 2023-07-30 MED ORDER — ATORVASTATIN CALCIUM 40 MG PO TABS
80.0000 mg | ORAL_TABLET | Freq: Every day | ORAL | Status: DC
  Administered 2023-07-31 – 2023-08-01 (×2): 80 mg via ORAL
  Filled 2023-07-30 (×3): qty 1

## 2023-07-30 MED ORDER — ACETAMINOPHEN 325 MG PO TABS
650.0000 mg | ORAL_TABLET | Freq: Four times a day (QID) | ORAL | Status: DC | PRN
  Administered 2023-07-31: 650 mg via ORAL
  Filled 2023-07-30: qty 2

## 2023-07-30 MED ORDER — DIPHENHYDRAMINE HCL 25 MG PO CAPS: 50.0000 mg | ORAL_CAPSULE | Freq: Three times a day (TID) | ORAL | Status: DC | PRN

## 2023-07-30 MED ORDER — MOMETASONE FURO-FORMOTEROL FUM 200-5 MCG/ACT IN AERO
2.0000 | INHALATION_SPRAY | Freq: Two times a day (BID) | RESPIRATORY_TRACT | Status: DC
  Filled 2023-07-30: qty 8.8

## 2023-07-30 MED ORDER — MONTELUKAST SODIUM 10 MG PO TABS
10.0000 mg | ORAL_TABLET | Freq: Every day | ORAL | Status: DC
  Administered 2023-07-30 – 2023-07-31 (×2): 10 mg via ORAL
  Filled 2023-07-30 (×4): qty 1

## 2023-07-30 MED ORDER — ARIPIPRAZOLE 5 MG PO TABS
5.0000 mg | ORAL_TABLET | Freq: Every day | ORAL | Status: DC
  Administered 2023-07-31 – 2023-08-01 (×2): 5 mg via ORAL
  Filled 2023-07-30 (×3): qty 1

## 2023-07-30 MED ORDER — INSULIN ASPART 100 UNIT/ML IJ SOLN
0.0000 [IU] | Freq: Three times a day (TID) | INTRAMUSCULAR | Status: DC
  Administered 2023-07-31: 3 [IU] via SUBCUTANEOUS
  Administered 2023-07-31: 4 [IU] via SUBCUTANEOUS
  Administered 2023-07-31: 7 [IU] via SUBCUTANEOUS
  Administered 2023-08-01: 3 [IU] via SUBCUTANEOUS
  Administered 2023-08-01: 4 [IU] via SUBCUTANEOUS
  Filled 2023-07-30: qty 0.2

## 2023-07-30 MED ORDER — INSULIN ASPART 100 UNIT/ML IJ SOLN: 0.0000 [IU] | Freq: Three times a day (TID) | INTRAMUSCULAR | Status: DC

## 2023-07-30 MED ORDER — POTASSIUM CHLORIDE CRYS ER 10 MEQ PO TBCR
10.0000 meq | EXTENDED_RELEASE_TABLET | Freq: Every day | ORAL | Status: DC
  Administered 2023-07-31 – 2023-08-01 (×2): 10 meq via ORAL
  Filled 2023-07-30 (×3): qty 1

## 2023-07-30 MED ORDER — ATORVASTATIN CALCIUM 40 MG PO TABS: 80.0000 mg | ORAL_TABLET | Freq: Every day | ORAL | Status: DC

## 2023-07-30 MED ORDER — HYDROCHLOROTHIAZIDE 25 MG PO TABS: 25.0000 mg | ORAL_TABLET | Freq: Every day | ORAL | Status: DC

## 2023-07-30 MED ORDER — PNEUMOCOCCAL 20-VAL CONJ VACC 0.5 ML IM SUSY
0.5000 mL | PREFILLED_SYRINGE | INTRAMUSCULAR | Status: DC
  Filled 2023-07-30 (×2): qty 0.5

## 2023-07-30 MED ORDER — LOSARTAN POTASSIUM 50 MG PO TABS: 100.0000 mg | ORAL_TABLET | Freq: Every day | ORAL | Status: DC

## 2023-07-30 MED ORDER — ARIPIPRAZOLE 5 MG PO TABS
5.0000 mg | ORAL_TABLET | Freq: Every day | ORAL | Status: DC
  Filled 2023-07-30: qty 1

## 2023-07-30 MED ORDER — ACETAMINOPHEN 325 MG PO TABS: 650.0000 mg | ORAL_TABLET | Freq: Four times a day (QID) | ORAL | Status: DC | PRN

## 2023-07-30 MED ORDER — VITAMIN B1 100 MG PO TABS
100.0000 mg | ORAL_TABLET | Freq: Every day | ORAL | Status: DC
  Administered 2023-07-31 – 2023-08-01 (×2): 100 mg via ORAL
  Filled 2023-07-30 (×3): qty 1

## 2023-07-30 MED ORDER — PREGABALIN 50 MG PO CAPS
100.0000 mg | ORAL_CAPSULE | Freq: Two times a day (BID) | ORAL | Status: DC
  Filled 2023-07-30: qty 2

## 2023-07-30 MED ORDER — NICOTINE POLACRILEX 2 MG MT GUM
2.0000 mg | CHEWING_GUM | OROMUCOSAL | Status: DC | PRN
  Administered 2023-07-31 – 2023-08-01 (×2): 2 mg via ORAL
  Filled 2023-07-30 (×2): qty 1

## 2023-07-30 MED ORDER — TORSEMIDE 20 MG PO TABS
40.0000 mg | ORAL_TABLET | Freq: Two times a day (BID) | ORAL | Status: DC
  Administered 2023-07-31: 40 mg via ORAL
  Filled 2023-07-30 (×4): qty 2

## 2023-07-30 MED ORDER — MOMETASONE FURO-FORMOTEROL FUM 200-5 MCG/ACT IN AERO
2.0000 | INHALATION_SPRAY | Freq: Two times a day (BID) | RESPIRATORY_TRACT | Status: DC
  Administered 2023-07-30 – 2023-08-01 (×4): 2 via RESPIRATORY_TRACT
  Filled 2023-07-30 (×2): qty 8.8

## 2023-07-30 MED ORDER — TORSEMIDE 20 MG PO TABS: 40.0000 mg | ORAL_TABLET | Freq: Two times a day (BID) | ORAL | Status: DC

## 2023-07-30 MED ORDER — INSULIN GLARGINE-YFGN 100 UNIT/ML ~~LOC~~ SOLN
28.0000 [IU] | Freq: Every day | SUBCUTANEOUS | Status: DC
  Administered 2023-07-30 – 2023-07-31 (×2): 28 [IU] via SUBCUTANEOUS
  Filled 2023-07-30: qty 0.28

## 2023-07-30 NOTE — Progress Notes (Signed)
Pt admitted voluntarily to [facility] inpatient adult unit.  Pt states she was previously admitted to this unit approximately 7-8 years ago.  Pt admitted d/t endorsing SI and racing thoughts x 1 week.  Pt also experiences AVH.  Auditory hallucinations are the voice of  her sister (who is deceased) telling her to "come on and join me" and visual hallucinations of seeing bugs.  Pt reports stressor being the fact she is living in a motel since April and paying $338 weekly.  She is now financially unable to keep up with the payments.  Pt also verbalized food insecurities.  Pt lives with boyfriend.  Pt continues to endorse SI; contracts for safety.  Pt re-oriented to unit.  Pt safe on unit.

## 2023-07-30 NOTE — ED Notes (Signed)
Patient admitted to observation due to suicidal ideation and cocaine/THC use.  Patient is calm and cooperative with admission process.  She contracts for safety.  Will monitor and provide safe supportive environment.

## 2023-07-30 NOTE — Progress Notes (Signed)
Pt has been accepted to [facility] TODAY 07/30/2023, pending labs, voluntary consent, EKG, UDS. Bed assignment: 305-2  Pt meets inpatient criteria per Assunta Found, NP  Attending Physician will be Phineas Inches, MD  Report can be called to: - Adult unit: [phone]  Pt can arrive after pending items are received  Care Team Notified: [facility] Rona Ravens, RN, Assunta Found, NP, and Jenean Lindau, RN  [place], [facility]

## 2023-07-30 NOTE — ED Provider Notes (Signed)
[facility]al [facility] Urgent Care Medical Screening Exam  Patient Name: Hailey Klein MRN: [id number] Date of Evaluation: 07/30/23 Chief Complaint:   Diagnosis:  Final diagnoses:  MDD (major depressive disorder), recurrent, severe, with psychosis (HCC)  Polysubstance abuse (HCC)  Suicidal ideation    History of Present illness: Hailey Klein is a 57 y.o. female patient presented to [facility] voluntarily via [employer] as a walk in with complaints of depression and suicidal ideation  Hailey Klein, 57 y.o., female patient seen face to face by this provider, chart reviewed, and consulted with Dr. Nelly Rout on 07/30/23.  On evaluation Hailey Klein reports a history of depression and anxiety.  States she has been having suicidal thoughts with no specific plan but is unable to contract for safety.  Reports she is also having racing thoughts "I try to just think of one thing but I can't it's like everything is colliding together.  Patient reports a history of multiple suicide attempts last "was about 10 yrs ago" with psychiatric hospitalization.  Patient states that she is prescribed Abilify and Prozac by her primary care provider.  Reports she is compliant with medications and last taken was this morning.  "I don't have a primary care provider now but I still have refills."  Patient states that she doesn't have outpatient psychiatric services at this time.  Patient denies self-harming behaviors.  She states that she does use cocaine and marijuana.  Cocaine daily and marijuana "about once a week.  Last use of both was "last night."  Patient denies homicidal ideation although during triage she reported she was having homicidal thoughts.  Patient endorses auditory/visual hallucinations stating she is hearing the voices of deceased family members telling her to join them.  Last heard voices this morning "It was my sister telling me to come be with her."  Patient also states that she is seeing  bugs that are not there.  Patient states that she has had a decrease in appetite and has lost 20 pounds over the last 1 to 2 months.  Reports she is not sleeping well getting about "5 hours" of sleep nightly.  Also reporting feeling worthlessness, hopelessness and wanting to die.   Patient reports she is on disability receiving SSI.  States she is currently living in a motel with her boyfriend.  Patient states that her boyfriend is supportive but doesn't have any family support.    During evaluation Hailey Klein is seated in exam room with no noted distress.  She is alert/oriented x 4, calm, cooperative, attentive, and responses were relevant and appropriate to assessment questions.  She spoke in a clear tone at moderate volume, and normal pace, with good eye contact.  She denies homicidal ideation and paranoia but endorses psychosis, and suicidal ideation with no specific plan or intent.  However she is unable to contract for safety.  Objectively there is no evidence of psychosis/mania or delusional thinking.  However patient continues to endorse auditory/visual hallucinations.  She conversed coherently, with goal directed thoughts, and no distractibility, or pre-occupation.  Patient recommended for inpatient psychiatric treatment for safety and stabilization.     Flowsheet Row ED from 07/30/2023 in [facility] ED from 02/21/2023 in [facility] Emergency Department at [facility] ED from 02/18/2023 in [facility] Emergency Department at [facility]  C-SSRS RISK CATEGORY Moderate Risk No Risk No Risk       Psychiatric Specialty Exam  Presentation  General Appearance:Appropriate for Environment  Eye Contact:Good  Speech:Clear and Coherent; Normal Rate  Speech Volume:Normal  Handedness:Right   Mood and Affect  Mood: Anxious; Depressed; Hopeless; Worthless  Affect: Congruent; Depressed   Thought Process  Thought Processes: Coherent; Goal  Directed  Descriptions of Associations:Intact  Orientation:Full (Time, Place and Person)  Thought Content:Logical  Diagnosis of Schizophrenia or Schizoaffective disorder in past: No   Hallucinations:Auditory; Visual Reports she is hearing the voices of deceased family members telling her to come join them "I'm seeing bugs that are not there."  Ideas of Reference:None  Suicidal Thoughts:Yes, Active Without Intent; Without Plan  Homicidal Thoughts:No (Denies at this time but during triage reported she was having thoughts without intent or plan)   Sensorium  Memory: Immediate Good; Recent Good; Remote Good  Judgment: Fair  Insight: Fair; Present   Executive Functions  Concentration: Good  Attention Span: Good  Recall: Good  Fund of Knowledge: Good  Language: Good   Psychomotor Activity  Psychomotor Activity: Normal   Assets  Assets: Communication Skills; Desire for Improvement; Financial Resources/Insurance; Leisure Time; Physical Health; Social Support   Sleep  Sleep: Fair  Number of hours:  5   Physical Exam: Physical Exam Vitals and nursing note reviewed.  Constitutional:      General: She is not in acute distress.    Appearance: Normal appearance. She is not ill-appearing.  HENT:     Head: Normocephalic.  Eyes:     Conjunctiva/sclera: Conjunctivae normal.  Cardiovascular:     Rate and Rhythm: Normal rate.  Pulmonary:     Effort: Pulmonary effort is normal. No respiratory distress.  Musculoskeletal:        General: Normal range of motion.     Cervical back: Normal range of motion.  Skin:    General: Skin is warm and dry.  Neurological:     Mental Status: She is alert and oriented to person, place, and time.  Psychiatric:        Attention and Perception: Attention normal. She perceives auditory and visual hallucinations.        Mood and Affect: Mood is anxious and depressed.        Speech: Speech normal.        Behavior:  Behavior normal. Behavior is cooperative.        Thought Content: Thought content is not paranoid or delusional. Thought content includes suicidal ideation. Thought content does not include homicidal ideation. Thought content does not include suicidal plan.        Cognition and Memory: Cognition normal.        Judgment: Judgment is impulsive.    Review of Systems  Constitutional:        No other complaints voiced  Cardiovascular:  Negative for chest pain.       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  Last use was "last night") and suicidal ideas (No specific plan.  Prior suicide attempts). The patient is nervous/anxious and has insomnia.   All other systems reviewed and are negative.  Blood pressure 124/73, pulse 90, temperature 99.4 F (37.4 C), temperature source Oral, resp. rate 18, last menstrual period 07/21/2015, SpO2 100%. There is no height or weight on file to calculate BMI.  Musculoskeletal: Strength & Muscle Tone: within normal limits Gait &  Station: normal Patient leans: N/A   [facility] MSE Discharge Disposition for Follow up and Recommendations: Based on my evaluation I certify that psychiatric inpatient services furnished can reasonably be expected to improve the patient's condition which I recommend transfer to an appropriate accepting facility.    Hailey Cardamone, NP 07/30/2023, 10:29 AM

## 2023-07-30 NOTE — Progress Notes (Signed)
   07/30/23 0846  [facility] Triage Screening (Walk-ins at [facility] only)  How Did You Hear About [place]? Legal System  What Is the Reason for Your Visit/Call Today? Pt arrived to [facility] voluntarily via [employer] [facility]. Pt states that she is having racing suicidal thoughts and just feel like dying. Pt states that she keeps having bad dreams of people in her life that have already passed and feels like they are telling her to come be with them. Pt states that she does not have a plan at this time. Pt states that she is compliant with her medication, but unsure of the names which are prescribed by PCP. Pt has not seen a therapist in over a year. Pt endorses SI, HI and VH at this presesnt time. Pt states that she told her boyfriend not to close his eyes or go to sleep because she's not going to be responsible for what may happen to him. Pt states that she used an unknown amount of cocaine last night but no alcohol.  How Long Has This Been Causing You Problems? 1 wk - 1 month  Have You Recently Had Any Thoughts About Hurting Yourself? Yes  How long ago did you have thoughts about hurting yourself? yesterday  Are You Planning to Commit Suicide/Harm Yourself At This time?  (pt states the thoughts come and go)  Have you Recently Had Thoughts About Hurting Someone Karolee Ohs? Yes  How long ago did you have thoughts of harming others? last night - her boyfriend  Are You Planning To Harm Someone At This Time? No  Are you currently experiencing any auditory, visual or other hallucinations? Yes  Please explain the hallucinations you are currently experiencing: visual - dead people and bugs  Have You Used Any Alcohol or Drugs in the Past 24 Hours? Yes  How long ago did you use Drugs or Alcohol? last night  What Did You Use and How Much? cocaine - unknown amount  Do you have any current medical co-morbidities that require immediate attention? No  Clinician description of patient physical appearance/behavior: calm, cooperative  What Do  You Feel Would Help You the Most Today? Treatment for Depression or other mood problem;Social Support  If access to [facility] was not available, would you have sought care in the Emergency Department? Yes  Determination of Need Urgent (48 hours)  Options For Referral Inpatient Hospitalization;Intensive Outpatient Therapy

## 2023-07-30 NOTE — ED Provider Notes (Signed)
[facility] Continuous Assessment Admission H&P  Date: 07/30/23 Patient Name: Hailey Klein MRN: [id number] Chief Complaint: depression, suicidal ideation  Diagnoses:  Final diagnoses:  MDD (major depressive disorder), recurrent, severe, with psychosis (HCC)  Polysubstance abuse (HCC)  Suicidal ideation    HPI: History of Present illness: Hailey Klein is a 57 y.o. female patient presented to [facility] voluntarily via [employer] as a walk in with complaints of depression and suicidal ideation   Hailey Klein, 57 y.o., female patient seen face to face by this provider, chart reviewed, and consulted with Dr. Nelly Rout on 07/30/23.  On evaluation Hailey Klein reports a history of depression and anxiety.  States she has been having suicidal thoughts with no specific plan but is unable to contract for safety.  Reports she is also having racing thoughts "I try to just think of one thing but I can't it's like everything is colliding together.  Patient reports a history of multiple suicide attempts last "was about 10 yrs ago" with psychiatric hospitalization.  Patient states that she is prescribed Abilify and Prozac by her primary care provider.  Reports she is compliant with medications and last taken was this morning.  "I don't have a primary care provider now but I still have refills."  Patient states that she doesn't have outpatient psychiatric services at this time.  Patient denies self-harming behaviors.  She states that she does use cocaine and marijuana.  Cocaine daily and marijuana "about once a week.  Last use of both was "last night."  Patient denies homicidal ideation although during triage she reported she was having homicidal thoughts.  Patient endorses auditory/visual hallucinations stating she is hearing the voices of deceased family members telling her to join them.  Last heard voices this morning "It was my sister telling me to come be with her."  Patient also  states that she is seeing bugs that are not there.  Patient states that she has had a decrease in appetite and has lost 20 pounds over the last 1 to 2 months.  Reports she is not sleeping well getting about "5 hours" of sleep nightly.  Also reporting feeling worthlessness, hopelessness and wanting to die.   Patient reports she is on disability receiving SSI.  States she is currently living in a motel with her boyfriend.  Patient states that her boyfriend is supportive but doesn't have any family support.    During evaluation Hailey Klein is seated in exam room with no noted distress.  She is alert/oriented x 4, calm, cooperative, attentive, and responses were relevant and appropriate to assessment questions.  She spoke in a clear tone at moderate volume, and normal pace, with good eye contact.  She denies homicidal ideation and paranoia but endorses psychosis, and suicidal ideation with no specific plan or intent.  However she is unable to contract for safety.  Objectively there is no evidence of psychosis/mania or delusional thinking.  However patient continues to endorse auditory/visual hallucinations.  She conversed coherently, with goal directed thoughts, and no distractibility, or pre-occupation.  Patient recommended for inpatient psychiatric treatment for safety and stabilization.      Total Time spent with patient: 45 minutes  Musculoskeletal  Strength & Muscle Tone: within normal limits Gait & Station: normal Patient leans: N/A  Psychiatric Specialty Exam  Presentation General Appearance:  Appropriate for Environment  Eye Contact: Good  Speech: Clear and Coherent; Normal Rate  Speech Volume: Normal  Handedness: Right   Mood and Affect  Mood: Anxious; Depressed; Hopeless; Worthless  Affect: Congruent; Depressed   Thought Process  Thought Processes: Coherent; Goal Directed  Descriptions of Associations:Intact  Orientation:Full (Time, Place and Person)  Thought  Content:Logical  Diagnosis of Schizophrenia or Schizoaffective disorder in past: No   Hallucinations:Hallucinations: Auditory; Visual Description of Auditory Hallucinations: Reports she is hearing the voices of deceased family members telling her to come join them Description of Visual Hallucinations: "I'm seeing bugs that are not there."  Ideas of Reference:None  Suicidal Thoughts:Suicidal Thoughts: Yes, Active SI Active Intent and/or Plan: Without Intent; Without Plan  Homicidal Thoughts:Homicidal Thoughts: No (Denies at this time but during triage reported she was having thoughts without intent or plan)   Sensorium  Memory: Immediate Good; Recent Good; Remote Good  Judgment: Fair  Insight: Fair; Present   Executive Functions  Concentration: Good  Attention Span: Good  Recall: Good  Fund of Knowledge: Good  Language: Good   Psychomotor Activity  Psychomotor Activity: Psychomotor Activity: Normal   Assets  Assets: Communication Skills; Desire for Improvement; Financial Resources/Insurance; Leisure Time; Physical Health; Social Support   Sleep  Sleep: Sleep: Fair Number of Hours of Sleep: 5   Nutritional Assessment (For OBS and FBC admissions only) Has the patient had a weight loss or gain of 10 pounds or more in the last 3 months?: Yes (Reports she has lost 20 pounds in the last 1-2 months) Has the patient had a decrease in food intake/or appetite?: Yes Does the patient have dental problems?: No Does the patient have eating habits or behaviors that may be indicators of an eating disorder including binging or inducing vomiting?: No Has the patient recently lost weight without trying?: 2 Has the patient been eating poorly because of a decreased appetite?: 1 Malnutrition Screening Tool Score: 3 Nutritional Assessment Referrals: Refer to Primary Care Provider    Physical Exam Vitals and nursing note reviewed.  Constitutional:      General: She is  not in acute distress.    Appearance: Normal appearance. She is not ill-appearing.  HENT:     Head: Normocephalic.  Eyes:     Conjunctiva/sclera: Conjunctivae normal.  Cardiovascular:     Rate and Rhythm: Normal rate.  Pulmonary:     Effort: Pulmonary effort is normal. No respiratory distress.  Musculoskeletal:        General: Normal range of motion.     Cervical back: Normal range of motion.  Skin:    General: Skin is warm and dry.  Neurological:     Mental Status: She is alert and oriented to person, place, and time.  Psychiatric:        Attention and Perception: Attention normal. She perceives auditory and visual hallucinations.        Mood and Affect: Mood is anxious and depressed.        Speech: Speech normal.        Behavior: Behavior normal. Behavior is cooperative.        Thought Content: Thought content is not paranoid or delusional. Thought content includes suicidal ideation. Thought content does not include homicidal ideation. Thought content does not include suicidal plan.        Cognition and Memory: Cognition normal.        Judgment: Judgment is impulsive.    Review of Systems  Constitutional:        No other complaints voiced  Cardiovascular:  Negative for chest pain.       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  Last use was "last night") and suicidal ideas (No specific plan.  Prior suicide attempts). The patient is nervous/anxious and has insomnia.   All other systems reviewed and are negative.   Blood pressure 124/73, pulse 90, temperature 99.4 F (37.4 C), temperature source Oral, resp. rate 18, last menstrual period 07/21/2015, SpO2 100%. There is no height or weight on file to calculate BMI.  Past Psychiatric History:  Patient Active Problem List    Diagnosis Date Noted   Polysubstance abuse (HCC) 07/30/2023   Suicidal ideation 07/30/2023   Hypokalemia 02/02/2023   Hypomagnesemia 02/02/2023   Chest pain at rest 02/01/2023   Renal vein thrombosis (HCC) 06/23/2022   Class 3 obesity (HCC) 06/22/2022   Acute on chronic diastolic CHF (congestive heart failure) (HCC) 06/20/2022   AKI (acute kidney injury) (HCC) 06/20/2022   Essential hypertension 06/20/2022   Type 2 diabetes mellitus with hyperlipidemia (HCC) 06/20/2022   Acute gout 06/19/2022   Alcohol use disorder, moderate, in sustained remission (HCC) 12/02/2015   Cocaine abuse (HCC) 12/02/2015   Opioid use disorder, moderate, in sustained remission (HCC) 12/02/2015   Cannabis use disorder, mild, abuse 12/02/2015   PTSD (post-traumatic stress disorder) 12/01/2015   MDD (major depressive disorder), recurrent, severe, with psychosis (HCC) 12/01/2015      Is the patient at risk to self? Yes  Has the patient been a risk to self in the past 6 months? No .    Has the patient been a risk to self within the distant past? Yes   Is the patient a risk to others? No   Has the patient been a risk to others in the past 6 months? No   Has the patient been a risk to others within the distant past? No   Past Medical History:  Past Medical History:  Diagnosis Date   Diabetes mellitus without complication (HCC)    H/O suicide attempt    cut wrists - 19-y-o   Hypertension    PTSD (post-traumatic stress disorder)      Family History:  Family History  Problem Relation Age of Onset   Alcoholism Other      Social History:  Social History   Tobacco Use   Smoking status: Every Day    Current packs/day: 0.50    Types: Cigarettes  Vaping Use   Vaping status: Never Used  Substance Use Topics   Alcohol use: Not Currently   Drug use: Not Currently    Types: Marijuana    Comment: 1 month ago     Last Labs:  Admission on 02/21/2023, Discharged on 02/22/2023  Component Date Value Ref  Range Status   Sodium 02/21/2023 132 (L)  135 - 145 mmol/L Final   Potassium 02/21/2023 3.1 (L)  3.5 - 5.1 mmol/L Final   Chloride 02/21/2023 91 (L)  98 - 111 mmol/L Final   CO2 02/21/2023 26  22 - 32 mmol/L Final   Glucose, Bld 02/21/2023 248 (H)  70 - 99 mg/dL Final   Glucose reference range applies only to samples taken after fasting for at least 8 hours.   BUN 02/21/2023 8  6 - 20 mg/dL Final   Creatinine, Ser 02/21/2023 1.17 (H)  0.44 - 1.00 mg/dL Final   Calcium 09/81/1914 8.4 (L)  8.9 - 10.3 mg/dL  Final   Total Protein 02/21/2023 7.4  6.5 - 8.1 g/dL Final   Albumin 41/32/4401 2.9 (L)  3.5 - 5.0 g/dL Final   AST 02/72/5366 10 (L)  15 - 41 U/L Final   ALT 02/21/2023 14  0 - 44 U/L Final   Alkaline Phosphatase 02/21/2023 102  38 - 126 U/L Final   Total Bilirubin 02/21/2023 0.8  0.3 - 1.2 mg/dL Final   GFR, Estimated 02/21/2023 55 (L)  >60 mL/min Final   Comment: (NOTE) Calculated using the CKD-EPI Creatinine Equation (2021)    Anion gap 02/21/2023 15  5 - 15 Final   Performed at [facility], [address] [place], [place] [address]   WBC 02/21/2023 8.5  4.0 - 10.5 K/uL Final   RBC 02/21/2023 4.02  3.87 - 5.11 MIL/uL Final   Hemoglobin 02/21/2023 10.9 (L)  12.0 - 15.0 g/dL Final   HCT 74/25/9563 34.8 (L)  36.0 - 46.0 % Final   MCV 02/21/2023 86.6  80.0 - 100.0 fL Final   MCH 02/21/2023 27.1  26.0 - 34.0 pg Final   MCHC 02/21/2023 31.3  30.0 - 36.0 g/dL Final   RDW 87/56/4332 14.0  11.5 - 15.5 % Final   Platelets 02/21/2023 370  150 - 400 K/uL Final   nRBC 02/21/2023 0.0  0.0 - 0.2 % Final   Neutrophils Relative % 02/21/2023 62  % Final   Neutro Abs 02/21/2023 5.2  1.7 - 7.7 K/uL Final   Lymphocytes Relative 02/21/2023 28  % Final   Lymphs Abs 02/21/2023 2.3  0.7 - 4.0 K/uL Final   Monocytes Relative 02/21/2023 8  % Final   Monocytes Absolute 02/21/2023 0.7  0.1 - 1.0 K/uL Final   Eosinophils Relative 02/21/2023 1  % Final   Eosinophils Absolute 02/21/2023 0.1  0.0 -  0.5 K/uL Final   Basophils Relative 02/21/2023 1  % Final   Basophils Absolute 02/21/2023 0.1  0.0 - 0.1 K/uL Final   Immature Granulocytes 02/21/2023 0  % Final   Abs Immature Granulocytes 02/21/2023 0.02  0.00 - 0.07 K/uL Final   Performed at [facility], [address] [place], [place] [address]   Troponin I (High Sensitivity) 02/21/2023 16  <18 ng/L Final   Comment: (NOTE) Elevated high sensitivity troponin I (hsTnI) values and significant  changes across serial measurements may suggest ACS but many other  chronic and acute conditions are known to elevate hsTnI results.  Refer to the "Links" section for chest pain algorithms and additional  guidance. Performed at [facility], [address] [place], [place] [address]    Troponin I (High Sensitivity) 02/22/2023 16  <18 ng/L Final   Comment: (NOTE) Elevated high sensitivity troponin I (hsTnI) values and significant  changes across serial measurements may suggest ACS but many other  chronic and acute conditions are known to elevate hsTnI results.  Refer to the "Links" section for chest pain algorithms and additional  guidance. Performed at [facility], [address] [place], [place] [address]   Admission on 02/18/2023, Discharged on 02/18/2023  Component Date Value Ref Range Status   Troponin I (High Sensitivity) 02/18/2023 29 (H)  <18 ng/L Final   Comment: (NOTE) Elevated high sensitivity troponin I (hsTnI) values and significant  changes across serial measurements may suggest ACS but many other  chronic and acute conditions are known to elevate hsTnI results.  Refer to the "Links" section for chest pain algorithms and  additional  guidance. Performed at [facility], [address], [place], [place] [address]    Sodium 02/18/2023 136  135 - 145 mmol/L Final   Potassium 02/18/2023 3.3 (L)  3.5 - 5.1 mmol/L Final   Chloride 02/18/2023 96 (L)  98 - 111 mmol/L Final   CO2 02/18/2023  29  22 - 32 mmol/L Final   Glucose, Bld 02/18/2023 292 (H)  70 - 99 mg/dL Final   Glucose reference range applies only to samples taken after fasting for at least 8 hours.   BUN 02/18/2023 29 (H)  6 - 20 mg/dL Final   Creatinine, Ser 02/18/2023 1.66 (H)  0.44 - 1.00 mg/dL Final   Calcium 60/45/4098 7.6 (L)  8.9 - 10.3 mg/dL Final   GFR, Estimated 02/18/2023 36 (L)  >60 mL/min Final   Comment: (NOTE) Calculated using the CKD-EPI Creatinine Equation (2021)    Anion gap 02/18/2023 11  5 - 15 Final   Performed at [facility], [address] [place], [place] [address]   WBC 02/18/2023 7.7  4.0 - 10.5 K/uL Final   RBC 02/18/2023 3.81 (L)  3.87 - 5.11 MIL/uL Final   Hemoglobin 02/18/2023 10.2 (L)  12.0 - 15.0 g/dL Final   HCT 78/29/5621 32.7 (L)  36.0 - 46.0 % Final   MCV 02/18/2023 85.8  80.0 - 100.0 fL Final   MCH 02/18/2023 26.8  26.0 - 34.0 pg Final   MCHC 02/18/2023 31.2  30.0 - 36.0 g/dL Final   RDW 30/86/5784 14.0  11.5 - 15.5 % Final   Platelets 02/18/2023 298  150 - 400 K/uL Final   nRBC 02/18/2023 0.0  0.0 - 0.2 % Final   Performed at [facility], [address], [place], [place] [address]   Troponin I (High Sensitivity) 02/18/2023 27 (H)  <18 ng/L Final   Comment: (NOTE) Elevated high sensitivity troponin I (hsTnI) values and significant  changes across serial measurements may suggest ACS but many other  chronic and acute conditions are known to elevate hsTnI results.  Refer to the "Links" section for chest pain algorithms and additional  guidance. Performed at [facility], [address], [place], [place] [address]    D-Dimer, Quant 02/18/2023 0.32  0.00 - 0.50 ug/mL-FEU Final   Comment: (NOTE) At the manufacturer cut-off value of 0.5 g/mL FEU, this assay has a negative predictive value of 95-100%.This assay is intended for use in conjunction with a clinical pretest probability (PTP) assessment model to exclude  pulmonary embolism (PE) and deep venous thrombosis (DVT) in outpatients suspected of PE or DVT. Results should be correlated with clinical presentation. Performed at [facility], [address] [place], [place] [address]   Admission on 02/17/2023, Discharged on 02/17/2023  Component Date Value Ref Range Status   WBC 02/17/2023 8.9  4.0 - 10.5 K/uL Final   RBC 02/17/2023 3.71 (L)  3.87 - 5.11 MIL/uL Final   Hemoglobin 02/17/2023 10.3 (L)  12.0 - 15.0 g/dL Final   HCT 09/09/2535 32.1 (L)  36.0 - 46.0 % Final   MCV 02/17/2023 86.5  80.0 - 100.0 fL Final   MCH 02/17/2023 27.8  26.0 - 34.0 pg Final   MCHC 02/17/2023 32.1  30.0 - 36.0 g/dL Final   RDW 64/40/3474 13.9  11.5 - 15.5 % Final   Platelets 02/17/2023 316  150 - 400 K/uL Final   nRBC 02/17/2023 0.0  0.0 - 0.2 % Final  Performed at [facility], [address] [place], [place] [address]   Sodium 02/17/2023 131 (L)  135 - 145 mmol/L Final   Potassium 02/17/2023 3.3 (L)  3.5 - 5.1 mmol/L Final   Chloride 02/17/2023 92 (L)  98 - 111 mmol/L Final   CO2 02/17/2023 28  22 - 32 mmol/L Final   Glucose, Bld 02/17/2023 253 (H)  70 - 99 mg/dL Final   Glucose reference range applies only to samples taken after fasting for at least 8 hours.   BUN 02/17/2023 24 (H)  6 - 20 mg/dL Final   Creatinine, Ser 02/17/2023 2.00 (H)  0.44 - 1.00 mg/dL Final   Calcium 60/45/4098 7.6 (L)  8.9 - 10.3 mg/dL Final   GFR, Estimated 02/17/2023 29 (L)  >60 mL/min Final   Comment: (NOTE) Calculated using the CKD-EPI Creatinine Equation (2021)    Anion gap 02/17/2023 11  5 - 15 Final   Performed at [facility], [address], [place], [place] [address]  Admission on 02/01/2023, Discharged on 02/03/2023  Component Date Value Ref Range Status   Sodium 02/01/2023 134 (L)  135 - 145 mmol/L Final   Potassium 02/01/2023 3.7  3.5 - 5.1 mmol/L Final   Chloride 02/01/2023 100  98 - 111 mmol/L Final   CO2 02/01/2023 22  22 - 32  mmol/L Final   Glucose, Bld 02/01/2023 302 (H)  70 - 99 mg/dL Final   Glucose reference range applies only to samples taken after fasting for at least 8 hours.   BUN 02/01/2023 13  6 - 20 mg/dL Final   Creatinine, Ser 02/01/2023 1.65 (H)  0.44 - 1.00 mg/dL Final   Calcium 78/29/5621 8.4 (L)  8.9 - 10.3 mg/dL Final   GFR, Estimated 02/01/2023 36 (L)  >60 mL/min Final   Comment: (NOTE) Calculated using the CKD-EPI Creatinine Equation (2021)    Anion gap 02/01/2023 12  5 - 15 Final   Performed at [facility], [address] [place], [place] [address]   WBC 02/01/2023 7.0  4.0 - 10.5 K/uL Final   RBC 02/01/2023 3.79 (L)  3.87 - 5.11 MIL/uL Final   Hemoglobin 02/01/2023 10.2 (L)  12.0 - 15.0 g/dL Final   HCT 78/46/9629 32.6 (L)  36.0 - 46.0 % Final   MCV 02/01/2023 86.0  80.0 - 100.0 fL Final   MCH 02/01/2023 26.9  26.0 - 34.0 pg Final   MCHC 02/01/2023 31.3  30.0 - 36.0 g/dL Final   RDW 52/84/1324 14.4  11.5 - 15.5 % Final   Platelets 02/01/2023 314  150 - 400 K/uL Final   nRBC 02/01/2023 0.0  0.0 - 0.2 % Final   Performed at [facility], [address], [place], [place] [address]   Troponin I (High Sensitivity) 02/01/2023 10  <18 ng/L Final   Comment: (NOTE) Elevated high sensitivity troponin I (hsTnI) values and significant  changes across serial measurements may suggest ACS but many other  chronic and acute conditions are known to elevate hsTnI results.  Refer to the "Links" section for chest pain algorithms and additional  guidance. Performed at [facility], [address], [place], [place] [address]    I-stat hCG, quantitative 02/01/2023 <5.0  <5 mIU/mL Final   Comment 3 02/01/2023          Final   Comment:   GEST. AGE      CONC.  (mIU/mL)   <=1 WEEK  5 - 50     2 WEEKS       50 - 500     3 WEEKS       100 - 10,000     4 WEEKS     1,000 - 30,000        FEMALE AND NON-PREGNANT FEMALE:     LESS THAN 5 mIU/mL    Troponin I (High Sensitivity)  02/01/2023 11  <18 ng/L Final   Comment: (NOTE) Elevated high sensitivity troponin I (hsTnI) values and significant  changes across serial measurements may suggest ACS but many other  chronic and acute conditions are known to elevate hsTnI results.  Refer to the "Links" section for chest pain algorithms and additional  guidance. Performed at [facility], [address], [place], [place] [address]    Hgb A1c MFr Bld 02/02/2023 7.6 (H)  4.8 - 5.6 % Final   Comment: (NOTE)         Prediabetes: 5.7 - 6.4         Diabetes: >6.4         Glycemic control for adults with diabetes: <7.0    Mean Plasma Glucose 02/02/2023 171  mg/dL Final   Comment: (NOTE) Performed At: [facility] [address] [place], [place] [id number] Jolene Schimke MD OZ:[id number]    Opiates 02/02/2023 NONE DETECTED  NONE DETECTED Final   Cocaine 02/02/2023 POSITIVE (A)  NONE DETECTED Final   Benzodiazepines 02/02/2023 NONE DETECTED  NONE DETECTED Final   Amphetamines 02/02/2023 NONE DETECTED  NONE DETECTED Final   Tetrahydrocannabinol 02/02/2023 NONE DETECTED  NONE DETECTED Final   Barbiturates 02/02/2023 NONE DETECTED  NONE DETECTED Final   Comment: (NOTE) DRUG SCREEN FOR MEDICAL PURPOSES ONLY.  IF CONFIRMATION IS NEEDED FOR ANY PURPOSE, NOTIFY LAB WITHIN 5 DAYS.  LOWEST DETECTABLE LIMITS FOR URINE DRUG SCREEN Drug Class                     Cutoff (ng/mL) Amphetamine and metabolites    1000 Barbiturate and metabolites    200 Benzodiazepine                 200 Opiates and metabolites        300 Cocaine and metabolites        300 THC                            50 Performed at [facility], [address] [place], [place] [address]    Glucose-Capillary 02/02/2023 290 (H)  70 - 99 mg/dL Final   Glucose reference range applies only to samples taken after fasting for at least 8 hours.   Comment 1 02/02/2023 Notify RN   Final   Comment 2 02/02/2023 Document in Chart   Final   WBC  02/02/2023 7.6  4.0 - 10.5 K/uL Final   RBC 02/02/2023 3.34 (L)  3.87 - 5.11 MIL/uL Final   Hemoglobin 02/02/2023 9.2 (L)  12.0 - 15.0 g/dL Final   HCT 38/75/6433 28.2 (L)  36.0 - 46.0 % Final   MCV 02/02/2023 84.4  80.0 - 100.0 fL Final   MCH 02/02/2023 27.5  26.0 - 34.0 pg Final   MCHC 02/02/2023 32.6  30.0 - 36.0 g/dL Final   RDW 29/51/8841 14.5  11.5 - 15.5 % Final   Platelets 02/02/2023 263  150 - 400 K/uL Final   nRBC 02/02/2023 0.0  0.0 - 0.2 % Final   Performed at [facility], [address] [place], [place] [address]   Sodium 02/02/2023 137  135 - 145 mmol/L Final   Potassium 02/02/2023 3.4 (L)  3.5 - 5.1 mmol/L Final   Chloride 02/02/2023 104  98 - 111 mmol/L Final   CO2 02/02/2023 22  22 - 32 mmol/L Final   Glucose, Bld 02/02/2023 274 (H)  70 - 99 mg/dL Final   Glucose reference range applies only to samples taken after fasting for at least 8 hours.   BUN 02/02/2023 18  6 - 20 mg/dL Final   Creatinine, Ser 02/02/2023 1.94 (H)  0.44 - 1.00 mg/dL Final   Calcium 60/45/4098 8.1 (L)  8.9 - 10.3 mg/dL Final   Total Protein 11/91/4782 6.3 (L)  6.5 - 8.1 g/dL Final   Albumin 95/62/1308 2.5 (L)  3.5 - 5.0 g/dL Final   AST 65/78/4696 10 (L)  15 - 41 U/L Final   ALT 02/02/2023 13  0 - 44 U/L Final   Alkaline Phosphatase 02/02/2023 91  38 - 126 U/L Final   Total Bilirubin 02/02/2023 0.4  0.3 - 1.2 mg/dL Final   GFR, Estimated 02/02/2023 30 (L)  >60 mL/min Final   Comment: (NOTE) Calculated using the CKD-EPI Creatinine Equation (2021)    Anion gap 02/02/2023 11  5 - 15 Final   Performed at [facility], [address] [place], [place] [address]   Magnesium 02/02/2023 1.2 (L)  1.7 - 2.4 mg/dL Final   Performed at [facility], [address], [place], [place] [address]   Phosphorus 02/02/2023 4.4  2.5 - 4.6 mg/dL Final   Performed at [facility], [address] [place], [place] [address]   MRSA by PCR Next Gen 02/02/2023 NOT DETECTED  NOT DETECTED Final    Comment: (NOTE) The GeneXpert MRSA Assay (FDA approved for NASAL specimens only), is one component of a comprehensive MRSA colonization surveillance program. It is not intended to diagnose MRSA infection nor to guide or monitor treatment for MRSA infections. Test performance is not FDA approved in patients less than 13 years old. Performed at [facility], [address], [place], [place] [address]    Glucose-Capillary 02/02/2023 245 (H)  70 - 99 mg/dL Final   Glucose reference range applies only to samples taken after fasting for at least 8 hours.   Cholesterol 02/02/2023 111  0 - 200 mg/dL Final   Triglycerides 64/40/3474 128  <150 mg/dL Final   HDL 25/95/6387 36 (L)  >40 mg/dL Final   Total CHOL/HDL Ratio 02/02/2023 3.1  RATIO Final   VLDL 02/02/2023 26  0 - 40 mg/dL Final   LDL Cholesterol 02/02/2023 49  0 - 99 mg/dL Final   Comment:        Total Cholesterol/HDL:CHD Risk Coronary Heart Disease Risk Table                     Men   Women  1/2 Average Risk   3.4   3.3  Average Risk       5.0   4.4  2 X Average Risk   9.6   7.1  3 X Average Risk  23.4   11.0        Use the calculated Patient Ratio above and the CHD Risk Table to determine the patient's CHD Risk.        ATP III CLASSIFICATION (  LDL):  <100     mg/dL   Optimal  865-784  mg/dL   Near or Above                    Optimal  130-159  mg/dL   Borderline  696-295  mg/dL   High  >284     mg/dL   Very High Performed at [facility], [address], [place], [place] [address]    Weight 02/02/2023 5,358.06  oz Final   Height 02/02/2023 63  in Final   BP 02/02/2023 138/66  mmHg Final   S' Lateral 02/02/2023 2.90  cm Final   Area-P 1/2 02/02/2023 4.44  cm2 Final   Est EF 02/02/2023 65 - 70%   Final   Glucose-Capillary 02/02/2023 244 (H)  70 - 99 mg/dL Final   Glucose reference range applies only to samples taken after fasting for at least 8 hours.   Glucose-Capillary 02/02/2023 211 (H)  70 - 99 mg/dL  Final   Glucose reference range applies only to samples taken after fasting for at least 8 hours.   Sodium 02/03/2023 138  135 - 145 mmol/L Final   Potassium 02/03/2023 3.6  3.5 - 5.1 mmol/L Final   Chloride 02/03/2023 108  98 - 111 mmol/L Final   CO2 02/03/2023 23  22 - 32 mmol/L Final   Glucose, Bld 02/03/2023 111 (H)  70 - 99 mg/dL Final   Glucose reference range applies only to samples taken after fasting for at least 8 hours.   BUN 02/03/2023 23 (H)  6 - 20 mg/dL Final   Creatinine, Ser 02/03/2023 1.69 (H)  0.44 - 1.00 mg/dL Final   Calcium 11/15/7251 7.7 (L)  8.9 - 10.3 mg/dL Final   GFR, Estimated 02/03/2023 35 (L)  >60 mL/min Final   Comment: (NOTE) Calculated using the CKD-EPI Creatinine Equation (2021)    Anion gap 02/03/2023 7  5 - 15 Final   Performed at [facility], [address], [place], [place] [address]   Magnesium 02/03/2023 1.8  1.7 - 2.4 mg/dL Final   Performed at [facility], [address], [place], [place] [address]   Lipoprotein (a) 02/03/2023 127.0 (H)  <75.0 nmol/L Final   Comment: (NOTE) Note:  Values greater than or equal to 75.0 nmol/L may       indicate an independent risk factor for CHD,       but must be evaluated with caution when applied       to non-Caucasian populations due to the       influence of genetic factors on Lp(a) across       ethnicities. Performed At: [facility] [address] [place], [place] [id number] Jolene Schimke MD [id number]    Glucose-Capillary 02/02/2023 256 (H)  70 - 99 mg/dL Final   Glucose reference range applies only to samples taken after fasting for at least 8 hours.   Glucose-Capillary 02/03/2023 114 (H)  70 - 99 mg/dL Final   Glucose reference range applies only to samples taken after fasting for at least 8 hours.   Comment 1 02/03/2023 Notify RN   Final   Comment 2 02/03/2023 Document in Chart   Final   Glucose-Capillary 02/03/2023 144 (H)  70 - 99 mg/dL Final   Glucose reference range  applies only to samples taken after fasting for at least 8 hours.   Comment 1 02/03/2023 Notify RN   Final   Comment  2 02/03/2023 Document in Chart   Final   Glucose-Capillary 02/03/2023 185 (H)  70 - 99 mg/dL Final   Glucose reference range applies only to samples taken after fasting for at least 8 hours.   Glucose-Capillary 02/03/2023 216 (H)  70 - 99 mg/dL Final   Glucose reference range applies only to samples taken after fasting for at least 8 hours.    Allergies: Aspirin  Medications:  Facility Ordered Medications  Medication   acetaminophen (TYLENOL) tablet 650 mg   alum & mag hydroxide-simeth (MAALOX/MYLANTA) 200-200-20 MG/5ML suspension 30 mL   magnesium hydroxide (MILK OF MAGNESIA) suspension 30 mL   nitroGLYCERIN (NITROSTAT) SL tablet 0.4 mg   albuterol (VENTOLIN HFA) 108 (90 Base) MCG/ACT inhaler 2 puff   montelukast (SINGULAIR) tablet 10 mg   mometasone-formoterol (DULERA) 200-5 MCG/ACT inhaler 2 puff   pregabalin (LYRICA) capsule 100 mg   FLUoxetine (PROZAC) capsule 40 mg   metFORMIN (GLUCOPHAGE) tablet 1,000 mg   losartan (COZAAR) tablet 100 mg   atorvastatin (LIPITOR) tablet 80 mg   [START ON 07/31/2023] Exenatide ER AUIJ 2 mg   torsemide (DEMADEX) tablet 40 mg   hydrochlorothiazide (HYDRODIURIL) tablet 25 mg   potassium chloride (KLOR-CON M) CR tablet 10 mEq   naphazoline-glycerin (CLEAR EYES REDNESS) ophth solution 2 drop   thiamine (VITAMIN B1) tablet 100 mg   insulin glargine-yfgn (SEMGLEE) injection 28 Units   fluticasone (FLONASE) 50 MCG/ACT nasal spray 1 spray   amLODipine (NORVASC) tablet 10 mg   ARIPiprazole (ABILIFY) tablet 5 mg   insulin aspart (novoLOG) injection 0-20 Units   PTA Medications  Medication Sig   amLODipine (NORVASC) 10 MG tablet Take 10 mg by mouth daily.   pregabalin (LYRICA) 100 MG capsule Take 100 mg by mouth 2 (two) times daily.   aspirin EC 81 MG tablet Take 81 mg by mouth daily. Swallow whole.   atorvastatin (LIPITOR) 80 MG  tablet Take 80 mg by mouth daily.   SYMBICORT 160-4.5 MCG/ACT inhaler Inhale 2 puffs into the lungs 2 (two) times daily.   BYDUREON BCISE 2 MG/0.85ML AUIJ Inject 2 mg into the skin every Tuesday.   FLUoxetine (PROZAC) 40 MG capsule Take 40 mg by mouth daily.   fluticasone (FLONASE) 50 MCG/ACT nasal spray Place 1 spray into both nostrils daily as needed for allergies or rhinitis.   LANTUS SOLOSTAR 100 UNIT/ML Solostar Pen Inject 28 Units into the skin at bedtime.   losartan (COZAAR) 100 MG tablet Take 100 mg by mouth daily.   montelukast (SINGULAIR) 10 MG tablet Take 10 mg by mouth at bedtime.   potassium chloride (KLOR-CON M) 10 MEQ tablet Take 10 mEq by mouth daily.   thiamine (VITAMIN B1) 100 MG tablet Take 100 mg by mouth 2 (two) times daily.   torsemide (DEMADEX) 20 MG tablet Take 2 tablets (40 mg total) by mouth 2 (two) times daily. TAKE 2 TABLETS (40mg ) BY MOUTH TWICE DAILY (Patient taking differently: Take 40 mg by mouth 2 (two) times daily.)   hydrochlorothiazide (HYDRODIURIL) 25 MG tablet Take 25 mg by mouth daily.   ARIPiprazole (ABILIFY) 5 MG tablet Take 5 mg by mouth daily.   carvedilol (COREG) 6.25 MG tablet Take 6.25 mg by mouth 2 (two) times daily.   metFORMIN (GLUCOPHAGE) 1000 MG tablet Take 1,000 mg by mouth 2 (two) times daily.   albuterol (VENTOLIN HFA) 108 (90 Base) MCG/ACT inhaler Inhale 2 puffs into the lungs every 4 (four) hours as needed for wheezing or shortness of  breath.   diphenhydramine-acetaminophen (TYLENOL PM) 25-500 MG TABS tablet Take 1 tablet by mouth at bedtime as needed (For sleep or pain).   nitroGLYCERIN (NITROSTAT) 0.4 MG SL tablet Place 0.4 mg under the tongue every 5 (five) minutes as needed for chest pain.      Medical Decision Making  Raqueal Marietta was admitted to [facility] continuous assessment unit  for MDD (major depressive disorder), recurrent, severe, with psychosis (HCC), crisis management, and stabilization  while awaiting appropriate bed for inpatient psychiatric treatment. Routine labs ordered, which include Lab Orders         CBC with Differential/Platelet         Comprehensive metabolic panel         Hemoglobin A1c         Magnesium         Ethanol         Lipid panel         TSH         Prolactin         Urinalysis, Routine w reflex microscopic -Urine, Clean Catch         POC urine preg, ED         POCT Urine Drug Screen - (I-Screen)    Medication Management: Medications started Meds ordered this encounter  Medications   acetaminophen (TYLENOL) tablet 650 mg   alum & mag hydroxide-simeth (MAALOX/MYLANTA) 200-200-20 MG/5ML suspension 30 mL   magnesium hydroxide (MILK OF MAGNESIA) suspension 30 mL   nitroGLYCERIN (NITROSTAT) SL tablet 0.4 mg   albuterol (VENTOLIN HFA) 108 (90 Base) MCG/ACT inhaler 2 puff   montelukast (SINGULAIR) tablet 10 mg   mometasone-formoterol (DULERA) 200-5 MCG/ACT inhaler 2 puff   pregabalin (LYRICA) capsule 100 mg   FLUoxetine (PROZAC) capsule 40 mg   metFORMIN (GLUCOPHAGE) tablet 1,000 mg   losartan (COZAAR) tablet 100 mg   atorvastatin (LIPITOR) tablet 80 mg   Exenatide ER AUIJ 2 mg   torsemide (DEMADEX) tablet 40 mg   hydrochlorothiazide (HYDRODIURIL) tablet 25 mg   potassium chloride (KLOR-CON M) CR tablet 10 mEq   naphazoline-glycerin (CLEAR EYES REDNESS) ophth solution 2 drop   thiamine (VITAMIN B1) tablet 100 mg   insulin glargine-yfgn (SEMGLEE) injection 28 Units   fluticasone (FLONASE) 50 MCG/ACT nasal spray 1 spray   amLODipine (NORVASC) tablet 10 mg   ARIPiprazole (ABILIFY) tablet 5 mg   insulin aspart (novoLOG) injection 0-20 Units    Order Specific Question:   Correction coverage:    Answer:   Resistant (obese, steroids)    Order Specific Question:   CBG < 70:    Answer:   Implement Hypoglycemia Standing Orders and refer to Hypoglycemia Standing Orders sidebar report    Order Specific Question:   CBG 70 - 120:    Answer:   0 units     Order Specific Question:   CBG 121 - 150:    Answer:   3 units    Order Specific Question:   CBG 151 - 200:    Answer:   4 units    Order Specific Question:   CBG 201 - 250:    Answer:   7 units    Order Specific Question:   CBG 251 - 300:    Answer:   11 units    Order Specific Question:   CBG 301 - 350:    Answer:   15 units    Order Specific Question:   CBG 351 -  400:    Answer:   20 units    Order Specific Question:   CBG > 400    Answer:   call MD and obtain STAT lab verification    Will maintain continuous observation for safety. Social work will consult patient related to psychiatric hospitalization and discharge follow up plan.    Recommendations  Based on my evaluation the patient does not appear to have an emergency medical condition. Inpatient psychiatric treatment recommended  Kannon Granderson, NP 07/30/23  12:19 PM

## 2023-07-30 NOTE — ED Notes (Signed)
Patient has a wound on the back of her right leg under her buttocks, it is red and some skin is missing. Patient reports it has been there for a while and states she has continous pain in her right leg. Provider made aware.

## 2023-07-30 NOTE — ED Notes (Signed)
Attempted x2 to obtain UDS, patient did not urinate enough to obtain a sample. Pt transported to [facility] via safe transport, report called to [employer]
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
## 2023-07-30 NOTE — Progress Notes (Signed)
Brief Pharmacy Note:  SCr 1.92 and GFR = 30. Metformin contraindicated with GFR < 30. For GFR 30-45, maximum recommended dose is 500mg  PO BID. Patient currently ordered Metformin 1000mg  PO BID. Discussed plan with Beverly Milch, NP - per NP, hold metformin for now.    Greer Pickerel, PharmD, BCPS Clinical Pharmacist 07/30/2023 9:39 PM

## 2023-07-30 NOTE — BHH Group Notes (Signed)
BHH Group Notes:  (Nursing/MHT/Case Management/Adjunct)  Date:  07/30/2023  Time:  10:51 PM  Type of Therapy:  Group Therapy  Participation Level:  Did Not Attend  Participation Quality:   n/a  Affect:   n/a  Cognitive:   n/a  Insight:  None  Engagement in Group:   n/a  Modes of Intervention:   n/a  Summary of Progress/Problems:  Luis Sami E Jilda Kress 07/30/2023, 10:51 PM

## 2023-07-31 DIAGNOSIS — F411 Generalized anxiety disorder: Secondary | ICD-10-CM | POA: Insufficient documentation

## 2023-07-31 LAB — GLUCOSE, CAPILLARY
Glucose-Capillary: 143 mg/dL — ABNORMAL HIGH (ref 70–99)
Glucose-Capillary: 207 mg/dL — ABNORMAL HIGH (ref 70–99)
Glucose-Capillary: 192 mg/dL — ABNORMAL HIGH (ref 70–99)
Glucose-Capillary: 199 mg/dL — ABNORMAL HIGH (ref 70–99)

## 2023-07-31 LAB — PROLACTIN: Prolactin: 10.4 ng/mL (ref 3.6–25.2)

## 2023-07-31 MED ORDER — MIRTAZAPINE 7.5 MG PO TABS
7.5000 mg | ORAL_TABLET | Freq: Every day | ORAL | Status: DC
  Administered 2023-07-31: 7.5 mg via ORAL
  Filled 2023-07-31 (×2): qty 1

## 2023-07-31 MED ORDER — LOPERAMIDE HCL 2 MG PO CAPS: 2.0000 mg | ORAL_CAPSULE | ORAL | Status: DC | PRN

## 2023-07-31 MED ORDER — DICYCLOMINE HCL 20 MG PO TABS: 20.0000 mg | ORAL_TABLET | Freq: Four times a day (QID) | ORAL | Status: DC | PRN

## 2023-07-31 MED ORDER — HYDROXYZINE HCL 25 MG PO TABS
25.0000 mg | ORAL_TABLET | Freq: Four times a day (QID) | ORAL | Status: DC | PRN
  Administered 2023-07-31 (×2): 25 mg via ORAL
  Filled 2023-07-31 (×2): qty 1

## 2023-07-31 MED ORDER — ONDANSETRON 4 MG PO TBDP: 4.0000 mg | ORAL_TABLET | Freq: Four times a day (QID) | ORAL | Status: DC | PRN

## 2023-07-31 MED ORDER — TORSEMIDE 20 MG PO TABS
20.0000 mg | ORAL_TABLET | Freq: Two times a day (BID) | ORAL | Status: DC
  Administered 2023-07-31 – 2023-08-01 (×2): 20 mg via ORAL
  Filled 2023-07-31 (×4): qty 1

## 2023-07-31 MED ORDER — NAPROXEN 500 MG PO TABS: 500.0000 mg | ORAL_TABLET | Freq: Two times a day (BID) | ORAL | Status: DC | PRN

## 2023-07-31 MED ORDER — VENLAFAXINE HCL ER 37.5 MG PO CP24
37.5000 mg | ORAL_CAPSULE | Freq: Every day | ORAL | Status: DC
  Administered 2023-08-01: 37.5 mg via ORAL
  Filled 2023-07-31 (×2): qty 1

## 2023-07-31 MED ORDER — METHOCARBAMOL 500 MG PO TABS: 500.0000 mg | ORAL_TABLET | Freq: Three times a day (TID) | ORAL | Status: DC | PRN

## 2023-07-31 NOTE — Group Note (Signed)
Date:  07/31/2023 Time:  1:19 AM  Group Topic/Focus:  Wrap-Up Group:   The focus of this group is to help patients review their daily goal of treatment and discuss progress on daily workbooks.    Participation Level:  Did Not Attend  Participation Quality:   N/A  Affect:   N/A  Cognitive:   N/A  Insight: None  Engagement in Group:  None  Modes of Intervention:   N/A  Additional Comments:  Patient did not attend group.   Kennieth Francois 07/31/2023, 1:19 AM

## 2023-07-31 NOTE — Plan of Care (Signed)

## 2023-07-31 NOTE — Group Note (Signed)
Recreation Therapy Group Note   Group Topic:Animal Assisted Therapy   Group Date: 07/31/2023 Start Time: 0950 End Time: 1035 Facilitators: Koula Venier, Benito Mccreedy, LRT   Animal-Assisted Activity (AAA) Program Checklist/Progress Note Patient Eligibility Criteria Checklist & Daily Group note for Rec Tx Intervention   AAA/T Program Assumption of Risk Form signed by Patient/ or Parent Legal Guardian YES  Patient understands their participation is voluntary YES   Group Description: Patients provided opportunity to interact with trained and credentialed [employer] Therapy dog and the community volunteer/dog handler.    Affect/Mood: N/A   Participation Level: Did not attend    Clinical Observations/Individualized Feedback: Pt did not join group session following invitation to open dayroom for AAA programming.   Benito Mccreedy Mareo Portilla, LRT, CTRS 07/31/2023 12:44 PM
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
## 2023-07-31 NOTE — Progress Notes (Signed)
Patient ID: Hailey Klein, female   DOB: 02/28/1966, 57 y.o.   MRN: [id number] Pt instructed for need for urine sample.

## 2023-07-31 NOTE — [facility] Group Notes (Signed)
[facility] Group Notes:  (Nursing/MHT/Case Management/Adjunct)  Date:  07/31/2023  Time:  9:21 AM  Type of Therapy:  Group Topic/ Focus: Goals Group: The focus of this group is to help patients establish daily goals to achieve during treatment and discuss how the patient can incorporate goal setting into their daily lives to aide in recovery.   Participation Level:  Did Not Attend  Summary of Progress/Problems:  Patient did not attend goals group today. Patient was encouraged but refused.   Daneil Dan 07/31/2023, 9:21 AM

## 2023-07-31 NOTE — Progress Notes (Signed)
Patient ID: Hailey Klein, female   DOB: 11-19-1965, 57 y.o.   MRN: [id number] Patient presents with depressed mood, affect congruent. Hailey Klein reports ' I just feel tired, weak and dizzy headed. '' Patient reports she has had poor po intake and '' I haven't drank anything since yesterday. '' Pt does report continued suicidal ideation with passive ideation and no plan. She states '' I'm still hearing voices that tell me to join my dead sister. '' She also reports poor sleep and states '' I need my trazodone back it was hard for me to sleep ''  Pt has not completed self inventory. Pt was encouraged to drink po fluids and additional water given. Above discussed in treatment team this am including MD, NP and SW.  Pt is safe, will con't to monitor.
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
## 2023-07-31 NOTE — BHH Group Notes (Signed)
BHH Group Notes:  (Nursing/MHT/Case Management/Adjunct)  Date:  07/31/2023  Time:  9:28 PM  Type of Therapy:   wrap up group  Participation Level:  Active  Participation Quality:  Appropriate  Affect:  Appropriate  Cognitive:  Appropriate  Insight:  Improving  Engagement in Group:  Developing/Improving  Modes of Intervention:  Discussion  Summary of Progress/Problems: Patient engaged in group. Patients mood was pleasant. No issues to report at this time  Hailey Klein 07/31/2023, 9:28 PM
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
## 2023-07-31 NOTE — Group Note (Signed)
LCSW Group Therapy Note   Group Date: 07/31/2023 Start Time: 1100 End Time: 1200   Type of Therapy and Topic:  Group Therapy: Boundaries  Participation Level:  Active  Description of Group: This group will address the use of boundaries in their personal lives. Patients will explore why boundaries are important, the difference between healthy and unhealthy boundaries, and negative and postive outcomes of different boundaries and will look at how boundaries can be crossed.  Patients will be encouraged to identify current boundaries in their own lives and identify what kind of boundary is being set. Facilitators will guide patients in utilizing problem-solving interventions to address and correct types boundaries being used and to address when no boundary is being used. Understanding and applying boundaries will be explored and addressed for obtaining and maintaining a balanced life. Patients will be encouraged to explore ways to assertively make their boundaries and needs known to significant others in their lives, using other group members and facilitator for role play, support, and feedback.  Therapeutic Goals:  1.  Patient will identify areas in their life where setting clear boundaries could be  used to improve their life.  2.  Patient will identify signs/triggers that a boundary is not being respected. 3.  Patient will identify two ways to set boundaries in order to achieve balance in  their lives: 4.  Patient will demonstrate ability to communicate their needs and set boundaries  through discussion and/or role plays  Summary of Patient Progress:  Hailey Klein was present/active throughout the session and proved open to feedback from CSW and peers. Patient demonstrated Hailey Klein insight into the subject matter, was respectful of peers, and was present throughout the entire session.  Therapeutic Modalities:   Cognitive Behavioral Therapy Solution-Focused Therapy  Ane Payment, LCSW 07/31/2023   1:13 PM

## 2023-07-31 NOTE — Progress Notes (Signed)
   07/30/23 2308  Psych Admission Type (Psych Patients Only)  Admission Status Voluntary  Psychosocial Assessment  Patient Complaints Substance abuse  Eye Contact Fair  Facial Expression Flat  Affect Flat  Speech Soft;Logical/coherent  Interaction Guarded  Motor Activity Slow  Appearance/Hygiene Unremarkable  Behavior Characteristics Appropriate to situation;Cooperative  Mood Depressed;Anxious  Aggressive Behavior  Effect No apparent injury  Thought Process  Coherency WDL  Content WDL  Delusions WDL  Perception Hallucinations  Hallucination Auditory  Judgment Impaired  Confusion WDL  Danger to Self  Current suicidal ideation? Passive  Self-Injurious Behavior No self-injurious ideation or behavior indicators observed or expressed   Agreement Not to Harm Self Yes  Description of Agreement verbal  Danger to Others  Danger to Others None reported or observed     07/30/23 2308  Psych Admission Type (Psych Patients Only)  Admission Status Voluntary  Psychosocial Assessment  Patient Complaints Substance abuse  Eye Contact Fair  Facial Expression Flat  Affect Flat  Speech Soft;Logical/coherent  Interaction Guarded  Motor Activity Slow  Appearance/Hygiene Unremarkable  Behavior Characteristics Appropriate to situation;Cooperative  Mood Depressed;Anxious  Aggressive Behavior  Effect No apparent injury  Thought Process  Coherency WDL  Content WDL  Delusions WDL  Perception Hallucinations  Hallucination Auditory  Judgment Impaired  Confusion WDL  Danger to Self  Current suicidal ideation? Passive  Self-Injurious Behavior No self-injurious ideation or behavior indicators observed or expressed   Agreement Not to Harm Self Yes  Description of Agreement verbal  Danger to Others  Danger to Others None reported or observed

## 2023-08-01 ENCOUNTER — Encounter (HOSPITAL_COMMUNITY): Payer: Self-pay

## 2023-08-01 ENCOUNTER — Inpatient Hospital Stay (HOSPITAL_COMMUNITY): Payer: Self-pay

## 2023-08-01 ENCOUNTER — Encounter (HOSPITAL_COMMUNITY): Payer: Self-pay | Admitting: Registered Nurse

## 2023-08-01 ENCOUNTER — Other Ambulatory Visit: Payer: Self-pay

## 2023-08-01 ENCOUNTER — Inpatient Hospital Stay (HOSPITAL_COMMUNITY)
Admission: EM | Admit: 2023-08-01 | Discharge: 2023-08-03 | DRG: 683 | Disposition: A | Discharge status: Other Acute Inpatient Hospital | Payer: Medicaid Other | Source: Other Acute Inpatient Hospital | Attending: Internal Medicine | Admitting: Internal Medicine

## 2023-08-01 DIAGNOSIS — E860 Dehydration: Secondary | ICD-10-CM | POA: Diagnosis present

## 2023-08-01 DIAGNOSIS — E440 Moderate protein-calorie malnutrition: Secondary | ICD-10-CM | POA: Diagnosis present

## 2023-08-01 DIAGNOSIS — E7800 Pure hypercholesterolemia, unspecified: Secondary | ICD-10-CM | POA: Diagnosis present

## 2023-08-01 DIAGNOSIS — Z79899 Other long term (current) drug therapy: Secondary | ICD-10-CM

## 2023-08-01 DIAGNOSIS — E66813 Obesity, class 3: Secondary | ICD-10-CM | POA: Diagnosis present

## 2023-08-01 DIAGNOSIS — E1142 Type 2 diabetes mellitus with diabetic polyneuropathy: Secondary | ICD-10-CM | POA: Diagnosis present

## 2023-08-01 DIAGNOSIS — E1122 Type 2 diabetes mellitus with diabetic chronic kidney disease: Secondary | ICD-10-CM | POA: Diagnosis present

## 2023-08-01 DIAGNOSIS — D649 Anemia, unspecified: Secondary | ICD-10-CM | POA: Diagnosis present

## 2023-08-01 DIAGNOSIS — Z811 Family history of alcohol abuse and dependence: Secondary | ICD-10-CM

## 2023-08-01 DIAGNOSIS — N1832 Chronic kidney disease, stage 3b: Secondary | ICD-10-CM | POA: Diagnosis present

## 2023-08-01 DIAGNOSIS — Z91199 Patient's noncompliance with other medical treatment and regimen due to unspecified reason: Secondary | ICD-10-CM

## 2023-08-01 DIAGNOSIS — Z6841 Body Mass Index (BMI) 40.0 and over, adult: Secondary | ICD-10-CM

## 2023-08-01 DIAGNOSIS — F339 Major depressive disorder, recurrent, unspecified: Secondary | ICD-10-CM | POA: Diagnosis present

## 2023-08-01 DIAGNOSIS — R45851 Suicidal ideations: Secondary | ICD-10-CM

## 2023-08-01 DIAGNOSIS — E1169 Type 2 diabetes mellitus with other specified complication: Secondary | ICD-10-CM | POA: Diagnosis present

## 2023-08-01 DIAGNOSIS — N179 Acute kidney failure, unspecified: Secondary | ICD-10-CM | POA: Diagnosis not present

## 2023-08-01 DIAGNOSIS — Z23 Encounter for immunization: Secondary | ICD-10-CM

## 2023-08-01 DIAGNOSIS — F333 Major depressive disorder, recurrent, severe with psychotic symptoms: Principal | ICD-10-CM

## 2023-08-01 DIAGNOSIS — E669 Obesity, unspecified: Secondary | ICD-10-CM

## 2023-08-01 DIAGNOSIS — Z720 Tobacco use: Secondary | ICD-10-CM | POA: Diagnosis present

## 2023-08-01 DIAGNOSIS — Z7951 Long term (current) use of inhaled steroids: Secondary | ICD-10-CM

## 2023-08-01 DIAGNOSIS — Z7982 Long term (current) use of aspirin: Secondary | ICD-10-CM

## 2023-08-01 DIAGNOSIS — J984 Other disorders of lung: Secondary | ICD-10-CM

## 2023-08-01 DIAGNOSIS — G43909 Migraine, unspecified, not intractable, without status migrainosus: Secondary | ICD-10-CM | POA: Insufficient documentation

## 2023-08-01 DIAGNOSIS — I129 Hypertensive chronic kidney disease with stage 1 through stage 4 chronic kidney disease, or unspecified chronic kidney disease: Secondary | ICD-10-CM | POA: Diagnosis present

## 2023-08-01 DIAGNOSIS — F17210 Nicotine dependence, cigarettes, uncomplicated: Secondary | ICD-10-CM | POA: Diagnosis present

## 2023-08-01 DIAGNOSIS — Z886 Allergy status to analgesic agent status: Secondary | ICD-10-CM

## 2023-08-01 DIAGNOSIS — I10 Essential (primary) hypertension: Secondary | ICD-10-CM | POA: Diagnosis present

## 2023-08-01 DIAGNOSIS — D631 Anemia in chronic kidney disease: Secondary | ICD-10-CM | POA: Diagnosis present

## 2023-08-01 DIAGNOSIS — R1013 Epigastric pain: Secondary | ICD-10-CM | POA: Diagnosis present

## 2023-08-01 DIAGNOSIS — Z7984 Long term (current) use of oral hypoglycemic drugs: Secondary | ICD-10-CM

## 2023-08-01 DIAGNOSIS — Z794 Long term (current) use of insulin: Secondary | ICD-10-CM

## 2023-08-01 DIAGNOSIS — Z9151 Personal history of suicidal behavior: Secondary | ICD-10-CM

## 2023-08-01 DIAGNOSIS — J449 Chronic obstructive pulmonary disease, unspecified: Secondary | ICD-10-CM | POA: Diagnosis present

## 2023-08-01 DIAGNOSIS — F4310 Post-traumatic stress disorder, unspecified: Secondary | ICD-10-CM | POA: Diagnosis present

## 2023-08-01 LAB — CBC WITH DIFFERENTIAL/PLATELET
Abs Immature Granulocytes: 0.03 10*3/uL (ref 0.00–0.07)
Basophils Absolute: 0 10*3/uL (ref 0.0–0.1)
Basophils Relative: 1 %
Eosinophils Absolute: 0.3 10*3/uL (ref 0.0–0.5)
Eosinophils Relative: 3 %
HCT: 28 % — ABNORMAL LOW (ref 36.0–46.0)
Hemoglobin: 8.9 g/dL — ABNORMAL LOW (ref 12.0–15.0)
Immature Granulocytes: 0 %
Lymphocytes Relative: 29 %
Lymphs Abs: 2.3 10*3/uL (ref 0.7–4.0)
MCH: 28.9 pg (ref 26.0–34.0)
MCHC: 31.8 g/dL (ref 30.0–36.0)
MCV: 90.9 fL (ref 80.0–100.0)
Monocytes Absolute: 0.5 10*3/uL (ref 0.1–1.0)
Monocytes Relative: 6 %
Neutro Abs: 4.9 10*3/uL (ref 1.7–7.7)
Neutrophils Relative %: 61 %
Platelets: 279 10*3/uL (ref 150–400)
RBC: 3.08 MIL/uL — ABNORMAL LOW (ref 3.87–5.11)
RDW: 12.9 % (ref 11.5–15.5)
WBC: 8 10*3/uL (ref 4.0–10.5)
nRBC: 0 % (ref 0.0–0.2)

## 2023-08-01 LAB — COMPREHENSIVE METABOLIC PANEL WITH GFR
ALT: 12 U/L (ref 0–44)
AST: 10 U/L — ABNORMAL LOW (ref 15–41)
Albumin: 2.9 g/dL — ABNORMAL LOW (ref 3.5–5.0)
Alkaline Phosphatase: 85 U/L (ref 38–126)
Anion gap: 11 (ref 5–15)
BUN: 45 mg/dL — ABNORMAL HIGH (ref 6–20)
CO2: 22 mmol/L (ref 22–32)
Calcium: 8.3 mg/dL — ABNORMAL LOW (ref 8.9–10.3)
Chloride: 103 mmol/L (ref 98–111)
Creatinine, Ser: 3.05 mg/dL — ABNORMAL HIGH (ref 0.44–1.00)
GFR, Estimated: 17 mL/min — ABNORMAL LOW (ref >60)
Glucose, Bld: 197 mg/dL — ABNORMAL HIGH (ref 70–99)
Potassium: 4.2 mmol/L (ref 3.5–5.1)
Sodium: 136 mmol/L (ref 135–145)
Total Bilirubin: 0.6 mg/dL (ref 0.3–1.2)
Total Protein: 6.8 g/dL (ref 6.5–8.1)

## 2023-08-01 LAB — GLUCOSE, CAPILLARY
Glucose-Capillary: 147 mg/dL — ABNORMAL HIGH (ref 70–99)
Glucose-Capillary: 191 mg/dL — ABNORMAL HIGH (ref 70–99)
Glucose-Capillary: 223 mg/dL — ABNORMAL HIGH (ref 70–99)

## 2023-08-01 MED ORDER — SODIUM CHLORIDE 0.9 % IV BOLUS
1000.0000 mL | Freq: Once | INTRAVENOUS | Status: AC
  Administered 2023-08-01: 1000 mL via INTRAVENOUS

## 2023-08-01 MED ORDER — MONTELUKAST SODIUM 10 MG PO TABS
10.0000 mg | ORAL_TABLET | Freq: Every day | ORAL | Status: DC
  Administered 2023-08-01 – 2023-08-02 (×2): 10 mg via ORAL
  Filled 2023-08-01 (×3): qty 1

## 2023-08-01 MED ORDER — FLUOXETINE HCL 20 MG PO CAPS
40.0000 mg | ORAL_CAPSULE | Freq: Every day | ORAL | Status: DC
  Administered 2023-08-01 – 2023-08-03 (×3): 40 mg via ORAL
  Filled 2023-08-01 (×3): qty 2

## 2023-08-01 MED ORDER — INSULIN ASPART 100 UNIT/ML IJ SOLN
0.0000 [IU] | Freq: Three times a day (TID) | INTRAMUSCULAR | Status: DC
  Administered 2023-08-02 (×2): 3 [IU] via SUBCUTANEOUS
  Administered 2023-08-02: 2 [IU] via SUBCUTANEOUS
  Administered 2023-08-03 (×2): 3 [IU] via SUBCUTANEOUS
  Filled 2023-08-01: qty 0.15

## 2023-08-01 MED ORDER — PREGABALIN 50 MG PO CAPS
100.0000 mg | ORAL_CAPSULE | Freq: Two times a day (BID) | ORAL | Status: DC
  Administered 2023-08-01 – 2023-08-03 (×4): 100 mg via ORAL
  Filled 2023-08-01 (×4): qty 2

## 2023-08-01 MED ORDER — ACETAMINOPHEN 325 MG PO TABS
650.0000 mg | ORAL_TABLET | Freq: Four times a day (QID) | ORAL | Status: DC | PRN
  Administered 2023-08-01 – 2023-08-02 (×3): 650 mg via ORAL
  Filled 2023-08-01 (×3): qty 2

## 2023-08-01 MED ORDER — ATORVASTATIN CALCIUM 40 MG PO TABS
80.0000 mg | ORAL_TABLET | Freq: Every day | ORAL | Status: DC
  Administered 2023-08-02 – 2023-08-03 (×2): 80 mg via ORAL
  Filled 2023-08-01 (×2): qty 2

## 2023-08-01 MED ORDER — ACETAMINOPHEN 650 MG RE SUPP: 650.0000 mg | Freq: Four times a day (QID) | RECTAL | Status: DC | PRN

## 2023-08-01 MED ORDER — ACETAMINOPHEN 325 MG PO TABS
650.0000 mg | ORAL_TABLET | Freq: Once | ORAL | Status: AC
  Administered 2023-08-01: 650 mg via ORAL
  Filled 2023-08-01: qty 2

## 2023-08-01 MED ORDER — MOMETASONE FURO-FORMOTEROL FUM 200-5 MCG/ACT IN AERO
2.0000 | INHALATION_SPRAY | Freq: Two times a day (BID) | RESPIRATORY_TRACT | Status: DC
  Administered 2023-08-01 – 2023-08-03 (×4): 2 via RESPIRATORY_TRACT
  Filled 2023-08-01: qty 8.8

## 2023-08-01 MED ORDER — SODIUM CHLORIDE 0.9 % IV BOLUS
1000.0000 mL | Freq: Once | INTRAVENOUS | Status: DC
Start: 2023-08-01 — End: 2023-08-01

## 2023-08-01 MED ORDER — HEPARIN SODIUM (PORCINE) 5000 UNIT/ML IJ SOLN
5000.0000 [IU] | Freq: Three times a day (TID) | INTRAMUSCULAR | Status: DC
  Administered 2023-08-01 – 2023-08-02 (×2): 5000 [IU] via SUBCUTANEOUS
  Filled 2023-08-01 (×2): qty 1

## 2023-08-01 MED ORDER — ARIPIPRAZOLE 10 MG PO TABS
5.0000 mg | ORAL_TABLET | Freq: Every day | ORAL | Status: DC
  Administered 2023-08-02 – 2023-08-03 (×2): 5 mg via ORAL
  Filled 2023-08-01 (×2): qty 1

## 2023-08-01 MED ORDER — AMLODIPINE BESYLATE 10 MG PO TABS
10.0000 mg | ORAL_TABLET | Freq: Every day | ORAL | Status: DC
  Administered 2023-08-02 – 2023-08-03 (×2): 10 mg via ORAL
  Filled 2023-08-01 (×2): qty 1

## 2023-08-01 MED ORDER — SODIUM CHLORIDE 0.9 % IV BOLUS
500.0000 mL | Freq: Once | INTRAVENOUS | Status: AC
  Administered 2023-08-01: 500 mL via INTRAVENOUS

## 2023-08-01 MED ORDER — ONDANSETRON HCL 4 MG/2ML IJ SOLN: 4.0000 mg | Freq: Four times a day (QID) | INTRAMUSCULAR | Status: DC | PRN

## 2023-08-01 MED ORDER — ASPIRIN 81 MG PO TBEC
81.0000 mg | DELAYED_RELEASE_TABLET | Freq: Every day | ORAL | Status: DC
  Administered 2023-08-02 – 2023-08-03 (×2): 81 mg via ORAL
  Filled 2023-08-01 (×2): qty 1

## 2023-08-01 MED ORDER — ALUM & MAG HYDROXIDE-SIMETH 200-200-20 MG/5ML PO SUSP: 30.0000 mL | ORAL | Status: DC | PRN

## 2023-08-01 MED ORDER — PANTOPRAZOLE SODIUM 40 MG PO TBEC
40.0000 mg | DELAYED_RELEASE_TABLET | Freq: Every day | ORAL | Status: DC
  Administered 2023-08-01 – 2023-08-03 (×3): 40 mg via ORAL
  Filled 2023-08-01 (×3): qty 1

## 2023-08-01 MED ORDER — INSULIN GLARGINE-YFGN 100 UNIT/ML ~~LOC~~ SOLN
28.0000 [IU] | Freq: Every day | SUBCUTANEOUS | Status: DC
  Administered 2023-08-01 – 2023-08-02 (×2): 28 [IU] via SUBCUTANEOUS
  Filled 2023-08-01 (×3): qty 0.28

## 2023-08-01 MED ORDER — ONDANSETRON HCL 4 MG PO TABS: 4.0000 mg | ORAL_TABLET | Freq: Four times a day (QID) | ORAL | Status: DC | PRN

## 2023-08-01 MED ORDER — SODIUM CHLORIDE 0.9 % IV BOLUS: 1000.0000 mL | INTRAVENOUS | Status: DC | PRN

## 2023-08-01 MED ORDER — SODIUM CHLORIDE 0.9 % IV SOLN: INTRAVENOUS | Status: DC

## 2023-08-01 NOTE — Inpatient Diabetes Management (Signed)
Inpatient Diabetes Program Recommendations  AACE/ADA: New Consensus Statement on Inpatient Glycemic Control (2015)  Target Ranges:  Prepandial:   less than 140 mg/dL      Peak postprandial:   less than 180 mg/dL (1-2 hours)      Critically ill patients:  140 - 180 mg/dL   Lab Results  Component Value Date   GLUCAP 191 (H) 08/01/2023   HGBA1C 11.0 (H) 07/30/2023    Review of Glycemic Control  Diabetes history: DM2 Outpatient Diabetes medications: Lantus 28 units at bedtime, metformin 1000 mg BID, Bydurion 2 mg weekly Current orders for Inpatient glycemic control: Semglee 28 units at bedtime  HgbA1C - 11%  Inpatient Diabetes Program Recommendations:    Consider adding Novolog 0-6 TID with meals and 0-5 HS  Will need to f/u with PCP for diabetes management after discharge.  Continue to follow glucose trends.  Thank you. Ailene Ards, RD, LDN, CDCES Inpatient Diabetes Coordinator [phone]

## 2023-08-01 NOTE — Discharge Summary (Cosign Needed Addendum)
Physician Discharge Summary Note  Patient:  Hailey Klein is an 57 y.o., female MRN:  [id number] DOB:  1966/04/24 Patient phone:  [phone] (home)  Patient address:   [address] [place] [address],  Total Time spent with patient: 30 minutes  Date of Admission:  07/30/2023 Date of Discharge: 08/01/2023  Reason for Admission:   Hailey Klein is a 57 yr old female who presented on 9/16 to [facility] with worsening depression and SI in the setting of AH, she was admitted to [facility] on 9/17.  PPHx is significant for Depression, Anxiety, PTSD, and Substance Abuse (Cocaine, Hx of EtOH), and 4 Suicide Attempts (last- [facility] 2016) and 3 Prior Psychiatric Hospitalizations (last- [facility] 11/2015), and no history of Self Injurious Behavior.    When asked what brought her to the hospital she reports she had worsening depression and SI with a plan due to auditory hallucinations.  She reports that it started a couple days ago.  She reports that it is her younger sister died last year and she is telling her to "come join [place]."  She reports another stressor that has been weighing on her is in April she had to move into a motel.    Principal Problem: MDD (major depressive disorder), recurrent, severe, with psychosis (HCC) Discharge Diagnoses: Principal Problem:   MDD (major depressive disorder), recurrent, severe, with psychosis (HCC) Active Problems:   PTSD (post-traumatic stress disorder)   Cocaine abuse (HCC)   GAD (generalized anxiety disorder)   Past Psychiatric History:  Depression, Anxiety, PTSD, and Substance Abuse (Cocaine, Hx of EtOH), and 4 Suicide Attempts (last- [facility] 2016) and 3 Prior Psychiatric Hospitalizations (last- [facility] 11/2015), and no history of Self Injurious Behavior.   Past Medical History:  Past Medical History:  Diagnosis Date   Diabetes mellitus without complication (HCC)    H/O suicide attempt    cut wrists - 19-y-o   High cholesterol    Hypertension     Neuropathy    PTSD (post-traumatic stress disorder)     Past Surgical History:  Procedure Laterality Date   LEFT HEART CATH AND CORONARY ANGIOGRAPHY N/A 02/02/2023   Procedure: LEFT HEART CATH AND CORONARY ANGIOGRAPHY;  Surgeon: Tonny Bollman, MD;  Location: [facility] INVASIVE CV LAB;  Service: Cardiovascular;  Laterality: N/A;   Family History:  Family History  Problem Relation Age of Onset   Alcoholism Other    Family Psychiatric  History: No Known Diagnosis', Substance Abuse, or Suicides  Social History:  Social History   Substance and Sexual Activity  Alcohol Use Not Currently     Social History   Substance and Sexual Activity  Drug Use Yes   Types: Marijuana, Cocaine    Social History   Socioeconomic History   Marital status: Single    Spouse name: Not on file   Number of children: 0   Years of education: Not on file   Highest education level: High school graduate  Occupational History   Occupation: Disabilty  Tobacco Use   Smoking status: Every Day    Types: Cigarettes   Smokeless tobacco: Not on file  Vaping Use   Vaping status: Never Used  Substance and Sexual Activity   Alcohol use: Not Currently   Drug use: Yes    Types: Marijuana, Cocaine   Sexual activity: Yes  Other Topics Concern   Not on file  Social History Narrative   Not on file   Social Determinants of Corporate investment banker  Strain: Medium Risk (07/11/2022)   Overall Financial Resource Strain (CARDIA)    Difficulty of Paying Living Expenses: Somewhat hard  Food Insecurity: Food Insecurity Present (07/30/2023)   Hunger Vital Sign    Worried About Running Out of Food in the Last Year: Often true    Ran Out of Food in the Last Year: Often true  Transportation Needs: Unmet Transportation Needs (07/30/2023)   PRAPARE - Administrator, Civil Service (Medical): Yes    Lack of Transportation (Non-Medical): No  Physical Activity: Not on file  Stress: Not on file  Social Connections:  Not on file    Hospital Course:   During the patient's hospitalization, patient had extensive initial psychiatric evaluation, and follow-up psychiatric evaluations every day.  Psychiatric diagnoses provided upon initial assessment: MDD, Recurrent, Severe, w/ Psychosis, GAD, PTSD, and Cocaine Abuse.  Patient's psychiatric medications were adjusted on admission: Her Prozac was stopped.  She was started on Effexor XR and Remeron.  She was continued on her home Abilify dose.  During the hospitalization, other adjustments were made to the patient's psychiatric medication regimen: None as she needed to be admitted to [facility].  Patient's care was discussed during the interdisciplinary team meeting every day during the hospitalization.  The patient is not having side effects to prescribed psychiatric medication.   Labs were reviewed with the patient, and abnormal results were discussed with the patient.  The patient is able to verbalize their individual safety plan to this provider.  # It is recommended to the patient to continue psychiatric medications as prescribed, after discharge from the hospital.    # It is recommended to the patient to follow up with your outpatient psychiatric provider and PCP.  # It was discussed with the patient, the impact of alcohol, drugs, tobacco have been there overall psychiatric and medical wellbeing, and total abstinence from substance use was recommended the patient.ed.  # Prescriptions provided or sent directly to preferred pharmacy at discharge. Patient agreeable to plan. Given opportunity to ask questions. Appears to feel comfortable with discharge.    # In the event of worsening symptoms, the patient is instructed to call the crisis hotline, 911 and or go to the nearest ED for appropriate evaluation and treatment of symptoms. To follow-up with primary care provider for other medical issues, concerns and or health care needs  # Patient was  discharged to [facility] with a plan to follow up as noted below.    On day of discharge she reports she slept poor last night.  She reports her appetite is doing fair.  She reports she still does have SI with no plan/intent but that it is less intense.  She reports no HI, or VH.  She reports having AH- her sister.  She reports no Paranoia or Ideas of Reference.  She reports no issues with her medications.   She reports she is continuing to have withdrawal symptoms-  diarrhea, sweats, shakes, and body aches.  She reports having significant cravings for Cocaine which is part of what interrupted her sleep.  Discussed with her that her blood work was concerning due to her raising Creatinine.  Discussed that we would be sending her to [facility] for further workup and stabilization and she reported understanding.  She reports no other concerns at present.   Physical Findings: AIMS:  , ,  ,  ,    CIWA:    COWS:     Musculoskeletal: Strength & Muscle  Tone: within normal limits Gait & Station: normal Patient leans: N/A   Psychiatric Specialty Exam:  Presentation  General Appearance:  Appropriate for Environment  Eye Contact: Fair  Speech: Clear and Coherent; Normal Rate  Speech Volume: Decreased  Handedness: Right   Mood and Affect  Mood: Dysphoric  Affect: Congruent; Depressed   Thought Process  Thought Processes: Coherent; Goal Directed  Descriptions of Associations:Intact  Orientation:Full (Time, Place and Person)  Thought Content:Logical; WDL  History of Schizophrenia/Schizoaffective disorder:No  Duration of Psychotic Symptoms:N/A  Hallucinations:Hallucinations: Auditory Description of Auditory Hallucinations: hearing sister  Ideas of Reference:None  Suicidal Thoughts:Suicidal Thoughts: Yes, Active SI Active Intent and/or Plan: Without Intent; Without Plan  Homicidal Thoughts:Homicidal Thoughts: No   Sensorium  Memory: Immediate Fair; Recent  Fair  Judgment: Fair  Insight: Fair   Chartered certified accountant: Fair  Attention Span: Fair  Recall: [employer] of Knowledge: Fair  Language: Good   Psychomotor Activity  Psychomotor Activity: Psychomotor Activity: Normal   Assets  Assets: Communication Skills; Desire for Improvement; Financial Resources/Insurance; Resilience   Sleep  Sleep: Sleep: Poor    Physical Exam: Physical Exam Vitals and nursing note reviewed.  Constitutional:      General: She is not in acute distress.    Appearance: Normal appearance. She is obese. She is not ill-appearing or toxic-appearing.  HENT:     Head: Normocephalic and atraumatic.  Pulmonary:     Effort: Pulmonary effort is normal.  Musculoskeletal:        General: Normal range of motion.  Neurological:     General: No focal deficit present.     Mental Status: She is alert.    Review of Systems  Constitutional:  Positive for diaphoresis.  Respiratory:  Negative for cough and shortness of breath.   Cardiovascular:  Negative for chest pain.  Gastrointestinal:  Positive for diarrhea. Negative for abdominal pain, constipation, nausea and vomiting.  Musculoskeletal:  Positive for myalgias.  Neurological:  Positive for tremors. Negative for dizziness, weakness and headaches.  Psychiatric/Behavioral:  Positive for depression, hallucinations (AH) and suicidal ideas. The patient is nervous/anxious.    Blood pressure 110/66, pulse 77, temperature 98.2 F (36.8 C), temperature source Oral, resp. rate 18, height 5\' 3"  (1.6 m), weight (!) 137.8 kg, last menstrual period 07/21/2015, SpO2 100%. Body mass index is 53.82 kg/m.   Social History   Tobacco Use  Smoking Status Every Day   Types: Cigarettes  Smokeless Tobacco Not on file   Tobacco Cessation:  Prescription not provided because: Being admitted to [facility] for medical care   Blood Alcohol level:  Lab Results  Component Value Date    [facility] <10 07/30/2023   ETH <5 11/29/2015    Metabolic Disorder Labs:  Lab Results  Component Value Date   HGBA1C 11.0 (H) 07/30/2023   MPG 269 07/30/2023   MPG 171 02/02/2023   Lab Results  Component Value Date   PROLACTIN 10.4 07/30/2023   PROLACTIN 55.7 (H) 12/01/2015   Lab Results  Component Value Date   CHOL 175 07/30/2023   TRIG 181 (H) 07/30/2023   HDL 41 07/30/2023   CHOLHDL 4.3 07/30/2023   VLDL 36 07/30/2023   LDLCALC 98 07/30/2023   LDLCALC 49 02/02/2023    See Psychiatric Specialty Exam and Suicide Risk Assessment completed by Attending Physician prior to discharge.  Discharge destination:  Other:  [facility]  Is patient on multiple antipsychotic therapies at discharge:  No   Has  Patient had three or more failed trials of antipsychotic monotherapy by history:  No  Recommended Plan for Multiple Antipsychotic Therapies: NA   Allergies as of 08/01/2023       Reactions   Aspirin Rash, Other (See Comments)   high doses ago        Medication List     ASK your doctor about these medications      Indication  albuterol 108 (90 Base) MCG/ACT inhaler Commonly known as: VENTOLIN HFA Inhale 2 puffs into the lungs every 4 (four) hours as needed for wheezing or shortness of breath.    amLODipine 10 MG tablet Commonly known as: NORVASC Take 10 mg by mouth daily.    ARIPiprazole 5 MG tablet Commonly known as: ABILIFY Take 5 mg by mouth daily.    aspirin EC 81 MG tablet Take 81 mg by mouth daily. Swallow whole.    atorvastatin 80 MG tablet Commonly known as: LIPITOR Take 80 mg by mouth daily.    Bydureon BCise 2 MG/0.85ML Auij Generic drug: Exenatide ER Inject 2 mg into the skin every Tuesday.    diphenhydramine-acetaminophen 25-500 MG Tabs tablet Commonly known as: TYLENOL PM Take 1 tablet by mouth at bedtime as needed (For sleep or pain).    FLUoxetine 40 MG capsule Commonly known as: PROZAC Take 40 mg by mouth daily.     fluticasone 50 MCG/ACT nasal spray Commonly known as: FLONASE Place 1 spray into both nostrils daily as needed for allergies or rhinitis.    hydrochlorothiazide 25 MG tablet Commonly known as: HYDRODIURIL Take 25 mg by mouth daily.    Lantus SoloStar 100 UNIT/ML Solostar Pen Generic drug: insulin glargine Inject 28 Units into the skin at bedtime.    losartan 100 MG tablet Commonly known as: COZAAR Take 100 mg by mouth daily.    metFORMIN 1000 MG tablet Commonly known as: GLUCOPHAGE Take 1,000 mg by mouth 2 (two) times daily.    montelukast 10 MG tablet Commonly known as: SINGULAIR Take 10 mg by mouth at bedtime.    naphazoline-glycerin 0.012-0.25 % Soln Commonly known as: CLEAR EYES REDNESS Place 2 drops into both eyes 4 (four) times daily as needed (For allergies).    potassium chloride 10 MEQ tablet Commonly known as: KLOR-CON M Take 10 mEq by mouth daily.    pregabalin 100 MG capsule Commonly known as: LYRICA Take 100 mg by mouth 2 (two) times daily.    Symbicort 160-4.5 MCG/ACT inhaler Generic drug: budesonide-formoterol Inhale 2 puffs into the lungs 2 (two) times daily.    thiamine 100 MG tablet Commonly known as: VITAMIN B1 Take 100 mg by mouth 2 (two) times daily.    torsemide 20 MG tablet Commonly known as: DEMADEX Take 2 tablets (40 mg total) by mouth 2 (two) times daily. TAKE 2 TABLETS (40mg ) BY MOUTH TWICE DAILY         Follow-up Information     [facility]. Go to.   Specialty: Behavioral Health Why: Please go to this provider for an assessment, to obtain therapy and medication management services.  For fastest service, please go on Monday through Friday, arrive by 7:00 am for same day service. Contact information: [address] [place] [address] [phone]                Follow-up recommendations/Comments:   You are being discharged from [facility] to be admitted to [facility] for further medical  workup.  Signed: Lauro Franklin,  MD 08/01/2023, 4:58 PM

## 2023-08-01 NOTE — BHH Suicide Risk Assessment (Signed)
Suicide Risk Assessment  Discharge Assessment    [facility] Discharge Suicide Risk Assessment   Principal Problem: MDD (major depressive disorder), recurrent, severe, with psychosis (HCC) Discharge Diagnoses: Principal Problem:   MDD (major depressive disorder), recurrent, severe, with psychosis (HCC) Active Problems:   PTSD (post-traumatic stress disorder)   Cocaine abuse (HCC)   GAD (generalized anxiety disorder)  During the patient's hospitalization, patient had extensive initial psychiatric evaluation, and follow-up psychiatric evaluations every day.   Psychiatric diagnoses provided upon initial assessment: MDD, Recurrent, Severe, w/ Psychosis, GAD, PTSD, and Cocaine Abuse.   Patient's psychiatric medications were adjusted on admission: Her Prozac was stopped.  She was started on Effexor XR and Remeron.  She was continued on her home Abilify dose.   During the hospitalization, other adjustments were made to the patient's psychiatric medication regimen: None as she needed to be admitted to [facility].   Patient's care was discussed during the interdisciplinary team meeting every day during the hospitalization.   The patient is not having side effects to prescribed psychiatric medication.  Labs were reviewed with the patient, and abnormal results were discussed with the patient.  The patient denied having suicidal thoughts more than 48 hours prior to discharge.  Patient denies having homicidal thoughts.  Patient denies having auditory hallucinations.  Patient denies any visual hallucinations.  Patient denies having paranoid thoughts.  The patient is able to verbalize their individual safety plan to this provider.  It is recommended to the patient to continue psychiatric medications as prescribed, after discharge from the hospital.    It is recommended to the patient to follow up with your outpatient psychiatric provider and PCP.  Discussed with the patient, the impact of  alcohol, drugs, tobacco have been there overall psychiatric and medical wellbeing, and total abstinence from substance use was recommended the patient.  Total Time spent with patient: 30 minutes  Musculoskeletal: Strength & Muscle Tone: within normal limits Gait & Station: normal Patient leans: N/A  Psychiatric Specialty Exam  Presentation  General Appearance:  Appropriate for Environment  Eye Contact: Fair  Speech: Clear and Coherent; Normal Rate  Speech Volume: Decreased  Handedness: Right   Mood and Affect  Mood: Dysphoric  Duration of Depression Symptoms: Less than two weeks  Affect: Congruent; Depressed   Thought Process  Thought Processes: Coherent; Goal Directed  Descriptions of Associations:Intact  Orientation:Full (Time, Place and Person)  Thought Content:Logical; WDL  History of Schizophrenia/Schizoaffective disorder:No  Duration of Psychotic Symptoms:N/A  Hallucinations:Hallucinations: Auditory Description of Auditory Hallucinations: hearing sister  Ideas of Reference:None  Suicidal Thoughts:Suicidal Thoughts: Yes, Active SI Active Intent and/or Plan: Without Intent; Without Plan  Homicidal Thoughts:Homicidal Thoughts: No   Sensorium  Memory: Immediate Fair; Recent Fair  Judgment: Fair  Insight: Fair   Chartered certified accountant: Fair  Attention Span: Fair  Recall: [employer] of Knowledge: Fair  Language: Good   Psychomotor Activity  Psychomotor Activity: Psychomotor Activity: Normal   Assets  Assets: Communication Skills; Desire for Improvement; Financial Resources/Insurance; Resilience   Sleep  Sleep: Sleep: Poor   Physical Exam: Physical Exam Vitals and nursing note reviewed.  Constitutional:      General: She is not in acute distress.    Appearance: Normal appearance. She is obese. She is not ill-appearing or toxic-appearing.  HENT:     Head: Normocephalic and atraumatic.   Pulmonary:     Effort: Pulmonary effort is normal.  Musculoskeletal:        General: Normal range  of motion.  Neurological:     General: No focal deficit present.     Mental Status: She is alert.    Review of Systems  Constitutional:  Positive for diaphoresis.  Respiratory:  Negative for cough and shortness of breath.   Cardiovascular:  Negative for chest pain.  Gastrointestinal:  Positive for diarrhea. Negative for abdominal pain, constipation, nausea and vomiting.  Musculoskeletal:  Positive for myalgias.  Neurological:  Positive for tremors. Negative for dizziness, weakness and headaches.  Psychiatric/Behavioral:  Positive for depression, hallucinations (AH) and suicidal ideas. The patient is nervous/anxious.    Blood pressure 110/66, pulse 77, temperature 98.2 F (36.8 C), temperature source Oral, resp. rate 18, height 5\' 3"  (1.6 m), weight (!) 137.8 kg, last menstrual period 07/21/2015, SpO2 100%. Body mass index is 53.82 kg/m.  Mental Status Per Nursing Assessment::   On Admission:  Suicidal ideation indicated by patient, Self-harm thoughts  Demographic Factors:  Low socioeconomic status and Unemployed  Loss Factors: Decline in physical health and Financial problems/change in socioeconomic status  Historical Factors: Prior suicide attempts  Risk Reduction Factors:   NA  Continued Clinical Symptoms:  Depression:   Severe Alcohol/Substance Abuse/Dependencies More than one psychiatric diagnosis Unstable or Poor Therapeutic Relationship Previous Psychiatric Diagnoses and Treatments Medical Diagnoses and Treatments/Surgeries  Cognitive Features That Contribute To Risk:  Closed-mindedness and Thought constriction (tunnel vision)    Suicide Risk:  Severe:  Frequent, intense, and enduring suicidal ideation, specific plan, no subjective intent, but some objective markers of intent (i.e., choice of lethal method), the method is accessible, some limited preparatory  behavior, evidence of impaired self-control, severe dysphoria/symptomatology, multiple risk factors present, and few if any protective factors, particularly a lack of social support.   Follow-up Information     [facility]. Go to.   Specialty: Behavioral Health Why: Please go to this provider for an assessment, to obtain therapy and medication management services.  For fastest service, please go on Monday through Friday, arrive by 7:00 am for same day service. Contact information: [address] [place] [address] [phone]                Plan Of Care/Follow-up recommendations:  You are being admitted to [facility] for medical care and will be followed by the Consult Service.  Lauro Franklin, MD 08/01/2023, 5:04 PM

## 2023-08-01 NOTE — ED Provider Notes (Signed)
[place] EMERGENCY DEPARTMENT AT [facility] Provider Note   CSN: [id number] Arrival date & time: 08/01/23  4098     History  Chief Complaint  Patient presents with   abnormal labs    Hailey Klein is a 57 y.o. female.  57 year old female with prior medical history as detailed below presents from [facility] for evaluation.  Patient referred for possible dehydration, AKI.  Patient presented voluntarily to [facility] on the 16th for evaluation of depression and suicidal ideation.  She is at [facility] now on a voluntary basis.  Patient reports decreased urine output.  Labs suggest AKI.  Patient reports that she had been taking torsemide once daily for quite some time prior to her admission to [facility].  She reports that since being admitted she has been taking her torsemide twice daily.  She denies chest pain, shortness of breath.  She complains of persistent chronic lower extremity edema.  The history is provided by the patient and medical records.       Home Medications Prior to Admission medications   Medication Sig Start Date End Date Taking? Authorizing Provider  albuterol (VENTOLIN HFA) 108 (90 Base) MCG/ACT inhaler Inhale 2 puffs into the lungs every 4 (four) hours as needed for wheezing or shortness of breath. 03/28/22   [provider]  amLODipine (NORVASC) 10 MG tablet Take 10 mg by mouth daily.    [provider]  ARIPiprazole (ABILIFY) 5 MG tablet Take 5 mg by mouth daily.    [provider]  aspirin EC 81 MG tablet Take 81 mg by mouth daily. Swallow whole.    [provider]  atorvastatin (LIPITOR) 80 MG tablet Take 80 mg by mouth daily. 05/13/22   [provider]  BYDUREON BCISE 2 MG/0.85ML AUIJ Inject 2 mg into the skin every Tuesday. 05/13/22   [provider]  diphenhydramine-acetaminophen (TYLENOL PM) 25-500 MG TABS tablet Take 1 tablet by mouth at bedtime as needed (For sleep or pain).    [provider]   FLUoxetine (PROZAC) 40 MG capsule Take 40 mg by mouth daily. 05/13/22   [provider]  fluticasone (FLONASE) 50 MCG/ACT nasal spray Place 1 spray into both nostrils daily as needed for allergies or rhinitis. 05/13/22   [provider]  hydrochlorothiazide (HYDRODIURIL) 25 MG tablet Take 25 mg by mouth daily.    [provider]  LANTUS SOLOSTAR 100 UNIT/ML Solostar Pen Inject 28 Units into the skin at bedtime. 05/13/22   [provider]  losartan (COZAAR) 100 MG tablet Take 100 mg by mouth daily. 05/13/22   [provider]  metFORMIN (GLUCOPHAGE) 1000 MG tablet Take 1,000 mg by mouth 2 (two) times daily. 05/16/23   [provider]  montelukast (SINGULAIR) 10 MG tablet Take 10 mg by mouth at bedtime. 05/13/22   [provider]  naphazoline-glycerin (CLEAR EYES REDNESS) 0.012-0.25 % SOLN Place 2 drops into both eyes 4 (four) times daily as needed (For allergies).    [provider]  nitroGLYCERIN (NITROSTAT) 0.4 MG SL tablet Place 0.4 mg under the tongue every 5 (five) minutes as needed for chest pain. 05/03/18   [provider]  potassium chloride (KLOR-CON M) 10 MEQ tablet Take 10 mEq by mouth daily. 05/13/22   [provider]  pregabalin (LYRICA) 100 MG capsule Take 100 mg by mouth 2 (two) times daily.    [provider]  SYMBICORT 160-4.5 MCG/ACT inhaler Inhale 2 puffs into the lungs 2 (two) times daily.  05/13/22   [provider]  thiamine (VITAMIN B1) 100 MG tablet Take 100 mg by mouth 2 (two) times daily. 05/13/22   [provider]  torsemide (DEMADEX) 20 MG tablet Take 2 tablets (40 mg total) by mouth 2 (two) times daily. TAKE 2 TABLETS (40mg ) BY MOUTH TWICE DAILY Patient taking differently: Take 40 mg by mouth 2 (two) times daily. 12/19/22   Maisie Fus, MD      Allergies    Aspirin    Review of Systems   Review of Systems  All other systems reviewed and are negative.   Physical  Exam Updated Vital Signs BP 110/66 (BP Location: Right Arm)   Pulse 77   Temp 97.8 F (36.6 C) (Oral)   Resp 18   Ht 5\' 3"  (1.6 m)   Wt (!) 137.8 kg   LMP 07/21/2015   SpO2 100%   BMI 53.82 kg/m  Physical Exam Vitals and nursing note reviewed.  Constitutional:      General: She is not in acute distress.    Appearance: Normal appearance. She is well-developed.  HENT:     Head: Normocephalic and atraumatic.  Eyes:     Conjunctiva/sclera: Conjunctivae normal.     Pupils: Pupils are equal, round, and reactive to light.  Cardiovascular:     Rate and Rhythm: Normal rate and regular rhythm.     Heart sounds: Normal heart sounds.  Pulmonary:     Effort: Pulmonary effort is normal. No respiratory distress.     Breath sounds: Normal breath sounds.  Abdominal:     General: There is no distension.     Palpations: Abdomen is soft.     Tenderness: There is no abdominal tenderness.  Musculoskeletal:        General: No deformity. Normal range of motion.     Cervical back: Normal range of motion and neck supple.     Right lower leg: Edema present.     Left lower leg: Edema present.  Skin:    General: Skin is warm and dry.  Neurological:     General: No focal deficit present.     Mental Status: She is alert and oriented to person, place, and time.     ED Results / Procedures / Treatments   Labs (all labs ordered are listed, but only abnormal results are displayed) Labs Reviewed  GLUCOSE, CAPILLARY - Abnormal; Notable for the following components:      Result Value   Glucose-Capillary 281 (*)    All other components within normal limits  GLUCOSE, CAPILLARY - Abnormal; Notable for the following components:   Glucose-Capillary 143 (*)    All other components within normal limits  GLUCOSE, CAPILLARY - Abnormal; Notable for the following components:   Glucose-Capillary 207 (*)    All other components within normal limits  GLUCOSE, CAPILLARY - Abnormal; Notable for the following  components:   Glucose-Capillary 192 (*)    All other components within normal limits  COMPREHENSIVE METABOLIC PANEL - Abnormal; Notable for the following components:   CO2 21 (*)    Glucose, Bld 188 (*)    BUN 43 (*)    Creatinine, Ser 3.04 (*)    Calcium 8.3 (*)    Albumin 3.2 (*)    AST 11 (*)    GFR, Estimated 18 (*)    All other components within normal limits  GLUCOSE, CAPILLARY - Abnormal; Notable for the following components:   Glucose-Capillary 199 (*)    All  other components within normal limits  GLUCOSE, CAPILLARY - Abnormal; Notable for the following components:   Glucose-Capillary 147 (*)    All other components within normal limits  RAPID URINE DRUG SCREEN, HOSP PERFORMED  CBC WITH DIFFERENTIAL/PLATELET  COMPREHENSIVE METABOLIC PANEL  URINALYSIS, W/ REFLEX TO CULTURE (INFECTION SUSPECTED)    EKG None  Radiology No results found.  Procedures Procedures    Medications Ordered in ED Medications  acetaminophen (TYLENOL) tablet 650 mg (650 mg Oral Given 07/31/23 1135)  albuterol (VENTOLIN HFA) 108 (90 Base) MCG/ACT inhaler 2 puff (has no administration in time range)  alum & mag hydroxide-simeth (MAALOX/MYLANTA) 200-200-20 MG/5ML suspension 30 mL (has no administration in time range)  amLODipine (NORVASC) tablet 10 mg (10 mg Oral Given 08/01/23 0817)  ARIPiprazole (ABILIFY) tablet 5 mg (5 mg Oral Given 08/01/23 0817)  atorvastatin (LIPITOR) tablet 80 mg (80 mg Oral Given 08/01/23 0818)  Exenatide ER AUIJ 2 mg (2 mg Subcutaneous Not Given 07/31/23 0811)  fluticasone (FLONASE) 50 MCG/ACT nasal spray 1 spray (has no administration in time range)  hydrochlorothiazide (HYDRODIURIL) tablet 25 mg (25 mg Oral Given 08/01/23 0818)  insulin aspart (novoLOG) injection 0-20 Units (3 Units Subcutaneous Given 08/01/23 0628)  insulin glargine-yfgn (SEMGLEE) injection 28 Units (28 Units Subcutaneous Given 07/31/23 2130)  losartan (COZAAR) tablet 100 mg (100 mg Oral Given 08/01/23 0817)   magnesium hydroxide (MILK OF MAGNESIA) suspension 30 mL (has no administration in time range)  mometasone-formoterol (DULERA) 200-5 MCG/ACT inhaler 2 puff (2 puffs Inhalation Given 08/01/23 0816)  montelukast (SINGULAIR) tablet 10 mg (10 mg Oral Given 07/31/23 2127)  naphazoline-glycerin (CLEAR EYES REDNESS) ophth solution 2 drop (has no administration in time range)  nitroGLYCERIN (NITROSTAT) SL tablet 0.4 mg (has no administration in time range)  thiamine (VITAMIN B1) tablet 100 mg (100 mg Oral Given 08/01/23 0819)  pregabalin (LYRICA) capsule 100 mg (100 mg Oral Given 08/01/23 0823)  potassium chloride (KLOR-CON M) CR tablet 10 mEq (10 mEq Oral Given 08/01/23 0818)  diphenhydrAMINE (BENADRYL) capsule 50 mg (has no administration in time range)    Or  diphenhydrAMINE (BENADRYL) injection 50 mg (has no administration in time range)  pneumococcal 20-valent conjugate vaccine (PREVNAR 20) injection 0.5 mL (has no administration in time range)  nicotine polacrilex (NICORETTE) gum 2 mg (2 mg Oral Given 08/01/23 0824)  torsemide (DEMADEX) tablet 20 mg (20 mg Oral Given 08/01/23 0818)  dicyclomine (BENTYL) tablet 20 mg (has no administration in time range)  hydrOXYzine (ATARAX) tablet 25 mg (25 mg Oral Given 07/31/23 2132)  loperamide (IMODIUM) capsule 2-4 mg (has no administration in time range)  methocarbamol (ROBAXIN) tablet 500 mg (has no administration in time range)  naproxen (NAPROSYN) tablet 500 mg (has no administration in time range)  ondansetron (ZOFRAN-ODT) disintegrating tablet 4 mg (has no administration in time range)  mirtazapine (REMERON) tablet 7.5 mg (7.5 mg Oral Given 07/31/23 2128)  venlafaxine XR (EFFEXOR-XR) 24 hr capsule 37.5 mg (37.5 mg Oral Given 08/01/23 0817)    ED Course/ Medical Decision Making/ A&P Clinical Course as of 08/01/23 0857  Wed Aug 01, 2023  0844 BUN(!): 43 [PM]    Clinical Course User Index [PM] Wynetta Fines, MD                                  Medical Decision Making Amount and/or Complexity of Data Reviewed Labs: ordered. Decision-making details documented in ED Course. Radiology:  ordered.  Risk OTC drugs.    Medical Screen Complete  This patient presented to the ED with complaint of weakness, increased creatinine.  This complaint involves an extensive number of treatment options. The initial differential diagnosis includes, but is not limited to, [place] abnormality, AKI  This presentation is: Acute, Chronic, Self-Limited, Previously Undiagnosed, Uncertain Prognosis, Complicated, Systemic Symptoms, and Threat to Life/Bodily Function  Patient presents from [facility].  Patient sent for evaluation of increased creatinine.  Patient is reporting that she was taking torsemide once a day prior to being admitted.  It appears as though she was getting torsemide twice daily while at [facility].  Labs reveal AKI.  Patient is reporting decreased urine output over the last 48 hours. Patient would benefit from admission.  Hospital service is aware of case.  Additional history obtained:  External records from outside sources obtained and reviewed including prior ED visits and prior Inpatient records.    Lab Tests:  I ordered and personally interpreted labs.  The pertinent results include: CBC, CMP, glucose   Imaging Studies ordered:  I ordered imaging studies including chest x-ray I independently visualized and interpreted obtained imaging which showed NAD I agree with the radiologist interpretation.   Cardiac Monitoring:  The patient was maintained on a cardiac monitor.  I personally viewed and interpreted the cardiac monitor which showed an underlying rhythm of: NSR   Problem List / ED Course:  AKI   Reevaluation:  After the interventions noted above, I reevaluated the patient and found that they have: improved  Disposition:  After consideration of the diagnostic results and the patients response to treatment, I feel  that the patent would benefit from admission.          Final Clinical Impression(s) / ED Diagnoses Final diagnoses:  AKI (acute kidney injury) [facility])    Rx / DC Orders ED Discharge Orders     None         Wynetta Fines, MD 08/01/23 1257

## 2023-08-01 NOTE — ED Notes (Signed)
Pt was sent to room 1616 with tech.  Pt has been in ED room 4 waiting on a bed for admit.  Pt has had fluids and labs completed.  No admit orders were released in ED prior to sending pt to room 1616.  Called and gave report because the sbar did not go thru, but I did click it and it was yellow and the timer started after the room turned to ready and we waited the 40 minutes required.  I can not go back and see any of my notes.

## 2023-08-01 NOTE — Progress Notes (Signed)
   08/01/23 0043  Psych Admission Type (Psych Patients Only)  Admission Status Voluntary  Psychosocial Assessment  Patient Complaints Substance abuse  Eye Contact Fair  Facial Expression Worried  Affect Apprehensive  Speech Logical/coherent  Interaction Assertive  Motor Activity Slow  Appearance/Hygiene Disheveled  Behavior Characteristics Cooperative  Mood Anxious  Thought Process  Coherency WDL  Content WDL  Delusions WDL  Perception Hallucinations  Hallucination Auditory  Judgment Impaired  Confusion WDL  Danger to Self  Current suicidal ideation? Denies  Self-Injurious Behavior No self-injurious ideation or behavior indicators observed or expressed   Agreement Not to Harm Self Yes  Description of Agreement verbal  Danger to Others  Danger to Others None reported or observed   D: Patient in dayroom reports cravings for cocaine but was given medication and used her coping skills to get through. Pt attended evening wrap up group and was able to engage therapeutically. A: Medications administered as prescribed. Support and encouragement provided as needed.  R: Patient remains safe on the unit. Plan of care ongoing for safety and stability.

## 2023-08-01 NOTE — ED Triage Notes (Signed)
[facility] reports pt coming from [facility] for abnormal kidney labs and to be evaluated for dehydration. Pt has chronic pain but no new pain. Pt w/o complaints at this time.

## 2023-08-01 NOTE — Progress Notes (Signed)
[facility] MD Progress Note  08/01/2023 9:19 AM Hailey Klein  MRN:  [id number] Subjective:   Hailey Klein is a 57 yr old female who presented on 9/16 to [facility] with worsening depression and SI in the setting of AH, she was admitted to [facility] on 9/17.  PPHx is significant for Depression, Anxiety, PTSD, and Substance Abuse (Cocaine, Hx of EtOH), and 4 Suicide Attempts (last- [facility] 2016) and 3 Prior Psychiatric Hospitalizations (last- [facility] 11/2015), and no history of Self Injurious Behavior.    Case was discussed in the multidisciplinary team. MAR was reviewed and patient was compliant with medications.  She received PRN Tylenol and Hydroxyzine yesterday.   Psychiatric Team made the following recommendations yesterday: -Stop Prozac -Start Effexor XR 37.5 mg daily tomorrow for depression and anxiety -Start Remeron 7.5 mg QHS for depression, anxiety, and sleep -Continue Abilify 5 mg daily for augmentation and psychosis -Decrease Torsemide to 20 mg BID     On interview today patient reports she slept poor last night.  She reports her appetite is doing fair.  She reports she still does have SI with no plan/intent but that it is less intense.  She reports no HI, or VH.  She reports having AH- her sister.  She reports no Paranoia or Ideas of Reference.  She reports no issues with her medications.  She reports she is continuing to have withdrawal symptoms-  diarrhea, sweats, shakes, and body aches.  She reports having significant cravings for Cocaine which is part of what interrupted her sleep.  Discussed with her that her blood work was concerning due to her raising Creatinine.  Discussed that we would be sending her to [facility] for further workup and stabilization and she reported understanding.  She reports no other concerns at present.   Called [facility] and was in the process of being transferred to the ED provider.  However, while waiting to be connected EMS arrived and needed to discuss the patient with  them.  Will attempt to call again.    Principal Problem: MDD (major depressive disorder), recurrent, severe, with psychosis (HCC) Diagnosis: Principal Problem:   MDD (major depressive disorder), recurrent, severe, with psychosis (HCC) Active Problems:   PTSD (post-traumatic stress disorder)   Cocaine abuse (HCC)   GAD (generalized anxiety disorder)  Total Time spent with patient:  I personally spent 35 minutes on the unit in direct patient care. The direct patient care time included face-to-face time with the patient, reviewing the patient's chart, communicating with other professionals, and coordinating care. Greater than 50% of this time was spent in counseling or coordinating care with the patient regarding goals of hospitalization, psycho-education, and discharge planning needs.   Past Psychiatric History: Depression, Anxiety, PTSD, and Substance Abuse (Cocaine, Hx of EtOH), and 4 Suicide Attempts (last- [facility] 2016) and 3 Prior Psychiatric Hospitalizations (last- [facility] 11/2015), and no history of Self Injurious Behavior.   Past Medical History:  Past Medical History:  Diagnosis Date   Diabetes mellitus without complication (HCC)    H/O suicide attempt    cut wrists - 19-y-o   High cholesterol    Hypertension    Neuropathy    PTSD (post-traumatic stress disorder)     Past Surgical History:  Procedure Laterality Date   LEFT HEART CATH AND CORONARY ANGIOGRAPHY N/A 02/02/2023   Procedure: LEFT HEART CATH AND CORONARY ANGIOGRAPHY;  Surgeon: Tonny Bollman, MD;  Location: [facility] INVASIVE CV LAB;  Service: Cardiovascular;  Laterality: N/A;   Family History:  Family  History  Problem Relation Age of Onset   Alcoholism Other    Family Psychiatric  History:  No Known Diagnosis', Substance Abuse, or Suicides   Social History:  Social History   Substance and Sexual Activity  Alcohol Use Not Currently     Social History   Substance and Sexual Activity  Drug Use Yes   Types:  Marijuana, Cocaine    Social History   Socioeconomic History   Marital status: Single    Spouse name: Not on file   Number of children: 0   Years of education: Not on file   Highest education level: High school graduate  Occupational History   Occupation: Disabilty  Tobacco Use   Smoking status: Every Day    Types: Cigarettes   Smokeless tobacco: Not on file  Vaping Use   Vaping status: Never Used  Substance and Sexual Activity   Alcohol use: Not Currently   Drug use: Yes    Types: Marijuana, Cocaine   Sexual activity: Yes  Other Topics Concern   Not on file  Social History Narrative   Not on file   Social Determinants of Health   Financial Resource Strain: Medium Risk (07/11/2022)   Overall Financial Resource Strain (CARDIA)    Difficulty of Paying Living Expenses: Somewhat hard  Food Insecurity: Food Insecurity Present (07/30/2023)   Hunger Vital Sign    Worried About Running Out of Food in the Last Year: Often true    Ran Out of Food in the Last Year: Often true  Transportation Needs: Unmet Transportation Needs (07/30/2023)   PRAPARE - Administrator, Civil Service (Medical): Yes    Lack of Transportation (Non-Medical): No  Physical Activity: Not on file  Stress: Not on file  Social Connections: Not on file   Additional Social History:                         Sleep: Poor  Appetite:  Fair  Current Medications: Current Facility-Administered Medications  Medication Dose Route Frequency Provider Last Rate Last Admin   acetaminophen (TYLENOL) tablet 650 mg  650 mg Oral Q6H PRN Rankin, Shuvon B, NP   650 mg at 07/31/23 1135   albuterol (VENTOLIN HFA) 108 (90 Base) MCG/ACT inhaler 2 puff  2 puff Inhalation Q4H PRN Rankin, Shuvon B, NP       alum & mag hydroxide-simeth (MAALOX/MYLANTA) 200-200-20 MG/5ML suspension 30 mL  30 mL Oral Q4H PRN Rankin, Shuvon B, NP       amLODipine (NORVASC) tablet 10 mg  10 mg Oral Daily Rankin, Shuvon B, NP   10 mg  at 08/01/23 0817   ARIPiprazole (ABILIFY) tablet 5 mg  5 mg Oral Daily Rankin, Shuvon B, NP   5 mg at 08/01/23 0817   atorvastatin (LIPITOR) tablet 80 mg  80 mg Oral Daily Rankin, Shuvon B, NP   80 mg at 08/01/23 0818   dicyclomine (BENTYL) tablet 20 mg  20 mg Oral Q6H PRN Lauro Franklin, MD       diphenhydrAMINE (BENADRYL) capsule 50 mg  50 mg Oral TID PRN Rankin, Shuvon B, NP       Or   diphenhydrAMINE (BENADRYL) injection 50 mg  50 mg Intramuscular TID PRN Rankin, Shuvon B, NP       Exenatide ER AUIJ 2 mg  2 mg Subcutaneous Q Tue Rankin, Shuvon B, NP       fluticasone (FLONASE) 50 MCG/ACT nasal spray  1 spray  1 spray Each Nare Daily PRN Rankin, Shuvon B, NP       hydrochlorothiazide (HYDRODIURIL) tablet 25 mg  25 mg Oral Daily Rankin, Shuvon B, NP   25 mg at 08/01/23 0818   hydrOXYzine (ATARAX) tablet 25 mg  25 mg Oral Q6H PRN Lauro Franklin, MD   25 mg at 07/31/23 2132   insulin aspart (novoLOG) injection 0-20 Units  0-20 Units Subcutaneous TID WC Rankin, Shuvon B, NP   3 Units at 08/01/23 1610   insulin glargine-yfgn (SEMGLEE) injection 28 Units  28 Units Subcutaneous QHS Rankin, Shuvon B, NP   28 Units at 07/31/23 2130   loperamide (IMODIUM) capsule 2-4 mg  2-4 mg Oral PRN Lauro Franklin, MD       losartan (COZAAR) tablet 100 mg  100 mg Oral Daily Rankin, Shuvon B, NP   100 mg at 08/01/23 0817   magnesium hydroxide (MILK OF MAGNESIA) suspension 30 mL  30 mL Oral Daily PRN Rankin, Shuvon B, NP       methocarbamol (ROBAXIN) tablet 500 mg  500 mg Oral Q8H PRN Lauro Franklin, MD       mirtazapine (REMERON) tablet 7.5 mg  7.5 mg Oral QHS Lauro Franklin, MD   7.5 mg at 07/31/23 2128   mometasone-formoterol (DULERA) 200-5 MCG/ACT inhaler 2 puff  2 puff Inhalation BID Rankin, Shuvon B, NP   2 puff at 08/01/23 0816   montelukast (SINGULAIR) tablet 10 mg  10 mg Oral QHS Rankin, Shuvon B, NP   10 mg at 07/31/23 2127   naphazoline-glycerin (CLEAR EYES REDNESS) ophth  solution 2 drop  2 drop Both Eyes QID PRN Rankin, Shuvon B, NP       naproxen (NAPROSYN) tablet 500 mg  500 mg Oral BID PRN Lauro Franklin, MD       nicotine polacrilex (NICORETTE) gum 2 mg  2 mg Oral PRN Phineas Inches, MD   2 mg at 08/01/23 0824   nitroGLYCERIN (NITROSTAT) SL tablet 0.4 mg  0.4 mg Sublingual Q5 Min x 3 PRN Rankin, Shuvon B, NP       ondansetron (ZOFRAN-ODT) disintegrating tablet 4 mg  4 mg Oral Q6H PRN [place] Welcome, Mardelle Matte, MD       pneumococcal 20-valent conjugate vaccine (PREVNAR 20) injection 0.5 mL  0.5 mL Intramuscular Tomorrow-1000 Massengill, Nathan, MD       potassium chloride (KLOR-CON M) CR tablet 10 mEq  10 mEq Oral Daily Rankin, Shuvon B, NP   10 mEq at 08/01/23 0818   pregabalin (LYRICA) capsule 100 mg  100 mg Oral BID Rankin, Shuvon B, NP   100 mg at 08/01/23 9604   thiamine (VITAMIN B1) tablet 100 mg  100 mg Oral Daily Rankin, Shuvon B, NP   100 mg at 08/01/23 0819   torsemide (DEMADEX) tablet 20 mg  20 mg Oral BID Lauro Franklin, MD   20 mg at 08/01/23 0818   venlafaxine XR (EFFEXOR-XR) 24 hr capsule 37.5 mg  37.5 mg Oral Q breakfast Lauro Franklin, MD   37.5 mg at 08/01/23 5409   Current Outpatient Medications  Medication Sig Dispense Refill   albuterol (VENTOLIN HFA) 108 (90 Base) MCG/ACT inhaler Inhale 2 puffs into the lungs every 4 (four) hours as needed for wheezing or shortness of breath.     amLODipine (NORVASC) 10 MG tablet Take 10 mg by mouth daily.     ARIPiprazole (ABILIFY) 5 MG tablet Take 5 mg  by mouth daily.     aspirin EC 81 MG tablet Take 81 mg by mouth daily. Swallow whole.     atorvastatin (LIPITOR) 80 MG tablet Take 80 mg by mouth daily.     BYDUREON BCISE 2 MG/0.85ML AUIJ Inject 2 mg into the skin every Tuesday.     diphenhydramine-acetaminophen (TYLENOL PM) 25-500 MG TABS tablet Take 1 tablet by mouth at bedtime as needed (For sleep or pain).     FLUoxetine (PROZAC) 40 MG capsule Take 40 mg by mouth daily.      fluticasone (FLONASE) 50 MCG/ACT nasal spray Place 1 spray into both nostrils daily as needed for allergies or rhinitis.     hydrochlorothiazide (HYDRODIURIL) 25 MG tablet Take 25 mg by mouth daily.     LANTUS SOLOSTAR 100 UNIT/ML Solostar Pen Inject 28 Units into the skin at bedtime.     losartan (COZAAR) 100 MG tablet Take 100 mg by mouth daily.     metFORMIN (GLUCOPHAGE) 1000 MG tablet Take 1,000 mg by mouth 2 (two) times daily.     montelukast (SINGULAIR) 10 MG tablet Take 10 mg by mouth at bedtime.     naphazoline-glycerin (CLEAR EYES REDNESS) 0.012-0.25 % SOLN Place 2 drops into both eyes 4 (four) times daily as needed (For allergies).     nitroGLYCERIN (NITROSTAT) 0.4 MG SL tablet Place 0.4 mg under the tongue every 5 (five) minutes as needed for chest pain.     potassium chloride (KLOR-CON M) 10 MEQ tablet Take 10 mEq by mouth daily.     pregabalin (LYRICA) 100 MG capsule Take 100 mg by mouth 2 (two) times daily.     SYMBICORT 160-4.5 MCG/ACT inhaler Inhale 2 puffs into the lungs 2 (two) times daily.     thiamine (VITAMIN B1) 100 MG tablet Take 100 mg by mouth 2 (two) times daily.     torsemide (DEMADEX) 20 MG tablet Take 2 tablets (40 mg total) by mouth 2 (two) times daily. TAKE 2 TABLETS (40mg ) BY MOUTH TWICE DAILY (Patient taking differently: Take 40 mg by mouth 2 (two) times daily.) 120 tablet 1    Lab Results:  Results for orders placed or performed during the hospital encounter of 07/30/23 (from the past 48 hour(s))  Glucose, capillary     Status: Abnormal   Collection Time: 07/30/23  9:18 PM  Result Value Ref Range   Glucose-Capillary 281 (H) 70 - 99 mg/dL    Comment: Glucose reference range applies only to samples taken after fasting for at least 8 hours.  Glucose, capillary     Status: Abnormal   Collection Time: 07/31/23  6:59 AM  Result Value Ref Range   Glucose-Capillary 143 (H) 70 - 99 mg/dL    Comment: Glucose reference range applies only to samples taken after fasting  for at least 8 hours.  Glucose, capillary     Status: Abnormal   Collection Time: 07/31/23 11:56 AM  Result Value Ref Range   Glucose-Capillary 207 (H) 70 - 99 mg/dL    Comment: Glucose reference range applies only to samples taken after fasting for at least 8 hours.  Glucose, capillary     Status: Abnormal   Collection Time: 07/31/23  4:51 PM  Result Value Ref Range   Glucose-Capillary 192 (H) 70 - 99 mg/dL    Comment: Glucose reference range applies only to samples taken after fasting for at least 8 hours.  Glucose, capillary     Status: Abnormal   Collection Time: 07/31/23  8:32 PM  Result Value Ref Range   Glucose-Capillary 199 (H) 70 - 99 mg/dL    Comment: Glucose reference range applies only to samples taken after fasting for at least 8 hours.  Glucose, capillary     Status: Abnormal   Collection Time: 08/01/23  5:25 AM  Result Value Ref Range   Glucose-Capillary 147 (H) 70 - 99 mg/dL    Comment: Glucose reference range applies only to samples taken after fasting for at least 8 hours.  Comprehensive metabolic panel     Status: Abnormal   Collection Time: 08/01/23  6:35 AM  Result Value Ref Range   Sodium 135 135 - 145 mmol/L   Potassium 4.3 3.5 - 5.1 mmol/L   Chloride 101 98 - 111 mmol/L   CO2 21 (L) 22 - 32 mmol/L   Glucose, Bld 188 (H) 70 - 99 mg/dL    Comment: Glucose reference range applies only to samples taken after fasting for at least 8 hours.   BUN 43 (H) 6 - 20 mg/dL   Creatinine, Ser 9.14 (H) 0.44 - 1.00 mg/dL   Calcium 8.3 (L) 8.9 - 10.3 mg/dL   Total Protein 7.4 6.5 - 8.1 g/dL   Albumin 3.2 (L) 3.5 - 5.0 g/dL   AST 11 (L) 15 - 41 U/L   ALT 14 0 - 44 U/L   Alkaline Phosphatase 88 38 - 126 U/L   Total Bilirubin 0.8 0.3 - 1.2 mg/dL   GFR, Estimated 18 (L) >60 mL/min    Comment: (NOTE) Calculated using the CKD-EPI Creatinine Equation (2021)    Anion gap 13 5 - 15    Comment: Performed at [facility], [address] [place], [place]  [address]    Blood Alcohol level:  Lab Results  Component Value Date   [facility] <10 07/30/2023   ETH <5 11/29/2015    Metabolic Disorder Labs: Lab Results  Component Value Date   HGBA1C 11.0 (H) 07/30/2023   MPG 269 07/30/2023   MPG 171 02/02/2023   Lab Results  Component Value Date   PROLACTIN 10.4 07/30/2023   PROLACTIN 55.7 (H) 12/01/2015   Lab Results  Component Value Date   CHOL 175 07/30/2023   TRIG 181 (H) 07/30/2023   HDL 41 07/30/2023   CHOLHDL 4.3 07/30/2023   VLDL 36 07/30/2023   LDLCALC 98 07/30/2023   LDLCALC 49 02/02/2023    Physical Findings: AIMS:  , ,  ,  ,    CIWA:    COWS:     Musculoskeletal: Strength & Muscle Tone: within normal limits Gait & Station: normal Patient leans: N/A  Psychiatric Specialty Exam:  Presentation  General Appearance:  Appropriate for Environment  Eye Contact: Fair  Speech: Clear and Coherent; Normal Rate  Speech Volume: Decreased  Handedness: Right   Mood and Affect  Mood: Dysphoric  Affect: Congruent; Depressed   Thought Process  Thought Processes: Coherent; Goal Directed  Descriptions of Associations:Intact  Orientation:Full (Time, Place and Person)  Thought Content:Logical; WDL  History of Schizophrenia/Schizoaffective disorder:No  Duration of Psychotic Symptoms:N/A  Hallucinations:Hallucinations: Auditory Description of Auditory Hallucinations: hearing sister  Ideas of Reference:None  Suicidal Thoughts:Suicidal Thoughts: Yes, Active SI Active Intent and/or Plan: Without Intent; Without Plan  Homicidal Thoughts:Homicidal Thoughts: No   Sensorium  Memory: Immediate Fair; Recent Fair  Judgment: Fair  Insight: Fair   Chartered certified accountant: Fair  Attention Span: Fair  Recall: [employer] of Knowledge: Fair  Language: Peri Jefferson  Psychomotor Activity  Psychomotor Activity: Psychomotor Activity: Normal   Assets  Assets: Communication Skills; Desire  for Improvement; Financial Resources/Insurance; Resilience   Sleep  Sleep: Sleep: Poor    Physical Exam: Physical Exam Vitals and nursing note reviewed.  Constitutional:      General: She is not in acute distress.    Appearance: Normal appearance. She is obese. She is not ill-appearing or toxic-appearing.  HENT:     Head: Normocephalic and atraumatic.  Pulmonary:     Effort: Pulmonary effort is normal.  Musculoskeletal:        General: Normal range of motion.  Neurological:     General: No focal deficit present.     Mental Status: She is alert.    Review of Systems  Constitutional:  Positive for diaphoresis.  Respiratory:  Negative for cough and shortness of breath.   Cardiovascular:  Negative for chest pain.  Gastrointestinal:  Positive for diarrhea. Negative for abdominal pain, constipation, nausea and vomiting.  Musculoskeletal:  Positive for myalgias.  Neurological:  Positive for tremors. Negative for dizziness, weakness and headaches.   Blood pressure 110/66, pulse 77, temperature 97.8 F (36.6 C), temperature source Oral, resp. rate 18, height 5\' 3"  (1.6 m), weight (!) 137.8 kg, last menstrual period 07/21/2015, SpO2 100%. Body mass index is 53.82 kg/m.   Treatment Plan Summary: Daily contact with patient to assess and evaluate symptoms and progress in treatment and Medication management  Lakeyda Bazzle is a 57 yr old female who presented on 9/16 to [facility] with worsening depression and SI in the setting of AH, she was admitted to [facility] on 9/17.  PPHx is significant for Depression, Anxiety, PTSD, and Substance Abuse (Cocaine, Hx of EtOH), and 4 Suicide Attempts (last- [facility]) and 3 Prior Psychiatric Hospitalizations (last- [facility] 11/2015), and no history of Self Injurious Behavior.      Ishi is having significant withdrawal symptoms.  However, lab work shows that her kidney function has significantly declined.  Due to her multiple health issues and continuing to  have diarrhea she is at risk for significantly worsening so we will send her to [facility] for appropriate care/workup.  We will not make any changes to her medications at this time.  We will continue to monitor.      MDD, Recurrent, Severe w/Psychosis  GAD  PTSD: -Start Effexor XR 37.5 mg daily tomorrow for depression and anxiety -Continue Remeron 7.5 mg QHS for depression, anxiety, and sleep -Continue Abilify 5 mg daily for augmentation and psychosis     HTN: -Continue Amlodipine 10 mg daily -Continue HCTZ 25 mg daily -Continue Losartan 100 mg daily -Continue Torsemide 20 mg BID  -Continue KCl 10 mEq daily     Withdrawal: -Continue Imodium 2-4 mg PRN diarrhea -Continue Robaxin 500 mg q8 PRN muscle spasms -Continue Naproxen 500 mg BID PRN pain -Continue Zofran-ODT 4 mg q6 PRN nausea -Continue Bentyl 20 mg q6 PRN spasms -Continue Thiamine 100 mg daily for nutritional supplementation -Continue Multivitamin daily for nutritional supplementation     Nicotine Dependence: -Continue Nicotine Gum 2 mg PRN     Asthma: -Continue Albuterol 2 puff q4 PRN -Continue Dulera 2 puff BID     Diabetes: -Continue SSI -Continue Semglee 28 units QHS -Consult to Diabetic Coordinator placed     -Continue Lyrica 100 mg BID  -Continue Lipitor 80 mg daily for dyslipidemia  -Continue Singulair 10 mg QHS for allergies -Continue Clear Eyes 2 drop QID PRN allergies -Continue Flonase 1 spray each  nare daily PRN  -Continue Nitroglycerin SL 0.4 mg q5 mins x3 PRN chest pain -Continue PRN's: Tylenol, Maalox, Atarax, Milk of Magnesia    Lauro Franklin, MD 08/01/2023, 9:19 AM

## 2023-08-01 NOTE — ED Provider Notes (Signed)
[place] EMERGENCY DEPARTMENT AT [facility] Provider Note   CSN: [id number] Arrival date & time: 08/01/23  1258     History  Chief Complaint  Patient presents with   Abnormal Lab    Hailey Klein is a 57 y.o. female.  57 year old female with prior medical history as detailed below presents from [facility] for evaluation.  Patient referred for possible dehydration, AKI.  Patient presented voluntarily to [facility] on the 16th for evaluation of depression and suicidal ideation.  She is at [facility] now on a voluntary basis.  Patient reports decreased urine output.  Labs suggest AKI.  Patient reports that she had been taking torsemide once daily for quite some time prior to her admission to [facility].  She reports that since being admitted she has been taking her torsemide twice daily.  She denies chest pain, shortness of breath.  She complains of persistent chronic lower extremity edema.  The history is provided by the patient and medical records.  Abnormal Lab      Home Medications Prior to Admission medications   Medication Sig Start Date End Date Taking? Authorizing Provider  albuterol (VENTOLIN HFA) 108 (90 Base) MCG/ACT inhaler Inhale 2 puffs into the lungs every 4 (four) hours as needed for wheezing or shortness of breath. 03/28/22   [provider]  amLODipine (NORVASC) 10 MG tablet Take 10 mg by mouth daily.    [provider]  ARIPiprazole (ABILIFY) 5 MG tablet Take 5 mg by mouth daily.    [provider]  aspirin EC 81 MG tablet Take 81 mg by mouth daily. Swallow whole.    [provider]  atorvastatin (LIPITOR) 80 MG tablet Take 80 mg by mouth daily. 05/13/22   [provider]  BYDUREON BCISE 2 MG/0.85ML AUIJ Inject 2 mg into the skin every Tuesday. 05/13/22   [provider]  diphenhydramine-acetaminophen (TYLENOL PM) 25-500 MG TABS tablet Take 1 tablet by mouth at bedtime as needed (For sleep or pain).    [provider]  FLUoxetine (PROZAC) 40 MG capsule Take 40 mg by mouth daily. 05/13/22   [provider]  fluticasone (FLONASE) 50 MCG/ACT nasal spray Place 1 spray into both nostrils daily as needed for allergies or rhinitis. 05/13/22   [provider]  hydrochlorothiazide (HYDRODIURIL) 25 MG tablet Take 25 mg by mouth daily.    [provider]  LANTUS SOLOSTAR 100 UNIT/ML Solostar Pen Inject 28 Units into the skin at bedtime. 05/13/22   [provider]  losartan (COZAAR) 100 MG tablet Take 100 mg by mouth daily. 05/13/22   [provider]  metFORMIN (GLUCOPHAGE) 1000 MG tablet Take 1,000 mg by mouth 2 (two) times daily. 05/16/23   [provider]  montelukast (SINGULAIR) 10 MG tablet Take 10 mg by mouth at bedtime. 05/13/22   [provider]  naphazoline-glycerin (CLEAR EYES REDNESS) 0.012-0.25 % SOLN Place 2 drops into both eyes 4 (four) times daily as needed (For allergies).    [provider]  nitroGLYCERIN (NITROSTAT) 0.4 MG SL tablet Place 0.4 mg under the tongue every 5 (five) minutes as needed for chest pain. 05/03/18   [provider]  potassium chloride (KLOR-CON M) 10 MEQ tablet Take 10 mEq by mouth daily. 05/13/22   [provider]  pregabalin (LYRICA) 100 MG capsule Take 100 mg by mouth 2 (two) times daily.    [provider]  SYMBICORT 160-4.5 MCG/ACT inhaler Inhale 2 puffs into the lungs 2 (two)  times daily. 05/13/22   [provider]  thiamine (VITAMIN B1) 100 MG tablet Take 100 mg by mouth 2 (two) times daily. 05/13/22   [provider]  torsemide (DEMADEX) 20 MG tablet Take 2 tablets (40 mg total) by mouth 2 (two) times daily. TAKE 2 TABLETS (40mg ) BY MOUTH TWICE DAILY Patient taking differently: Take 40 mg by mouth 2 (two) times daily. 12/19/22   Maisie Fus, MD      Allergies    Aspirin    Review of Systems   Review of Systems  All other systems reviewed and are negative.   Physical  Exam Updated Vital Signs BP 105/69   Pulse 73   Temp 98.2 F (36.8 C)   Resp 16   Ht 5\' 3"  (1.6 m)   Wt (!) 138 kg   LMP 07/21/2015   SpO2 99%   BMI 53.89 kg/m  Physical Exam Vitals and nursing note reviewed.  Constitutional:      General: She is not in acute distress.    Appearance: Normal appearance. She is well-developed.  HENT:     Head: Normocephalic and atraumatic.  Eyes:     Conjunctiva/sclera: Conjunctivae normal.     Pupils: Pupils are equal, round, and reactive to light.  Cardiovascular:     Rate and Rhythm: Normal rate and regular rhythm.     Heart sounds: Normal heart sounds.  Pulmonary:     Effort: Pulmonary effort is normal. No respiratory distress.     Breath sounds: Normal breath sounds.  Abdominal:     General: There is no distension.     Palpations: Abdomen is soft.     Tenderness: There is no abdominal tenderness.  Musculoskeletal:        General: No deformity. Normal range of motion.     Cervical back: Normal range of motion and neck supple.     Right lower leg: Edema present.     Left lower leg: Edema present.  Skin:    General: Skin is warm and dry.  Neurological:     General: No focal deficit present.     Mental Status: She is alert and oriented to person, place, and time.     ED Results / Procedures / Treatments   Labs (all labs ordered are listed, but only abnormal results are displayed) Labs Reviewed - No data to display   EKG None  Radiology DG Chest Portable 1 View  Result Date: 08/01/2023 CLINICAL DATA:  Shortness of breath, chronic pain EXAM: PORTABLE CHEST - 1 VIEW COMPARISON:  02/21/2023 FINDINGS: Lungs are clear.  No pneumothorax. Heart size upper limits normal. No effusion. Visualized bones unremarkable. IMPRESSION: No acute cardiopulmonary disease. Electronically Signed   By: Corlis Leak M.D.   On: 08/01/2023 09:20    Procedures Procedures    Medications Ordered in ED Medications - No data to display   ED Course/  Medical Decision Making/ A&P                                  Medical Decision Making Risk Decision regarding hospitalization.    Medical Screen Complete  This patient presented to the ED with complaint of weakness, increased creatinine.  This complaint involves an extensive number of treatment options. The initial differential diagnosis includes, but is not limited to, Bolick abnormality, AKI  This presentation is: Acute, Chronic, Self-Limited, Previously Undiagnosed, Uncertain Prognosis, Complicated, Systemic Symptoms,  and Threat to Life/Bodily Function  Patient presents from [facility].  Patient sent for evaluation of increased creatinine.  Patient is reporting that she was taking torsemide once a day prior to being admitted.  It appears as though she was getting torsemide twice daily while at [facility].  Labs reveal AKI.  Patient is reporting decreased urine output over the last 48 hours. Patient would benefit from admission.  Hospital service is aware of case.  Additional history obtained:  External records from outside sources obtained and reviewed including prior ED visits and prior Inpatient records.    Lab Tests:  I ordered and personally interpreted labs.  The pertinent results include: CBC, CMP, glucose   Imaging Studies ordered:  I ordered imaging studies including chest x-ray I independently visualized and interpreted obtained imaging which showed NAD I agree with the radiologist interpretation.   Cardiac Monitoring:  The patient was maintained on a cardiac monitor.  I personally viewed and interpreted the cardiac monitor which showed an underlying rhythm of: NSR   Problem List / ED Course:  AKI   Reevaluation:  After the interventions noted above, I reevaluated the patient and found that they have: improved  Disposition:  After consideration of the diagnostic results and the patients response to treatment, I feel that the patent would benefit from  admission.          Final Clinical Impression(s) / ED Diagnoses Final diagnoses:  AKI (acute kidney injury) [facility])    Rx / DC Orders ED Discharge Orders     None         Wynetta Fines, MD 08/01/23 1257    Wynetta Fines, MD 08/01/23 1306

## 2023-08-01 NOTE — Plan of Care (Signed)
Problem: Health Behavior/Discharge Planning: Goal: Ability to safely manage health-related needs after discharge will improve Outcome: Not Progressing

## 2023-08-01 NOTE — H&P (Signed)
History and Physical    Patient: Hailey Klein OZH:[id number] DOB: 11/22/1965 DOA: 08/01/2023 DOS: the patient was seen and examined on 08/01/2023 PCP: [facility]  Patient coming from: Home  Chief Complaint:  Chief Complaint  Patient presents with   Abnormal Lab   HPI: Hailey Klein is a 57 y.o. female with medical history significant of type 2 diabetes, hyperlipidemia, hypertension, neuropathy, PTSD, depression, history of suicide attempt who was [facility] from [facility] due to elevated BUN and creatinine.  The patient stated she has been feeling lightheaded.  She has been taking twice daily torsemide 20 mg recently.  She is also on losartan 100 mg p.o. daily.   Positive anuria since yesterday, no flank pain, dysuria, frequency or hematuria. She denied fever, chills, rhinorrhea, sore throat, wheezing or hemoptysis.  No chest pain, palpitations, diaphoresis, PND, orthopnea or pitting edema of the lower extremities.  No abdominal pain, nausea, emesis, diarrhea, constipation, melena or hematochezia.  No polyuria, polydipsia, polyphagia or blurred vision.   Lab work: CBC showed a white count of 8.0, hemoglobin 8.9 g/dL platelets 629.  CMP showed normal electrolytes after calcium correction.  Glucose 197, BUN 45 and creatinine 3.05 mg/dL (2 days ago creatinine level was 1.92).  Albumin was 2.9 g/dL and AST 10 units/L.  The rest of the hepatic functions were normal.  Imaging: Portable 1 view chest radiograph with no acute cardiopulmonary process.   ED course: Initial vital signs were temperature 98.2 F, pulse 73, respirations 16, BP 105/69 mmHg O2 sat 99% on room air.  Patient received 1000 mL of NS bolus.  Review of Systems: As mentioned in the history of present illness. All other systems reviewed and are negative. Past Medical History:  Diagnosis Date   Diabetes mellitus without complication (HCC)    H/O suicide attempt    cut wrists - 19-y-o   High cholesterol     Hypertension    Neuropathy    PTSD (post-traumatic stress disorder)    Past Surgical History:  Procedure Laterality Date   LEFT HEART CATH AND CORONARY ANGIOGRAPHY N/A 02/02/2023   Procedure: LEFT HEART CATH AND CORONARY ANGIOGRAPHY;  Surgeon: Tonny Bollman, MD;  Location: [facility] INVASIVE CV LAB;  Service: Cardiovascular;  Laterality: N/A;   Social History:  reports that she has been smoking cigarettes. She does not have any smokeless tobacco history on file. She reports that she does not currently use alcohol. She reports current drug use. Drugs: Marijuana and Cocaine.  Allergies  Allergen Reactions   Aspirin Rash and Other (See Comments)    high doses ago    Family History  Problem Relation Age of Onset   Alcoholism Other     Prior to Admission medications   Medication Sig Start Date End Date Taking? Authorizing Provider  albuterol (VENTOLIN HFA) 108 (90 Base) MCG/ACT inhaler Inhale 2 puffs into the lungs every 4 (four) hours as needed for wheezing or shortness of breath. 03/28/22   [provider]  amLODipine (NORVASC) 10 MG tablet Take 10 mg by mouth daily.    [provider]  ARIPiprazole (ABILIFY) 5 MG tablet Take 5 mg by mouth daily.    [provider]  aspirin EC 81 MG tablet Take 81 mg by mouth daily. Swallow whole.    [provider]  atorvastatin (LIPITOR) 80 MG tablet Take 80 mg by mouth daily. 05/13/22   [provider]  BYDUREON BCISE 2 MG/0.85ML AUIJ Inject 2 mg into the skin every Tuesday.  05/13/22   [provider]  diphenhydramine-acetaminophen (TYLENOL PM) 25-500 MG TABS tablet Take 1 tablet by mouth at bedtime as needed (For sleep or pain).    [provider]  FLUoxetine (PROZAC) 40 MG capsule Take 40 mg by mouth daily. 05/13/22   [provider]  fluticasone (FLONASE) 50 MCG/ACT nasal spray Place 1 spray into both nostrils daily as needed for allergies or rhinitis. 05/13/22   [provider]   hydrochlorothiazide (HYDRODIURIL) 25 MG tablet Take 25 mg by mouth daily.    [provider]  LANTUS SOLOSTAR 100 UNIT/ML Solostar Pen Inject 28 Units into the skin at bedtime. 05/13/22   [provider]  losartan (COZAAR) 100 MG tablet Take 100 mg by mouth daily. 05/13/22   [provider]  metFORMIN (GLUCOPHAGE) 1000 MG tablet Take 1,000 mg by mouth 2 (two) times daily. 05/16/23   [provider]  montelukast (SINGULAIR) 10 MG tablet Take 10 mg by mouth at bedtime. 05/13/22   [provider]  naphazoline-glycerin (CLEAR EYES REDNESS) 0.012-0.25 % SOLN Place 2 drops into both eyes 4 (four) times daily as needed (For allergies).    [provider]  nitroGLYCERIN (NITROSTAT) 0.4 MG SL tablet Place 0.4 mg under the tongue every 5 (five) minutes as needed for chest pain. 05/03/18   [provider]  potassium chloride (KLOR-CON M) 10 MEQ tablet Take 10 mEq by mouth daily. 05/13/22   [provider]  pregabalin (LYRICA) 100 MG capsule Take 100 mg by mouth 2 (two) times daily.    [provider]  SYMBICORT 160-4.5 MCG/ACT inhaler Inhale 2 puffs into the lungs 2 (two) times daily. 05/13/22   [provider]  thiamine (VITAMIN B1) 100 MG tablet Take 100 mg by mouth 2 (two) times daily. 05/13/22   [provider]  torsemide (DEMADEX) 20 MG tablet Take 2 tablets (40 mg total) by mouth 2 (two) times daily. TAKE 2 TABLETS (40mg ) BY MOUTH TWICE DAILY Patient taking differently: Take 40 mg by mouth 2 (two) times daily. 12/19/22   Maisie Fus, MD    Physical Exam: Vitals:   08/01/23 1300  BP: 105/69  Pulse: 73  Resp: 16  Temp: 98.2 F (36.8 C)  SpO2: 99%  Weight: (!) 138 kg  Height: 5\' 3"  (1.6 m)   Physical Exam Vitals and nursing note reviewed.  Constitutional:      General: She is awake. She is not in acute distress.    Appearance: Normal appearance. She is morbidly obese. She is ill-appearing. She is not  toxic-appearing.  HENT:     Head: Normocephalic.     Nose: No rhinorrhea.     Mouth/Throat:     Mouth: Mucous membranes are dry.  Eyes:     General: No scleral icterus.    Pupils: Pupils are equal, round, and reactive to light.  Neck:     Vascular: No JVD.  Cardiovascular:     Rate and Rhythm: Normal rate and regular rhythm.     Heart sounds: S1 normal and S2 normal.  Pulmonary:     Effort: Pulmonary effort is normal.     Breath sounds: Normal breath sounds. No wheezing, rhonchi or rales.  Abdominal:     General: Bowel sounds are normal. There is no distension.     Palpations: Abdomen is soft.     Tenderness: There is no abdominal tenderness.  Musculoskeletal:     Cervical back: Neck supple.  Right lower leg: No edema.     Left lower leg: No edema.  Skin:    General: Skin is warm and dry.  Neurological:     General: No focal deficit present.     Mental Status: She is alert and oriented to person, place, and time.  Psychiatric:        Mood and Affect: Mood normal.        Behavior: Behavior normal. Behavior is cooperative.    Data Reviewed:  Results are pending, will review when available.  Assessment and Plan: Principal Problem:   AKI (acute kidney injury) (HCC) Observation/MedSurg. Continue IV fluids. Hold ARB/ACE. Hold diuretic. Hold metformin. Avoid hypotension. Avoid nephrotoxins. Monitor intake and output. As needed boluses for: - Hypotension. -Decreased urinary output Monitor renal function/electrolytes.  Active Problems:   Essential hypertension Hold losartan and diuretics. Continue amlodipine 10 mg p.o. daily.    Type 2 diabetes mellitus with hyperlipidemia (HCC) Carbohydrate modified diet. CBG monitoring with RI SS. Check hemoglobin A1c.    PTSD (post-traumatic stress disorder)   MDD (major depressive disorder), recurrent, severe, with psychosis (HCC)   Suicidal ideation Continue fluoxetine 40 mg p.o. daily. Continue aripiprazole 5 mg  p.o. daily Consult psychiatry while in the hospital. One-to-one monitoring.    Class 3 obesity (HCC) Current BMI 53.89 kg/m. Lifestyle modifications. Follow-up with closely PCP and/or bariatric clinic.    Dyspepsia Begin pantoprazole 40 mg p.o. daily. Antiacid, H2 blocker as needed.    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 Continue home inhalers.    Tobacco abuse Declined nicotine replacement therapy.    Diabetic polyneuropathy associated with type 2 diabetes mellitus (HCC) Continue pregabalin 100 mg p.o. twice daily.    Normocytic anemia Check anemia panel.   Monitor hematocrit and hemoglobin.    Moderate protein malnutrition (HCC) Protein supplementation. Continue nutritional services evaluation.      Advance Care Planning:   Code Status: Full Code   Consults: Psychiatry ().  Family Communication:   Severity of Illness: The appropriate patient status for this patient is OBSERVATION. Observation status is judged to be reasonable and necessary in order to provide the required intensity of service to ensure the patient's safety. The patient's presenting symptoms, physical exam findings, and initial radiographic and laboratory data in the context of their medical condition is felt to place them at decreased risk for further clinical deterioration. Furthermore, it is anticipated that the patient will be medically stable for discharge from the hospital within 2 midnights of admission.   Author: Bobette Mo, MD 08/01/2023 1:09 PM  For on call review [email or web address].   This document was prepared using Dragon voice recognition software and may contain some unintended transcription errors.

## 2023-08-01 NOTE — ED Triage Notes (Signed)
Patient BIB [facility] from [facility] for abnormal kidney labs and to be evaluated for dehydration. Patient has chronic pain but no new pain. No complaints.

## 2023-08-02 DIAGNOSIS — Z7984 Long term (current) use of oral hypoglycemic drugs: Secondary | ICD-10-CM | POA: Diagnosis not present

## 2023-08-02 DIAGNOSIS — J449 Chronic obstructive pulmonary disease, unspecified: Secondary | ICD-10-CM | POA: Diagnosis present

## 2023-08-02 DIAGNOSIS — Z6841 Body Mass Index (BMI) 40.0 and over, adult: Secondary | ICD-10-CM | POA: Diagnosis not present

## 2023-08-02 DIAGNOSIS — E7800 Pure hypercholesterolemia, unspecified: Secondary | ICD-10-CM | POA: Diagnosis present

## 2023-08-02 DIAGNOSIS — R1013 Epigastric pain: Secondary | ICD-10-CM | POA: Diagnosis present

## 2023-08-02 DIAGNOSIS — D631 Anemia in chronic kidney disease: Secondary | ICD-10-CM | POA: Diagnosis present

## 2023-08-02 DIAGNOSIS — Z7982 Long term (current) use of aspirin: Secondary | ICD-10-CM | POA: Diagnosis not present

## 2023-08-02 DIAGNOSIS — F333 Major depressive disorder, recurrent, severe with psychotic symptoms: Secondary | ICD-10-CM | POA: Diagnosis present

## 2023-08-02 DIAGNOSIS — E1122 Type 2 diabetes mellitus with diabetic chronic kidney disease: Secondary | ICD-10-CM | POA: Diagnosis present

## 2023-08-02 DIAGNOSIS — Z23 Encounter for immunization: Secondary | ICD-10-CM | POA: Diagnosis not present

## 2023-08-02 DIAGNOSIS — Z79899 Other long term (current) drug therapy: Secondary | ICD-10-CM | POA: Diagnosis not present

## 2023-08-02 DIAGNOSIS — F17210 Nicotine dependence, cigarettes, uncomplicated: Secondary | ICD-10-CM | POA: Diagnosis present

## 2023-08-02 DIAGNOSIS — Z794 Long term (current) use of insulin: Secondary | ICD-10-CM | POA: Diagnosis not present

## 2023-08-02 DIAGNOSIS — N1832 Chronic kidney disease, stage 3b: Secondary | ICD-10-CM | POA: Diagnosis present

## 2023-08-02 DIAGNOSIS — Z7951 Long term (current) use of inhaled steroids: Secondary | ICD-10-CM | POA: Diagnosis not present

## 2023-08-02 DIAGNOSIS — E1142 Type 2 diabetes mellitus with diabetic polyneuropathy: Secondary | ICD-10-CM | POA: Diagnosis present

## 2023-08-02 DIAGNOSIS — I129 Hypertensive chronic kidney disease with stage 1 through stage 4 chronic kidney disease, or unspecified chronic kidney disease: Secondary | ICD-10-CM | POA: Diagnosis present

## 2023-08-02 DIAGNOSIS — J984 Other disorders of lung: Secondary | ICD-10-CM | POA: Diagnosis present

## 2023-08-02 DIAGNOSIS — Z91199 Patient's noncompliance with other medical treatment and regimen due to unspecified reason: Secondary | ICD-10-CM | POA: Diagnosis not present

## 2023-08-02 DIAGNOSIS — F4310 Post-traumatic stress disorder, unspecified: Secondary | ICD-10-CM | POA: Diagnosis present

## 2023-08-02 DIAGNOSIS — N179 Acute kidney failure, unspecified: Secondary | ICD-10-CM | POA: Diagnosis present

## 2023-08-02 DIAGNOSIS — E860 Dehydration: Secondary | ICD-10-CM | POA: Diagnosis present

## 2023-08-02 DIAGNOSIS — E1169 Type 2 diabetes mellitus with other specified complication: Secondary | ICD-10-CM | POA: Diagnosis present

## 2023-08-02 LAB — COMPREHENSIVE METABOLIC PANEL
ALT: 12 U/L (ref 0–44)
AST: 10 U/L — ABNORMAL LOW (ref 15–41)
Albumin: 2.8 g/dL — ABNORMAL LOW (ref 3.5–5.0)
Alkaline Phosphatase: 90 U/L (ref 38–126)
Anion gap: 11 (ref 5–15)
BUN: 48 mg/dL — ABNORMAL HIGH (ref 6–20)
CO2: 20 mmol/L — ABNORMAL LOW (ref 22–32)
Calcium: 7.8 mg/dL — ABNORMAL LOW (ref 8.9–10.3)
Chloride: 105 mmol/L (ref 98–111)
Creatinine, Ser: 2.14 mg/dL — ABNORMAL HIGH (ref 0.44–1.00)
GFR, Estimated: 27 mL/min — ABNORMAL LOW (ref >60)
Glucose, Bld: 192 mg/dL — ABNORMAL HIGH (ref 70–99)
Potassium: 4.4 mmol/L (ref 3.5–5.1)
Sodium: 136 mmol/L (ref 135–145)
Total Bilirubin: 0.5 mg/dL (ref 0.3–1.2)
Total Protein: 6.5 g/dL (ref 6.5–8.1)

## 2023-08-02 LAB — RETICULOCYTES
Immature Retic Fract: 14.4 % (ref 2.3–15.9)
RBC.: 3.07 MIL/uL — ABNORMAL LOW (ref 3.87–5.11)
Retic Count, Absolute: 73.7 10*3/uL (ref 19.0–186.0)
Retic Ct Pct: 2.4 % (ref 0.4–3.1)

## 2023-08-02 LAB — IRON AND TIBC
Iron: 59 ug/dL (ref 28–170)
Saturation Ratios: 24 % (ref 10.4–31.8)
TIBC: 248 ug/dL — ABNORMAL LOW (ref 250–450)
UIBC: 189 ug/dL

## 2023-08-02 LAB — CBC
HCT: 29 % — ABNORMAL LOW (ref 36.0–46.0)
Hemoglobin: 9 g/dL — ABNORMAL LOW (ref 12.0–15.0)
MCH: 29.1 pg (ref 26.0–34.0)
MCHC: 31 g/dL (ref 30.0–36.0)
MCV: 93.9 fL (ref 80.0–100.0)
Platelets: 289 10*3/uL (ref 150–400)
RBC: 3.09 MIL/uL — ABNORMAL LOW (ref 3.87–5.11)
RDW: 13.1 % (ref 11.5–15.5)
WBC: 7.3 10*3/uL (ref 4.0–10.5)
nRBC: 0 % (ref 0.0–0.2)

## 2023-08-02 LAB — HIV ANTIBODY (ROUTINE TESTING W REFLEX): HIV Screen 4th Generation wRfx: NONREACTIVE

## 2023-08-02 LAB — FOLATE: Folate: 5.6 ng/mL — ABNORMAL LOW (ref >5.9)

## 2023-08-02 LAB — MAGNESIUM: Magnesium: 1.7 mg/dL (ref 1.7–2.4)

## 2023-08-02 LAB — GLUCOSE, CAPILLARY
Glucose-Capillary: 149 mg/dL — ABNORMAL HIGH (ref 70–99)
Glucose-Capillary: 157 mg/dL — ABNORMAL HIGH (ref 70–99)
Glucose-Capillary: 161 mg/dL — ABNORMAL HIGH (ref 70–99)
Glucose-Capillary: 176 mg/dL — ABNORMAL HIGH (ref 70–99)

## 2023-08-02 LAB — FERRITIN: Ferritin: 171 ng/mL (ref 11–307)

## 2023-08-02 LAB — VITAMIN B12: Vitamin B-12: 224 pg/mL (ref 180–914)

## 2023-08-02 MED ORDER — ALBUTEROL SULFATE (2.5 MG/3ML) 0.083% IN NEBU: 2.5000 mg | INHALATION_SOLUTION | RESPIRATORY_TRACT | Status: DC | PRN

## 2023-08-02 MED ORDER — POTASSIUM CHLORIDE CRYS ER 10 MEQ PO TBCR
10.0000 meq | EXTENDED_RELEASE_TABLET | Freq: Every day | ORAL | Status: DC
  Administered 2023-08-02 – 2023-08-03 (×2): 10 meq via ORAL
  Filled 2023-08-02 (×2): qty 1

## 2023-08-02 MED ORDER — THIAMINE MONONITRATE 100 MG PO TABS
100.0000 mg | ORAL_TABLET | Freq: Two times a day (BID) | ORAL | Status: DC
  Administered 2023-08-02 – 2023-08-03 (×2): 100 mg via ORAL
  Filled 2023-08-02 (×2): qty 1

## 2023-08-02 MED ORDER — ENSURE ENLIVE PO LIQD
237.0000 mL | Freq: Two times a day (BID) | ORAL | Status: DC
  Administered 2023-08-02 – 2023-08-03 (×3): 237 mL via ORAL

## 2023-08-02 MED ORDER — DIPHENHYDRAMINE HCL 25 MG PO CAPS
25.0000 mg | ORAL_CAPSULE | Freq: Every evening | ORAL | Status: DC | PRN
  Administered 2023-08-02: 25 mg via ORAL
  Filled 2023-08-02: qty 1

## 2023-08-02 MED ORDER — INFLUENZA VIRUS VACC SPLIT PF (FLUZONE) 0.5 ML IM SUSY
0.5000 mL | PREFILLED_SYRINGE | INTRAMUSCULAR | Status: AC
  Administered 2023-08-03: 0.5 mL via INTRAMUSCULAR
  Filled 2023-08-02: qty 0.5

## 2023-08-02 MED ORDER — NAPHAZOLINE-GLYCERIN 0.012-0.25 % OP SOLN: 2.0000 [drp] | Freq: Four times a day (QID) | OPHTHALMIC | Status: DC | PRN

## 2023-08-02 MED ORDER — DIPHENHYDRAMINE-APAP (SLEEP) 25-500 MG PO TABS: 1.0000 | ORAL_TABLET | Freq: Every evening | ORAL | Status: DC | PRN

## 2023-08-02 MED ORDER — LACTATED RINGERS IV SOLN: INTRAVENOUS | Status: AC

## 2023-08-02 MED ORDER — ACETAMINOPHEN 500 MG PO TABS
500.0000 mg | ORAL_TABLET | Freq: Every evening | ORAL | Status: DC | PRN
  Administered 2023-08-02: 500 mg via ORAL
  Filled 2023-08-02: qty 1

## 2023-08-02 NOTE — Plan of Care (Signed)
  Problem: Education: Goal: Ability to describe self-care measures that may prevent or decrease complications (Diabetes Survival Skills Education) will improve Outcome: Progressing   Problem: Fluid Volume: Goal: Ability to maintain a balanced intake and output will improve Outcome: Progressing   Problem: Nutritional: Goal: Maintenance of adequate nutrition will improve Outcome: Progressing Goal: Progress toward achieving an optimal weight will improve Outcome: Progressing   Problem: Education: Goal: Knowledge of General Education information will improve Description: Including pain rating scale, medication(s)/side effects and non-pharmacologic comfort measures Outcome: Progressing   Problem: Health Behavior/Discharge Planning: Goal: Ability to manage health-related needs will improve Outcome: Progressing   Problem: Clinical Measurements: Goal: Ability to maintain clinical measurements within normal limits will improve Outcome: Progressing Goal: Will remain free from infection Outcome: Progressing   Problem: Activity: Goal: Risk for activity intolerance will decrease Outcome: Progressing   Problem: Nutrition: Goal: Adequate nutrition will be maintained Outcome: Progressing   Problem: Elimination: Goal: Will not experience complications related to bowel motility Outcome: Progressing Goal: Will not experience complications related to urinary retention Outcome: Progressing   Problem: Pain Managment: Goal: General experience of comfort will improve Outcome: Progressing   Problem: Safety: Goal: Ability to remain free from injury will improve Outcome: Progressing   Problem: Education: Goal: Knowledge of warning signs, risks, and behaviors that relate to suicide ideation and self-harm behaviors will improve Outcome: Progressing   Problem: Clinical Measurements: Goal: Remain free from any harm during hospitalization Outcome: Progressing   Problem: Coping: Goal:  Ability to disclose and discuss thoughts of suicide and self-harm will improve Outcome: Progressing   Problem: Medication Management: Goal: Adhere to prescribed medication regimen Outcome: Progressing   Problem: Sleep Hygiene: Goal: Ability to obtain adequate restful sleep will improve Outcome: Progressing   Problem: Self Esteem: Goal: Ability to verbalize positive feeling about self will improve Outcome: Progressing

## 2023-08-02 NOTE — Consult Note (Signed)
Psychiatry was consulted for medication management and assistance with the transfer to [facility]). The patient is currently on Prozac and Abilify. Initially, it was recommended to discontinue Prozac and start Effexor; however, morning labs revealed a creatinine level of 3.05, prompting a transfer for the management of acute kidney injury (AKI). The patient has since stabilized, with creatinine levels returning to a baseline of 1.9, and is expected to be ready for transfer back tomorrow.  Regarding Effexor, it is not recommended to initiate at this time due to its renal dosing requirements and potential unsuitability if the patient continues to experience fluctuating kidney function. Communication has been initiated with the [facility] to inform them of the patient's return once a bed becomes available. At this time, the patient does not require an inpatient psychiatric consult. However, she will remain on the list in case a bed is not available and psychiatric services need to be resumed to ensure stability and appropriate medication management

## 2023-08-02 NOTE — Progress Notes (Addendum)
PROGRESS NOTE   Hailey Klein  WNU:[id number]    DOB: 04/12/1966    DOA: 08/01/2023  PCP: Pcp, No   I have briefly reviewed patients previous medical records in [facility].  Chief Complaint  Patient presents with   Abnormal Lab    Brief Narrative:  57 year old female with medical history significant for type II DM, HLD, HTN, neuropathy, PTSD, depression, history of suicide attempt, was sent from the behavioral health hospital due to elevated BUN and creatinine and concern for acute kidney injury.  Patient had been torsemide 20 mg twice daily, losartan 100 Mg daily, reportedly became an uric on 9/17 and also complained of feeling lightheaded.  Admission labs significant for hemoglobin of 8.9, BUN 45, creatinine 3.05.  Admitted for acute kidney injury complicating chronic kidney disease.  Improving.   Assessment & Plan:  Principal Problem:   AKI (acute kidney injury) (HCC) Active Problems:   Essential hypertension   Type 2 diabetes mellitus with hyperlipidemia (HCC)   PTSD (post-traumatic stress disorder)   MDD (major depressive disorder), recurrent, severe, with psychosis (HCC)   Class 3 obesity (HCC)   Suicidal ideation   Dyspepsia   Restrictive lung disease secondary to obesity   Tobacco abuse   Diabetic polyneuropathy associated with type 2 diabetes mellitus (HCC)   Normocytic anemia   Moderate protein malnutrition (HCC)  Acute kidney injury complicating stage IIIb CKD: Suspected due to volume depletion, medications (diuretics, ARB). Baseline creatinine may be in the 1.9 range. Presented with creatinine of 3.05 with disproportionately higher BUN consistent with dehydration. Post IV fluids, creatinine has improved to 2.14 but still not back to baseline. Continue gentle IV fluids for additional night and follow BMP in AM. Holding diuretics and ARB's.  Avoid nephrotoxics.  Essential hypertension Hold losartan and diuretics. Continue amlodipine 10 mg p.o.  daily. Controlled.   Type 2 diabetes mellitus with hyperlipidemia (HCC) A1c of 11 suggests poor outpatient control. Continue current doses of insulins including Semglee 28 units at bedtime and moderate SSI. Reasonable inpatient control although at times is mildly uncontrolled and fluctuating.  Titrate insulins as needed.   PTSD (post-traumatic stress disorder) MDD (major depressive disorder), recurrent, severe, with psychosis (HCC) Suicidal ideation Continue fluoxetine 40 mg p.o. daily. Continue aripiprazole 5 mg p.o. daily Consult psychiatry while in the hospital. One-to-one monitoring.  As per update from psychiatry team, recommend continuing Prozac while hospitalized here and no plans to initiate Effexor until she is back at [facility].   Dyspepsia Continue pantoprazole 40 mg p.o. daily.  COPD/active tobacco use Restrictive lung disease secondary to obesity Does not use CPAP at bedtime. Continue home inhalers. No clinical bronchospasm.   Tobacco abuse Declined nicotine replacement therapy. Cessation counseled.   Diabetic polyneuropathy associated with type 2 diabetes mellitus (HCC) Continue pregabalin 100 mg p.o. twice daily.   Normocytic anemia Hemoglobin stable. Anemia panel appreciated, low folate/5.6, low normal B12/224 but otherwise unremarkable.  Started oral B12 and folate supplements.   Body mass index is 54.28 kg/m./Very morbid obesity Complicates care.  Lifestyle modifications and weight loss as outpatient.  DVT prophylaxis: heparin injection 5,000 Units Start: 08/01/23 2200     Code Status: Full Code:  Family Communication: None at bedside Disposition:  Status is: Observation The patient will require care spanning > 2 midnights and should be moved to inpatient because: IV fluids and BMP monitoring     Consultants:   Psychiatry  Procedures:     Antimicrobials:      Subjective:  Seen along with safety sitter at bedside.  Reports that she has been  urinating well.  As per bedside sitter, approximately 1 L urine output since this morning.  Patient asking about her home meds including Effexor and Remeron-advised her that we will check with psychiatry.  Denies suicidal or homicidal ideations or hallucinations at this time.  Objective:   Vitals:   08/01/23 2258 08/02/23 0242 08/02/23 0658 08/02/23 0735  BP: 127/67 116/62 114/60   Pulse: 81 71 76   Resp: 18 16 18    Temp: 97.8 F (36.6 C) 98.1 F (36.7 C) 98.3 F (36.8 C)   TempSrc: Oral Oral Oral   SpO2: 98% 100% 98% 98%  Weight:      Height:        General exam: Young female, moderately built and morbidly obese sitting up comfortably in bed without distress.  Oral mucosa with borderline hydration. Respiratory system: Clear to auscultation. Respiratory effort normal. Cardiovascular system: S1 & S2 heard, RRR. No JVD, murmurs, rubs, gallops or clicks. No pedal edema. Gastrointestinal system: Abdomen is nondistended, soft and nontender. No organomegaly or masses felt. Normal bowel sounds heard. Central nervous system: Alert and oriented. No focal neurological deficits. Extremities: Symmetric 5 x 5 power. Skin: No rashes, lesions or ulcers Psychiatry: Judgement and insight appear normal. Mood & affect flat.     Data Reviewed:   I have personally reviewed following labs and imaging studies   CBC: Recent Labs  Lab 07/30/23 1129 08/01/23 1200 08/02/23 0622  WBC 10.8* 8.0 7.3  NEUTROABS 7.1 4.9  --   HGB 11.1* 8.9* 9.0*  HCT 35.5* 28.0* 29.0*  MCV 89.6 90.9 93.9  PLT 442* 279 289    Basic Metabolic Panel: Recent Labs  Lab 07/30/23 1129 08/01/23 0635 08/01/23 1200 08/02/23 0622  NA 140 135 136 136  K 4.0 4.3 4.2 4.4  CL 99 101 103 105  CO2 23 21* 22 20*  GLUCOSE 235* 188* 197* 192*  BUN 13 43* 45* 48*  CREATININE 1.92* 3.04* 3.05* 2.14*  CALCIUM 9.4 8.3* 8.3* 7.8*  MG 1.6*  --   --  1.7    Liver Function Tests: Recent Labs  Lab 07/30/23 1129  08/01/23 0635 08/01/23 1200 08/02/23 0622  AST 12* 11* 10* 10*  ALT 11 14 12 12   ALKPHOS 98 88 85 90  BILITOT 0.7 0.8 0.6 0.5  PROT 7.8 7.4 6.8 6.5  ALBUMIN 3.2* 3.2* 2.9* 2.8*    CBG: Recent Labs  Lab 08/01/23 2039 08/02/23 0753 08/02/23 1140  GLUCAP 223* 149* 157*    Microbiology Studies:  No results found for this or any previous visit (from the past 240 hour(s)).  Radiology Studies:  DG Chest Portable 1 View  Result Date: 08/01/2023 CLINICAL DATA:  Shortness of breath, chronic pain EXAM: PORTABLE CHEST - 1 VIEW COMPARISON:  02/21/2023 FINDINGS: Lungs are clear.  No pneumothorax. Heart size upper limits normal. No effusion. Visualized bones unremarkable. IMPRESSION: No acute cardiopulmonary disease. Electronically Signed   By: Corlis Leak M.D.   On: 08/01/2023 09:20    Scheduled Meds:    amLODipine  10 mg Oral Daily   ARIPiprazole  5 mg Oral Daily   aspirin EC  81 mg Oral Daily   atorvastatin  80 mg Oral Daily   feeding supplement  237 mL Oral BID BM   FLUoxetine  40 mg Oral Daily   heparin  5,000 Units Subcutaneous Q8H   [START ON 08/03/2023] influenza vac split  trivalent PF  0.5 mL Intramuscular Tomorrow-1000   insulin aspart  0-15 Units Subcutaneous TID WC   insulin glargine-yfgn  28 Units Subcutaneous QHS   mometasone-formoterol  2 puff Inhalation BID   montelukast  10 mg Oral QHS   pantoprazole  40 mg Oral Daily   pregabalin  100 mg Oral BID    Continuous Infusions:    sodium chloride       LOS: 0 days     Marcellus Scott, MD,  FACP, [facility], [facility], [facility]   [facility] Ad[facility] [place]      To contact the attending provider between 7A-7P or the covering provider during after hours 7P-7A, please log into the web site [email or web address] and access using universal [place] password for that web site. If you do not have the password, please call the hospital operator.  08/02/2023, 2:48 PM

## 2023-08-02 NOTE — Progress Notes (Signed)
1820- Patient brought up to room 1616 without notification from prior ED nurse. No SBAR report noted or secure chat to allow time for questions. Psychiatry note from 9/18 @1256  revealed patient was at a severe suicidal risk and multiple risk factor present. Patient has history of 4 suicide attempts, and 3 prior psychiatric hospitalizations. Patient came up with ED tech, left in the room by herself even though she required a sitter. ED Charge nurse and [facility] called to contact prior primary nurse and call 6E nurse for report. Floor staff supervised patient until sitter was found. After a number of attempts, Primary RN called and note filed 1922. Supervision and safety maintained through end of shift and sitter took over at 1900.

## 2023-08-03 ENCOUNTER — Other Ambulatory Visit: Payer: Self-pay

## 2023-08-03 ENCOUNTER — Inpatient Hospital Stay (HOSPITAL_COMMUNITY)
Admission: AD | Admit: 2023-08-03 | Discharge: 2023-08-10 | DRG: 885 | Disposition: A | Discharge status: Home / Self Care | Payer: Medicaid Other | Source: Intra-hospital | Attending: Psychiatry | Admitting: Psychiatry

## 2023-08-03 ENCOUNTER — Encounter (HOSPITAL_COMMUNITY): Payer: Self-pay | Admitting: Family

## 2023-08-03 DIAGNOSIS — E7800 Pure hypercholesterolemia, unspecified: Secondary | ICD-10-CM | POA: Diagnosis present

## 2023-08-03 DIAGNOSIS — K5900 Constipation, unspecified: Secondary | ICD-10-CM | POA: Diagnosis present

## 2023-08-03 DIAGNOSIS — N1832 Chronic kidney disease, stage 3b: Secondary | ICD-10-CM | POA: Diagnosis present

## 2023-08-03 DIAGNOSIS — Z9151 Personal history of suicidal behavior: Secondary | ICD-10-CM

## 2023-08-03 DIAGNOSIS — F332 Major depressive disorder, recurrent severe without psychotic features: Secondary | ICD-10-CM

## 2023-08-03 DIAGNOSIS — F4310 Post-traumatic stress disorder, unspecified: Secondary | ICD-10-CM | POA: Diagnosis present

## 2023-08-03 DIAGNOSIS — N179 Acute kidney failure, unspecified: Secondary | ICD-10-CM | POA: Diagnosis present

## 2023-08-03 DIAGNOSIS — F17210 Nicotine dependence, cigarettes, uncomplicated: Secondary | ICD-10-CM | POA: Diagnosis present

## 2023-08-03 DIAGNOSIS — F339 Major depressive disorder, recurrent, unspecified: Secondary | ICD-10-CM | POA: Diagnosis present

## 2023-08-03 DIAGNOSIS — Z7951 Long term (current) use of inhaled steroids: Secondary | ICD-10-CM

## 2023-08-03 DIAGNOSIS — R45851 Suicidal ideations: Secondary | ICD-10-CM | POA: Diagnosis present

## 2023-08-03 DIAGNOSIS — Z79899 Other long term (current) drug therapy: Secondary | ICD-10-CM

## 2023-08-03 DIAGNOSIS — I5032 Chronic diastolic (congestive) heart failure: Secondary | ICD-10-CM | POA: Diagnosis present

## 2023-08-03 DIAGNOSIS — F1011 Alcohol abuse, in remission: Secondary | ICD-10-CM | POA: Diagnosis present

## 2023-08-03 DIAGNOSIS — E1140 Type 2 diabetes mellitus with diabetic neuropathy, unspecified: Secondary | ICD-10-CM | POA: Diagnosis present

## 2023-08-03 DIAGNOSIS — Z5941 Food insecurity: Secondary | ICD-10-CM

## 2023-08-03 DIAGNOSIS — I130 Hypertensive heart and chronic kidney disease with heart failure and stage 1 through stage 4 chronic kidney disease, or unspecified chronic kidney disease: Secondary | ICD-10-CM | POA: Diagnosis present

## 2023-08-03 DIAGNOSIS — D631 Anemia in chronic kidney disease: Secondary | ICD-10-CM | POA: Diagnosis present

## 2023-08-03 DIAGNOSIS — Z794 Long term (current) use of insulin: Secondary | ICD-10-CM

## 2023-08-03 DIAGNOSIS — G8929 Other chronic pain: Secondary | ICD-10-CM | POA: Diagnosis present

## 2023-08-03 DIAGNOSIS — G4700 Insomnia, unspecified: Secondary | ICD-10-CM | POA: Diagnosis present

## 2023-08-03 DIAGNOSIS — E1122 Type 2 diabetes mellitus with diabetic chronic kidney disease: Secondary | ICD-10-CM | POA: Diagnosis present

## 2023-08-03 DIAGNOSIS — Z7982 Long term (current) use of aspirin: Secondary | ICD-10-CM

## 2023-08-03 DIAGNOSIS — Z886 Allergy status to analgesic agent status: Secondary | ICD-10-CM

## 2023-08-03 DIAGNOSIS — F411 Generalized anxiety disorder: Secondary | ICD-10-CM | POA: Diagnosis present

## 2023-08-03 DIAGNOSIS — Z6841 Body Mass Index (BMI) 40.0 and over, adult: Secondary | ICD-10-CM

## 2023-08-03 DIAGNOSIS — Z811 Family history of alcohol abuse and dependence: Secondary | ICD-10-CM

## 2023-08-03 DIAGNOSIS — F333 Major depressive disorder, recurrent, severe with psychotic symptoms: Secondary | ICD-10-CM | POA: Diagnosis present

## 2023-08-03 DIAGNOSIS — J449 Chronic obstructive pulmonary disease, unspecified: Secondary | ICD-10-CM | POA: Diagnosis present

## 2023-08-03 DIAGNOSIS — Z5900 Homelessness unspecified: Secondary | ICD-10-CM

## 2023-08-03 DIAGNOSIS — Z86718 Personal history of other venous thrombosis and embolism: Secondary | ICD-10-CM

## 2023-08-03 DIAGNOSIS — Z5986 Financial insecurity: Secondary | ICD-10-CM

## 2023-08-03 DIAGNOSIS — F1413 Cocaine abuse, unspecified with withdrawal: Secondary | ICD-10-CM | POA: Diagnosis present

## 2023-08-03 DIAGNOSIS — Z5982 Transportation insecurity: Secondary | ICD-10-CM

## 2023-08-03 HISTORY — DX: Chronic kidney disease, unspecified: N18.9

## 2023-08-03 HISTORY — DX: Major depressive disorder, recurrent severe without psychotic features: F33.2

## 2023-08-03 HISTORY — DX: Dyspnea, unspecified: R06.00

## 2023-08-03 LAB — CBC
HCT: 28.4 % — ABNORMAL LOW (ref 36.0–46.0)
Hemoglobin: 8.8 g/dL — ABNORMAL LOW (ref 12.0–15.0)
MCH: 28.9 pg (ref 26.0–34.0)
MCHC: 31 g/dL (ref 30.0–36.0)
MCV: 93.1 fL (ref 80.0–100.0)
Platelets: 290 10*3/uL (ref 150–400)
RBC: 3.05 MIL/uL — ABNORMAL LOW (ref 3.87–5.11)
RDW: 12.9 % (ref 11.5–15.5)
WBC: 7.4 10*3/uL (ref 4.0–10.5)
nRBC: 0 % (ref 0.0–0.2)

## 2023-08-03 LAB — GLUCOSE, CAPILLARY
Glucose-Capillary: 184 mg/dL — ABNORMAL HIGH (ref 70–99)
Glucose-Capillary: 177 mg/dL — ABNORMAL HIGH (ref 70–99)
Glucose-Capillary: 185 mg/dL — ABNORMAL HIGH (ref 70–99)
Glucose-Capillary: 257 mg/dL — ABNORMAL HIGH (ref 70–99)

## 2023-08-03 LAB — BASIC METABOLIC PANEL
Anion gap: 10 (ref 5–15)
BUN: 51 mg/dL — ABNORMAL HIGH (ref 6–20)
CO2: 20 mmol/L — ABNORMAL LOW (ref 22–32)
Calcium: 8.3 mg/dL — ABNORMAL LOW (ref 8.9–10.3)
Chloride: 107 mmol/L (ref 98–111)
Creatinine, Ser: 1.5 mg/dL — ABNORMAL HIGH (ref 0.44–1.00)
GFR, Estimated: 41 mL/min — ABNORMAL LOW (ref >60)
Glucose, Bld: 186 mg/dL — ABNORMAL HIGH (ref 70–99)
Potassium: 4.8 mmol/L (ref 3.5–5.1)
Sodium: 137 mmol/L (ref 135–145)

## 2023-08-03 MED ORDER — NICOTINE 14 MG/24HR TD PT24
14.0000 mg | MEDICATED_PATCH | Freq: Every day | TRANSDERMAL | Status: DC
  Filled 2023-08-03 (×9): qty 1

## 2023-08-03 MED ORDER — LORAZEPAM 2 MG/ML IJ SOLN: 2.0000 mg | Freq: Three times a day (TID) | INTRAMUSCULAR | Status: DC | PRN

## 2023-08-03 MED ORDER — ASPIRIN 81 MG PO TBEC
81.0000 mg | DELAYED_RELEASE_TABLET | Freq: Every day | ORAL | Status: DC
  Administered 2023-08-04 – 2023-08-10 (×7): 81 mg via ORAL
  Filled 2023-08-03 (×8): qty 1

## 2023-08-03 MED ORDER — VITAMIN B-1 100 MG PO TABS
100.0000 mg | ORAL_TABLET | Freq: Two times a day (BID) | ORAL | Status: DC
  Administered 2023-08-03 – 2023-08-10 (×14): 100 mg via ORAL
  Filled 2023-08-03 (×23): qty 1

## 2023-08-03 MED ORDER — AMLODIPINE BESYLATE 10 MG PO TABS
10.0000 mg | ORAL_TABLET | Freq: Every day | ORAL | Status: DC
  Administered 2023-08-04 – 2023-08-10 (×7): 10 mg via ORAL
  Filled 2023-08-03: qty 1
  Filled 2023-08-03: qty 2
  Filled 2023-08-03 (×3): qty 1
  Filled 2023-08-03 (×2): qty 2
  Filled 2023-08-03 (×4): qty 1

## 2023-08-03 MED ORDER — HALOPERIDOL LACTATE 5 MG/ML IJ SOLN: 5.0000 mg | Freq: Three times a day (TID) | INTRAMUSCULAR | Status: DC | PRN

## 2023-08-03 MED ORDER — TORSEMIDE 20 MG PO TABS: 40.0000 mg | ORAL_TABLET | Freq: Two times a day (BID) | ORAL | Status: DC

## 2023-08-03 MED ORDER — ACETAMINOPHEN 325 MG PO TABS
650.0000 mg | ORAL_TABLET | Freq: Four times a day (QID) | ORAL | Status: DC | PRN
  Administered 2023-08-03 – 2023-08-08 (×10): 650 mg via ORAL
  Filled 2023-08-03 (×11): qty 2

## 2023-08-03 MED ORDER — DIPHENHYDRAMINE HCL 50 MG/ML IJ SOLN: 50.0000 mg | Freq: Three times a day (TID) | INTRAMUSCULAR | Status: DC | PRN

## 2023-08-03 MED ORDER — FOLIC ACID 1 MG PO TABS
1.0000 mg | ORAL_TABLET | Freq: Every day | ORAL | 0 refills | Status: AC
Start: 2023-08-03 — End: ?

## 2023-08-03 MED ORDER — INSULIN ASPART 100 UNIT/ML IJ SOLN
0.0000 [IU] | Freq: Three times a day (TID) | INTRAMUSCULAR | Status: DC
  Administered 2023-08-03 – 2023-08-04 (×2): 3 [IU] via SUBCUTANEOUS
  Administered 2023-08-04: 5 [IU] via SUBCUTANEOUS
  Administered 2023-08-04 – 2023-08-05 (×2): 3 [IU] via SUBCUTANEOUS
  Administered 2023-08-05: 5 [IU] via SUBCUTANEOUS
  Administered 2023-08-05: 8 [IU] via SUBCUTANEOUS
  Administered 2023-08-06 (×2): 3 [IU] via SUBCUTANEOUS
  Administered 2023-08-06 – 2023-08-07 (×2): 11 [IU] via SUBCUTANEOUS
  Administered 2023-08-07: 3 [IU] via SUBCUTANEOUS
  Administered 2023-08-07: 5 [IU] via SUBCUTANEOUS
  Administered 2023-08-08: 3 [IU] via SUBCUTANEOUS
  Administered 2023-08-08: 8 [IU] via SUBCUTANEOUS

## 2023-08-03 MED ORDER — MONTELUKAST SODIUM 10 MG PO TABS
10.0000 mg | ORAL_TABLET | Freq: Every day | ORAL | Status: DC
  Administered 2023-08-03 – 2023-08-09 (×7): 10 mg via ORAL
  Filled 2023-08-03 (×8): qty 1

## 2023-08-03 MED ORDER — ARIPIPRAZOLE 5 MG PO TABS
5.0000 mg | ORAL_TABLET | Freq: Every day | ORAL | Status: DC
  Administered 2023-08-04 – 2023-08-08 (×5): 5 mg via ORAL
  Filled 2023-08-03 (×7): qty 1

## 2023-08-03 MED ORDER — ALUM & MAG HYDROXIDE-SIMETH 200-200-20 MG/5ML PO SUSP: 30.0000 mL | ORAL | Status: DC | PRN

## 2023-08-03 MED ORDER — ATORVASTATIN CALCIUM 80 MG PO TABS
80.0000 mg | ORAL_TABLET | Freq: Every day | ORAL | Status: DC
  Administered 2023-08-04 – 2023-08-10 (×7): 80 mg via ORAL
  Filled 2023-08-03 (×8): qty 1

## 2023-08-03 MED ORDER — POTASSIUM CHLORIDE CRYS ER 10 MEQ PO TBCR: 10.0000 meq | EXTENDED_RELEASE_TABLET | Freq: Every day | ORAL | Status: DC

## 2023-08-03 MED ORDER — PREGABALIN 100 MG PO CAPS
100.0000 mg | ORAL_CAPSULE | Freq: Two times a day (BID) | ORAL | Status: DC
  Administered 2023-08-03 – 2023-08-10 (×14): 100 mg via ORAL
  Filled 2023-08-03 (×14): qty 1

## 2023-08-03 MED ORDER — HALOPERIDOL 5 MG PO TABS
5.0000 mg | ORAL_TABLET | Freq: Three times a day (TID) | ORAL | Status: DC | PRN
  Administered 2023-08-08: 5 mg via ORAL
  Filled 2023-08-03: qty 1

## 2023-08-03 MED ORDER — MAGNESIUM HYDROXIDE 400 MG/5ML PO SUSP: 30.0000 mL | Freq: Every day | ORAL | Status: DC | PRN

## 2023-08-03 MED ORDER — CYANOCOBALAMIN 500 MCG PO TABS
500.0000 ug | ORAL_TABLET | Freq: Every day | ORAL | 0 refills | Status: AC
Start: 2023-08-03 — End: ?

## 2023-08-03 MED ORDER — DIPHENHYDRAMINE HCL 25 MG PO CAPS
50.0000 mg | ORAL_CAPSULE | Freq: Three times a day (TID) | ORAL | Status: DC | PRN
  Administered 2023-08-08: 50 mg via ORAL
  Filled 2023-08-03: qty 2

## 2023-08-03 MED ORDER — PANTOPRAZOLE SODIUM 40 MG PO TBEC
40.0000 mg | DELAYED_RELEASE_TABLET | Freq: Every day | ORAL | Status: DC
  Administered 2023-08-04 – 2023-08-05 (×2): 40 mg via ORAL
  Filled 2023-08-03 (×3): qty 1

## 2023-08-03 MED ORDER — FLUOXETINE HCL 20 MG PO CAPS
40.0000 mg | ORAL_CAPSULE | Freq: Every day | ORAL | Status: DC
  Administered 2023-08-04: 40 mg via ORAL
  Filled 2023-08-03 (×2): qty 2

## 2023-08-03 MED ORDER — POTASSIUM CHLORIDE CRYS ER 10 MEQ PO TBCR
10.0000 meq | EXTENDED_RELEASE_TABLET | Freq: Every day | ORAL | Status: DC
  Administered 2023-08-04 – 2023-08-10 (×7): 10 meq via ORAL
  Filled 2023-08-03 (×8): qty 1

## 2023-08-03 MED ORDER — MELATONIN 5 MG PO TABS
10.0000 mg | ORAL_TABLET | Freq: Once | ORAL | Status: AC
  Administered 2023-08-03: 10 mg via ORAL
  Filled 2023-08-03: qty 2

## 2023-08-03 MED ORDER — LORAZEPAM 1 MG PO TABS
2.0000 mg | ORAL_TABLET | Freq: Three times a day (TID) | ORAL | Status: DC | PRN
  Administered 2023-08-08: 2 mg via ORAL
  Filled 2023-08-03: qty 2

## 2023-08-03 NOTE — Progress Notes (Addendum)
Call placed to [facility] [phone] to provide report. Report reviewed with [facility] RN. Questions, concerns were denied at this time.

## 2023-08-03 NOTE — Discharge Summary (Signed)
Physician Discharge Summary  Hailey Klein WUJ:[id number] DOB: 07-Nov-1966  PCP: Pcp, No  Admitted from: [facility] Discharged to: Back to [facility]  Admit date: 08/01/2023 Discharge date: 08/03/2023  Recommendations for Outpatient Follow-up:    Follow-up Information     Family Physician. Schedule an appointment as soon as possible for a visit.   Why: To be seen in 1 week from discharge from [facility] with repeat labs (CBC & BMP).  TOC team at [facility] to assist patient with a new PCP appointment.        Joylene Grapes, NP. Schedule an appointment as soon as possible for a visit in 2 week(s).   Specialties: Cardiology, Family Medicine Contact information: [address] [place] [address] [phone]         Maisie Fus, MD. Schedule an appointment as soon as possible for a visit.   Specialty: Cardiology Contact information: [address] [place] [address] [phone]         MD at [facility] Follow up.   Why: Recommend the following: 1) monitor CBG 3 times daily before meals and at bedtime.  If CBGs consistently greater than 180-200 mg, consider NovoLog SSI. 2) recommend BMP twice a week.  Next on 08/06/2023.                 Home Health: None    Equipment/Devices: None    Discharge Condition: Improved and stable.   Code Status: Full Code Diet recommendation:  Discharge Diet Orders (From admission, onward)     Start     Ordered   08/03/23 0000  Diet - low sodium heart healthy        08/03/23 1156   08/03/23 0000  Diet Carb Modified        08/03/23 1156             Discharge Diagnoses:  Principal Problem:   AKI (acute kidney injury) (HCC) Active Problems:   Essential hypertension   Type 2 diabetes mellitus with hyperlipidemia (HCC)   PTSD (post-traumatic stress disorder)   MDD (major depressive disorder), recurrent,  severe, with psychosis (HCC)   Class 3 obesity (HCC)   Suicidal ideation   Dyspepsia   Restrictive lung disease secondary to obesity   Tobacco abuse   Diabetic polyneuropathy associated with type 2 diabetes mellitus (HCC)   Normocytic anemia   Moderate protein malnutrition (HCC)   Brief Summary: 57 year old female with medical history significant for type II DM, HLD, HTN, neuropathy, PTSD, depression, history of suicide attempt, was sent from the behavioral health hospital due to elevated BUN and creatinine and concern for acute kidney injury.  Patient had been torsemide 20 mg twice daily, losartan 100 Mg daily, reportedly became an uric on 9/17 and also complained of feeling lightheaded.  Admission labs significant for hemoglobin of 8.9, BUN 45, creatinine 3.05.  Admitted for acute kidney injury complicating chronic kidney disease.       Assessment & Plan:    Acute kidney injury complicating stage IIIb CKD: Suspected due to volume depletion, medications (diuretics-Demadex and HCTZ, ARB). Baseline creatinine may be in the 1.9 range. Presented with creatinine of 3.05 with disproportionately higher BUN consistent with dehydration. Post IV fluids, creatinine has improved to 2.14>1.5 Diuretics and ARB were temporarily held.  At time of discharge, resuming home dose of losartan.  However resume Demadex in 48 hours.  Discontinued HCTZ indefinitely. Recommend that while patient  is at behavioral health Hospital, monitor BMP twice a week with next one 08/06/23. Following discharge from [facility], she needs to follow-up with the PCP (currently does not have 1, requested TOC to assist) and also recommend outpatient nephrology consultation.   Essential hypertension Briefly held losartan, HCTZ and Demadex.  At time of discharge, resuming losartan and plan to resume Demadex in 48 hours from discharge with close BMP monitoring. Continue amlodipine 10 mg p.o. daily. Controlled.   Type  2 diabetes mellitus with hyperlipidemia (HCC) A1c of 11 suggests poor outpatient control. Continue current doses of insulins including Semglee 28 units at bedtime and prior home Bydureon.  Suspect medical noncompliance but not sure. Needs close outpatient follow-up with PCP to assist with improving glycemic control. At [facility], if CBGs are consistently >180-200, consider NovoLog SSI. Due to CKD and concern for adverse side effects for continued metformin, discontinued metformin at discharge.   PTSD (post-traumatic stress disorder) MDD (major depressive disorder), recurrent, severe, with psychosis (HCC) Suicidal ideation Continue fluoxetine 40 mg p.o. daily. Continue aripiprazole 5 mg p.o. daily Consult psychiatry while in the hospital. One-to-one monitoring.   As per update from psychiatry team 9/19, recommend continuing Prozac while hospitalized here and no plans to initiate Effexor until she is back at [facility]. Updated psychiatric team that patient is medically optimized for DC to [facility] today.   Dyspepsia Treated here with PPI.  Not continued at DC.   COPD/active tobacco use Restrictive lung disease secondary to obesity Does not use CPAP at bedtime. Continue home inhalers. No clinical bronchospasm.   Tobacco abuse Declined nicotine replacement therapy. Cessation counseled.   Diabetic polyneuropathy associated with type 2 diabetes mellitus (HCC) Continue pregabalin 100 mg p.o. twice daily.   Normocytic anemia Hemoglobin stable. Anemia panel appreciated, low folate/5.6, low normal B12/224 but otherwise unremarkable.  Started oral B12 and folate supplements. Follow CBC periodically as outpatient.   Body mass index is 54.28 kg/m./Very morbid obesity Complicates care.  Lifestyle modifications and weight loss as outpatient.  Consultants:   Psychiatry   Procedures:       Discharge Instructions  Discharge Instructions     (HEART FAILURE PATIENTS) Call MD:  Anytime you have any  of the following symptoms: 1) 3 pound weight gain in 24 hours or 5 pounds in 1 week 2) shortness of breath, with or without a dry hacking cough 3) swelling in the hands, feet or stomach 4) if you have to sleep on extra pillows at night in order to breathe.   Complete by: As directed    Call MD for:  difficulty breathing, headache or visual disturbances   Complete by: As directed    Call MD for:  extreme fatigue   Complete by: As directed    Call MD for:  persistant dizziness or light-headedness   Complete by: As directed    Diet - low sodium heart healthy   Complete by: As directed    Diet Carb Modified   Complete by: As directed    Increase activity slowly   Complete by: As directed         Medication List     STOP taking these medications    hydrochlorothiazide 25 MG tablet Commonly known as: HYDRODIURIL   metFORMIN 1000 MG tablet Commonly known as: GLUCOPHAGE       TAKE these medications    albuterol 108 (90 Base) MCG/ACT inhaler Commonly known as: VENTOLIN HFA Inhale 2 puffs into the lungs every 4 (four) hours  as needed for wheezing or shortness of breath.   amLODipine 10 MG tablet Commonly known as: NORVASC Take 10 mg by mouth daily.   ARIPiprazole 5 MG tablet Commonly known as: ABILIFY Take 5 mg by mouth daily.   aspirin EC 81 MG tablet Take 81 mg by mouth daily. Swallow whole.   atorvastatin 80 MG tablet Commonly known as: LIPITOR Take 80 mg by mouth daily.   Bydureon BCise 2 MG/0.85ML Auij Generic drug: Exenatide ER Inject 2 mg into the skin every Tuesday.   cyanocobalamin 500 MCG tablet Commonly known as: VITAMIN B12 Take 1 tablet (500 mcg total) by mouth daily.   diphenhydramine-acetaminophen 25-500 MG Tabs tablet Commonly known as: TYLENOL PM Take 1 tablet by mouth at bedtime as needed (For sleep or pain).   FLUoxetine 40 MG capsule Commonly known as: PROZAC Take 40 mg by mouth daily.   fluticasone 50 MCG/ACT nasal spray Commonly known  as: FLONASE Place 1 spray into both nostrils daily as needed for allergies or rhinitis.   folic acid 1 MG tablet Commonly known as: FOLVITE Take 1 tablet (1 mg total) by mouth daily.   Lantus SoloStar 100 UNIT/ML Solostar Pen Generic drug: insulin glargine Inject 28 Units into the skin at bedtime.   losartan 100 MG tablet Commonly known as: COZAAR Take 100 mg by mouth daily.   montelukast 10 MG tablet Commonly known as: SINGULAIR Take 10 mg by mouth at bedtime.   naphazoline-glycerin 0.012-0.25 % Soln Commonly known as: CLEAR EYES REDNESS Place 2 drops into both eyes 4 (four) times daily as needed (For allergies).   potassium chloride 10 MEQ tablet Commonly known as: KLOR-CON M Take 1 tablet (10 mEq total) by mouth daily. Start taking on: August 05, 2023 What changed: These instructions start on August 05, 2023. If you are unsure what to do until then, ask your doctor or other care provider.   pregabalin 100 MG capsule Commonly known as: LYRICA Take 100 mg by mouth 2 (two) times daily.   Symbicort 160-4.5 MCG/ACT inhaler Generic drug: budesonide-formoterol Inhale 2 puffs into the lungs 2 (two) times daily.   thiamine 100 MG tablet Commonly known as: VITAMIN B1 Take 100 mg by mouth 2 (two) times daily.   torsemide 20 MG tablet Commonly known as: DEMADEX Take 2 tablets (40 mg total) by mouth 2 (two) times daily. Start taking on: August 05, 2023 What changed:  additional instructions These instructions start on August 05, 2023. If you are unsure what to do until then, ask your doctor or other care provider.       Allergies  Allergen Reactions   Aspirin Rash and Other (See Comments)    high doses ago      Procedures/Studies: DG Chest Portable 1 View  Result Date: 08/01/2023 CLINICAL DATA:  Shortness of breath, chronic pain EXAM: PORTABLE CHEST - 1 VIEW COMPARISON:  02/21/2023 FINDINGS: Lungs are clear.  No pneumothorax. Heart size upper limits  normal. No effusion. Visualized bones unremarkable. IMPRESSION: No acute cardiopulmonary disease. Electronically Signed   By: Corlis Leak M.D.   On: 08/01/2023 09:20      Subjective: Denies complaints.  Urinating well.  No chest pain or dyspnea reported.  As per patient's RN, no acute issues noted.  Discharge Exam:  Vitals:   08/02/23 1507 08/02/23 1913 08/02/23 2121 08/03/23 0620  BP: (!) 120/57  110/60 125/78  Pulse: 77  78 80  Resp:   18 18  Temp: 98 F (36.7  C)  (!) 97.4 F (36.3 C) 98.7 F (37.1 C)  TempSrc: Oral  Oral Oral  SpO2: 97% 97% 98% 97%  Weight:      Height:      Patient evaluated along with her female Tourist information centre manager at bedside.  General exam: Young female, moderately built and morbidly obese sitting up comfortably in bed without distress.  Oral mucosa moist Respiratory system: Clear to auscultation. Respiratory effort normal. Cardiovascular system: S1 & S2 heard, RRR. No JVD, murmurs, rubs, gallops or clicks.  Trace bilateral ankle edema. Gastrointestinal system: Abdomen is nondistended, soft and nontender. No organomegaly or masses felt. Normal bowel sounds heard. Central nervous system: Alert and oriented. No focal neurological deficits. Extremities: Symmetric 5 x 5 power. Skin: No rashes, lesions or ulcers Psychiatry: Judgement and insight appear normal. Mood & affect flat.     The results of significant diagnostics from this hospitalization (including imaging, microbiology, ancillary and laboratory) are listed below for reference.     Microbiology: No results found for this or any previous visit (from the past 240 hour(s)).   Labs: CBC: Recent Labs  Lab 07/30/23 1129 08/01/23 1200 08/02/23 0622 08/03/23 0607  WBC 10.8* 8.0 7.3 7.4  NEUTROABS 7.1 4.9  --   --   HGB 11.1* 8.9* 9.0* 8.8*  HCT 35.5* 28.0* 29.0* 28.4*  MCV 89.6 90.9 93.9 93.1  PLT 442* 279 289 290    Basic Metabolic Panel: Recent Labs  Lab 07/30/23 1129 08/01/23 0635  08/01/23 1200 08/02/23 0622 08/03/23 0607  NA 140 135 136 136 137  K 4.0 4.3 4.2 4.4 4.8  CL 99 101 103 105 107  CO2 23 21* 22 20* 20*  GLUCOSE 235* 188* 197* 192* 186*  BUN 13 43* 45* 48* 51*  CREATININE 1.92* 3.04* 3.05* 2.14* 1.50*  CALCIUM 9.4 8.3* 8.3* 7.8* 8.3*  MG 1.6*  --   --  1.7  --     Liver Function Tests: Recent Labs  Lab 07/30/23 1129 08/01/23 0635 08/01/23 1200 08/02/23 0622  AST 12* 11* 10* 10*  ALT 11 14 12 12   ALKPHOS 98 88 85 90  BILITOT 0.7 0.8 0.6 0.5  PROT 7.8 7.4 6.8 6.5  ALBUMIN 3.2* 3.2* 2.9* 2.8*    CBG: Recent Labs  Lab 08/02/23 1140 08/02/23 1714 08/02/23 2112 08/03/23 0736 08/03/23 1155  GLUCAP 157* 161* 176* 184* 177*    Anemia work up Recent Labs    08/02/23 0622  VITAMINB12 224  FOLATE 5.6*  FERRITIN 171  TIBC 248*  IRON 59  RETICCTPCT 2.4    Time coordinating discharge: 35 minutes  SIGNED:  Marcellus Scott, MD,  FACP, [facility], [facility], [facility]   [facility] Physician Advisor [place]     To contact the attending provider between 7A-7P or the covering provider during after hours 7P-7A, please log into the web site [email or web address] and access using universal [place] password for that web site. If you do not have the password, please call the hospital operator.

## 2023-08-03 NOTE — [facility] Group Notes (Signed)
[facility] Group Notes:  (Nursing/MHT/Case Management/Adjunct)  Date:  08/03/2023  Time:  9:27 PM  Type of Therapy:  Group Therapy  Participation Level:  Active  Participation Quality:  Attentive  Affect:  Appropriate  Cognitive:  Appropriate  Insight:  Good  Engagement in Group:  Improving  Modes of Intervention:  Discussion  Summary of Progress/Problems:Pt attended AA Meeting  Keith Cancio E Alee Gressman 08/03/2023, 9:27 PM

## 2023-08-03 NOTE — TOC Transition Note (Signed)
Transition of Care [facility]) - CM/SW Discharge Note   Patient Details  Name: Deloise Brosius MRN: [id number] Date of Birth: Sep 13, 1966  [facility] CM/SW Contact:  Beckie Busing, RN Phone [phone]  08/03/2023, 12:27 PM   Clinical Narrative:    Patient to discharge to behavioral health hospital. Voluntary consent has been signed and faxed to [facility]. Transportation has been set up with safe transport for 2pm transport. No other TOC needs noted at this time. TOC will sign off.          Patient Goals and CMS Choice      Discharge Placement                         Discharge Plan and Services Additional resources added to the After Visit Summary for                                       Social Determinants of Health (SDOH) Interventions SDOH Screenings   Food Insecurity: Food Insecurity Present (08/01/2023)  Housing: High Risk (08/01/2023)  Transportation Needs: Unmet Transportation Needs (08/01/2023)  Utilities: Not At Risk (08/01/2023)  Alcohol Screen: Low Risk  (07/30/2023)  Financial Resource Strain: Medium Risk (07/11/2022)  Tobacco Use: High Risk (08/01/2023)     Readmission Risk Interventions    06/23/2022    4:53 PM  Readmission Risk Prevention Plan  Transportation Screening Complete  PCP or Specialist Appt within 3-5 Days Complete  HRI or Home Care Consult Complete  Social Work Consult for Recovery Care Planning/Counseling Complete  Palliative Care Screening Not Applicable  Medication Review Oceanographer) Complete

## 2023-08-03 NOTE — Discharge Instructions (Signed)

## 2023-08-03 NOTE — Consult Note (Signed)
Pt was accepted to [facility]  20/Sep/2024  at 1400; Bed Assignment 307-1   Pt meets inpatient criteria per Caryn Bee, NP   Attending Physician will be Dr. Sherron Flemings   YN:WGNFA Depression Disorder   Report can be called to: - Adult unit:  [phone]   Pt can arrive after 1400   Please fax Voluntary paperwork to [phone]-must have both before the patient can arrive at [facility].    Care team communication made with the following: [facility], Dr. Waymon Amato, Gretta Cool, MD, Jonetta Speak, LCSW, [place], [place]  Caryn Bee, NP.     Psychiatry consult service to sign off. Safe transport has been called and scheduled.

## 2023-08-03 NOTE — Progress Notes (Signed)
No change from am assessment. The pt is stable at time of discharge.

## 2023-08-03 NOTE — Plan of Care (Signed)

## 2023-08-03 NOTE — Progress Notes (Signed)
Patient ID: Hailey Klein, female   DOB: 31-Oct-1966, 57 y.o.   MRN: [id number] Admit note. This is a readmission for this patient status post medical admission for acute medical issue (acute kidney injury). [name] reports she is '' doing well. '' Just need to deal with the depression. Pt reports she is feeling better and no longer feels dizzy and lightheaded. Pt oriented to unit. All belongings remain in locker 10 from previous admission. Pt denies any SI HI or AV Hallucinations currently. Skin assessed and no contraband found, however noted dry skin to bilateral feet with flaky patches across lower legs and feet. Pt is safe, will con' t to monitor.

## 2023-08-03 NOTE — Progress Notes (Signed)
Patient is medically optimized and cleared for DC to next level of care i.e. [facility].  Communicated with the psychiatry team.  Marcellus Scott, MD,  FACP, [facility], [facility], [facility]   [facility] [place]     To contact the attending provider between 7A-7P or the covering provider during after hours 7P-7A, please log into the web site [email or web address] and access using universal  password for that web site. If you do not have the password, please call the hospital operator.

## 2023-08-03 NOTE — Plan of Care (Signed)
  Problem: Education: Goal: Understanding of CV disease, CV risk reduction, and recovery process will improve Outcome: Progressing Goal: Individualized Educational Video(s) Outcome: Progressing   Problem: Activity: Goal: Ability to return to baseline activity level will improve Outcome: Progressing   Problem: Cardiovascular: Goal: Ability to achieve and maintain adequate cardiovascular perfusion will improve Outcome: Progressing Goal: Vascular access site(s) Level 0-1 will be maintained Outcome: Progressing   Problem: Health Behavior/Discharge Planning: Goal: Ability to safely manage health-related needs after discharge will improve Outcome: Progressing   Problem: Education: Goal: Ability to describe self-care measures that may prevent or decrease complications (Diabetes Survival Skills Education) will improve Outcome: Progressing Goal: Individualized Educational Video(s) Outcome: Progressing   Problem: Coping: Goal: Ability to adjust to condition or change in health will improve Outcome: Progressing   Problem: Fluid Volume: Goal: Ability to maintain a balanced intake and output will improve Outcome: Progressing   Problem: Health Behavior/Discharge Planning: Goal: Ability to identify and utilize available resources and services will improve Outcome: Progressing Goal: Ability to manage health-related needs will improve Outcome: Progressing   Problem: Metabolic: Goal: Ability to maintain appropriate glucose levels will improve Outcome: Progressing   Problem: Nutritional: Goal: Maintenance of adequate nutrition will improve Outcome: Progressing Goal: Progress toward achieving an optimal weight will improve Outcome: Progressing   Problem: Skin Integrity: Goal: Risk for impaired skin integrity will decrease Outcome: Progressing   Problem: Tissue Perfusion: Goal: Adequacy of tissue perfusion will improve Outcome: Progressing   Problem: Education: Goal: Knowledge  of General Education information will improve Description: Including pain rating scale, medication(s)/side effects and non-pharmacologic comfort measures Outcome: Progressing   Problem: Health Behavior/Discharge Planning: Goal: Ability to manage health-related needs will improve Outcome: Progressing   Problem: Clinical Measurements: Goal: Ability to maintain clinical measurements within normal limits will improve Outcome: Progressing Goal: Will remain free from infection Outcome: Progressing Goal: Diagnostic test results will improve Outcome: Progressing Goal: Respiratory complications will improve Outcome: Progressing Goal: Cardiovascular complication will be avoided Outcome: Progressing   Problem: Activity: Goal: Risk for activity intolerance will decrease Outcome: Progressing   Problem: Nutrition: Goal: Adequate nutrition will be maintained Outcome: Progressing   Problem: Coping: Goal: Level of anxiety will decrease Outcome: Progressing   Problem: Elimination: Goal: Will not experience complications related to bowel motility Outcome: Progressing Goal: Will not experience complications related to urinary retention Outcome: Progressing   Problem: Pain Managment: Goal: General experience of comfort will improve Outcome: Progressing   Problem: Safety: Goal: Ability to remain free from injury will improve Outcome: Progressing   Problem: Skin Integrity: Goal: Risk for impaired skin integrity will decrease Outcome: Progressing   Problem: Education: Goal: Knowledge of warning signs, risks, and behaviors that relate to suicide ideation and self-harm behaviors will improve Outcome: Progressing   Problem: Clinical Measurements: Goal: Remain free from any harm during hospitalization Outcome: Progressing   Problem: Coping: Goal: Ability to disclose and discuss thoughts of suicide and self-harm will improve Outcome: Progressing   Problem: Medication  Management: Goal: Adhere to prescribed medication regimen Outcome: Progressing   Problem: Sleep Hygiene: Goal: Ability to obtain adequate restful sleep will improve Outcome: Progressing   Problem: Self Esteem: Goal: Ability to verbalize positive feeling about self will improve Outcome: Progressing

## 2023-08-04 LAB — GLUCOSE, CAPILLARY
Glucose-Capillary: 167 mg/dL — ABNORMAL HIGH (ref 70–99)
Glucose-Capillary: 185 mg/dL — ABNORMAL HIGH (ref 70–99)
Glucose-Capillary: 231 mg/dL — ABNORMAL HIGH (ref 70–99)
Glucose-Capillary: 223 mg/dL — ABNORMAL HIGH (ref 70–99)

## 2023-08-04 MED ORDER — MIRTAZAPINE 15 MG PO TABS
7.5000 mg | ORAL_TABLET | Freq: Every evening | ORAL | Status: DC | PRN
  Administered 2023-08-04 – 2023-08-05 (×3): 7.5 mg via ORAL
  Filled 2023-08-04 (×3): qty 1

## 2023-08-04 MED ORDER — NICOTINE POLACRILEX 2 MG MT GUM
2.0000 mg | CHEWING_GUM | OROMUCOSAL | Status: DC | PRN
  Administered 2023-08-05 – 2023-08-10 (×6): 2 mg via ORAL
  Filled 2023-08-04 (×4): qty 1

## 2023-08-04 MED ORDER — FOLIC ACID 1 MG PO TABS
1.0000 mg | ORAL_TABLET | Freq: Every day | ORAL | Status: DC
  Administered 2023-08-04 – 2023-08-10 (×7): 1 mg via ORAL
  Filled 2023-08-04 (×10): qty 1

## 2023-08-04 MED ORDER — LOSARTAN POTASSIUM 50 MG PO TABS
100.0000 mg | ORAL_TABLET | Freq: Every day | ORAL | Status: DC
  Administered 2023-08-04 – 2023-08-10 (×7): 100 mg via ORAL
  Filled 2023-08-04 (×9): qty 2

## 2023-08-04 MED ORDER — VENLAFAXINE HCL ER 37.5 MG PO CP24
37.5000 mg | ORAL_CAPSULE | Freq: Every day | ORAL | Status: DC
  Administered 2023-08-05 – 2023-08-06 (×2): 37.5 mg via ORAL
  Filled 2023-08-04 (×4): qty 1

## 2023-08-04 NOTE — Plan of Care (Signed)
  Problem: Education: Goal: Knowledge of [facility] [facility] Education information/materials will improve Outcome: Progressing Goal: Emotional status will improve Outcome: Progressing Goal: Mental status will improve Outcome: Progressing Goal: Verbalization of understanding the information provided will improve Outcome: Progressing   Problem: Activity: Goal: Interest or engagement in activities will improve Outcome: Progressing Goal: Sleeping patterns will improve Outcome: Progressing   Problem: Coping: Goal: Ability to verbalize frustrations and anger appropriately will improve Outcome: Progressing Goal: Ability to demonstrate self-control will improve Outcome: Progressing   Problem: Health Behavior/Discharge Planning: Goal: Identification of resources available to assist in meeting health care needs will improve Outcome: Progressing Goal: Compliance with treatment plan for underlying cause of condition will improve Outcome: Progressing   Problem: Physical Regulation: Goal: Ability to maintain clinical measurements within normal limits will improve Outcome: Progressing   Problem: Safety: Goal: Periods of time without injury will increase Outcome: Progressing   Problem: Education: Goal: Understanding of CV disease, CV risk reduction, and recovery process will improve Outcome: Progressing Goal: Individualized Educational Video(s) Outcome: Progressing   Problem: Activity: Goal: Ability to return to baseline activity level will improve Outcome: Progressing   Problem: Cardiovascular: Goal: Ability to achieve and maintain adequate cardiovascular perfusion will improve Outcome: Progressing Goal: Vascular access site(s) Level 0-1 will be maintained Outcome: Progressing   Problem: Health Behavior/Discharge Planning: Goal: Ability to safely manage health-related needs after discharge will improve Outcome: Progressing   Problem: Education: Goal: Ability to describe  self-care measures that may prevent or decrease complications (Diabetes Survival Skills Education) will improve Outcome: Progressing Goal: Individualized Educational Video(s) Outcome: Progressing   Problem: Coping: Goal: Ability to adjust to condition or change in health will improve Outcome: Progressing   Problem: Fluid Volume: Goal: Ability to maintain a balanced intake and output will improve Outcome: Progressing   Problem: Health Behavior/Discharge Planning: Goal: Ability to identify and utilize available resources and services will improve Outcome: Progressing Goal: Ability to manage health-related needs will improve Outcome: Progressing   Problem: Metabolic: Goal: Ability to maintain appropriate glucose levels will improve Outcome: Progressing   Problem: Nutritional: Goal: Maintenance of adequate nutrition will improve Outcome: Progressing Goal: Progress toward achieving an optimal weight will improve Outcome: Progressing   Problem: Skin Integrity: Goal: Risk for impaired skin integrity will decrease Outcome: Progressing   Problem: Tissue Perfusion: Goal: Adequacy of tissue perfusion will improve Outcome: Progressing

## 2023-08-04 NOTE — Progress Notes (Signed)
Patient is alert and oriented x3. Cooperative with care. Patient reports chronic bilateral lower extremities and lower back pain, rates her pain 10/10. Patient rates her anxiety 8/10 and depression 8/10. Patient denies current SI, HI, and VH, but reports "I still hear my sister telling me to come join her." She stated her sister died a year ago. Patient verbally contract for safety. Emotional support provided, patient received PRN Tylenol for pain and tolerates scheduled medication without any side effects reported.  CBG at HS was 257, no physical symptoms reported.No s/s of distress noted. Patient kept safe in and on the unit. Routine safety checks ongoing.   08/03/23 2145  Psych Admission Type (Psych Patients Only)  Admission Status Voluntary  Psychosocial Assessment  Patient Complaints Anxiety;Depression;Insomnia  Eye Contact Fair  Facial Expression Animated  Affect Appropriate to circumstance  Speech Logical/coherent  Interaction Assertive  Motor Activity Slow  Appearance/Hygiene In hospital gown  Behavior Characteristics Cooperative;Appropriate to situation  Mood Anxious  Thought Process  Coherency WDL  Content WDL  Delusions None reported or observed  Perception Hallucinations  Hallucination Auditory  Judgment Impaired  Confusion None  Danger to Self  Current suicidal ideation? Denies  Self-Injurious Behavior No self-injurious ideation or behavior indicators observed or expressed   Agreement Not to Harm Self Yes  Description of Agreement verbal  Danger to Others  Danger to Others None reported or observed

## 2023-08-04 NOTE — Group Note (Signed)
Date:  08/04/2023 Time:  10:07 AM  Group Topic/Focus:  Goals Group:   The focus of this group is to help patients establish daily goals to achieve during treatment and discuss how the patient can incorporate goal setting into their daily lives to aide in recovery.    Participation Level:  Active  Participation Quality:  Appropriate  Affect:  Appropriate  Cognitive:  Appropriate  Insight: Appropriate  Engagement in Group:  Limited  Modes of Intervention:  Discussion  Additional Comments:     Reymundo Poll 08/04/2023, 10:07 AM

## 2023-08-04 NOTE — Plan of Care (Signed)
Problem: Education: Goal: Knowledge of [place] [facility] Education information/materials will improve Outcome: Progressing   Problem: Education: Goal: Emotional status will improve Outcome: Progressing   Problem: Education: Goal: Mental status will improve Outcome: Progressing   Problem: Activity: Goal: Sleeping patterns will improve Outcome: Progressing   Problem: Safety: Goal: Periods of time without injury will increase Outcome: Progressing

## 2023-08-04 NOTE — [facility] Group Notes (Signed)
LCSW Wellness Group Note     08/04/2023 10:00am   Type of Group and Topic: Psychoeducational Group:  Wellness   Participation Level:  did not attend   Description of Group   Wellness group introduces the topic and its focus on developing healthy habits across the spectrum and its relationship to a decrease in hospital admissions.  Six areas of wellness are discussed: physical, social spiritual, intellectual, occupational, and emotional.  Patients are asked to consider their current wellness habits and to identify areas of wellness where they are interested and able to focus on improvements.     Therapeutic Goals Patients will understand components of wellness and how they can positively impact overall health.  Patients will identify areas of wellness where they have developed good habits. Patients will identify areas of wellness where they would like to make improvements.      Summary of Patient Progress         Therapeutic Modalities: Cognitive Behavioral Therapy Psychoeducation       Lorri Frederick, LCSW

## 2023-08-04 NOTE — H&P (Signed)
Psychiatric Admission Assessment Adult  Patient Identification: Hailey Klein MRN:  [id number] Date of Evaluation:  08/04/2023 Chief Complaint:  MDD (major depressive disorder), recurrent episode, severe (HCC) [F33.2] Principal Diagnosis: MDD (major depressive disorder), recurrent episode, severe (HCC) Diagnosis:  Principal Problem:   MDD (major depressive disorder), recurrent episode, severe (HCC)    CC: "I've been through a lot"  Hailey Klein is a 57 y.o. female  with a past psychiatric history of depression, anxiety, PTSD, and substance abuse (Cocaine, Hx of EtOH), and 4 Suicide Attempts (last- [facility] 2015-08-15) and 3 Prior Psychiatric Hospitalizations. Patient was recently discharged from [facility] on 9/18 and transferred to [facility], admitted to the medical floor (9/18-9/20) due to elevated BUN and Scr, with concerns of an acute kidney injury. She has now been re-admitted to [facility] on 08/03/2023 for management of worsening depression in setting of auditory hallucinations. 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    HPI:  Patient reports she has been having worsening suicidal ideations in the setting of auditory hallucinations.  She continues to report hearing the voice of her younger sister, who passed away last 2022/08/14.  This has been a great loss for the patient, describes a very close relationship with her sister.  Her auditory hallucinations are characterized by hearing the voice of her sister asking patient to join her in death.  Patient's symptoms of depression are characterized by low mood, fatigue, diminished concentration, and feelings of hopelessness/worthlessness/guilt.  Patient had previously been discussing plan to change her Prozac to Effexor, had discussed this extensively with Dr. Renaldo Fiddler and is still interested in this change as she believes her current SSRI is not addressing her symptoms of depression.  Patient  also admits to polysubstance use, including smoking cigarettes daily and intranasal cocaine, as well as weekly cannabis use.  She admits that she has used cocaine and cannabis to address her insomnia, has had difficulty quitting.  Additional stressors she reports include homelessness,as she is currently on SSI and cannot afford to stay in a motel. She stays with her boyfriend of 4 years, [name], and reports she has a supportive relationship with him.  Denies any domestic violence.    Information obtained from initial admission on 07/31/2023 that is pertinent to day's interview.  "She reports past psychiatric history significant for depression, anxiety, PTSD, and substance abuse-cocaine and a history of alcohol abuse.  She reports history of 4 suicide attempts last cutting wrists in 2015-08-15.  She reports no history of self-injurious behavior.  She reports a history of 3 prior psychiatric hospitalizations-last [facility] 11/2015.  She reports past medical history significant for hypertension, diabetes, and neuropathy.  She reports past surgical history significant for right leg surgery and mole removal from her head.  She reports no history of head trauma.  She reports no history of seizures.  She reports an allergy to aspirin-hives.   She reports he currently is in a motel with her boyfriend.  She reports he is not currently employed.  She reports he graduated high school.  She reports she does not drink alcohol in 4 years.  She reports smoking half pack per day of cigarettes.  She reports smoking THC once a week and using cocaine daily 1 to 2 g.  She reports no current legal issues.  She reports no access to firearms.   Discussed with her if she felt like her Prozac was beneficial and she reports that  in the past it sometimes been helpful but also had not been helpful.  Discussed stopping this and replacing it with a different medication.  Discussed starting Effexor and Remeron.  Discussed potential risks and side  effects and she was agreeable to the trial.  She reports she is having significant withdrawals-sweats, shakes, nausea, diarrhea, body aches, and dizziness.  Discussed with her that we would start withdrawal as needed medications.  Also discussed reducing her torsemide for now as her blood pressure is soft and she is dizzy.  Encouraged her to drink fluids and eat is able to and she reported understanding.  She reports she still has SI with no intent/plan.  She reports no HI or VH.  She reports continuing to have AH.  She reports no other concerns present."  Psychiatric ROS Mood Symptoms Depressed mood, anhedonia, fatigue, feelings of worthlessness/guilt, hopelessness, anxiety, loss of energy/fatigue, Sleep -- improved with Remeron Appetite -- good  Manic Symptoms Denies any hx of manic episodes.   Anxiety Symptoms Reports feeling preocuppied with her stressors.   Trauma Symptoms Denies flashbacks or nightmares.   Psychosis Symptoms Denies any AH, report she last heard younger sister 1 hour ago, asking patient to "join her".     Past Psychiatric Hx: Depression, Anxiety, PTSD, and Substance Abuse (Cocaine, Hx of EtOH), and 4 Suicide Attempts (last- [facility] 2016) and 3 Prior Psychiatric Hospitalizations (last- [facility] 11/2015), and no history of Self Injurious Behavior.   Substance Abuse Hx: Alcohol: Denies any recent drinking , last drank 4-5 years. Reports prior alcohol use disorder, drank daily, describes severe withdrawal symptoms. Reports a pastor helped her become sober.  Tobacco: Smoke currently since age 63, currently smoking ~ 0.5 ppd. Interested in quitting.  Cannabis: Smokes 0.5 blunt of cannabis a week, purchased from dealer. Uses it for sleep, will smoke before bedtime.  Other Illicit drugs: Cocaine: Using for the past 5 years, uses 1 gram daily , route intrasal. Reports it helps with her chronic pain. Withdrawals characterized by body aches, headaches and insomnia.  Meth: No  current use, smoked it a few times back in 2019 Heroin: tried it once and an OD in 2018 Rx drug abuse: Rehab hx: Reports she went to [facility] years ago for her alcohol abuse.   Past Medical History: PCP: None currently.  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 L breast biposy.  Trauma: Denies Seizures: Denies any recent, she is unsure if she had withdrawal seizures 4-5 years ago.   LMP: Menopause for years now   Family Psychiatric  History: No Known Diagnosis', Substance Abuse, or Suicides   Social History: Living Situation: Lives in [place] with boyfriend ,[name],recently homeless. Has been with [name] for 4 years, describes it as a supportive relationship. Lost apartment after having surgery and recovering from it.  Education: Occupational hx: On SSI since 2019, makes around 914 monthly.  Marital Status: Single Children: Denies Legal: Denies Military: Denies  Access to firearms: Denies  Total Time spent with patient: 1.5 hours  Is the patient at risk to self? Yes.    Has the patient been a risk to self in the past 6 months? Yes.    Has the patient been a risk to self within the distant past? Yes.    Is the patient a risk to others? No.  Has the patient been a risk to  others in the past 6 months? No.  Has the patient been a risk to others within the distant past? No.   [place] Scale:  Flowsheet Row Admission (Current) [facility] Hosp-Admission (Discharged) from 08/01/2023 in [place] [facility] ED to Hosp-Admission (Discharged) from 07/30/2023 in [facility] Department [facility]  C-SSRS RISK CATEGORY No Risk High Risk No Risk        Tobacco Screening:  Social History   Tobacco Use  Smoking Status Every Day   Current  packs/day: 1.00   Average packs/day: 1 pack/day for 0.7 years (0.7 ttl pk-yrs)   Types: Cigarettes   Start date: 2024  Smokeless Tobacco Not on file    [facility] Tobacco Counseling     Are you interested in Tobacco Cessation Medications?  No, patient refused Counseled patient on smoking cessation:  Refused/Declined practical counseling Reason Tobacco Screening Not Completed: Patient Refused Screening       Social History:  Social History   Substance and Sexual Activity  Alcohol Use Not Currently     Social History   Substance and Sexual Activity  Drug Use Yes   Types: Marijuana, Cocaine   Comment: using cocaine ''    Additional Social History:                           Allergies:   Allergies  Allergen Reactions   Aspirin Rash and Other (See Comments)    high doses ago   Lab Results:  Results for orders placed or performed during the hospital encounter of 08/03/23 (from the past 48 hour(s))  Glucose, capillary     Status: Abnormal   Collection Time: 08/03/23  5:15 PM  Result Value Ref Range   Glucose-Capillary 185 (H) 70 - 99 mg/dL    Comment: Glucose reference range applies only to samples taken after fasting for at least 8 hours.  Glucose, capillary     Status: Abnormal   Collection Time: 08/03/23  9:06 PM  Result Value Ref Range   Glucose-Capillary 257 (H) 70 - 99 mg/dL    Comment: Glucose reference range applies only to samples taken after fasting for at least 8 hours.  Glucose, capillary     Status: Abnormal   Collection Time: 08/04/23  6:12 AM  Result Value Ref Range   Glucose-Capillary 167 (H) 70 - 99 mg/dL    Comment: Glucose reference range applies only to samples taken after fasting for at least 8 hours.  Glucose, capillary     Status: Abnormal   Collection Time: 08/04/23 11:42 AM  Result Value Ref Range   Glucose-Capillary 185 (H) 70 - 99 mg/dL    Comment: Glucose reference range applies only to samples taken after fasting for at least 8 hours.     Blood Alcohol level:  Lab Results  Component Value Date   ETH <10 07/30/2023   ETH <5 11/29/2015    Metabolic Disorder Labs:  Lab Results  Component Value Date   HGBA1C 11.0 (H) 07/30/2023   MPG 269 07/30/2023   MPG 171 02/02/2023   Lab Results  Component Value Date   PROLACTIN 10.4 07/30/2023   PROLACTIN 55.7 (H) 12/01/2015   Lab Results  Component Value Date   CHOL 175 07/30/2023   TRIG 181 (H) 07/30/2023   HDL 41 07/30/2023   CHOLHDL 4.3 07/30/2023   VLDL 36 07/30/2023   LDLCALC  98 07/30/2023   LDLCALC 49 02/02/2023    Current Medications: Current Facility-Administered Medications  Medication Dose Route Frequency Provider Last Rate Last Admin   acetaminophen (TYLENOL) tablet 650 mg  650 mg Oral Q6H PRN Maryagnes Amos, FNP   650 mg at 08/04/23 0820   alum & mag hydroxide-simeth (MAALOX/MYLANTA) 200-200-20 MG/5ML suspension 30 mL  30 mL Oral Q4H PRN Maryagnes Amos, FNP       amLODipine (NORVASC) tablet 10 mg  10 mg Oral Daily Maryagnes Amos, FNP   10 mg at 08/04/23 0816   ARIPiprazole (ABILIFY) tablet 5 mg  5 mg Oral Daily Maryagnes Amos, FNP   5 mg at 08/04/23 9604   aspirin EC tablet 81 mg  81 mg Oral Daily Maryagnes Amos, FNP   81 mg at 08/04/23 0816   atorvastatin (LIPITOR) tablet 80 mg  80 mg Oral Daily Maryagnes Amos, FNP   80 mg at 08/04/23 5409   diphenhydrAMINE (BENADRYL) capsule 50 mg  50 mg Oral TID PRN Maryagnes Amos, FNP       Or   diphenhydrAMINE (BENADRYL) injection 50 mg  50 mg Intramuscular TID PRN Starkes-Perry, Juel Burrow, FNP       haloperidol (HALDOL) tablet 5 mg  5 mg Oral TID PRN Maryagnes Amos, FNP       Or   haloperidol lactate (HALDOL) injection 5 mg  5 mg Intramuscular TID PRN Starkes-Perry, Juel Burrow, FNP       insulin aspart (novoLOG) injection 0-15 Units  0-15 Units Subcutaneous TID WC Nkwenti, Doris, NP   3 Units at 08/04/23 1144   LORazepam (ATIVAN) tablet 2 mg  2 mg Oral  TID PRN Maryagnes Amos, FNP       Or   LORazepam (ATIVAN) injection 2 mg  2 mg Intramuscular TID PRN Rosario Adie, Juel Burrow, FNP       losartan (COZAAR) tablet 100 mg  100 mg Oral Daily Carrion-Carrero, Zayleigh Stroh, MD   100 mg at 08/04/23 1146   magnesium hydroxide (MILK OF MAGNESIA) suspension 30 mL  30 mL Oral Daily PRN Maryagnes Amos, FNP       mirtazapine (REMERON) tablet 7.5 mg  7.5 mg Oral QHS PRN Bobbitt, Shalon E, NP   7.5 mg at 08/04/23 0202   montelukast (SINGULAIR) tablet 10 mg  10 mg Oral QHS Maryagnes Amos, FNP   10 mg at 08/03/23 2127   nicotine (NICODERM CQ - dosed in mg/24 hours) patch 14 mg  14 mg Transdermal Daily Massengill, Nathan, MD       nicotine polacrilex (NICORETTE) gum 2 mg  2 mg Oral PRN Massengill, Harrold Donath, MD       pantoprazole (PROTONIX) EC tablet 40 mg  40 mg Oral Daily Maryagnes Amos, FNP   40 mg at 08/04/23 0818   potassium chloride (KLOR-CON M) CR tablet 10 mEq  10 mEq Oral Daily Maryagnes Amos, FNP   10 mEq at 08/04/23 0818   pregabalin (LYRICA) capsule 100 mg  100 mg Oral BID Maryagnes Amos, FNP   100 mg at 08/04/23 0820   thiamine (Vitamin B-1) tablet 100 mg  100 mg Oral BID Maryagnes Amos, FNP   100 mg at 08/04/23 0820   [START ON 08/05/2023] venlafaxine XR (EFFEXOR-XR) 24 hr capsule 37.5 mg  37.5 mg Oral Q breakfast Carrion-Carrero, Karle Starch, MD       PTA Medications: Medications Prior to Admission  Medication Sig  Dispense Refill Last Dose   albuterol (VENTOLIN HFA) 108 (90 Base) MCG/ACT inhaler Inhale 2 puffs into the lungs every 4 (four) hours as needed for wheezing or shortness of breath.      amLODipine (NORVASC) 10 MG tablet Take 10 mg by mouth daily.      ARIPiprazole (ABILIFY) 5 MG tablet Take 5 mg by mouth daily.      aspirin EC 81 MG tablet Take 81 mg by mouth daily. Swallow whole.      atorvastatin (LIPITOR) 80 MG tablet Take 80 mg by mouth daily.      BYDUREON BCISE 2 MG/0.85ML AUIJ Inject 2 mg  into the skin every Tuesday.      cyanocobalamin (VITAMIN B12) 500 MCG tablet Take 1 tablet (500 mcg total) by mouth daily. 30 tablet 0    diphenhydramine-acetaminophen (TYLENOL PM) 25-500 MG TABS tablet Take 1 tablet by mouth at bedtime as needed (For sleep or pain).      FLUoxetine (PROZAC) 40 MG capsule Take 40 mg by mouth daily.      fluticasone (FLONASE) 50 MCG/ACT nasal spray Place 1 spray into both nostrils daily as needed for allergies or rhinitis.      folic acid (FOLVITE) 1 MG tablet Take 1 tablet (1 mg total) by mouth daily. 30 tablet 0    LANTUS SOLOSTAR 100 UNIT/ML Solostar Pen Inject 28 Units into the skin at bedtime.      losartan (COZAAR) 100 MG tablet Take 100 mg by mouth daily.      montelukast (SINGULAIR) 10 MG tablet Take 10 mg by mouth at bedtime.      naphazoline-glycerin (CLEAR EYES REDNESS) 0.012-0.25 % SOLN Place 2 drops into both eyes 4 (four) times daily as needed (For allergies).      [START ON 08/05/2023] potassium chloride (KLOR-CON M) 10 MEQ tablet Take 1 tablet (10 mEq total) by mouth daily.      pregabalin (LYRICA) 100 MG capsule Take 100 mg by mouth 2 (two) times daily.      SYMBICORT 160-4.5 MCG/ACT inhaler Inhale 2 puffs into the lungs 2 (two) times daily.      thiamine (VITAMIN B1) 100 MG tablet Take 100 mg by mouth 2 (two) times daily.      [START ON 08/05/2023] torsemide (DEMADEX) 20 MG tablet Take 2 tablets (40 mg total) by mouth 2 (two) times daily.       Musculoskeletal: Strength & Muscle Tone:  unable to formally assess Gait & Station: normal Patient leans: N/A  Psychiatric Specialty Exam:  Presentation  General Appearance:  Appropriate for Environment  Eye Contact: Good  Speech: Clear and Coherent; Normal Rate  Speech Volume: Normal  Handedness: Not assessed   Mood and Affect  Mood: "depressed"  Affect: Congruent; Depressed   Thought Process  Thought Processes: Coherent; Goal Directed  Descriptions of  Associations: Intact  Orientation: Not formally assessed  Thought Content: Logical; WDL  History of Schizophrenia/Schizoaffective disorder: No  Duration of Psychotic Symptoms:N/A Hallucinations: Auditory hallucinations, described as command hallucinations hearing the voice of sister requesting the patient join her in death Ideas of Reference: Denies  Suicidal Thoughts: Passive suicidal ideations, without intent or plan, contracting for safety Homicidal Thoughts: Denies  Sensorium  Memory: Immediate Fair; Recent Fair  Judgment: Fair  Insight: Fair   Chartered certified accountant: Fair  Attention Span: Fair  Recall: [employer] of Knowledge: Fair  Language: Good   Psychomotor Activity  Psychomotor Activity: normal  Assets  Assets:  Communication Skills; Desire for Improvement; Financial Resources/Insurance; Resilience   Sleep  Sleep:Good   Physical Exam: Physical Exam Constitutional:      Appearance: She is morbidly obese. She is not ill-appearing or toxic-appearing.  HENT:     Head: Normocephalic and atraumatic.  Pulmonary:     Comments: Increased respiratory effort when ambulating, otherwise in no distress Skin:    General: Skin is warm and dry.    Review of Systems  Constitutional:  Negative for chills and fever.  Gastrointestinal:  Negative for abdominal pain, constipation and diarrhea.  Neurological:  Negative for dizziness and headaches.  Psychiatric/Behavioral:  Positive for depression, hallucinations and suicidal ideas. Negative for memory loss and substance abuse. The patient is nervous/anxious. The patient does not have insomnia.    Blood pressure (!) 147/93, pulse 89, temperature 98.3 F (36.8 C), temperature source Oral, resp. rate 20, height 5\' 3"  (1.6 m), weight (!) 139 kg, last menstrual period 07/21/2015, SpO2 99%. Body mass index is 54.28 kg/m.   Treatment Plan Summary: Daily contact with patient to assess and  evaluate symptoms and progress in treatment and Medication management   ASSESSMENT: Hailey Klein is a 57 yr old female who presented on 9/16 to [facility] with worsening depression and SI in the setting of AH, she was admitted to [facility] on 9/17.  PPHx is significant for Depression, Anxiety, PTSD, and Substance Abuse (Cocaine, Hx of EtOH), and 4 Suicide Attempts (last- [facility] 2016) and 3 Prior Psychiatric Hospitalizations (last- [facility] 11/2015), and no history of Self Injurious Behavior.    Diagnoses / Active Problems: MDD recurrent severe with psychosis GAD PTSD   PLAN: Safety and Monitoring:  --  VOLUNTARY  admission to inpatient psychiatric unit for safety, stabilization and treatment  -- Daily contact with patient to assess and evaluate symptoms and progress in treatment  -- Patient's case to be discussed in multi-disciplinary team meeting  -- Observation Level : q15 minute checks  -- Vital signs:  q12 hours  -- Precautions: suicide, elopement, and assault  2. Psychiatric Diagnoses and Treatment:  Starting Effexor 37.5 mg daily on 9/22 D/c prozac today Continue Remeron 7.5 mg nightly -- The risks/benefits/side-effects/alternatives to this medication were discussed in detail with the patient and time was given for questions. The patient consents to medication trial.              -- Metabolic profile and EKG monitoring obtained while on an atypical antipsychotic  BMI: 54.28 kg/m2  Lipid Panel: WNL except Trig: 181, TSH: 5.182  BMP showing serum creatinine 1.5 downtrending, GFR 41, BUN 51, hyperglycemic 186.  CBC hemoglobin 9.8,             -- Encouraged patient to participate in unit milieu and in scheduled group therapies   -- Short Term Goals: Ability to identify changes in lifestyle to reduce recurrence of condition will improve and Ability to verbalize feelings will improve  -- Long Term Goals: Improvement in symptoms so as ready for discharge Other PRNS: tylenol, Maalox/Mylanta,  milk of magnesia    3. 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 Monitor BMPs twice weekly, follow-up on repeat on 08/06/2023 Will require PCP follow-up appointment at discharge with nephrology consultation.  #Essential hypertension  Continue Losartan 100 mg daily  Continue Amlodipine 10 mg daily  #  T2DM (A1C 11%, uncontrolled) Per medical team: if CBGs are consistently >180-200, consider NovoLog SSI Hold metformin due to CKD  #Diabetic polyneuropathy associated with type 2 diabetes mellitus  Continue pregabalin 100 mg PO BID  #Normocytic anemia  Per hospitalist team, continue B12 and folate supplements.  No need to monitor while at [facility].   #Morbid Obesity Per medical team, lifestyle modification and weight loss as outpatient  4. Discharge Planning:   -- Social work and case management to assist with discharge planning and identification of hospital follow-up needs prior to discharge  -- Estimated LOS: 5-7 days  -- Discharge Concerns: Need to establish a safety plan; Medication compliance and effectiveness  -- Discharge Goals: Return home with outpatient referrals for mental health follow-up including medication management/psychotherapy   I certify that inpatient services furnished can reasonably be expected to improve the patient's condition.   This note was created using a voice recognition software as a result there may be grammatical errors inadvertently enclosed that do not reflect the nature of this encounter. Every attempt is made to correct such errors.   Signed: Dr. Liston Alba, MD PGY-2, Psychiatry Residency  9/21/202412:09 PM
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
## 2023-08-04 NOTE — Progress Notes (Signed)
   08/04/23 1200  Psych Admission Type (Psych Patients Only)  Admission Status Voluntary  Psychosocial Assessment  Patient Complaints Depression  Eye Contact Fair  Facial Expression Animated  Affect Appropriate to circumstance;Depressed  Speech Logical/coherent  Interaction Assertive  Motor Activity Slow  Appearance/Hygiene Body odor;Poor hygiene;In hospital gown;Other (Comment) (pt given clean clothes and towels  encouraged to take a shower.)  Behavior Characteristics Cooperative;Appropriate to situation  Mood Depressed  Aggressive Behavior  Effect No apparent injury  Thought Process  Coherency WDL  Content WDL  Delusions None reported or observed  Perception Hallucinations  Hallucination Auditory  Judgment Impaired  Confusion None  Danger to Self  Current suicidal ideation? Denies  Self-Injurious Behavior No self-injurious ideation or behavior indicators observed or expressed   Agreement Not to Harm Self Yes  Description of Agreement verbal  Danger to Others  Danger to Others None reported or observed

## 2023-08-04 NOTE — Progress Notes (Signed)
   08/04/23 0600  15 Minute Checks  Location Bedroom  Visual Appearance Calm  Behavior Sleeping  Sleep (Behavioral Health Patients Only)  Calculate sleep? (Click Yes once per 24 hr at 0600 safety check) Yes  Documented sleep last 24 hours 6.75

## 2023-08-04 NOTE — [facility] Suicide Risk Assessment (Signed)
Suicide Risk Assessment  Admission Assessment    [facility] Admission Suicide Risk Assessment   Nursing information obtained from:  Patient, Review of record Demographic factors:  Low socioeconomic status Current Mental Status:  Suicidal ideation indicated by patient Loss Factors:  Decline in physical health Historical Factors:  Prior suicide attempts, Impulsivity Risk Reduction Factors:  Positive social support  Total Time spent with patient: 1.5 hours Principal Problem: MDD (major depressive disorder), recurrent episode, severe (HCC) Diagnosis:  Principal Problem:   MDD (major depressive disorder), recurrent episode, severe (HCC)   Subjective Data:   Hailey Klein is a 57 y.o. female  with a past psychiatric history of depression, anxiety, PTSD, and substance abuse (Cocaine, Hx of EtOH), and 4 Suicide Attempts (last- [facility] August 26, 2015) and 3 Prior Psychiatric Hospitalizations. Patient was recently discharged from [facility] on 9/18 and transferred to [facility], admitted to the medical floor (9/18-9/20) due to elevated BUN and Scr, with concerns of an acute kidney injury. She has now been re-admitted to [facility] on 08/03/2023 for management of worsening depression in setting of auditory hallucinations. 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      HPI:  Patient reports she has been having worsening suicidal ideations in the setting of auditory hallucinations.  She continues to report hearing the voice of her younger sister, who passed away last August 25, 2022.  This has been a great loss for the patient, describes a very close relationship with her sister.  Her auditory hallucinations are characterized by hearing the voice of her sister asking patient to join her in death.  Patient's symptoms of depression are characterized by low mood, fatigue, diminished concentration, and feelings of hopelessness/worthlessness/guilt.  Patient had previously been  discussing plan to change her Prozac to Effexor, had discussed this extensively with Dr. Renaldo Fiddler and is still interested in this change as she believes her current SSRI is not addressing her symptoms of depression.  Patient also admits to polysubstance use, including smoking cigarettes daily and intranasal cocaine, as well as weekly cannabis use.  She admits that she has used cocaine and cannabis to address her insomnia, has had difficulty quitting.   Additional stressors she reports include homelessness,as she is currently on SSI and cannot afford to stay in a motel. She stays with her boyfriend of 4 years, Hailey Klein, and reports she has a supportive relationship with him.  Denies any domestic violence.     Information obtained from initial admission on 07/31/2023 that is pertinent to day's interview.  "She reports past psychiatric history significant for depression, anxiety, PTSD, and substance abuse-cocaine and a history of alcohol abuse.  She reports history of 4 suicide attempts last cutting wrists in August 26, 2015.  She reports no history of self-injurious behavior.  She reports a history of 3 prior psychiatric hospitalizations-last [facility] 11/2015.  She reports past medical history significant for hypertension, diabetes, and neuropathy.  She reports past surgical history significant for right leg surgery and mole removal from her head.  She reports no history of head trauma.  She reports no history of seizures.  She reports an allergy to aspirin-hives.   She reports he currently is in a motel with her boyfriend.  She reports he is not currently employed.  She reports he graduated high school.  She reports she does not drink alcohol in 4 years.  She reports smoking half pack per day of cigarettes.  She reports smoking THC once a  week and using cocaine daily 1 to 2 g.  She reports no current legal issues.  She reports no access to firearms.   Discussed with her if she felt like her Prozac was beneficial and she  reports that in the past it sometimes been helpful but also had not been helpful.  Discussed stopping this and replacing it with a different medication.  Discussed starting Effexor and Remeron.  Discussed potential risks and side effects and she was agreeable to the trial.  She reports she is having significant withdrawals-sweats, shakes, nausea, diarrhea, body aches, and dizziness.  Discussed with her that we would start withdrawal as needed medications.  Also discussed reducing her torsemide for now as her blood pressure is soft and she is dizzy.  Encouraged her to drink fluids and eat is able to and she reported understanding.  She reports she still has SI with no intent/plan.  She reports no HI or VH.  She reports continuing to have AH.  She reports no other concerns present."  Psychiatric ROS Mood Symptoms Depressed mood, anhedonia, fatigue, feelings of worthlessness/guilt, hopelessness, anxiety, loss of energy/fatigue, Sleep -- improved with Remeron Appetite -- good   Manic Symptoms Denies any hx of manic episodes.    Anxiety Symptoms Reports feeling preocuppied with her stressors.    Trauma Symptoms Denies flashbacks or nightmares.    Psychosis Symptoms Denies any AH, report she last heard younger sister 1 hour ago, asking patient to "join her".       Past Psychiatric Hx: Depression, Anxiety, PTSD, and Substance Abuse (Cocaine, Hx of EtOH), and 4 Suicide Attempts (last- [facility] 2016) and 3 Prior Psychiatric Hospitalizations (last- [facility] 11/2015), and no history of Self Injurious Behavior.    Substance Abuse Hx: Alcohol: Denies any recent drinking , last drank 4-5 years. Reports prior alcohol use disorder, drank daily, describes severe withdrawal symptoms. Reports a pastor helped her become sober.  Tobacco: Smoke currently since age 68, currently smoking ~ 0.5 ppd. Interested in quitting.  Cannabis: Smokes 0.5 blunt of cannabis a week, purchased from dealer. Uses it for sleep, will  smoke before bedtime.  Other Illicit drugs: Cocaine: Using for the past 5 years, uses 1 gram daily , route intrasal. Reports it helps with her chronic pain. Withdrawals characterized by body aches, headaches and insomnia.  Meth: No current use, smoked it a few times back in 2019 Heroin: tried it once and an OD in 2018 Rx drug abuse: Rehab hx: Reports she went to [facility] years ago for her alcohol abuse.    Past Medical History: PCP: None currently.  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 L breast biposy.  Trauma: Denies Seizures: Denies any recent, she is unsure if she had withdrawal seizures 4-5 years ago.    LMP: Menopause for years now     Family Psychiatric  History: No Known Diagnosis', Substance Abuse, or Suicides    Social History: Living Situation: Lives in [place] with boyfriend ,Hailey Klein,recently homeless. Has been with Hailey Klein for 4 years, describes it as a supportive relationship. Lost apartment after having surgery and recovering from it.  Education: Occupational hx: On SSI since 2019, makes around 914 monthly.  Marital Status: Single Children: Denies Legal: Denies Military: Denies   Access to firearms: Denies  Continued Clinical Symptoms:  Alcohol Use Disorder Identification Test Final Score (AUDIT): 0 The "Alcohol  Use Disorders Identification Test", Guidelines for Use in Primary Care, Second Edition.  World Science writer [facility]). Score between 0-7:  no or low risk or alcohol related problems. Score between 8-15:  moderate risk of alcohol related problems. Score between 16-19:  high risk of alcohol related problems. Score 20 or above:  warrants further diagnostic evaluation for alcohol dependence and treatment.   CLINICAL FACTORS:   Depression:   Comorbid alcohol  abuse/dependence Hopelessness Previous Psychiatric Diagnoses and Treatments  Musculoskeletal: Strength & Muscle Tone:  unable to formally assess Gait & Station: normal Patient leans: N/A   Psychiatric Specialty Exam:   Presentation  General Appearance:  Appropriate for Environment   Eye Contact: Good   Speech: Clear and Coherent; Normal Rate   Speech Volume: Normal   Handedness: Not assessed     Mood and Affect  Mood: "depressed"   Affect: Congruent; Depressed     Thought Process  Thought Processes: Coherent; Goal Directed   Descriptions of Associations: Intact   Orientation: Not formally assessed   Thought Content: Logical; WDL   History of Schizophrenia/Schizoaffective disorder: No   Duration of Psychotic Symptoms:N/A Hallucinations: Auditory hallucinations, described as command hallucinations hearing the voice of sister requesting the patient join her in death Ideas of Reference: Denies   Suicidal Thoughts: Passive suicidal ideations, without intent or plan, contracting for safety Homicidal Thoughts: Denies   Sensorium  Memory: Immediate Fair; Recent Fair   Judgment: Fair   Insight: Fair     Chartered certified accountant: Fair   Attention Span: Fair   Recall: [employer] of Knowledge: Fair   Language: Good     Psychomotor Activity  Psychomotor Activity: normal   Assets  Assets: Communication Skills; Desire for Improvement; Financial Resources/Insurance; Resilience     Sleep  Sleep:Good     Physical Exam: Physical Exam Constitutional:      Appearance: She is morbidly obese. She is not ill-appearing or toxic-appearing.  HENT:     Head: Normocephalic and atraumatic.  Pulmonary:     Comments: Increased respiratory effort when ambulating, otherwise in no distress Skin:    General: Skin is warm and dry.      Review of Systems  Constitutional:  Negative for chills and fever.  Gastrointestinal:  Negative  for abdominal pain, constipation and diarrhea.  Neurological:  Negative for dizziness and headaches.  Psychiatric/Behavioral:  Positive for depression, hallucinations and suicidal ideas. Negative for memory loss and substance abuse. The patient is nervous/anxious. The patient does not have insomnia.     Blood pressure (!) 147/93, pulse 89, temperature 98.3 F (36.8 C), temperature source Oral, resp. rate 20, height 5\' 3"  (1.6 m), weight (!) 139 kg, last menstrual period 07/21/2015, SpO2 99%. Body mass index is 54.28 kg/m.   COGNITIVE FEATURES THAT CONTRIBUTE TO RISK:  None    SUICIDE RISK:   Moderate:  Frequent suicidal ideation with limited intensity, and duration, some specificity in terms of plans, no associated intent, good self-control, limited dysphoria/symptomatology, some risk factors present, and identifiable protective factors, including available and accessible social support.  PLAN OF CARE: See H&P for assessment and plan.   I certify that inpatient services furnished can reasonably be expected to improve the patient's condition.   Lorri Frederick, MD 08/04/2023, 8:41 AM

## 2023-08-05 LAB — GLUCOSE, CAPILLARY
Glucose-Capillary: 152 mg/dL — ABNORMAL HIGH (ref 70–99)
Glucose-Capillary: 226 mg/dL — ABNORMAL HIGH (ref 70–99)
Glucose-Capillary: 251 mg/dL — ABNORMAL HIGH (ref 70–99)
Glucose-Capillary: 293 mg/dL — ABNORMAL HIGH (ref 70–99)

## 2023-08-05 MED ORDER — TORSEMIDE 20 MG PO TABS
20.0000 mg | ORAL_TABLET | Freq: Every day | ORAL | Status: DC
  Administered 2023-08-05 – 2023-08-10 (×6): 20 mg via ORAL
  Filled 2023-08-05 (×7): qty 1

## 2023-08-05 MED ORDER — POLYETHYLENE GLYCOL 3350 17 G PO PACK
17.0000 g | PACK | Freq: Every day | ORAL | Status: DC | PRN
  Filled 2023-08-05: qty 1

## 2023-08-05 MED ORDER — BOOST / RESOURCE BREEZE PO LIQD CUSTOM
1.0000 | Freq: Three times a day (TID) | ORAL | Status: DC
  Administered 2023-08-05: 237 mL via ORAL
  Administered 2023-08-05 – 2023-08-06 (×3): 1 via ORAL
  Filled 2023-08-05 (×10): qty 1

## 2023-08-05 NOTE — Progress Notes (Signed)
   08/05/23 1535  Psych Admission Type (Psych Patients Only)  Admission Status Voluntary  Psychosocial Assessment  Patient Complaints Depression  Eye Contact Fair  Facial Expression Animated  Affect Appropriate to circumstance;Depressed  Speech Logical/coherent  Interaction Assertive  Motor Activity Slow  Appearance/Hygiene Body odor;Poor hygiene;In hospital gown;Other (Comment) (pt given clean clothes and towels  encouraged to take a shower.)  Behavior Characteristics Cooperative  Mood Depressed  Aggressive Behavior  Effect No apparent injury  Thought Process  Coherency WDL  Content WDL  Delusions None reported or observed  Perception Hallucinations  Hallucination Auditory  Judgment Impaired  Confusion None  Danger to Self  Current suicidal ideation? Denies  Self-Injurious Behavior No self-injurious ideation or behavior indicators observed or expressed   Agreement Not to Harm Self Yes  Description of Agreement verbal  Danger to Others  Danger to Others None reported or observed

## 2023-08-05 NOTE — Plan of Care (Signed)
  Problem: Education: Goal: Knowledge of [facility] Education information/materials will improve Outcome: Progressing Goal: Emotional status will improve Outcome: Progressing Goal: Mental status will improve Outcome: Progressing Goal: Verbalization of understanding the information provided will improve Outcome: Progressing   

## 2023-08-05 NOTE — [facility] Group Notes (Signed)
[facility] Group Notes:  (Nursing/MHT/Case Management/Adjunct)  Date:  08/05/2023  Time:  9:31 AM  Type of Therapy:   Goals group  Participation Level:  Active  Participation Quality:  Appropriate  Affect:  Appropriate  Cognitive:  Appropriate  Insight:  Appropriate  Engagement in Group:  Engaged  Modes of Intervention:  Discussion, Orientation, and Socialization  Summary of Progress/Problems: Goal is to attend the group  [facility] 08/05/2023, 9:31 AM

## 2023-08-05 NOTE — [facility] Group Notes (Signed)
[facility] Group Notes:  (Nursing/MHT/Case Management/Adjunct)  Date:  08/05/2023  Time:  2015  Type of Therapy:   wrap up group  Participation Level:  Active  Participation Quality:  Appropriate, Attentive, Sharing, and Supportive  Affect:  Depressed  Cognitive:  Alert  Insight:  Improving  Engagement in Group:  Engaged  Modes of Intervention:  Clarification, Education, and Support  Summary of Progress/Problems: Positive thinking and positive change were discussed.   Marcille Buffy 08/05/2023, 9:53 PM

## 2023-08-05 NOTE — Progress Notes (Signed)
   08/05/23 0528  15 Minute Checks  Location Bedroom  Visual Appearance Calm  Behavior Sleeping  Sleep (Behavioral Health Patients Only)  Calculate sleep? (Click Yes once per 24 hr at 0600 safety check) Yes  Documented sleep last 24 hours 8.75

## 2023-08-05 NOTE — Progress Notes (Addendum)
[facility] MD Progress Note  08/05/2023 7:22 AM [name]  MRN:  [id number]  Principal Problem: MDD (major depressive disorder), recurrent episode, severe (HCC) Diagnosis: Principal Problem:   MDD (major depressive disorder), recurrent episode, severe (HCC)   Reason for Admission:  Hailey Klein is a 57 y.o. female  with a past psychiatric history of depression, anxiety, PTSD, and substance abuse (Cocaine, Hx of EtOH), and 4 Suicide Attempts (last- [facility] 2016) and 3 Prior Psychiatric Hospitalizations. Patient was recently discharged from [facility] on 9/18 and transferred to [facility], admitted to the medical floor (9/18-9/20) due to elevated BUN and Scr, with concerns of an acute kidney injury. She has now been re-admitted to [facility] on 08/03/2023 for management of worsening depression in setting of auditory hallucinations. 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  (admitted on 08/03/2023, total  LOS: 2 days )  Yesterday, the psychiatry team made following recommendations:  Starting Effexor 37.5 mg daily on 9/22 D/c prozac today Continue Remeron 7.5 mg nightly  Pertinent information discussed during bed progression: Patient has been malodorous, working on improving hygiene today.   PRNs required overnight:  Tylenol 1x Remeron 1x   Information Obtained Today During Patient Interview:  Patient evaluated on the unit. Reports sleep is good, reports it was restful. Reports appetite is also good. States mood is "I don't know" today. Reports yesterday she slept most of the day. Reports she participated in group activities,  Reports depression is better compared to yesterday, rates it a 6/10 (yesterday it was higher).   Reports goals for today include "to attend more groups".   On interview, suicidal ideations are not present . Homicidal ideations are not present.   Reports she last had AH last night, has not heard sister's voice this  morning. There are no visual hallucinations, paranoid ideations, or delusional thought processes.   Side effects to currently prescribed medications are none. Reporting constipation, amenable to starting miralax . Patient reporting soreness on feet bilaterally, which she reports is chronic. She is also reporting malodorous urine that began yesterday, denies any burning/itching when going to bathroom. Denies any vaginal discharge. Denies fevers/chills. Encouraged aptient to improve personal hygiene, reassured we would continue monitoring for any worsening sxs.    Past Psychiatric Hx: Depression, Anxiety, PTSD, and Substance Abuse (Cocaine, Hx of EtOH), and 4 Suicide Attempts (last- [facility]) and 3 Prior Psychiatric Hospitalizations (last- [facility] 11/2015), and no history of Self Injurious Behavior.    Substance Abuse Hx: Alcohol: Denies any recent drinking , last drank 4-5 years. Reports prior alcohol use disorder, drank daily, describes severe withdrawal symptoms. Reports a pastor helped her become sober.  Tobacco: Smoke currently since age 26, currently smoking ~ 0.5 ppd. Interested in quitting.  Cannabis: Smokes 0.5 blunt of cannabis a week, purchased from dealer. Uses it for sleep, will smoke before bedtime.  Other Illicit drugs: Cocaine: Using for the past 5 years, uses 1 gram daily , route intrasal. Reports it helps with her chronic pain. Withdrawals characterized by body aches, headaches and insomnia.  Meth: No current use, smoked it a few times back in 2019 Heroin: tried it once and an OD in 2018 Rx drug abuse: Rehab hx: Reports she went to [facility] years ago for her alcohol abuse.    Past Medical History: PCP: None currently.  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 L breast biposy.  Trauma: Denies Seizures: Denies  any recent, she is unsure if she had withdrawal seizures 4-5 years ago.    LMP: Menopause for years now     Family Psychiatric  History: No Known Diagnosis', Substance Abuse, or Suicides    Social History: Living Situation: Lives in [place] with boyfriend ,[name],recently homeless. Has been with [name] for 4 years, describes it as a supportive relationship. Lost apartment after having surgery and recovering from it.  Education: Occupational hx: On SSI since 2019, makes around 914 monthly.  Marital Status: Single Children: Denies Legal: Denies Military: Denies   Access to firearms: Denies Past Medical History:  Past Medical History:  Diagnosis Date   Chronic kidney disease    Diabetes mellitus without complication (HCC)    Dyspnea    H/O suicide attempt    cut wrists - 19-y-o   High cholesterol    Hypertension    Neuropathy    PTSD (post-traumatic stress disorder)    Family History:  Family History  Problem Relation Age of Onset   Alcoholism Other     Current Medications: Current Facility-Administered Medications  Medication Dose Route Frequency Provider Last Rate Last Admin   acetaminophen (TYLENOL) tablet 650 mg  650 mg Oral Q6H PRN Maryagnes Amos, FNP   650 mg at 08/04/23 2120   alum & mag hydroxide-simeth (MAALOX/MYLANTA) 200-200-20 MG/5ML suspension 30 mL  30 mL Oral Q4H PRN Maryagnes Amos, FNP       amLODipine (NORVASC) tablet 10 mg  10 mg Oral Daily Maryagnes Amos, FNP   10 mg at 08/04/23 0816   ARIPiprazole (ABILIFY) tablet 5 mg  5 mg Oral Daily Maryagnes Amos, FNP   5 mg at 08/04/23 6962   aspirin EC tablet 81 mg  81 mg Oral Daily Maryagnes Amos, FNP   81 mg at 08/04/23 0816   atorvastatin (LIPITOR) tablet 80 mg  80 mg Oral Daily Maryagnes Amos, FNP   80 mg at 08/04/23 9528   diphenhydrAMINE (BENADRYL) capsule 50 mg  50 mg Oral TID PRN Maryagnes Amos, FNP       Or   diphenhydrAMINE (BENADRYL) injection 50 mg   50 mg Intramuscular TID PRN Starkes-Perry, Juel Burrow, FNP       folic acid (FOLVITE) tablet 1 mg  1 mg Oral Daily Carrion-Carrero, Yonael Tulloch, MD   1 mg at 08/04/23 1648   haloperidol (HALDOL) tablet 5 mg  5 mg Oral TID PRN Maryagnes Amos, FNP       Or   haloperidol lactate (HALDOL) injection 5 mg  5 mg Intramuscular TID PRN Starkes-Perry, Juel Burrow, FNP       insulin aspart (novoLOG) injection 0-15 Units  0-15 Units Subcutaneous TID WC Nkwenti, Doris, NP   3 Units at 08/05/23 0602   LORazepam (ATIVAN) tablet 2 mg  2 mg Oral TID PRN Maryagnes Amos, FNP       Or   LORazepam (ATIVAN) injection 2 mg  2 mg Intramuscular TID PRN Maryagnes Amos, FNP       losartan (COZAAR) tablet 100 mg  100 mg Oral Daily Carrion-Carrero, Janira Mandell, MD   100 mg at 08/04/23 1146   magnesium hydroxide (MILK OF MAGNESIA) suspension 30 mL  30 mL Oral Daily PRN Maryagnes Amos, FNP       mirtazapine (REMERON) tablet 7.5 mg  7.5 mg Oral QHS  PRN Bobbitt, Ysidro Evert E, NP   7.5 mg at 08/04/23 2119   montelukast (SINGULAIR) tablet 10 mg  10 mg Oral QHS Maryagnes Amos, FNP   10 mg at 08/04/23 2120   nicotine (NICODERM CQ - dosed in mg/24 hours) patch 14 mg  14 mg Transdermal Daily Massengill, Nathan, MD       nicotine polacrilex (NICORETTE) gum 2 mg  2 mg Oral PRN Massengill, Harrold Donath, MD       pantoprazole (PROTONIX) EC tablet 40 mg  40 mg Oral Daily Maryagnes Amos, FNP   40 mg at 08/04/23 0818   potassium chloride (KLOR-CON M) CR tablet 10 mEq  10 mEq Oral Daily Maryagnes Amos, FNP   10 mEq at 08/04/23 0818   pregabalin (LYRICA) capsule 100 mg  100 mg Oral BID Maryagnes Amos, FNP   100 mg at 08/04/23 1652   thiamine (Vitamin B-1) tablet 100 mg  100 mg Oral BID Maryagnes Amos, FNP   100 mg at 08/04/23 1652   venlafaxine XR (EFFEXOR-XR) 24 hr capsule 37.5 mg  37.5 mg Oral Q breakfast Carrion-Carrero, Karle Starch, MD        Lab Results:  Results for orders placed or performed  during the hospital encounter of 08/03/23 (from the past 48 hour(s))  Glucose, capillary     Status: Abnormal   Collection Time: 08/03/23  5:15 PM  Result Value Ref Range   Glucose-Capillary 185 (H) 70 - 99 mg/dL    Comment: Glucose reference range applies only to samples taken after fasting for at least 8 hours.  Glucose, capillary     Status: Abnormal   Collection Time: 08/03/23  9:06 PM  Result Value Ref Range   Glucose-Capillary 257 (H) 70 - 99 mg/dL    Comment: Glucose reference range applies only to samples taken after fasting for at least 8 hours.  Glucose, capillary     Status: Abnormal   Collection Time: 08/04/23  6:12 AM  Result Value Ref Range   Glucose-Capillary 167 (H) 70 - 99 mg/dL    Comment: Glucose reference range applies only to samples taken after fasting for at least 8 hours.  Glucose, capillary     Status: Abnormal   Collection Time: 08/04/23 11:42 AM  Result Value Ref Range   Glucose-Capillary 185 (H) 70 - 99 mg/dL    Comment: Glucose reference range applies only to samples taken after fasting for at least 8 hours.  Glucose, capillary     Status: Abnormal   Collection Time: 08/04/23  4:47 PM  Result Value Ref Range   Glucose-Capillary 231 (H) 70 - 99 mg/dL    Comment: Glucose reference range applies only to samples taken after fasting for at least 8 hours.  Glucose, capillary     Status: Abnormal   Collection Time: 08/04/23  7:49 PM  Result Value Ref Range   Glucose-Capillary 223 (H) 70 - 99 mg/dL    Comment: Glucose reference range applies only to samples taken after fasting for at least 8 hours.  Glucose, capillary     Status: Abnormal   Collection Time: 08/05/23  5:34 AM  Result Value Ref Range   Glucose-Capillary 152 (H) 70 - 99 mg/dL    Comment: Glucose reference range applies only to samples taken after fasting for at least 8 hours.    Blood Alcohol level:  Lab Results  Component Value Date   ETH <10 07/30/2023   ETH <5 11/29/2015    Metabolic  Labs: Lab Results  Component Value Date   HGBA1C 11.0 (H) 07/30/2023   MPG 269 07/30/2023   MPG 171 02/02/2023   Lab Results  Component Value Date   PROLACTIN 10.4 07/30/2023   PROLACTIN 55.7 (H) 12/01/2015   Lab Results  Component Value Date   CHOL 175 07/30/2023   TRIG 181 (H) 07/30/2023   HDL 41 07/30/2023   CHOLHDL 4.3 07/30/2023   VLDL 36 07/30/2023   LDLCALC 98 07/30/2023   LDLCALC 49 02/02/2023    Sleep:No data recorded  Physical Findings: AIMS: No  CIWA:    COWS:     Psychiatric Specialty Exam:  Presentation  General Appearance: Poorly groomed  Eye Contact:Good  Speech:Clear and Coherent; Normal Rate  Speech Volume:Normal  Handedness:Not assessed   Mood and Affect  Mood:"just hoping I get better"  Affect:Remains depressed, mood reactivity improved   Thought Process  Thought Processes:Coherent; Goal Directed  Descriptions of Associations:Intact  Orientation:Not assessed  Thought Content:Logical; WDL  History of Schizophrenia/Schizoaffective disorder:No  Duration of Psychotic Symptoms:N/A  Hallucinations:Denies Ideas of Reference:None  Suicidal Thoughts:Denies Homicidal Thoughts:Denies  Sensorium  Memory:Immediate Fair; Recent Fair  Judgment:Fair  Insight:Fair   Executive Functions  Concentration:Fair  Attention Span:Fair  Recall:Fair  Fund of Knowledge:Fair  Language:Good   Psychomotor Activity  Psychomotor Activity:Normal  Assets  Assets:Communication Skills; Desire for Improvement; Financial Resources/Insurance; Resilience   Sleep  Sleep:Good   Physical Exam: Physical Exam Review of Systems  Constitutional: Negative.   Eyes: Negative.   Cardiovascular: Negative.   Gastrointestinal:  Positive for constipation.  Genitourinary:  Negative for dysuria and urgency.  All other systems reviewed and are negative.   Constitutional:      Appearance: She is morbidly obese. She is not ill-appearing or  toxic-appearing.  HENT:     Head: Normocephalic and atraumatic.  Pulmonary:     Comments: Increased respiratory effort when ambulating, otherwise in no distress Skin:    General: Skin is warm and dry.     Blood pressure 138/63, pulse 90, temperature 97.7 F (36.5 C), resp. rate 20, height 5\' 3"  (1.6 m), weight (!) 139 kg, last menstrual period 07/21/2015, SpO2 99%. Body mass index is 54.28 kg/m.  Treatment Plan Summary: Daily contact with patient to assess and evaluate symptoms and progress in treatment and Medication management     ASSESSMENT: Hailey Klein is a 57 yr old female who presented on 9/16 to [facility] with worsening depression and SI in the setting of AH, she was admitted to [facility] on 9/17.  PPHx is significant for Depression, Anxiety, PTSD, and Substance Abuse (Cocaine, Hx of EtOH), and 4 Suicide Attempts (last- [facility]) and 3 Prior Psychiatric Hospitalizations (last- [facility] 11/2015), and no history of Self Injurious Behavior.    Patient's auditory hallucinations have been improving, patient notes depression is somewhat improved as well.  She started on Effexor 37.5 mg today, will continue to monitor response.  Diagnoses / Active Problems: MDD recurrent severe with psychosis GAD PTSD     PLAN: Safety and Monitoring:             --  VOLUNTARY  admission to inpatient psychiatric unit for safety, stabilization and treatment             -- Daily contact with patient to assess and evaluate symptoms and progress in treatment             -- Patient's case to be discussed in multi-disciplinary team meeting             --  Observation Level : q15 minute checks             -- Vital signs:  q12 hours             -- Precautions: suicide, elopement, and assault   2. Psychiatric Diagnoses and Treatment:  Prozac discontinued on 9/22 Continue Effexor 37.5 mg daily (started on 9/22) Continue Remeron 7.5 mg nightly, sleep well controlled with this medication -- The  risks/benefits/side-effects/alternatives to this medication were discussed in detail with the patient and time was given for questions. The patient consents to medication trial.              -- Metabolic profile and EKG monitoring obtained while on an atypical antipsychotic  BMI: 54.28 kg/m2  Lipid Panel: WNL except Trig: 181, TSH: 5.182  BMP showing serum creatinine 1.5 downtrending, GFR 41, BUN 51, hyperglycemic 186.  CBC hemoglobin 9.8,             -- Encouraged patient to participate in unit milieu and in scheduled group therapies              -- Short Term Goals: Ability to identify changes in lifestyle to reduce recurrence of condition will improve and Ability to verbalize feelings will improve             -- Long Term Goals: Improvement in symptoms so as ready for discharge Other PRNS: tylenol, Maalox/Mylanta, milk of magnesia, agitation (benadryl PO/IM, haldol PO/IM, ativan PO/IM)              3. Medical Issues Being Addressed:   #Constipation  Started miralax PRN on 9/22 for reports of constipation   #Tobacco Use Disorder  #COPD Nicotine patch 21mg /24 hours ordered  Singulair 10 mg daily Smoking cessation encouraged   #Acute kidney injury complicating stage IIIb CKD  Trend Scr 2.14>1.50 >> Restarting Demadex 20 mg daily on 9/22 Following hospitalist recommendations, continue holding HCTZ. Monitor BMPs twice weekly, follow-up on repeat on 08/06/2023 Will require PCP follow-up appointment at discharge with nephrology consultation.   #Essential hypertension  Continue Losartan 100 mg daily  Continue Amlodipine 10 mg daily   #T2DM (A1C 11%, uncontrolled) CBGs in 150s Continue mSSI Hold metformin due to CKD   #Diabetic polyneuropathy associated with type 2 diabetes mellitus  Continue pregabalin 100 mg PO BID   #Normocytic anemia  Per hospitalist team, continue B12 and folate supplements.  No need to monitor while at [facility].  #HLD Continue home lipitor 80 mg daily  #Morbid  Obesity Per medical team, lifestyle modification and weight loss as outpatient   4. Discharge Planning:              -- Social work and case management to assist with discharge planning and identification of hospital follow-up needs prior to discharge             -- Estimated LOS: 5-7 days             -- Discharge Concerns: Need to establish a safety plan; Medication compliance and effectiveness             -- Discharge Goals: Return home with outpatient referrals for mental health follow-up including medication management/psychotherapy    I certify that inpatient services furnished can reasonably be expected to improve the patient's condition.   This note was created using a voice recognition software as a result there may be grammatical errors inadvertently enclosed that do not reflect the nature of this encounter. Every attempt  is made to correct such errors.   Dr. Liston Alba, MD PGY-2, Psychiatry Residency  9/22/20247:22 AM

## 2023-08-06 ENCOUNTER — Encounter (HOSPITAL_COMMUNITY): Payer: Self-pay

## 2023-08-06 LAB — GLUCOSE, CAPILLARY
Glucose-Capillary: 191 mg/dL — ABNORMAL HIGH (ref 70–99)
Glucose-Capillary: 256 mg/dL — ABNORMAL HIGH (ref 70–99)
Glucose-Capillary: 307 mg/dL — ABNORMAL HIGH (ref 70–99)
Glucose-Capillary: 173 mg/dL — ABNORMAL HIGH (ref 70–99)
Glucose-Capillary: 271 mg/dL — ABNORMAL HIGH (ref 70–99)

## 2023-08-06 LAB — BASIC METABOLIC PANEL
Anion gap: 9 (ref 5–15)
BUN: 43 mg/dL — ABNORMAL HIGH (ref 6–20)
CO2: 21 mmol/L — ABNORMAL LOW (ref 22–32)
Calcium: 8.6 mg/dL — ABNORMAL LOW (ref 8.9–10.3)
Chloride: 107 mmol/L (ref 98–111)
Creatinine, Ser: 1.4 mg/dL — ABNORMAL HIGH (ref 0.44–1.00)
GFR, Estimated: 44 mL/min — ABNORMAL LOW (ref >60)
Glucose, Bld: 234 mg/dL — ABNORMAL HIGH (ref 70–99)
Potassium: 4.5 mmol/L (ref 3.5–5.1)
Sodium: 137 mmol/L (ref 135–145)

## 2023-08-06 MED ORDER — VENLAFAXINE HCL ER 75 MG PO CP24
75.0000 mg | ORAL_CAPSULE | Freq: Every day | ORAL | Status: DC
  Administered 2023-08-07 – 2023-08-10 (×4): 75 mg via ORAL
  Filled 2023-08-06 (×5): qty 1

## 2023-08-06 MED ORDER — MIRTAZAPINE 7.5 MG PO TABS
7.5000 mg | ORAL_TABLET | Freq: Every day | ORAL | Status: DC
  Administered 2023-08-06: 7.5 mg via ORAL
  Filled 2023-08-06 (×3): qty 1

## 2023-08-06 MED ORDER — GLUCERNA SHAKE PO LIQD
237.0000 mL | Freq: Three times a day (TID) | ORAL | Status: DC
  Administered 2023-08-06 – 2023-08-09 (×8): 237 mL via ORAL
  Filled 2023-08-06 (×15): qty 237

## 2023-08-06 MED ORDER — VENLAFAXINE HCL ER 37.5 MG PO CP24
37.5000 mg | ORAL_CAPSULE | Freq: Every day | ORAL | Status: AC
  Administered 2023-08-06: 37.5 mg via ORAL
  Filled 2023-08-06: qty 1

## 2023-08-06 MED ORDER — INSULIN GLARGINE-YFGN 100 UNIT/ML ~~LOC~~ SOLN
20.0000 [IU] | Freq: Every day | SUBCUTANEOUS | Status: DC
  Administered 2023-08-06 – 2023-08-09 (×4): 20 [IU] via SUBCUTANEOUS

## 2023-08-06 NOTE — Progress Notes (Signed)
[facility] MD Progress Note  08/06/2023 7:03 AM Hailey Klein  MRN:  [id number]  Principal Problem: MDD (major depressive disorder), recurrent episode, severe (HCC) Diagnosis: Principal Problem:   MDD (major depressive disorder), recurrent episode, severe (HCC)   Reason for Admission:  Hailey Klein is a 57 y.o. female  with a past psychiatric history of depression, anxiety, PTSD, and substance abuse (Cocaine, Hx of EtOH), and 4 Suicide Attempts (last- [facility] 2016) and 3 Prior Psychiatric Hospitalizations. Patient was recently discharged from [facility] on 9/18 and transferred to [facility], admitted to the medical floor (9/18-9/20) due to elevated BUN and Scr, with concerns of an acute kidney injury. She has now been re-admitted to [facility] on 08/03/2023 for management of worsening depression in setting of auditory hallucinations. 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  (admitted on 08/03/2023, total  LOS: 3 days )  Yesterday, the psychiatry team made following recommendations:  Prozac discontinued on 9/22 Continue Effexor 37.5 mg daily (started on 9/22) Continue Remeron 7.5 mg nightly, sleep well controlled with this medication  Pertinent information discussed during bed progression:  Patient slept 6.75 hours  PRNs required overnight:  Tylenol 1x Remeron 1x  Information Obtained Today During Patient Interview:  Patient evaluated on the unit. Reports sleep was okay, reports she was waking up because she was anxious about the morning. Reports appetite no issues. States mood is "still feel a little depressed" today. Discussed increasing effexor. She was in agreement. Reports she still occasionally hears her sister telling her to come join her.   Reports goal for today is "to attend more groups".   On interview, suicidal ideations are not present . Homicidal ideations are not present.   Reports still heard sister's voice telling her to  come join her. There are no visual hallucinations, paranoid ideations, or delusional thought processes.   Side effects to currently prescribed medications are none. Continues to report constipation, discussed that she has PRN miralax.  Patient continues to report bilateral leg swelling and "pins and needles" feeling in her legs. Discussed will obtain compression stockings. Discussed recommendation from diabetes educator to start semglee 20U at bedtime and she was in agreement, reports she is on 28U at home.   Past Psychiatric Hx: Depression, Anxiety, PTSD, and Substance Abuse (Cocaine, Hx of EtOH), and 4 Suicide Attempts (last- [facility] 2016) and 3 Prior Psychiatric Hospitalizations (last- [facility] 11/2015), and no history of Self Injurious Behavior.    Substance Abuse Hx: Alcohol: Denies any recent drinking , last drank 4-5 years. Reports prior alcohol use disorder, drank daily, describes severe withdrawal symptoms. Reports a pastor helped her become sober.  Tobacco: Smoke currently since age 27, currently smoking ~ 0.5 ppd. Interested in quitting.  Cannabis: Smokes 0.5 blunt of cannabis a week, purchased from dealer. Uses it for sleep, will smoke before bedtime.  Other Illicit drugs: Cocaine: Using for the past 5 years, uses 1 gram daily , route intrasal. Reports it helps with her chronic pain. Withdrawals characterized by body aches, headaches and insomnia.  Meth: No current use, smoked it a few times back in 2019 Heroin: tried it once and an OD in 2018 Rx drug abuse: Rehab hx: Reports she went to [facility] years ago for her alcohol abuse.    Past Medical History: PCP: None currently.  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 L breast biposy.  Trauma: Denies Seizures: Denies any recent, she is unsure if she had withdrawal  seizures 4-5 years ago.    LMP: Menopause for years now     Family Psychiatric  History: No Known Diagnosis', Substance Abuse, or Suicides    Social History: Living Situation: Lives in [place] with boyfriend ,[name],recently homeless. Has been with [name] for 4 years, describes it as a supportive relationship. Lost apartment after having surgery and recovering from it.  Education: Occupational hx: On SSI since 2019, makes around 914 monthly.  Marital Status: Single Children: Denies Legal: Denies Military: Denies   Access to firearms: Denies Past Medical History:  Past Medical History:  Diagnosis Date   Chronic kidney disease    Diabetes mellitus without complication (HCC)    Dyspnea    H/O suicide attempt    cut wrists - 19-y-o   High cholesterol    Hypertension    Neuropathy    PTSD (post-traumatic stress disorder)    Family History:  Family History  Problem Relation Age of Onset   Alcoholism Other     Current Medications: Current Facility-Administered Medications  Medication Dose Route Frequency Provider Last Rate Last Admin   acetaminophen (TYLENOL) tablet 650 mg  650 mg Oral Q6H PRN Maryagnes Amos, FNP   650 mg at 08/05/23 1515   alum & mag hydroxide-simeth (MAALOX/MYLANTA) 200-200-20 MG/5ML suspension 30 mL  30 mL Oral Q4H PRN Starkes-Perry, Juel Burrow, FNP       amLODipine (NORVASC) tablet 10 mg  10 mg Oral Daily Maryagnes Amos, FNP   10 mg at 08/05/23 0733   ARIPiprazole (ABILIFY) tablet 5 mg  5 mg Oral Daily Maryagnes Amos, FNP   5 mg at 08/05/23 0732   aspirin EC tablet 81 mg  81 mg Oral Daily Maryagnes Amos, FNP   81 mg at 08/05/23 0732   atorvastatin (LIPITOR) tablet 80 mg  80 mg Oral Daily Maryagnes Amos, FNP   80 mg at 08/05/23 1610   diphenhydrAMINE (BENADRYL) capsule 50 mg  50 mg Oral TID PRN Maryagnes Amos, FNP       Or   diphenhydrAMINE (BENADRYL) injection 50 mg  50 mg Intramuscular TID PRN Starkes-Perry, Juel Burrow, FNP       feeding supplement (BOOST / RESOURCE BREEZE) liquid 1 Container  1 Container Oral TID BM Carrion-Carrero, Margely, MD   237 mL at 08/05/23 2110   folic acid (FOLVITE) tablet 1 mg  1 mg Oral Daily Carrion-Carrero, Margely, MD   1 mg at 08/05/23 0731   haloperidol (HALDOL) tablet 5 mg  5 mg Oral TID PRN Maryagnes Amos, FNP       Or   haloperidol lactate (HALDOL) injection 5 mg  5 mg Intramuscular TID PRN Starkes-Perry, Juel Burrow, FNP       insulin aspart (novoLOG) injection 0-15 Units  0-15 Units Subcutaneous TID WC Nkwenti, Doris, NP   3 Units at 08/06/23 0619   LORazepam (ATIVAN) tablet 2 mg  2 mg Oral TID PRN Maryagnes Amos, FNP       Or   LORazepam (ATIVAN) injection 2 mg  2 mg Intramuscular TID PRN Maryagnes Amos, FNP       losartan (COZAAR) tablet 100 mg  100 mg Oral Daily Carrion-Carrero, Margely, MD   100 mg at 08/05/23 0731   magnesium hydroxide (MILK OF MAGNESIA) suspension 30 mL  30 mL  Oral Daily PRN Maryagnes Amos, FNP       mirtazapine (REMERON) tablet 7.5 mg  7.5 mg Oral QHS PRN Bobbitt, Shalon E, NP   7.5 mg at 08/05/23 2112   montelukast (SINGULAIR) tablet 10 mg  10 mg Oral QHS Maryagnes Amos, FNP   10 mg at 08/05/23 2112   nicotine (NICODERM CQ - dosed in mg/24 hours) patch 14 mg  14 mg Transdermal Daily Massengill, Nathan, MD       nicotine polacrilex (NICORETTE) gum 2 mg  2 mg Oral PRN Massengill, Harrold Donath, MD   2 mg at 08/05/23 0747   polyethylene glycol (MIRALAX / GLYCOLAX) packet 17 g  17 g Oral Daily PRN Carrion-Carrero, Margely, MD       potassium chloride (KLOR-CON M) CR tablet 10 mEq  10 mEq Oral Daily Maryagnes Amos, FNP   10 mEq at 08/05/23 0732   pregabalin (LYRICA) capsule 100 mg  100 mg Oral BID Maryagnes Amos, FNP   100 mg at 08/05/23 1707   thiamine (Vitamin B-1) tablet 100 mg  100 mg Oral BID Maryagnes Amos, FNP   100 mg at 08/05/23 1707   torsemide (DEMADEX) tablet 20 mg  20 mg Oral Daily  Carrion-Carrero, Margely, MD   20 mg at 08/05/23 1326   venlafaxine XR (EFFEXOR-XR) 24 hr capsule 37.5 mg  37.5 mg Oral Q breakfast Carrion-Carrero, Karle Starch, MD   37.5 mg at 08/05/23 0732    Lab Results:  Results for orders placed or performed during the hospital encounter of 08/03/23 (from the past 48 hour(s))  Glucose, capillary     Status: Abnormal   Collection Time: 08/04/23 11:42 AM  Result Value Ref Range   Glucose-Capillary 185 (H) 70 - 99 mg/dL    Comment: Glucose reference range applies only to samples taken after fasting for at least 8 hours.  Glucose, capillary     Status: Abnormal   Collection Time: 08/04/23  4:47 PM  Result Value Ref Range   Glucose-Capillary 231 (H) 70 - 99 mg/dL    Comment: Glucose reference range applies only to samples taken after fasting for at least 8 hours.  Glucose, capillary     Status: Abnormal   Collection Time: 08/04/23  7:49 PM  Result Value Ref Range   Glucose-Capillary 223 (H) 70 - 99 mg/dL    Comment: Glucose reference range applies only to samples taken after fasting for at least 8 hours.  Glucose, capillary     Status: Abnormal   Collection Time: 08/05/23  5:34 AM  Result Value Ref Range   Glucose-Capillary 152 (H) 70 - 99 mg/dL    Comment: Glucose reference range applies only to samples taken after fasting for at least 8 hours.  Glucose, capillary     Status: Abnormal   Collection Time: 08/05/23 11:28 AM  Result Value Ref Range   Glucose-Capillary 226 (H) 70 - 99 mg/dL    Comment: Glucose reference range applies only to samples taken after fasting for at least 8 hours.  Glucose, capillary     Status: Abnormal   Collection Time: 08/05/23  5:02 PM  Result Value Ref Range   Glucose-Capillary 251 (H) 70 - 99 mg/dL    Comment: Glucose reference range applies only to samples taken after fasting for at least 8 hours.  Glucose, capillary     Status: Abnormal   Collection Time: 08/05/23 10:20 PM  Result Value Ref Range   Glucose-Capillary  293 (H) 70 -  99 mg/dL    Comment: Glucose reference range applies only to samples taken after fasting for at least 8 hours.  Glucose, capillary     Status: Abnormal   Collection Time: 08/06/23  5:40 AM  Result Value Ref Range   Glucose-Capillary 191 (H) 70 - 99 mg/dL    Comment: Glucose reference range applies only to samples taken after fasting for at least 8 hours.   Comment 1 Notify RN     Blood Alcohol level:  Lab Results  Component Value Date   ETH <10 07/30/2023   ETH <5 11/29/2015    Metabolic Labs: Lab Results  Component Value Date   HGBA1C 11.0 (H) 07/30/2023   MPG 269 07/30/2023   MPG 171 02/02/2023   Lab Results  Component Value Date   PROLACTIN 10.4 07/30/2023   PROLACTIN 55.7 (H) 12/01/2015   Lab Results  Component Value Date   CHOL 175 07/30/2023   TRIG 181 (H) 07/30/2023   HDL 41 07/30/2023   CHOLHDL 4.3 07/30/2023   VLDL 36 07/30/2023   LDLCALC 98 07/30/2023   LDLCALC 49 02/02/2023    Sleep:No data recorded  Physical Findings: AIMS: No  CIWA:    COWS:     Psychiatric Specialty Exam:  Presentation  General Appearance: Poorly groomed  Eye Contact:Good  Speech:Clear and Coherent; Normal Rate  Speech Volume:Normal  Handedness:Not assessed   Mood and Affect  Mood:"just hoping I get better"  Affect:Remains depressed, mood reactivity improved   Thought Process  Thought Processes:Coherent; Goal Directed  Descriptions of Associations:Intact  Orientation:Not assessed  Thought Content:Logical; WDL  History of Schizophrenia/Schizoaffective disorder:No  Duration of Psychotic Symptoms:N/A  Hallucinations:Denies Ideas of Reference:None  Suicidal Thoughts:Denies Homicidal Thoughts:Denies  Sensorium  Memory:Immediate Fair; Recent Fair  Judgment:Fair  Insight:Fair   Executive Functions  Concentration:Fair  Attention Span:Fair  Recall:Fair  Fund of Knowledge:Fair  Language:Good   Psychomotor Activity  Psychomotor  Activity:Normal  Assets  Assets:Communication Skills; Desire for Improvement; Financial Resources/Insurance; Resilience   Sleep  Sleep:Good   Physical Exam: Physical Exam Review of Systems  Constitutional: Negative.   Eyes: Negative.   Cardiovascular: Negative.   Gastrointestinal:  Positive for constipation.  Genitourinary:  Negative for dysuria and urgency.  All other systems reviewed and are negative.   Constitutional:      Appearance: She is morbidly obese. She is not ill-appearing or toxic-appearing.  HENT:     Head: Normocephalic and atraumatic.  Pulmonary:     Comments: Increased respiratory effort when ambulating, otherwise in no distress Skin:    General: Skin is warm and dry.  EXT: Bilateral LE swelling, 1+ pitting edema   Blood pressure 126/63, pulse 92, temperature 98.6 F (37 C), temperature source Oral, resp. rate 20, height 5\' 3"  (1.6 m), weight (!) 139 kg, last menstrual period 07/21/2015, SpO2 100%. Body mass index is 54.28 kg/m.  Treatment Plan Summary: Daily contact with patient to assess and evaluate symptoms and progress in treatment and Medication management     ASSESSMENT: Hailey Klein is a 57 yr old female who presented on 9/16 to [facility] with worsening depression and SI in the setting of AH, she was admitted to [facility] on 9/17.  PPHx is significant for Depression, Anxiety, PTSD, and Substance Abuse (Cocaine, Hx of EtOH), and 4 Suicide Attempts (last- [facility]) and 3 Prior Psychiatric Hospitalizations (last- [facility] 11/2015), and no history of Self Injurious Behavior.    Continues to be depressed, started low dose of effexor yesterday. She is  tolerating without side effects. She is amenable to increasing effexor today. Will also place on scheduled remeron. Cr from AKI continues to improve.   Diagnoses / Active Problems: MDD recurrent severe with psychosis GAD PTSD   PLAN: Safety and Monitoring:             --  VOLUNTARY  admission to inpatient  psychiatric unit for safety, stabilization and treatment             -- Daily contact with patient to assess and evaluate symptoms and progress in treatment             -- Patient's case to be discussed in multi-disciplinary team meeting             -- Observation Level : q15 minute checks             -- Vital signs:  q12 hours             -- Precautions: suicide, elopement, and assault   2. Psychiatric Diagnoses and Treatment:  Prozac discontinued on 9/22 Increase Effexor 75 mg daily (started on 9/22) Continue Remeron 7.5 mg nightly, sleep well controlled with this medication -- The risks/benefits/side-effects/alternatives to this medication were discussed in detail with the patient and time was given for questions. The patient consents to medication trial.              -- Metabolic profile and EKG monitoring obtained while on an atypical antipsychotic  BMI: 54.28 kg/m2  Lipid Panel: WNL except Trig: 181, TSH: 5.182  BMP showing serum creatinine 1.4 downtrending, GFR 41, BUN 51, hyperglycemic 186.  CBC hemoglobin 9.8,             -- Encouraged patient to participate in unit milieu and in scheduled group therapies              -- Short Term Goals: Ability to identify changes in lifestyle to reduce recurrence of condition will improve and Ability to verbalize feelings will improve             -- Long Term Goals: Improvement in symptoms so as ready for discharge Other PRNS: tylenol, Maalox/Mylanta, milk of magnesia, agitation (benadryl PO/IM, haldol PO/IM, ativan PO/IM)              3. 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 Monitor BMPs twice weekly, will plan to repeat 9/25 Will require PCP follow-up appointment at discharge with nephrology consultation.   #Essential hypertension  Continue Losartan 100 mg daily  Continue Amlodipine 10 mg daily   #T2DM (A1C 11%, uncontrolled) CBGs in 150s Continue mSSI Start semglee 20U at bedtime  Hold metformin due to CKD   #Diabetic polyneuropathy associated with type 2 diabetes mellitus  Continue pregabalin 100 mg PO BID Will obtain compression stockings given BLE swelling   #Normocytic anemia  Per hospitalist team, continue B12 and folate supplements.  No need to monitor while at [facility]  #HLD Continue home lipitor 80 mg daily  #Morbid Obesity Per medical team, lifestyle modification and weight loss as outpatient   4. Discharge Planning:              --  Social work and case management to assist with discharge planning and identification of hospital follow-up needs prior to discharge             -- Estimated LOS: 5-7 days             -- Discharge Concerns: Need to establish a safety plan; Medication compliance and effectiveness             -- Discharge Goals: Return home with outpatient referrals for mental health follow-up including medication management/psychotherapy    I certify that inpatient services furnished can reasonably be expected to improve the patient's condition.   This note was created using a voice recognition software as a result there may be grammatical errors inadvertently enclosed that do not reflect the nature of this encounter. Every attempt is made to correct such errors.   Dr. Karie Fetch, MD PGY-2, Psychiatry Residency  9/23/20247:03 AM

## 2023-08-06 NOTE — Progress Notes (Signed)
Called materials management to order compression stockings. They are to return my call.

## 2023-08-06 NOTE — [facility] Group Notes (Signed)
[facility] Group Notes:  (Nursing/MHT/Case Management/Adjunct)  Date:  08/06/2023  Time:  9:12 PM  Type of Therapy:  Group Therapy  Participation Level:  Active  Participation Quality:  Attentive  Affect:  Appropriate  Cognitive:  Appropriate  Insight:  Appropriate  Engagement in Group:  Improving  Modes of Intervention:  Discussion  Summary of Progress/Problems:Pt attended AA Group Meeting  Hailey Klein E Labrandon Knoch 08/06/2023, 9:12 PM

## 2023-08-06 NOTE — BH IP Treatment Plan (Signed)
Interdisciplinary Treatment and Diagnostic Plan Update  08/06/2023 Time of Session: 11:25am Hailey Klein MRN: [id number]  Principal Diagnosis: MDD (major depressive disorder), recurrent episode, severe (HCC)  Secondary Diagnoses: Principal Problem:   MDD (major depressive disorder), recurrent episode, severe (HCC)   Current Medications:  Current Facility-Administered Medications  Medication Dose Route Frequency Provider Last Rate Last Admin   acetaminophen (TYLENOL) tablet 650 mg  650 mg Oral Q6H PRN Maryagnes Amos, FNP   650 mg at 08/06/23 0756   alum & mag hydroxide-simeth (MAALOX/MYLANTA) 200-200-20 MG/5ML suspension 30 mL  30 mL Oral Q4H PRN Maryagnes Amos, FNP       amLODipine (NORVASC) tablet 10 mg  10 mg Oral Daily Maryagnes Amos, FNP   10 mg at 08/06/23 0755   ARIPiprazole (ABILIFY) tablet 5 mg  5 mg Oral Daily Maryagnes Amos, FNP   5 mg at 08/06/23 0756   aspirin EC tablet 81 mg  81 mg Oral Daily Maryagnes Amos, FNP   81 mg at 08/06/23 0754   atorvastatin (LIPITOR) tablet 80 mg  80 mg Oral Daily Maryagnes Amos, FNP   80 mg at 08/06/23 0754   diphenhydrAMINE (BENADRYL) capsule 50 mg  50 mg Oral TID PRN Maryagnes Amos, FNP       Or   diphenhydrAMINE (BENADRYL) injection 50 mg  50 mg Intramuscular TID PRN Starkes-Perry, Juel Burrow, FNP       feeding supplement (BOOST / RESOURCE BREEZE) liquid 1 Container  1 Container Oral TID BM Carrion-Carrero, Margely, MD   1 Container at 08/06/23 1033   folic acid (FOLVITE) tablet 1 mg  1 mg Oral Daily Carrion-Carrero, Margely, MD   1 mg at 08/06/23 0755   haloperidol (HALDOL) tablet 5 mg  5 mg Oral TID PRN Maryagnes Amos, FNP       Or   haloperidol lactate (HALDOL) injection 5 mg  5 mg Intramuscular TID PRN Maryagnes Amos, FNP       insulin aspart (novoLOG) injection 0-15 Units  0-15 Units Subcutaneous TID WC Nkwenti, Tyler Aas, NP   11 Units at 08/06/23 1157   insulin  glargine-yfgn (SEMGLEE) injection 20 Units  20 Units Subcutaneous QHS Karie Fetch, MD       LORazepam (ATIVAN) tablet 2 mg  2 mg Oral TID PRN Maryagnes Amos, FNP       Or   LORazepam (ATIVAN) injection 2 mg  2 mg Intramuscular TID PRN Starkes-Perry, Juel Burrow, FNP       losartan (COZAAR) tablet 100 mg  100 mg Oral Daily Carrion-Carrero, Margely, MD   100 mg at 08/06/23 0754   magnesium hydroxide (MILK OF MAGNESIA) suspension 30 mL  30 mL Oral Daily PRN Maryagnes Amos, FNP       mirtazapine (REMERON) tablet 7.5 mg  7.5 mg Oral QHS Karie Fetch, MD       montelukast (SINGULAIR) tablet 10 mg  10 mg Oral QHS Maryagnes Amos, FNP   10 mg at 08/05/23 2112   nicotine (NICODERM CQ - dosed in mg/24 hours) patch 14 mg  14 mg Transdermal Daily Massengill, Nathan, MD       nicotine polacrilex (NICORETTE) gum 2 mg  2 mg Oral PRN Massengill, Harrold Donath, MD   2 mg at 08/05/23 0747   polyethylene glycol (MIRALAX / GLYCOLAX) packet 17 g  17 g Oral Daily PRN Carrion-Carrero, Margely, MD       potassium chloride (KLOR-CON M) CR tablet  10 mEq  10 mEq Oral Daily Maryagnes Amos, FNP   10 mEq at 08/06/23 0757   pregabalin (LYRICA) capsule 100 mg  100 mg Oral BID Maryagnes Amos, FNP   100 mg at 08/06/23 0756   thiamine (Vitamin B-1) tablet 100 mg  100 mg Oral BID Maryagnes Amos, FNP   100 mg at 08/06/23 0756   torsemide (DEMADEX) tablet 20 mg  20 mg Oral Daily Carrion-Carrero, Karle Starch, MD   20 mg at 08/06/23 0756   venlafaxine XR (EFFEXOR-XR) 24 hr capsule 37.5 mg  37.5 mg Oral Q lunch Karie Fetch, MD       Melene Muller ON 08/07/2023] venlafaxine XR (EFFEXOR-XR) 24 hr capsule 75 mg  75 mg Oral Q breakfast Karie Fetch, MD       PTA Medications: Medications Prior to Admission  Medication Sig Dispense Refill Last Dose   albuterol (VENTOLIN HFA) 108 (90 Base) MCG/ACT inhaler Inhale 2 puffs into the lungs every 4 (four) hours as needed for wheezing or shortness of breath.       amLODipine (NORVASC) 10 MG tablet Take 10 mg by mouth daily.      ARIPiprazole (ABILIFY) 5 MG tablet Take 5 mg by mouth daily.      aspirin EC 81 MG tablet Take 81 mg by mouth daily. Swallow whole.      atorvastatin (LIPITOR) 80 MG tablet Take 80 mg by mouth daily.      BYDUREON BCISE 2 MG/0.85ML AUIJ Inject 2 mg into the skin every Tuesday.      cyanocobalamin (VITAMIN B12) 500 MCG tablet Take 1 tablet (500 mcg total) by mouth daily. 30 tablet 0    diphenhydramine-acetaminophen (TYLENOL PM) 25-500 MG TABS tablet Take 1 tablet by mouth at bedtime as needed (For sleep or pain).      FLUoxetine (PROZAC) 40 MG capsule Take 40 mg by mouth daily.      fluticasone (FLONASE) 50 MCG/ACT nasal spray Place 1 spray into both nostrils daily as needed for allergies or rhinitis.      folic acid (FOLVITE) 1 MG tablet Take 1 tablet (1 mg total) by mouth daily. 30 tablet 0    LANTUS SOLOSTAR 100 UNIT/ML Solostar Pen Inject 28 Units into the skin at bedtime.      losartan (COZAAR) 100 MG tablet Take 100 mg by mouth daily.      montelukast (SINGULAIR) 10 MG tablet Take 10 mg by mouth at bedtime.      naphazoline-glycerin (CLEAR EYES REDNESS) 0.012-0.25 % SOLN Place 2 drops into both eyes 4 (four) times daily as needed (For allergies).      potassium chloride (KLOR-CON M) 10 MEQ tablet Take 1 tablet (10 mEq total) by mouth daily.      pregabalin (LYRICA) 100 MG capsule Take 100 mg by mouth 2 (two) times daily.      SYMBICORT 160-4.5 MCG/ACT inhaler Inhale 2 puffs into the lungs 2 (two) times daily.      thiamine (VITAMIN B1) 100 MG tablet Take 100 mg by mouth 2 (two) times daily.      torsemide (DEMADEX) 20 MG tablet Take 2 tablets (40 mg total) by mouth 2 (two) times daily.       Patient Stressors:    Patient Strengths:    Treatment Modalities: Medication Management, Group therapy, Case management,  1 to 1 session with clinician, Psychoeducation, Recreational therapy.   Physician Treatment Plan for  Primary Diagnosis: MDD (major depressive disorder), recurrent episode, severe (  HCC) Long Term Goal(s): Improvement in symptoms so as ready for discharge   Short Term Goals: Ability to identify changes in lifestyle to reduce recurrence of condition will improve Ability to verbalize feelings will improve  Medication Management: Evaluate patient's response, side effects, and tolerance of medication regimen.  Therapeutic Interventions: 1 to 1 sessions, Unit Group sessions and Medication administration.  Evaluation of Outcomes: Progressing  Physician Treatment Plan for Secondary Diagnosis: Principal Problem:   MDD (major depressive disorder), recurrent episode, severe (HCC)  Long Term Goal(s): Improvement in symptoms so as ready for discharge   Short Term Goals: Ability to identify changes in lifestyle to reduce recurrence of condition will improve Ability to verbalize feelings will improve     Medication Management: Evaluate patient's response, side effects, and tolerance of medication regimen.  Therapeutic Interventions: 1 to 1 sessions, Unit Group sessions and Medication administration.  Evaluation of Outcomes: Progressing   RN Treatment Plan for Primary Diagnosis: MDD (major depressive disorder), recurrent episode, severe (HCC) Long Term Goal(s): Knowledge of disease and therapeutic regimen to maintain health will improve  Short Term Goals: Ability to remain free from injury will improve, Ability to verbalize frustration and anger appropriately will improve, Ability to participate in decision making will improve, Ability to verbalize feelings will improve, Ability to identify and develop effective coping behaviors will improve, and Compliance with prescribed medications will improve  Medication Management: RN will administer medications as ordered by provider, will assess and evaluate patient's response and provide education to patient for prescribed medication. RN will report any  adverse and/or side effects to prescribing provider.  Therapeutic Interventions: 1 on 1 counseling sessions, Psychoeducation, Medication administration, Evaluate responses to treatment, Monitor vital signs and CBGs as ordered, Perform/monitor CIWA, COWS, AIMS and Fall Risk screenings as ordered, Perform wound care treatments as ordered.  Evaluation of Outcomes: Progressing   LCSW Treatment Plan for Primary Diagnosis: MDD (major depressive disorder), recurrent episode, severe (HCC) Long Term Goal(s): Safe transition to appropriate next level of care at discharge, Engage patient in therapeutic group addressing interpersonal concerns.  Short Term Goals: Engage patient in aftercare planning with referrals and resources, Increase social support, Increase emotional regulation, Facilitate acceptance of mental health diagnosis and concerns, Identify triggers associated with mental health/substance abuse issues, and Increase skills for wellness and recovery  Therapeutic Interventions: Assess for all discharge needs, 1 to 1 time with Social worker, Explore available resources and support systems, Assess for adequacy in community support network, Educate family and significant other(s) on suicide prevention, Complete Psychosocial Assessment, Interpersonal group therapy.  Evaluation of Outcomes: Progressing   Progress in Treatment: Attending groups: Yes. Participating in groups: Yes. Taking medication as prescribed: Yes. Toleration medication: Yes. Family/Significant other contact made: No, will contact:  Aileen Pilot (Boyfriend) [phone] Patient understands diagnosis: Yes. Discussing patient identified problems/goals with staff: Yes. Medical problems stabilized or resolved: No. Denies suicidal/homicidal ideation: Yes. Issues/concerns per patient self-inventory: No.  New problem(s) identified: No, Describe:  none reported  New Short Term/Long Term Goal(s):detox, medication management for mood  stabilization; elimination of SI thoughts; development of comprehensive mental wellness/sobriety plan   Patient Goals:  "attend more groups here and maybe find some groups when I leave"  Discharge Plan or Barriers: Patient recently admitted. CSW will continue to follow and assess for appropriate referrals and possible discharge planning.    Reason for Continuation of Hospitalization: Anxiety Depression Medication stabilization Suicidal ideation  Estimated Length of Stay:5-7 days  Last 3 [place] Suicide Severity Risk Score: Flowsheet  Row [facility] (Current[facility]30/2023 in [facility] ED from 02/21/2023 in [facility] Emergency Department at [facility]  C-SSRS RISK CATEGORY No Risk Moderate Risk No Risk       Last [facility] 2/9 Scores:     No data to display          Scribe for Treatment Team: Izell Paskenta, LCSW 08/06/2023 1:00 PM

## 2023-08-06 NOTE — Inpatient Diabetes Management (Signed)
Inpatient Diabetes Program Recommendations  AACE/ADA: New Consensus Statement on Inpatient Glycemic Control (2015)  Target Ranges:  Prepandial:   less than 140 mg/dL      Peak postprandial:   less than 180 mg/dL (1-2 hours)      Critically ill patients:  140 - 180 mg/dL   Lab Results  Component Value Date   GLUCAP 256 (H) 08/06/2023   HGBA1C 11.0 (H) 07/30/2023    Review of Glycemic Control  Latest Reference Range & Units 08/05/23 11:28 08/05/23 17:02 08/05/23 22:20 08/06/23 05:40 08/06/23 07:46  Glucose-Capillary 70 - 99 mg/dL 093 (H) 235 (H) 573 (H) 191 (H) 256 (H)   Diabetes history: DM  Outpatient Diabetes medications:  Bydureon 2 mg q Tuesday, Lantus 28 units q HS Current orders for Inpatient glycemic control:  Novolog 0-15 units tid with meals  Inpatient Diabetes Program Recommendations:    Consider adding Semglee 20 units q HS.   Thanks,  Beryl Meager, RN, BC-ADM Inpatient Diabetes Coordinator Pager [phone]  (8a-5p)

## 2023-08-06 NOTE — Plan of Care (Signed)
Problem: Education: Goal: Emotional status will improve Outcome: Progressing Goal: Mental status will improve Outcome: Progressing   Problem: Health Behavior/Discharge Planning: Goal: Compliance with treatment plan for underlying cause of condition will improve Outcome: Progressing

## 2023-08-06 NOTE — Progress Notes (Signed)
   08/06/23 0755  Psych Admission Type (Psych Patients Only)  Admission Status Voluntary  Psychosocial Assessment  Patient Complaints Depression  Eye Contact Fair  Facial Expression Animated  Affect Appropriate to circumstance  Speech Logical/coherent  Interaction Assertive  Motor Activity Slow  Appearance/Hygiene Unremarkable  Behavior Characteristics Cooperative;Appropriate to situation  Mood Depressed  Thought Process  Coherency WDL  Content WDL  Delusions None reported or observed  Perception WDL  Hallucination None reported or observed  Judgment Impaired  Confusion None  Danger to Self  Current suicidal ideation? Denies  Self-Injurious Behavior No self-injurious ideation or behavior indicators observed or expressed   Agreement Not to Harm Self Yes  Description of Agreement Verbal  Danger to Others  Danger to Others None reported or observed

## 2023-08-06 NOTE — Progress Notes (Signed)
   08/06/23 2322  Psych Admission Type (Psych Patients Only)  Admission Status Voluntary  Psychosocial Assessment  Patient Complaints Depression  Eye Contact Fair  Facial Expression Animated  Affect Appropriate to circumstance  Speech Logical/coherent  Interaction Assertive  Motor Activity Slow  Appearance/Hygiene Body odor;Poor hygiene;In hospital gown  Behavior Characteristics Cooperative;Appropriate to situation  Mood Depressed  Thought Process  Coherency WDL  Content WDL  Delusions None reported or observed  Perception WDL  Hallucination None reported or observed  Judgment Impaired  Confusion None  Danger to Self  Current suicidal ideation? Denies  Self-Injurious Behavior No self-injurious ideation or behavior indicators observed or expressed   Agreement Not to Harm Self Yes  Description of Agreement verbal  Danger to Others  Danger to Others None reported or observed

## 2023-08-06 NOTE — Progress Notes (Signed)
   08/05/23 2247  Psych Admission Type (Psych Patients Only)  Admission Status Voluntary  Psychosocial Assessment  Patient Complaints Depression  Eye Contact Fair  Facial Expression Animated  Affect Appropriate to circumstance  Speech Logical/coherent  Interaction Assertive  Motor Activity Slow  Appearance/Hygiene In hospital gown  Behavior Characteristics Cooperative;Appropriate to situation  Mood Depressed  Thought Process  Coherency WDL  Content WDL  Delusions None reported or observed  Perception WDL  Hallucination None reported or observed  Judgment Impaired  Confusion None  Danger to Self  Current suicidal ideation? Denies  Self-Injurious Behavior No self-injurious ideation or behavior indicators observed or expressed   Agreement Not to Harm Self Yes  Description of Agreement verbal  Danger to Others  Danger to Others None reported or observed

## 2023-08-06 NOTE — [facility] Group Notes (Signed)
Spiritual care group on grief and loss facilitated by Chaplain Dyanne Carrel, [facility]  Group Goal: Support / Education around grief and loss  Members engage in facilitated group support and psycho-social education.  Group Description:  Following introductions and group rules, group members engaged in facilitated group dialogue and support around topic of loss, with particular support around experiences of loss in their lives. Group Identified types of loss (relationships / self / things) and identified patterns, circumstances, and changes that precipitate losses. Reflected on thoughts / feelings around loss, normalized grief responses, and recognized variety in grief experience. Group encouraged individual reflection on safe space and on the coping skills that they are already utilizing.  Group drew on Adlerian / Rogerian and narrative framework  Patient Progress: Hailey Klein attended group.  While verbal participation was limited, she appeared engaged during the group.

## 2023-08-06 NOTE — Group Note (Signed)
Recreation Therapy Group Note   Group Topic:Stress Management  Group Date: 08/06/2023 Start Time: 0935 End Time: 0950 Facilitators: Savien Mamula-McCall, LRT,CTRS Location: [address] Dayroom   Group Topic: Stress Management   Goal Area(s) Addresses:  Patient will actively participate in stress management techniques presented during session.  Patient will successfully identify benefit of practicing stress management post d/c.   Intervention: Relaxation exercise with ambient sound and script   Group Description: Guided Imagery. LRT provided education, instruction, and demonstration on practice of visualization via guided imagery. Patient was asked to participate in the technique introduced during session. LRT debriefed including topics of mindfulness, stress management and specific scenarios each patient could use these techniques. Patients were given suggestions of ways to access scripts post d/c and encouraged to explore Youtube and other apps available on smartphones, tablets, and computers.  Education:  Stress Management, Discharge Planning.   Education Outcome: Acknowledges education   Affect/Mood: Appropriate   Participation Level: Engaged   Participation Quality: Independent   Behavior: Appropriate   Speech/Thought Process: Focused   Insight: Good   Judgement: Good   Modes of Intervention: Stress Management   Patient Response to Interventions:  Engaged   Education Outcome:  In group clarification offered    Clinical Observations/Individualized Feedback: Pt attended and participated in group. Pt was attentive throughout session.     Plan: Continue to engage patient in RT group sessions 2-3x/week.   Chelby Salata-McCall, LRT,CTRS 08/06/2023 11:54 AM

## 2023-08-07 LAB — GLUCOSE, CAPILLARY
Glucose-Capillary: 191 mg/dL — ABNORMAL HIGH (ref 70–99)
Glucose-Capillary: 242 mg/dL — ABNORMAL HIGH (ref 70–99)
Glucose-Capillary: 305 mg/dL — ABNORMAL HIGH (ref 70–99)
Glucose-Capillary: 294 mg/dL — ABNORMAL HIGH (ref 70–99)

## 2023-08-07 MED ORDER — MIRTAZAPINE 15 MG PO TABS
15.0000 mg | ORAL_TABLET | Freq: Every day | ORAL | Status: DC
  Administered 2023-08-07 – 2023-08-09 (×3): 15 mg via ORAL
  Filled 2023-08-07 (×7): qty 1

## 2023-08-07 NOTE — Progress Notes (Signed)
   08/07/23 0900  Psych Admission Type (Psych Patients Only)  Admission Status Voluntary  Psychosocial Assessment  Patient Complaints Depression;Anxiety  Eye Contact Fair  Facial Expression Animated  Affect Appropriate to circumstance  Speech Logical/coherent  Interaction Assertive  Motor Activity Slow  Appearance/Hygiene Poor hygiene  Behavior Characteristics Cooperative;Appropriate to situation  Mood Depressed  Thought Process  Coherency WDL  Content WDL  Delusions None reported or observed  Perception WDL  Hallucination Auditory  Judgment Limited  Confusion None  Danger to Self  Current suicidal ideation? Denies  Self-Injurious Behavior No self-injurious ideation or behavior indicators observed or expressed   Agreement Not to Harm Self Yes  Description of Agreement verbal  Danger to Others  Danger to Others None reported or observed

## 2023-08-07 NOTE — Group Note (Signed)
Recreation Therapy Group Note   Group Topic:Animal Assisted Therapy   Group Date: 08/07/2023 Start Time: 0945 End Time: 1030 Facilitators: Shereka Lafortune-McCall, LRT,CTRS Location: [address] Dayroom   Animal-Assisted Activity (AAA) Program Checklist/Progress Notes Patient Eligibility Criteria Checklist & Daily Group note for Rec Tx Intervention  AAA/T Program Assumption of Risk Form signed by Patient/ or Parent Legal Guardian Yes  Patient is free of allergies or severe asthma Yes  Patient reports no fear of animals Yes  Patient reports no history of cruelty to animals Yes  Patient understands his/her participation is voluntary Yes  Patient washes hands before animal contact Yes  Patient washes hands after animal contact Yes   Affect/Mood: Appropriate   Participation Level: Engaged   Participation Quality: Independent   Behavior: Appropriate   Speech/Thought Process: Focused   Insight: Good   Judgement: Good   Modes of Intervention: Teaching laboratory technician   Patient Response to Interventions:  Engaged   Education Outcome:  In group clarification offered    Clinical Observations/Individualized Feedback: Patient attended session and interacted appropriately with therapy dog and peers. Patient asked appropriate questions about therapy dog and his training. Patient shared stories about their pets at home with group.      Plan: Continue to engage patient in RT group sessions 2-3x/week.   Hailey Klein, LRT,CTRS 08/07/2023 1:05 PM

## 2023-08-07 NOTE — Group Note (Signed)
LCSW Group Therapy Note  Group Date: 08/07/2023 Start Time: 1100 End Time: 1145   Type of Therapy and Topic:  Group Therapy - How To Cope with Nervousness about Discharge   Participation Level:  Did Not Attend   Description of Group This process group involved identification of patients' feelings about discharge. Some of them are scheduled to be discharged soon, while others are new admissions, but each of them was asked to share thoughts and feelings surrounding discharge from the hospital. One common theme was that they are excited at the prospect of going home, while another was that many of them are apprehensive about sharing why they were hospitalized. Patients were given the opportunity to discuss these feelings with their peers in preparation for discharge.  Therapeutic Goals  Patient will identify their overall feelings about pending discharge. Patient will think about how they might proactively address issues that they believe will once again arise once they get home (i.e. with parents). Patients will participate in discussion about having hope for change.     Therapeutic Modalities Cognitive Behavioral Therapy   Ane Payment, LCSW 08/07/2023  1:24 PM

## 2023-08-07 NOTE — Plan of Care (Signed)
Problem: Education: Goal: Emotional status will improve Outcome: Progressing Goal: Mental status will improve Outcome: Progressing Goal: Verbalization of understanding the information provided will improve Outcome: Progressing   Problem: Activity: Goal: Interest or engagement in activities will improve Outcome: Progressing Goal: Sleeping patterns will improve Outcome: Progressing   Problem: Coping: Goal: Ability to verbalize frustrations and anger appropriately will improve Outcome: Progressing Goal: Ability to demonstrate self-control will improve Outcome: Progressing   Problem: Health Behavior/Discharge Planning: Goal: Identification of resources available to assist in meeting health care needs will improve Outcome: Progressing Goal: Compliance with treatment plan for underlying cause of condition will improve Outcome: Progressing   Problem: Physical Regulation: Goal: Ability to maintain clinical measurements within normal limits will improve Outcome: Progressing

## 2023-08-07 NOTE — Progress Notes (Signed)
[facility] MD Progress Note  08/07/2023 11:58 AM Hailey Klein  MRN:  [id number]  Principal Problem: MDD (major depressive disorder), recurrent episode, severe (HCC) Diagnosis: Principal Problem:   MDD (major depressive disorder), recurrent episode, severe (HCC)  Reason for Admission:  Hailey Klein is a 57 y.o. female  with a past psychiatric history of depression, anxiety, PTSD, and substance abuse (Cocaine, Hx of EtOH), and 4 Suicide Attempts (last- [facility] 2016) and 3 Prior Psychiatric Hospitalizations. Patient was recently discharged from [facility] on 9/18 and transferred to [facility], admitted to the medical floor (9/18-9/20) due to elevated BUN and Scr, with concerns of an acute kidney injury. She has now been re-admitted to [facility] on 08/03/2023 for management of worsening depression in setting of auditory hallucinations. 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  (admitted on 08/03/2023, total  LOS: 4 days )  Yesterday, the psychiatry team made following recommendations:  Prozac discontinued on 9/22 Increase Effexor 75 mg daily (started on 9/22) Continue Remeron 7.5 mg nightly, sleep well controlled with this medication  Pertinent information discussed during bed progression:  Patient slept 5.75 hours. Fasting Glucose 191 this AM.    PRNs required overnight:  Tylenol 1x  Information Obtained Today During Patient Interview:  Patient evaluated on the unit. Reports sleep was terrible. Reports having difficulty with falling asleep. Reports appetite no issues. States mood is "so-so" today, attributes depressed mood secondary to continued chronic pain in her legs. Denies side effects from increased effexor. Reports she is still hearing voices, last this morning. Reports the voice is "strong and intense" and is her sister wanting her to be with her.   On interview, suicidal ideations are not present . Homicidal ideations are not present.    There are no visual hallucinations, paranoid ideations, or delusional thought processes. Continued auditory hallucinations.   Side effects to currently prescribed medications are none.  Patient continues to report bilateral leg swelling and "pins and needles" feeling in her legs. Discussed plan to continue getting compression stockings.   Past Psychiatric Hx: Depression, Anxiety, PTSD, and Substance Abuse (Cocaine, Hx of EtOH), and 4 Suicide Attempts (last- [facility]) and 3 Prior Psychiatric Hospitalizations (last- [facility] 11/2015), and no history of Self Injurious Behavior.    Substance Abuse Hx: Alcohol: Denies any recent drinking , last drank 4-5 years. Reports prior alcohol use disorder, drank daily, describes severe withdrawal symptoms. Reports a pastor helped her become sober.  Tobacco: Smoke currently since age 85, currently smoking ~ 0.5 ppd. Interested in quitting.  Cannabis: Smokes 0.5 blunt of cannabis a week, purchased from dealer. Uses it for sleep, will smoke before bedtime.  Other Illicit drugs: Cocaine: Using for the past 5 years, uses 1 gram daily , route intrasal. Reports it helps with her chronic pain. Withdrawals characterized by body aches, headaches and insomnia.  Meth: No current use, smoked it a few times back in 2019 Heroin: tried it once and an OD in 2018 Rx drug abuse: Rehab hx: Reports she went to [facility] years ago for her alcohol abuse.    Past Medical History: PCP: None currently.  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 L breast biposy.  Trauma: Denies Seizures: Denies any recent, she is unsure if she had withdrawal seizures 4-5 years ago.    LMP: Menopause  for years now     Family Psychiatric  History: No Known Diagnosis', Substance Abuse, or Suicides    Social History: Living  Situation: Lives in [place] with boyfriend ,[name],recently homeless. Has been with [name] for 4 years, describes it as a supportive relationship. Lost apartment after having surgery and recovering from it.  Education: Occupational hx: On SSI since 2019, makes around 914 monthly.  Marital Status: Single Children: Denies Legal: Denies Military: Denies   Access to firearms: Denies Past Medical History:  Past Medical History:  Diagnosis Date   Chronic kidney disease    Diabetes mellitus without complication (HCC)    Dyspnea    H/O suicide attempt    cut wrists - 19-y-o   High cholesterol    Hypertension    Neuropathy    PTSD (post-traumatic stress disorder)    Family History:  Family History  Problem Relation Age of Onset   Alcoholism Other     Current Medications: Current Facility-Administered Medications  Medication Dose Route Frequency Provider Last Rate Last Admin   acetaminophen (TYLENOL) tablet 650 mg  650 mg Oral Q6H PRN Maryagnes Amos, FNP   650 mg at 08/06/23 1703   alum & mag hydroxide-simeth (MAALOX/MYLANTA) 200-200-20 MG/5ML suspension 30 mL  30 mL Oral Q4H PRN Starkes-Perry, Juel Burrow, FNP       amLODipine (NORVASC) tablet 10 mg  10 mg Oral Daily Maryagnes Amos, FNP   10 mg at 08/07/23 0757   ARIPiprazole (ABILIFY) tablet 5 mg  5 mg Oral Daily Maryagnes Amos, FNP   5 mg at 08/07/23 0755   aspirin EC tablet 81 mg  81 mg Oral Daily Maryagnes Amos, FNP   81 mg at 08/07/23 0754   atorvastatin (LIPITOR) tablet 80 mg  80 mg Oral Daily Maryagnes Amos, FNP   80 mg at 08/07/23 0754   diphenhydrAMINE (BENADRYL) capsule 50 mg  50 mg Oral TID PRN Maryagnes Amos, FNP       Or   diphenhydrAMINE (BENADRYL) injection 50 mg  50 mg Intramuscular TID PRN Starkes-Perry, Juel Burrow, FNP       feeding supplement (GLUCERNA SHAKE) (GLUCERNA SHAKE) liquid 237 mL  237 mL Oral TID BM Karie Fetch, MD   237 mL at 08/07/23 1051   folic acid (FOLVITE)  tablet 1 mg  1 mg Oral Daily Carrion-Carrero, Margely, MD   1 mg at 08/07/23 0754   haloperidol (HALDOL) tablet 5 mg  5 mg Oral TID PRN Maryagnes Amos, FNP       Or   haloperidol lactate (HALDOL) injection 5 mg  5 mg Intramuscular TID PRN Maryagnes Amos, FNP       insulin aspart (novoLOG) injection 0-15 Units  0-15 Units Subcutaneous TID WC Starleen Blue, NP   3 Units at 08/07/23 0623   insulin glargine-yfgn (SEMGLEE) injection 20 Units  20 Units Subcutaneous QHS Karie Fetch, MD   20 Units at 08/06/23 2204   LORazepam (ATIVAN) tablet 2 mg  2 mg Oral TID PRN Maryagnes Amos, FNP       Or   LORazepam (ATIVAN) injection 2 mg  2 mg Intramuscular TID PRN Maryagnes Amos, FNP       losartan (COZAAR) tablet 100 mg  100 mg Oral Daily Carrion-Carrero, Margely, MD   100 mg at 08/07/23 0754   magnesium hydroxide (MILK OF MAGNESIA) suspension 30 mL  30 mL Oral Daily PRN Maryagnes Amos, FNP  mirtazapine (REMERON) tablet 15 mg  15 mg Oral QHS Karie Fetch, MD       montelukast (SINGULAIR) tablet 10 mg  10 mg Oral QHS Maryagnes Amos, FNP   10 mg at 08/06/23 2107   nicotine (NICODERM CQ - dosed in mg/24 hours) patch 14 mg  14 mg Transdermal Daily Massengill, Nathan, MD       nicotine polacrilex (NICORETTE) gum 2 mg  2 mg Oral PRN Massengill, Harrold Donath, MD   2 mg at 08/07/23 0754   polyethylene glycol (MIRALAX / GLYCOLAX) packet 17 g  17 g Oral Daily PRN Carrion-Carrero, Margely, MD       potassium chloride (KLOR-CON M) CR tablet 10 mEq  10 mEq Oral Daily Maryagnes Amos, FNP   10 mEq at 08/07/23 0754   pregabalin (LYRICA) capsule 100 mg  100 mg Oral BID Maryagnes Amos, FNP   100 mg at 08/07/23 0758   thiamine (Vitamin B-1) tablet 100 mg  100 mg Oral BID Maryagnes Amos, FNP   100 mg at 08/07/23 0756   torsemide (DEMADEX) tablet 20 mg  20 mg Oral Daily Carrion-Carrero, Karle Starch, MD   20 mg at 08/07/23 0755   venlafaxine XR (EFFEXOR-XR)  24 hr capsule 75 mg  75 mg Oral Q breakfast Karie Fetch, MD   75 mg at 08/07/23 4098    Lab Results:  Results for orders placed or performed during the hospital encounter of 08/03/23 (from the past 48 hour(s))  Glucose, capillary     Status: Abnormal   Collection Time: 08/05/23  5:02 PM  Result Value Ref Range   Glucose-Capillary 251 (H) 70 - 99 mg/dL    Comment: Glucose reference range applies only to samples taken after fasting for at least 8 hours.  Glucose, capillary     Status: Abnormal   Collection Time: 08/05/23 10:20 PM  Result Value Ref Range   Glucose-Capillary 293 (H) 70 - 99 mg/dL    Comment: Glucose reference range applies only to samples taken after fasting for at least 8 hours.  Glucose, capillary     Status: Abnormal   Collection Time: 08/06/23  5:40 AM  Result Value Ref Range   Glucose-Capillary 191 (H) 70 - 99 mg/dL    Comment: Glucose reference range applies only to samples taken after fasting for at least 8 hours.   Comment 1 Notify RN   Basic metabolic panel     Status: Abnormal   Collection Time: 08/06/23  6:24 AM  Result Value Ref Range   Sodium 137 135 - 145 mmol/L   Potassium 4.5 3.5 - 5.1 mmol/L   Chloride 107 98 - 111 mmol/L   CO2 21 (L) 22 - 32 mmol/L   Glucose, Bld 234 (H) 70 - 99 mg/dL    Comment: Glucose reference range applies only to samples taken after fasting for at least 8 hours.   BUN 43 (H) 6 - 20 mg/dL   Creatinine, Ser 1.19 (H) 0.44 - 1.00 mg/dL   Calcium 8.6 (L) 8.9 - 10.3 mg/dL   GFR, Estimated 44 (L) >60 mL/min    Comment: (NOTE) Calculated using the CKD-EPI Creatinine Equation (2021)    Anion gap 9 5 - 15    Comment: Performed at [facility], [address], [place], [place] [address]  Glucose, capillary     Status: Abnormal   Collection Time: 08/06/23  7:46 AM  Result Value Ref Range   Glucose-Capillary 256 (H) 70 -  99 mg/dL    Comment: Glucose reference range applies only to samples taken after fasting  for at least 8 hours.  Glucose, capillary     Status: Abnormal   Collection Time: 08/06/23 11:42 AM  Result Value Ref Range   Glucose-Capillary 307 (H) 70 - 99 mg/dL    Comment: Glucose reference range applies only to samples taken after fasting for at least 8 hours.  Glucose, capillary     Status: Abnormal   Collection Time: 08/06/23  4:47 PM  Result Value Ref Range   Glucose-Capillary 173 (H) 70 - 99 mg/dL    Comment: Glucose reference range applies only to samples taken after fasting for at least 8 hours.  Glucose, capillary     Status: Abnormal   Collection Time: 08/06/23  7:38 PM  Result Value Ref Range   Glucose-Capillary 271 (H) 70 - 99 mg/dL    Comment: Glucose reference range applies only to samples taken after fasting for at least 8 hours.  Glucose, capillary     Status: Abnormal   Collection Time: 08/07/23  6:18 AM  Result Value Ref Range   Glucose-Capillary 191 (H) 70 - 99 mg/dL    Comment: Glucose reference range applies only to samples taken after fasting for at least 8 hours.    Blood Alcohol level:  Lab Results  Component Value Date   ETH <10 07/30/2023   ETH <5 11/29/2015    Metabolic Labs: Lab Results  Component Value Date   HGBA1C 11.0 (H) 07/30/2023   MPG 269 07/30/2023   MPG 171 02/02/2023   Lab Results  Component Value Date   PROLACTIN 10.4 07/30/2023   PROLACTIN 55.7 (H) 12/01/2015   Lab Results  Component Value Date   CHOL 175 07/30/2023   TRIG 181 (H) 07/30/2023   HDL 41 07/30/2023   CHOLHDL 4.3 07/30/2023   VLDL 36 07/30/2023   LDLCALC 98 07/30/2023   LDLCALC 49 02/02/2023    Sleep: 5.75   Physical Findings: AIMS: No  CIWA:    COWS:     Psychiatric Specialty Exam:  Presentation  General Appearance: Appropriate for Environment  Eye Contact:Fair  Speech:Clear and Coherent  Speech Volume:Normal  Handedness:Right   Mood and Affect  Mood:Depressed  Affect:Blunt   Thought Process  Thought Processes:Coherent; Goal  Directed  Descriptions of Associations:Intact  Orientation:Full (Time, Place and Person)  Thought Content:Illogical  History of Schizophrenia/Schizoaffective disorder:No  Duration of Psychotic Symptoms:N/A  Hallucinations:Hallucinations: Auditory Description of Auditory Hallucinations: sister telling me to join her  Ideas of Reference:None  Suicidal Thoughts:Suicidal Thoughts: No  Homicidal Thoughts:Homicidal Thoughts: No   Sensorium  Memory:Immediate Fair  Judgment:Fair  Insight:Fair   Executive Functions  Concentration:Fair  Attention Span:Fair  Recall:Fair  Fund of Knowledge:Fair  Language:Fair   Psychomotor Activity  Psychomotor Activity:Psychomotor Activity: Decreased   Assets  Assets:Communication Skills; Desire for Improvement; Intimacy; Financial Resources/Insurance   Sleep  Sleep:Sleep: Fair Number of Hours of Sleep: 5.75    Physical Exam: Physical Exam Review of Systems  Constitutional: Negative.   Eyes: Negative.   Cardiovascular: Negative.   Gastrointestinal:  Negative for constipation.  Genitourinary:  Negative for dysuria and urgency.  All other systems reviewed and are negative.   Constitutional:      Appearance: She is morbidly obese. She is not ill-appearing or toxic-appearing.  HENT:     Head: Normocephalic and atraumatic.  Pulmonary:     Comments: Increased respiratory effort when ambulating, otherwise in no distress Skin:  General: Skin is warm and dry.  EXT: Bilateral LE swelling, 1+ pitting edema   Blood pressure 139/80, pulse 79, temperature 98.1 F (36.7 C), temperature source Oral, resp. rate 14, height 5\' 3"  (1.6 m), weight (!) 139 kg, last menstrual period 07/21/2015, SpO2 100%. Body mass index is 54.28 kg/m.  Treatment Plan Summary: Daily contact with patient to assess and evaluate symptoms and progress in treatment and Medication management     ASSESSMENT: Hailey Klein is a 57 yr old female who  presented on 9/16 to [facility] with worsening depression and SI in the setting of AH, she was admitted to [facility] on 9/17.  PPHx is significant for Depression, Anxiety, PTSD, and Substance Abuse (Cocaine, Hx of EtOH), and 4 Suicide Attempts (last- [facility]) and 3 Prior Psychiatric Hospitalizations (last- [facility] 11/2015), and no history of Self Injurious Behavior.    Continues to be depressed with poor sleep, plan to increase remeron today to treat her poor sleep. She is tolerating increased effexor without side effects. Discussed plan to get BMP tomorrow.   Diagnoses / Active Problems: MDD recurrent severe with psychosis GAD PTSD   PLAN: Safety and Monitoring:             --  VOLUNTARY  admission to inpatient psychiatric unit for safety, stabilization and treatment             -- Daily contact with patient to assess and evaluate symptoms and progress in treatment             -- Patient's case to be discussed in multi-disciplinary team meeting             -- Observation Level : q15 minute checks             -- Vital signs:  q12 hours             -- Precautions: suicide, elopement, and assault   2. Psychiatric Diagnoses and Treatment:  Prozac discontinued on 9/22 Increase Effexor 75 mg daily (started on 9/22) Increase Remeron 15 mg nightly Continue abilify 5mg  daily for psychosis   -- The risks/benefits/side-effects/alternatives to this medication were discussed in detail with the patient and time was given for questions. The patient consents to medication trial.              -- Metabolic profile and EKG monitoring obtained while on an atypical antipsychotic  BMI: 54.28 kg/m2  Lipid Panel: WNL except Trig: 181, TSH: 5.182  BMP showing serum creatinine 1.4 downtrending, GFR 41, BUN 51, hyperglycemic 186.  CBC hemoglobin 9.8,             -- Encouraged patient to participate in unit milieu and in scheduled group therapies              -- Short Term Goals: Ability to identify changes in lifestyle  to reduce recurrence of condition will improve and Ability to verbalize feelings will improve             -- Long Term Goals: Improvement in symptoms so as ready for discharge Other PRNS: tylenol, Maalox/Mylanta, milk of magnesia, agitation (benadryl PO/IM, haldol PO/IM, ativan PO/IM)              3. 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 Monitor BMPs twice weekly, will plan to repeat 9/25 Will require PCP follow-up appointment at discharge with nephrology consultation.   #Essential hypertension  Continue Losartan 100 mg daily  Continue Amlodipine 10 mg daily   #T2DM (A1C 11%, uncontrolled) CBGs in 150s Continue mSSI Start semglee 20U at bedtime  Hold metformin due to CKD   #Diabetic polyneuropathy associated with type 2 diabetes mellitus  Continue pregabalin 100 mg PO BID, can consider increasing Will obtain compression stockings given BLE swelling   #Normocytic anemia  Per hospitalist team, continue B12 and folate supplements.  No need to monitor while at [facility].  #HLD Continue home lipitor 80 mg daily  #Morbid Obesity Per medical team, lifestyle modification and weight loss as outpatient   4. Discharge Planning:              -- Social work and case management to assist with discharge planning and identification of hospital follow-up needs prior to discharge             -- Estimated LOS: 5-7 days             -- Discharge Concerns: Need to establish a safety plan; Medication compliance and effectiveness             -- Discharge Goals: Return home with outpatient referrals for  mental health follow-up including medication management/psychotherapy    I certify that inpatient services furnished can reasonably be expected to improve the patient's condition.   This note was created using a voice recognition software as a result there may be grammatical errors inadvertently enclosed that do not reflect the nature of this encounter. Every attempt is made to correct such errors.   Dr. Karie Fetch, MD PGY-2, Psychiatry Residency  9/24/202411:58 AM

## 2023-08-07 NOTE — [facility] Suicide Risk Assessment (Signed)
[facility] INPATIENT:  Family/Significant Other Suicide Prevention Education  Suicide Prevention Education:  Education Completed; Hailey Klein (Boyfriend) [phone]) [phone] ,  has been identified by the patieNT -Hailey Klein (Boyfriend) [phone]  with whom the patient will be residing, and identified as the person(s) who will aid the patient in the event of a mental health crisis (suicidal ideations/suicide attempt).  With written consent from the patient, the family member/significant other has been provided the following suicide prevention education, prior to the and/or following the discharge of the patient.  The suicide prevention education provided includes the following: Suicide risk factors Suicide prevention and interventions National Suicide Hotline telephone number [facility] assessment telephone number [facility] Emergency Assistance 911 [facility] and/or Residential Mobile Crisis Unit telephone number  Request made of family/significant other to: Remove weapons (e.g., guns, rifles, knives), all items previously/currently identified as safety concern.   Remove drugs/medications (over-the-counter, prescriptions, illicit drugs), all items previously/currently identified as a safety concern.  Hailey Klein (Boyfriend) [phone]  verbalizes understanding of the suicide prevention education information provided.  The family member/significant other agrees to remove the items of safety concern listed above.  Kaenan Jake S Mitcheal Sweetin 08/07/2023, 3:14 PM

## 2023-08-07 NOTE — Plan of Care (Signed)
  Problem: Education: Goal: Knowledge of [facility] Education information/materials will improve Outcome: Progressing Goal: Emotional status will improve Outcome: Progressing Goal: Mental status will improve Outcome: Progressing Goal: Verbalization of understanding the information provided will improve Outcome: Progressing   Problem: Activity: Goal: Interest or engagement in activities will improve Outcome: Progressing Goal: Sleeping patterns will improve Outcome: Not Progressing

## 2023-08-08 LAB — BASIC METABOLIC PANEL
Anion gap: 9 (ref 5–15)
BUN: 53 mg/dL — ABNORMAL HIGH (ref 6–20)
CO2: 23 mmol/L (ref 22–32)
Calcium: 8.3 mg/dL — ABNORMAL LOW (ref 8.9–10.3)
Chloride: 104 mmol/L (ref 98–111)
Creatinine, Ser: 1.5 mg/dL — ABNORMAL HIGH (ref 0.44–1.00)
GFR, Estimated: 41 mL/min — ABNORMAL LOW (ref >60)
Glucose, Bld: 250 mg/dL — ABNORMAL HIGH (ref 70–99)
Potassium: 4.5 mmol/L (ref 3.5–5.1)
Sodium: 136 mmol/L (ref 135–145)

## 2023-08-08 LAB — GLUCOSE, CAPILLARY
Glucose-Capillary: 163 mg/dL — ABNORMAL HIGH (ref 70–99)
Glucose-Capillary: 290 mg/dL — ABNORMAL HIGH (ref 70–99)
Glucose-Capillary: 254 mg/dL — ABNORMAL HIGH (ref 70–99)
Glucose-Capillary: 419 mg/dL — ABNORMAL HIGH (ref 70–99)
Glucose-Capillary: 391 mg/dL — ABNORMAL HIGH (ref 70–99)

## 2023-08-08 MED ORDER — INSULIN ASPART 100 UNIT/ML IJ SOLN
0.0000 [IU] | Freq: Every day | INTRAMUSCULAR | Status: DC
  Administered 2023-08-09: 4 [IU] via SUBCUTANEOUS

## 2023-08-08 MED ORDER — INSULIN ASPART 100 UNIT/ML IJ SOLN
7.0000 [IU] | Freq: Once | INTRAMUSCULAR | Status: AC
  Administered 2023-08-08: 7 [IU] via SUBCUTANEOUS

## 2023-08-08 MED ORDER — ARIPIPRAZOLE 10 MG PO TABS
10.0000 mg | ORAL_TABLET | Freq: Every day | ORAL | Status: DC
  Administered 2023-08-09 – 2023-08-10 (×2): 10 mg via ORAL
  Filled 2023-08-08 (×3): qty 1

## 2023-08-08 MED ORDER — INSULIN ASPART 100 UNIT/ML IJ SOLN
0.0000 [IU] | Freq: Three times a day (TID) | INTRAMUSCULAR | Status: DC
  Administered 2023-08-08: 8 [IU] via SUBCUTANEOUS
  Administered 2023-08-09: 3 [IU] via SUBCUTANEOUS
  Administered 2023-08-09: 11 [IU] via SUBCUTANEOUS
  Administered 2023-08-09: 3 [IU] via SUBCUTANEOUS
  Administered 2023-08-10: 2 [IU] via SUBCUTANEOUS

## 2023-08-08 MED ORDER — ARIPIPRAZOLE 5 MG PO TABS
5.0000 mg | ORAL_TABLET | Freq: Once | ORAL | Status: AC
  Administered 2023-08-08: 5 mg via ORAL
  Filled 2023-08-08: qty 1

## 2023-08-08 MED ORDER — INSULIN ASPART 100 UNIT/ML IJ SOLN
4.0000 [IU] | Freq: Three times a day (TID) | INTRAMUSCULAR | Status: DC
  Administered 2023-08-08 – 2023-08-10 (×5): 4 [IU] via SUBCUTANEOUS

## 2023-08-08 NOTE — Progress Notes (Signed)
Adult Psychoeducational Group Note  Date:  08/08/2023 Time:  9:41 PM  Group Topic/Focus:  Wrap-Up Group:   The focus of this group is to help patients review their daily goal of treatment and discuss progress on daily workbooks.  Participation Level:  Active  Participation Quality:  Appropriate  Affect:  Appropriate  Cognitive:  Appropriate  Insight: Appropriate  Engagement in Group:  Engaged  Modes of Intervention:  Activity  Additional Comments:  Pt attended and participated in NA group.  Stillman Buenger Katrinka Blazing 08/08/2023, 9:41 PM

## 2023-08-08 NOTE — Plan of Care (Signed)
  Problem: Education: Goal: Knowledge of [facility] Education information/materials will improve Outcome: Progressing   Problem: Activity: Goal: Interest or engagement in activities will improve Outcome: Progressing   Problem: Coping: Goal: Ability to verbalize frustrations and anger appropriately will improve Outcome: Progressing   

## 2023-08-08 NOTE — Inpatient Diabetes Management (Signed)
Inpatient Diabetes Program Recommendations  AACE/ADA: New Consensus Statement on Inpatient Glycemic Control (2015)  Target Ranges:  Prepandial:   less than 140 mg/dL      Peak postprandial:   less than 180 mg/dL (1-2 hours)      Critically ill patients:  140 - 180 mg/dL   Lab Results  Component Value Date   GLUCAP 163 (H) 08/08/2023   HGBA1C 11.0 (H) 07/30/2023    Review of Glycemic Control  Latest Reference Range & Units 08/07/23 06:18 08/07/23 11:50 08/07/23 17:16 08/07/23 20:30 08/08/23 05:40  Glucose-Capillary 70 - 99 mg/dL 962 (H) 952 (H) 841 (H) 294 (H) 163 (H)   Diabetes history: DM 2 Outpatient Diabetes medications: Bydureon 2 mg QTuesday, Lantus 28 units qhs  Current orders for Inpatient glycemic control:  Semglee 20 units qhs Novolog 0-15 units tid  Glucerna tid between meals  Note: Glucose trends increase after meal/supplement intake  Inpatient Diabetes Program Recommendations:    -   Start Novolog 4 units tid meal coverage if eating >50% of meals -   Add Novolog hs scale  Thanks,  Christena Deem RN, MSN, BC-ADM Inpatient Diabetes Coordinator Team Pager [phone] (8a-5p)

## 2023-08-08 NOTE — Progress Notes (Signed)
   08/08/23 0846  Psych Admission Type (Psych Patients Only)  Admission Status Voluntary  Psychosocial Assessment  Patient Complaints Anxiety;Depression  Eye Contact Fair  Facial Expression Animated  Affect Appropriate to circumstance  Speech Argumentative  Interaction Assertive  Motor Activity Slow  Appearance/Hygiene In hospital gown  Behavior Characteristics Cooperative;Appropriate to situation  Mood Depressed;Pleasant  Thought Process  Coherency WDL  Content WDL  Delusions None reported or observed  Perception WDL  Hallucination None reported or observed  Judgment Limited  Confusion None  Danger to Self  Current suicidal ideation? Denies  Self-Injurious Behavior No self-injurious ideation or behavior indicators observed or expressed   Agreement Not to Harm Self Yes  Description of Agreement verbal  Danger to Others  Danger to Others None reported or observed

## 2023-08-08 NOTE — Progress Notes (Signed)
[facility] MD Progress Note  08/08/2023 12:54 PM Hailey Klein  MRN:  [id number]  Principal Problem: MDD (major depressive disorder), recurrent episode, severe (HCC) Diagnosis: Principal Problem:   MDD (major depressive disorder), recurrent episode, severe (HCC)  Reason for Admission:  Hailey Klein is a 57 y.o. female  with a past psychiatric history of depression, anxiety, PTSD, and substance abuse (Cocaine, Hx of EtOH), and 4 Suicide Attempts (last- [facility] 2016) and 3 Prior Psychiatric Hospitalizations. Patient was recently discharged from [facility] on 9/18 and transferred to [facility] admitted to the medical floor (9/18-9/20) due to elevated BUN and Scr, with concerns of an acute kidney injury. She has now been re-admitted to [facility] on 08/03/2023 for management of worsening depression in setting of auditory hallucinations. 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  (admitted on 08/03/2023, total  LOS: 5 days )  Yesterday, the psychiatry team made following recommendations:  Prozac discontinued on 9/22 Increase Effexor 75 mg daily (started on 9/22) Increase Remeron 15 mg nightly  Continue abilify 5mg  daily for psychosis   Pertinent information discussed during bed progression:  Patient slept 4.25 hours. Fasting Glucose 163 this AM.    PRNs required overnight:  Tylenol 1x  Information Obtained Today During Patient Interview:  Patient evaluated on the unit. Reports difficulty with sleep due to thunder last night. Reports having difficulty with staying asleep but no issues with falling asleep. Reports appetite no issues. Continues to state mood is "so-so", again due to the continued chronic pain in her legs. Denies side effects from medications. Reports she is still hearing voices, last this morning. Reports it's from her sister, last this morning and throughout the day. She reports she has been hearing her sister's voice since she passed  away last year but right now the voice is more so stating to come be with her which she reported she did not hear prior to when she was feeling depressed.   On interview, suicidal ideations are not present . Homicidal ideations are not present.   There are no visual hallucinations, paranoid ideations, or delusional thought processes. Continued auditory hallucinations.   Side effects to currently prescribed medications are none.  Patient continues to report bilateral leg swelling and "pins and needles" feeling in her legs. Discussed plan and with nursing to get compression stockings.   Past Psychiatric Hx: Depression, Anxiety, PTSD, and Substance Abuse (Cocaine, Hx of EtOH), and 4 Suicide Attempts (last- [facility]) and 3 Prior Psychiatric Hospitalizations (last- [facility] 11/2015), and no history of Self Injurious Behavior.    Substance Abuse Hx: Alcohol: Denies any recent drinking , last drank 4-5 years. Reports prior alcohol use disorder, drank daily, describes severe withdrawal symptoms. Reports a pastor helped her become sober.  Tobacco: Smoke currently since age 46, currently smoking ~ 0.5 ppd. Interested in quitting.  Cannabis: Smokes 0.5 blunt of cannabis a week, purchased from dealer. Uses it for sleep, will smoke before bedtime.  Other Illicit drugs: Cocaine: Using for the past 5 years, uses 1 gram daily , route intrasal. Reports it helps with her chronic pain. Withdrawals characterized by body aches, headaches and insomnia.  Meth: No current use, smoked it a few times back in 2019 Heroin: tried it once and an OD in 2018 Rx drug abuse: Rehab hx: Reports she went to [facility] years ago for her alcohol abuse.    Past Medical History: PCP: None currently.  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 L breast biposy.  Trauma:  Denies Seizures: Denies any recent, she is unsure if she had withdrawal seizures 4-5 years ago.    LMP: Menopause for years now     Family Psychiatric  History: No Known Diagnosis', Substance Abuse, or Suicides    Social History: Living Situation: Lives in [place] with boyfriend ,Hailey Klein,recently homeless. Has been with Hailey Klein for 4 years, describes it as a supportive relationship. Lost apartment after having surgery and recovering from it.  Education: Occupational hx: On SSI since 2019, makes around 914 monthly.  Marital Status: Single Children: Denies Legal: Denies Military: Denies   Access to firearms: Denies Past Medical History:  Past Medical History:  Diagnosis Date   Chronic kidney disease    Diabetes mellitus without complication (HCC)    Dyspnea    H/O suicide attempt    cut wrists - 19-y-o   High cholesterol    Hypertension    Neuropathy    PTSD (post-traumatic stress disorder)    Family History:  Family History  Problem Relation Age of Onset   Alcoholism Other     Current Medications: Current Facility-Administered Medications  Medication Dose Route Frequency Provider Last Rate Last Admin   acetaminophen (TYLENOL) tablet 650 mg  650 mg Oral Q6H PRN Maryagnes Amos, FNP   650 mg at 08/08/23 0349   alum & mag hydroxide-simeth (MAALOX/MYLANTA) 200-200-20 MG/5ML suspension 30 mL  30 mL Oral Q4H PRN Maryagnes Amos, FNP       amLODipine (NORVASC) tablet 10 mg  10 mg Oral Daily Maryagnes Amos, FNP   10 mg at 08/08/23 0745   [START ON 08/09/2023] ARIPiprazole (ABILIFY) tablet 10 mg  10 mg Oral Daily Karie Fetch, MD       ARIPiprazole (ABILIFY) tablet 5 mg  5 mg Oral Once Karie Fetch, MD       aspirin EC tablet 81 mg  81 mg Oral Daily Maryagnes Amos, FNP   81 mg at 08/08/23 0744   atorvastatin (LIPITOR) tablet 80 mg  80 mg Oral Daily Maryagnes Amos, FNP   80 mg at 08/08/23 0744   diphenhydrAMINE (BENADRYL) capsule 50 mg  50 mg  Oral TID PRN Maryagnes Amos, FNP       Or   diphenhydrAMINE (BENADRYL) injection 50 mg  50 mg Intramuscular TID PRN Starkes-Perry, Juel Burrow, FNP       feeding supplement (GLUCERNA SHAKE) (GLUCERNA SHAKE) liquid 237 mL  237 mL Oral TID BM Karie Fetch, MD   237 mL at 08/08/23 0954   folic acid (FOLVITE) tablet 1 mg  1 mg Oral Daily Carrion-Carrero, Margely, MD   1 mg at 08/08/23 0746   haloperidol (HALDOL) tablet 5 mg  5 mg Oral TID PRN Maryagnes Amos, FNP       Or   haloperidol lactate (HALDOL) injection 5 mg  5 mg Intramuscular TID PRN Maryagnes Amos, FNP       insulin aspart (novoLOG) injection 0-15 Units  0-15 Units Subcutaneous TID WC Starleen Blue, NP   8 Units at 08/08/23 1208   insulin glargine-yfgn (SEMGLEE) injection 20 Units  20 Units Subcutaneous QHS Karie Fetch, MD   20 Units at 08/07/23 2116   LORazepam (ATIVAN) tablet 2 mg  2 mg Oral TID PRN Maryagnes Amos, FNP  Or   LORazepam (ATIVAN) injection 2 mg  2 mg Intramuscular TID PRN Starkes-Perry, Juel Burrow, FNP       losartan (COZAAR) tablet 100 mg  100 mg Oral Daily Carrion-Carrero, Margely, MD   100 mg at 08/08/23 0744   magnesium hydroxide (MILK OF MAGNESIA) suspension 30 mL  30 mL Oral Daily PRN Maryagnes Amos, FNP       mirtazapine (REMERON) tablet 15 mg  15 mg Oral QHS Karie Fetch, MD   15 mg at 08/07/23 2115   montelukast (SINGULAIR) tablet 10 mg  10 mg Oral QHS Maryagnes Amos, FNP   10 mg at 08/07/23 2115   nicotine (NICODERM CQ - dosed in mg/24 hours) patch 14 mg  14 mg Transdermal Daily Massengill, Nathan, MD       nicotine polacrilex (NICORETTE) gum 2 mg  2 mg Oral PRN Massengill, Harrold Donath, MD   2 mg at 08/08/23 0750   polyethylene glycol (MIRALAX / GLYCOLAX) packet 17 g  17 g Oral Daily PRN Carrion-Carrero, Margely, MD       potassium chloride (KLOR-CON M) CR tablet 10 mEq  10 mEq Oral Daily Maryagnes Amos, FNP   10 mEq at 08/08/23 0745   pregabalin  (LYRICA) capsule 100 mg  100 mg Oral BID Maryagnes Amos, FNP   100 mg at 08/08/23 0747   thiamine (Vitamin B-1) tablet 100 mg  100 mg Oral BID Maryagnes Amos, FNP   100 mg at 08/08/23 0748   torsemide (DEMADEX) tablet 20 mg  20 mg Oral Daily Carrion-Carrero, Margely, MD   20 mg at 08/08/23 0744   venlafaxine XR (EFFEXOR-XR) 24 hr capsule 75 mg  75 mg Oral Q breakfast Karie Fetch, MD   75 mg at 08/08/23 4098    Lab Results:  Results for orders placed or performed during the hospital encounter of 08/03/23 (from the past 48 hour(s))  Glucose, capillary     Status: Abnormal   Collection Time: 08/06/23  4:47 PM  Result Value Ref Range   Glucose-Capillary 173 (H) 70 - 99 mg/dL    Comment: Glucose reference range applies only to samples taken after fasting for at least 8 hours.  Glucose, capillary     Status: Abnormal   Collection Time: 08/06/23  7:38 PM  Result Value Ref Range   Glucose-Capillary 271 (H) 70 - 99 mg/dL    Comment: Glucose reference range applies only to samples taken after fasting for at least 8 hours.  Glucose, capillary     Status: Abnormal   Collection Time: 08/07/23  6:18 AM  Result Value Ref Range   Glucose-Capillary 191 (H) 70 - 99 mg/dL    Comment: Glucose reference range applies only to samples taken after fasting for at least 8 hours.  Glucose, capillary     Status: Abnormal   Collection Time: 08/07/23 11:50 AM  Result Value Ref Range   Glucose-Capillary 242 (H) 70 - 99 mg/dL    Comment: Glucose reference range applies only to samples taken after fasting for at least 8 hours.  Glucose, capillary     Status: Abnormal   Collection Time: 08/07/23  5:16 PM  Result Value Ref Range   Glucose-Capillary 305 (H) 70 - 99 mg/dL    Comment: Glucose reference range applies only to samples taken after fasting for at least 8 hours.  Glucose, capillary     Status: Abnormal   Collection Time: 08/07/23  8:30 PM  Result Value Ref Range  Glucose-Capillary 294  (H) 70 - 99 mg/dL    Comment: Glucose reference range applies only to samples taken after fasting for at least 8 hours.   Comment 1 Notify RN   Glucose, capillary     Status: Abnormal   Collection Time: 08/08/23  5:40 AM  Result Value Ref Range   Glucose-Capillary 163 (H) 70 - 99 mg/dL    Comment: Glucose reference range applies only to samples taken after fasting for at least 8 hours.   Comment 1 Notify RN   Basic metabolic panel     Status: Abnormal   Collection Time: 08/08/23  6:39 AM  Result Value Ref Range   Sodium 136 135 - 145 mmol/L   Potassium 4.5 3.5 - 5.1 mmol/L   Chloride 104 98 - 111 mmol/L   CO2 23 22 - 32 mmol/L   Glucose, Bld 250 (H) 70 - 99 mg/dL    Comment: Glucose reference range applies only to samples taken after fasting for at least 8 hours.   BUN 53 (H) 6 - 20 mg/dL   Creatinine, Ser 2.84 (H) 0.44 - 1.00 mg/dL   Calcium 8.3 (L) 8.9 - 10.3 mg/dL   GFR, Estimated 41 (L) >60 mL/min    Comment: (NOTE) Calculated using the CKD-EPI Creatinine Equation (2021)    Anion gap 9 5 - 15    Comment: Performed at [facility], [address] [place], [place] [address]  Glucose, capillary     Status: Abnormal   Collection Time: 08/08/23 12:00 PM  Result Value Ref Range   Glucose-Capillary 290 (H) 70 - 99 mg/dL    Comment: Glucose reference range applies only to samples taken after fasting for at least 8 hours.   Comment 1 Notify RN    Comment 2 Document in Chart     Blood Alcohol level:  Lab Results  Component Value Date   ETH <10 07/30/2023   ETH <5 11/29/2015    Metabolic Labs: Lab Results  Component Value Date   HGBA1C 11.0 (H) 07/30/2023   MPG 269 07/30/2023   MPG 171 02/02/2023   Lab Results  Component Value Date   PROLACTIN 10.4 07/30/2023   PROLACTIN 55.7 (H) 12/01/2015   Lab Results  Component Value Date   CHOL 175 07/30/2023   TRIG 181 (H) 07/30/2023   HDL 41 07/30/2023   CHOLHDL 4.3 07/30/2023   VLDL 36 07/30/2023    LDLCALC 98 07/30/2023   LDLCALC 49 02/02/2023   Sleep: 4.25  Physical Findings: AIMS: No  CIWA:    COWS:     Psychiatric Specialty Exam:  Presentation  General Appearance: Appropriate for Environment  Eye Contact:Fair  Speech:Clear and Coherent  Speech Volume:Normal  Handedness:Right   Mood and Affect  Mood:Depressed  Affect:Blunt   Thought Process  Thought Processes:Coherent; Goal Directed  Descriptions of Associations:Intact  Orientation:Full (Time, Place and Person)  Thought Content:Logical  History of Schizophrenia/Schizoaffective disorder:No  Duration of Psychotic Symptoms:Less than six months  Hallucinations:Hallucinations: Auditory Description of Auditory Hallucinations: sister telling me to join her  Ideas of Reference:None  Suicidal Thoughts:Suicidal Thoughts: No  Homicidal Thoughts:Homicidal Thoughts: No   Sensorium  Memory:Immediate Fair; Recent Fair  Judgment:Fair  Insight:Fair   Executive Functions  Concentration:Fair  Attention Span:Fair  Recall:Fair  Fund of Knowledge:Fair  Language:Fair   Psychomotor Activity  Psychomotor Activity:Psychomotor Activity: Decreased   Assets  Assets:Communication Skills; Desire for Improvement; Financial Resources/Insurance   Sleep  Sleep:Sleep: Fair Number of Hours of Sleep:  4.25   Physical Exam: Physical Exam Review of Systems  Constitutional: Negative.   Eyes: Negative.   Cardiovascular: Negative.   Gastrointestinal:  Negative for constipation.  Genitourinary:  Negative for dysuria and urgency.  All other systems reviewed and are negative.  Constitutional:      Appearance: She is morbidly obese. She is not ill-appearing or toxic-appearing.  HENT:     Head: Normocephalic and atraumatic.  Pulmonary:     Comments: Increased respiratory effort when ambulating, otherwise in no distress Skin:    General: Skin is warm and dry.  EXT: Bilateral LE swelling, 1+ pitting edema    Blood pressure 125/72, pulse 87, temperature 98.6 F (37 C), temperature source Oral, resp. rate 14, height 5\' 3"  (1.6 m), weight (!) 139 kg, last menstrual period 07/21/2015, SpO2 100%. Body mass index is 54.28 kg/m.  Treatment Plan Summary: Daily contact with patient to assess and evaluate symptoms and progress in treatment and Medication management     ASSESSMENT: Chi Crise is a 57 yr old female who presented on 9/16 to [facility] with worsening depression and SI in the setting of AH, she was admitted to [facility] on 9/17.  PPHx is significant for Depression, Anxiety, PTSD, and Substance Abuse (Cocaine, Hx of EtOH), and 4 Suicide Attempts (last- [facility]) and 3 Prior Psychiatric Hospitalizations (last- [facility] 11/2015), and no history of Self Injurious Behavior.    Continues to be depressed and with continued auditory hallucinations. Discussed plan to increase abilify to help treat the hallucinations. Poor sleep last night due to thunder outside. Will not make any other changes to her medications. Reports she will get her check tomorrow and we will plan to discharge on Friday.   Diagnoses / Active Problems: MDD recurrent severe with psychosis GAD PTSD   PLAN: Safety and Monitoring:             --  VOLUNTARY  admission to inpatient psychiatric unit for safety, stabilization and treatment             -- Daily contact with patient to assess and evaluate symptoms and progress in treatment             -- Patient's case to be discussed in multi-disciplinary team meeting             -- Observation Level : q15 minute checks             -- Vital signs:  q12 hours             -- Precautions: suicide, elopement, and assault   2. Psychiatric Diagnoses and Treatment:  Prozac discontinued on 9/22 Continue Effexor 75 mg daily (started on 9/22) Continue Remeron 15 mg nightly Increase abilify 10mg  daily for psychosis   -- The risks/benefits/side-effects/alternatives to this medication were  discussed in detail with the patient and time was given for questions. The patient consents to medication trial.              -- Metabolic profile and EKG monitoring obtained while on an atypical antipsychotic  BMI: 54.28 kg/m2  Lipid Panel: WNL except Trig: 181, TSH: 5.182  BMP showing serum creatinine 1.4 downtrending, GFR 41, BUN 51, hyperglycemic 186.  CBC hemoglobin 9.8,             -- Encouraged patient to participate in unit milieu and in scheduled group therapies              -- Short Term Goals: Ability to  identify changes in lifestyle to reduce recurrence of condition will improve and Ability to verbalize feelings will improve             -- Long Term Goals: Improvement in symptoms so as ready for discharge Other PRNS: tylenol, Maalox/Mylanta, milk of magnesia, agitation (benadryl PO/IM, haldol PO/IM, ativan PO/IM)              3. 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 Will monitor BMP prior to discharge (on Friday) Will require PCP follow-up appointment at discharge with nephrology consultation.   #Essential hypertension  Continue Losartan 100 mg daily  Continue Amlodipine 10 mg daily   #T2DM (A1C 11%, uncontrolled) CBGs in 150s Continue mSSI Start semglee 20U at bedtime  Hold metformin due to CKD   #Diabetic polyneuropathy associated with type 2 diabetes mellitus  Continue pregabalin 100 mg PO BID, can consider increasing Will obtain compression stockings given BLE swelling   #Normocytic anemia  Per hospitalist team, continue B12 and folate supplements.  No need to  monitor while at [facility].  #HLD Continue home lipitor 80 mg daily  #Morbid Obesity Per medical team, lifestyle modification and weight loss as outpatient   4. Discharge Planning:              -- Social work and case management to assist with discharge planning and identification of hospital follow-up needs prior to discharge             -- Estimated LOS: 5-7 days             -- Discharge Concerns: Need to establish a safety plan; Medication compliance and effectiveness -Discussed with SW setting up patient with PCP appointment and nephrology             -- Discharge Goals: Return home with outpatient referrals for mental health follow-up including medication management/psychotherapy    I certify that inpatient services furnished can reasonably be expected to improve the patient's condition.   This note was created using a voice recognition software as a result there may be grammatical errors inadvertently enclosed that do not reflect the nature of this encounter. Every attempt is made to correct such errors.   Dr. Karie Fetch, MD PGY-2, Psychiatry Residency  9/25/202412:54 PM

## 2023-08-08 NOTE — Plan of Care (Signed)
?  Problem: Activity: ?Goal: Interest or engagement in activities will improve ?Outcome: Progressing ?  ?Problem: Coping: ?Goal: Ability to verbalize frustrations and anger appropriately will improve ?Outcome: Progressing ?  ?Problem: Coping: ?Goal: Ability to demonstrate self-control will improve ?Outcome: Progressing ?  ?

## 2023-08-08 NOTE — Progress Notes (Signed)
   08/07/23 2200  Psych Admission Type (Psych Patients Only)  Admission Status Voluntary  Psychosocial Assessment  Patient Complaints Anxiety;Depression  Eye Contact Fair  Facial Expression Animated  Affect Appropriate to circumstance  Speech Logical/coherent  Interaction Assertive  Motor Activity Slow  Appearance/Hygiene In hospital gown  Behavior Characteristics Cooperative;Appropriate to situation  Mood Pleasant  Thought Process  Coherency WDL  Content WDL  Delusions None reported or observed  Perception WDL  Hallucination None reported or observed  Judgment Poor  Confusion None  Danger to Self  Current suicidal ideation? Denies  Self-Injurious Behavior No self-injurious ideation or behavior indicators observed or expressed   Agreement Not to Harm Self Yes  Description of Agreement verbal  Danger to Others  Danger to Others None reported or observed

## 2023-08-08 NOTE — [facility] Group Notes (Signed)
Group Topic/Focus:  Goals Group:   The focus of this group is to help patients establish daily goals to achieve during treatment and discuss how the patient can incorporate goal setting into their daily lives to aide in recovery.       Participation Level:  Active   Participation Quality:  Attentive   Affect:  Appropriate   Cognitive:  Appropriate   Insight: Appropriate   Engagement in Group:  Engaged   Modes of Intervention:  Discussion   Additional Comments:   Patient attended goals group and was attentive the duration of it. Patient's goal was to go to groups.Pt has no feelings of wanting to hurt herself or others.

## 2023-08-08 NOTE — [facility] Group Notes (Signed)
Spirituality group facilitated by Kathleen Argue, BCC.  Group Description: Group focused on topic of hope. Patients participated in facilitated discussion around topic, connecting with one another around experiences and definitions for hope. Group members engaged with visual explorer photos, reflecting on what hope looks like for them today. Group engaged in discussion around how their definitions of hope are present today in hospital.  Modalities: Psycho-social ed, Adlerian, Narrative, MI  Patient Progress: Hailey Klein attended group.  While her verbal participation was minimal, she demonstrated engagement throughout the session.

## 2023-08-08 NOTE — Group Note (Signed)
Recreation Therapy Group Note   Group Topic:Communication  Group Date: 08/08/2023 Start Time: 0932 End Time: 1005 Facilitators: Silviano Neuser-McCall, LRT,CTRS Location: [address] Dayroom   Group Topic: Communication, Problem Solving   Goal Area(s) Addresses:  Patient will effectively listen to complete activity.  Patient will identify communication skills used to make activity successful.  Patient will identify how skills used during activity can be used to reach post d/c goals.    Intervention: Building surveyor Activity - Geometric pattern cards, pencils, blank paper    Group Description: Geometric Drawings.  Three volunteers from the peer group will be shown an abstract picture with a particular arrangement of geometrical shapes.  Each round, one 'speaker' will describe the pattern, as accurately as possible without revealing the image to the group.  The remaining group members will listen and draw the picture to reflect how it is described to them. Patients with the role of 'listener' cannot ask clarifying questions but, may request that the speaker repeat a direction. Once the drawings are complete, the presenter will show the rest of the group the picture and compare how close each person came to drawing the picture. LRT will facilitate a post-activity discussion regarding effective communication and the importance of planning, listening, and asking for clarification in daily interactions with others.  Education: Environmental consultant, Active listening, Support systems, Discharge planning  Education Outcome: Acknowledges understanding/In group clarification offered/Needs additional education.    Affect/Mood: Appropriate   Participation Level: Active   Participation Quality: Independent   Behavior: Appropriate   Speech/Thought Process: Relevant   Insight: Moderate   Judgement: Moderate   Modes of Intervention: Group work   Patient Response to Interventions:   Receptive   Education Outcome:  In group clarification offered    Clinical Observations/Individualized Feedback: Pt came in half way through group session. Pt joined in with the activity. Pt attempted to try and complete the drawings as described. Pt expressed frustration with trying to complete the drawings and would give up before jumping back in.    Plan: Continue to engage patient in RT group sessions 2-3x/week.   Stewart Pimenta-McCall, LRT,CTRS 08/08/2023 12:22 PM

## 2023-08-09 LAB — GLUCOSE, CAPILLARY
Glucose-Capillary: 166 mg/dL — ABNORMAL HIGH (ref 70–99)
Glucose-Capillary: 189 mg/dL — ABNORMAL HIGH (ref 70–99)
Glucose-Capillary: 340 mg/dL — ABNORMAL HIGH (ref 70–99)
Glucose-Capillary: 307 mg/dL — ABNORMAL HIGH (ref 70–99)

## 2023-08-09 MED ORDER — HYDROXYZINE HCL 25 MG PO TABS
25.0000 mg | ORAL_TABLET | Freq: Three times a day (TID) | ORAL | Status: DC | PRN
  Administered 2023-08-09: 25 mg via ORAL
  Filled 2023-08-09: qty 1

## 2023-08-09 MED ORDER — TRAZODONE HCL 50 MG PO TABS
50.0000 mg | ORAL_TABLET | Freq: Every evening | ORAL | Status: DC | PRN
  Administered 2023-08-09: 50 mg via ORAL
  Filled 2023-08-09: qty 1

## 2023-08-09 NOTE — Progress Notes (Signed)
   08/09/23 0104  Psych Admission Type (Psych Patients Only)  Admission Status Voluntary  Psychosocial Assessment  Patient Complaints Anxiety  Eye Contact Fair  Facial Expression Animated  Affect Appropriate to circumstance  Speech Logical/coherent  Interaction Assertive  Motor Activity Slow  Appearance/Hygiene In hospital gown  Behavior Characteristics Cooperative;Appropriate to situation  Mood Anxious  Thought Process  Coherency WDL  Content WDL  Delusions None reported or observed  Perception WDL  Hallucination None reported or observed  Judgment Poor  Confusion None  Danger to Self  Current suicidal ideation? Denies  Self-Injurious Behavior No self-injurious ideation or behavior indicators observed or expressed   Agreement Not to Harm Self Yes  Description of Agreement verbal  Danger to Others  Danger to Others None reported or observed   Pt became irritable and agitated due to pain on her legs, pt stated her toes are forming gangrene, the writer was able to assess and noticed the right small toe is already drying up. Pt was given agitation protocol then went back to sleep, will continue to monitor.

## 2023-08-09 NOTE — Plan of Care (Signed)
Problem: Education: Goal: Knowledge of [facility] Education information/materials will improve Outcome: Progressing   Problem: Activity: Goal: Interest or engagement in activities will improve Outcome: Progressing   Problem: Coping: Goal: Ability to verbalize frustrations and anger appropriately will improve Outcome: Progressing

## 2023-08-09 NOTE — Group Note (Signed)
LCSW Group Therapy Note   Group Date: 08/09/2023 Start Time: 1100 End Time: 1200  Type of Therapy and Topic: Group Therapy: Relationship Check-Ins   Participation Level: Did Not Attend   Description Group:  In this group patients are encouraged to rate how well or not well they are able to improve their relationships in Beliefs and Values, Communication, Family and Friends, Actuary and Household, and Intimacy. Patients will be able to discuss and identify what is going well within these aspects and what is not. Patients will be able to find appropriate ways, solutions, and skills that will help them within the selected relationships category. Patients will be encouraged to share and reflect on why things within their relationships are not going so well and get feedback from the instructor or their peers.  This group will be solution focused and process-oriented with patients' participation in sharing and listening to their own and peers experience; along with receive support and advice on how to improve or change the circumstance that is known to be challenging in their relationships category (Beliefs and Values, Communication, Family and Friends, Actuary and Household, and Intimacy).   Therapeutic Goals:  Patient will identify their strengths and weakness within their Beliefs and Values, Communication, Family and Friends, Actuary and Household, and Intimacy relationships. Patient will identify reasons why and how they can improve their relationships.  Patient will identify how they can be supportive and honest to themselves and in their relationships.  Patient will be able to gain support and give support to others with similar challenges.   Summary of Patient Progress  Did not attend group  Therapeutic Modalities:  Solution Focused Therapy Cognitive Behavioral Therapy  Psychodynamic Therapy  Dialectical Behavior Therapy   Izell [place], LCSW 08/09/2023  12:41 PM

## 2023-08-09 NOTE — Progress Notes (Signed)
  [facility] Adult Case Management Discharge Plan :  Will you be returning to the same living situation after discharge:  Yes,  Pt will be returning to the extended stay hotel At discharge, do you have transportation home?: Yes,  [name] (Boyfriend) [phone]) [phone] Do you have the ability to pay for your medications: Yes,  Insured  Release of information consent forms completed and in the chart;  Patient's signature needed at discharge.  Patient to Follow up at:  Follow-up Information     [facility] Follow up on 08/17/2023.   Why: You have a hospital follow up appointment for therapy and medication management services on 08/17/23 at 10:00 am. This appointment will be Virtual via telephone. Contact information: [address]  [address] [place] [address] [phone]          [facility]/2023.   Why: You have an appointment with this clinic for PCP services on 08/30/23 at 8:30 am.  This appointment will be held in person.  You will need to obtain a referral to a nephrologist. Contact information: [address] [place] [address] [phone]                Next level of care provider has access to [facility] Link:yes  Safety Planning and Suicide Prevention discussed: Yes,  Aileen Pilot (Boyfriend) [phone]) [phone]     Has patient been referred to the Quitline?: Patient refused referral for treatment  Patient has been referred for addiction treatment: Yes, the patient will follow up with an outpatient provider for substance use disorder. Psychiatrist/APP: appointment made, Therapist: appointment made, and PCP: appointment made Patient to continue working towards treatment goals after discharge. Patient no longer meets criteria for inpatient criteria per attending physician. Continue taking medications as prescribed, nursing to provide instructions at discharge. Follow up with all scheduled appointments.    Hailey Klein S Tayvion Lauder, LCSW 08/09/2023, 3:01 PM

## 2023-08-09 NOTE — Progress Notes (Signed)
   08/09/23 0854  Psych Admission Type (Psych Patients Only)  Admission Status Voluntary  Psychosocial Assessment  Patient Complaints Depression  Eye Contact Fair  Facial Expression Flat  Affect Flat  Speech Logical/coherent  Interaction Assertive  Motor Activity Slow  Appearance/Hygiene In hospital gown  Behavior Characteristics Cooperative  Mood Depressed  Thought Process  Coherency WDL  Content WDL  Delusions None reported or observed  Perception WDL  Hallucination None reported or observed  Judgment Poor  Confusion None  Danger to Self  Current suicidal ideation? Denies  Self-Injurious Behavior No self-injurious ideation or behavior indicators observed or expressed   Agreement Not to Harm Self Yes  Description of Agreement verbal  Danger to Others  Danger to Others None reported or observed

## 2023-08-09 NOTE — Plan of Care (Signed)
Notified by nursing concern of patient's right pinky toe.   Examined patient's right foot. Foot was edematous, patient noted to have previous bilateral ankle edema with decreased sensation. Toes were warm, well-perfused. No suspicion for gangrene at this time.   Patient was advised to follow-up outpatient with PCP on discharge.

## 2023-08-09 NOTE — [facility] Group Notes (Signed)
Adult Psychoeducational Group Note  Date:  08/09/2023 Time:  9:56 PM  Group Topic/Focus:  Wrap-Up Group:   The focus of this group is to help patients review their daily goal of treatment and discuss progress on daily workbooks.  Participation Level:  Active  Participation Quality:  Appropriate  Affect:  Appropriate  Cognitive:  Appropriate  Insight: Appropriate  Engagement in Group:  Engaged  Modes of Intervention:  Discussion and Support  Additional Comments:  Pt told that today was a good day on the unit, the highlight of which was learning of her upcoming discharge. On the subject of staying well upon discharge, Pt mentioned wanting to see a therapist on a regular basis. Pt rated her day a 7 out of 10.  Christ Kick 08/09/2023, 9:56 PM

## 2023-08-09 NOTE — Progress Notes (Addendum)
[facility] MD Progress Note  08/09/2023 9:27 AM Hailey Klein  MRN:  [id number]  Principal Problem: MDD (major depressive disorder), recurrent episode, severe (HCC) Diagnosis: Principal Problem:   MDD (major depressive disorder), recurrent episode, severe (HCC)  Reason for Admission:  Hailey Klein is a 57 y.o. female  with a past psychiatric history of depression, anxiety, PTSD, and substance abuse (Cocaine, Hx of EtOH), and 4 Suicide Attempts (last- [facility] 2016) and 3 Prior Psychiatric Hospitalizations. Patient was recently discharged from [facility] on 9/18 and transferred to [facility], admitted to the medical floor (9/18-9/20) due to elevated BUN and Scr, with concerns of an acute kidney injury. She has now been re-admitted to [facility] on 08/03/2023 for management of worsening depression in setting of auditory hallucinations. 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  (admitted on 08/03/2023, total  LOS: 6 days )  Yesterday, the psychiatry team made following recommendations:  Prozac discontinued on 9/22 Continue Effexor 75 mg daily (started on 9/22) Continue Remeron 15 mg nightly Increase abilify 10mg  daily for psychosis  Pertinent information discussed during bed progression:   VSS. Patient became irritable and agitated due to pain in her legs, stating that her toes are forming gangrene. Nurse assessed right small toe. She was given agitation protocol and then went back to sleep. Patient slept 4.5 hours. Fasting Glucose 166 this AM.    PRNs required overnight:  Tylenol 1x Agitation protocol: haldol, benadryl, ativan  Information Obtained Today During Patient Interview:  Reports sleep started out rough but then nurse brought her some pills that knocked her out. She reports she asked for trazodone but it wasn't on the [facility] so then she brought the agitation protocol. Reports great sleep. No issues with appetite. Denies any side effects to  the medicine. Reports voices are decreased compared to yesterday. Reports mood is less depressed, excited to go home tomorrow. Denies SI/HI, just hearing the voice of the sister, reports less than before. No VH. Reports had BM last night Reports had some reflux yesterday but it went away. Reports her ride will be here around 12pm tomorrow. Reports stable swelling in her feet that are stable   Reports goals for today include going to groups.   Past Psychiatric Hx: Depression, Anxiety, PTSD, and Substance Abuse (Cocaine, Hx of EtOH), and 4 Suicide Attempts (last- [facility] 2016) and 3 Prior Psychiatric Hospitalizations (last- [facility] 11/2015), and no history of Self Injurious Behavior.    Substance Abuse Hx: Alcohol: Denies any recent drinking , last drank 4-5 years. Reports prior alcohol use disorder, drank daily, describes severe withdrawal symptoms. Reports a pastor helped her become sober.  Tobacco: Smoke currently since age 35, currently smoking ~ 0.5 ppd. Interested in quitting.  Cannabis: Smokes 0.5 blunt of cannabis a week, purchased from dealer. Uses it for sleep, will smoke before bedtime.  Other Illicit drugs: Cocaine: Using for the past 5 years, uses 1 gram daily , route intrasal. Reports it helps with her chronic pain. Withdrawals characterized by body aches, headaches and insomnia.  Meth: No current use, smoked it a few times back in 2019 Heroin: tried it once and an OD in 2018 Rx drug abuse: Rehab hx: Reports she went to [facility] years ago for her alcohol abuse.    Past Medical History: PCP: None currently.  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 L breast biposy.  Trauma: Denies Seizures: Denies any recent, she is unsure if she had withdrawal seizures 4-5 years ago.    LMP: Menopause for years now     Family  Psychiatric  History: No Known Diagnosis', Substance Abuse, or Suicides    Social History: Living Situation: Lives in [place] with boyfriend ,[name],recently homeless. Has been with [name] for 4 years, describes it as a supportive relationship. Lost apartment after having surgery and recovering from it.  Education: Occupational hx: On SSI since 2019, makes around 914 monthly.  Marital Status: Single Children: Denies Legal: Denies Military: Denies   Access to firearms: Denies Past Medical History:  Past Medical History:  Diagnosis Date   Chronic kidney disease    Diabetes mellitus without complication (HCC)    Dyspnea    H/O suicide attempt    cut wrists - 19-y-o   High cholesterol    Hypertension    Neuropathy    PTSD (post-traumatic stress disorder)    Family History:  Family History  Problem Relation Age of Onset   Alcoholism Other     Current Medications: Current Facility-Administered Medications  Medication Dose Route Frequency Provider Last Rate Last Admin   acetaminophen (TYLENOL) tablet 650 mg  650 mg Oral Q6H PRN Maryagnes Amos, FNP   650 mg at 08/08/23 1350   alum & mag hydroxide-simeth (MAALOX/MYLANTA) 200-200-20 MG/5ML suspension 30 mL  30 mL Oral Q4H PRN Starkes-Perry, Juel Burrow, FNP       amLODipine (NORVASC) tablet 10 mg  10 mg Oral Daily Maryagnes Amos, FNP   10 mg at 08/09/23 0854   ARIPiprazole (ABILIFY) tablet 10 mg  10 mg Oral Daily Karie Fetch, MD   10 mg at 08/09/23 0858   aspirin EC tablet 81 mg  81 mg Oral Daily Maryagnes Amos, FNP   81 mg at 08/09/23 0854   atorvastatin (LIPITOR) tablet 80 mg  80 mg Oral Daily Maryagnes Amos, FNP   80 mg at 08/09/23 0854   diphenhydrAMINE (BENADRYL) capsule 50 mg  50 mg Oral TID PRN Maryagnes Amos, FNP   50 mg at 08/08/23 2328   Or   diphenhydrAMINE (BENADRYL) injection 50 mg  50 mg Intramuscular TID PRN Maryagnes Amos, FNP       feeding supplement (GLUCERNA SHAKE)  (GLUCERNA SHAKE) liquid 237 mL  237 mL Oral TID BM Karie Fetch, MD   237 mL at 08/08/23 1351   folic acid (FOLVITE) tablet 1 mg  1 mg Oral Daily Carrion-Carrero, Margely, MD   1 mg at 08/09/23 0854   haloperidol (HALDOL) tablet 5 mg  5 mg Oral TID PRN Maryagnes Amos, FNP   5 mg at 08/08/23 2328   Or   haloperidol lactate (HALDOL) injection 5 mg  5 mg Intramuscular TID PRN Maryagnes Amos, FNP       hydrOXYzine (ATARAX) tablet 25 mg  25 mg Oral TID PRN Karie Fetch, MD       insulin aspart (novoLOG) injection 0-15 Units  0-15 Units Subcutaneous TID WC Karie Fetch, MD   3 Units at 08/09/23 0705   insulin aspart (novoLOG) injection 0-5 Units  0-5 Units Subcutaneous QHS Karie Fetch, MD       insulin aspart (novoLOG) injection 4 Units  4 Units Subcutaneous TID WC Karie Fetch, MD   4 Units at 08/09/23 0706   insulin glargine-yfgn (SEMGLEE) injection 20 Units  20 Units  Subcutaneous QHS Karie Fetch, MD   20 Units at 08/08/23 2148   LORazepam (ATIVAN) tablet 2 mg  2 mg Oral TID PRN Maryagnes Amos, FNP   2 mg at 08/08/23 2328   Or   LORazepam (ATIVAN) injection 2 mg  2 mg Intramuscular TID PRN Maryagnes Amos, FNP       losartan (COZAAR) tablet 100 mg  100 mg Oral Daily Carrion-Carrero, Margely, MD   100 mg at 08/09/23 0854   magnesium hydroxide (MILK OF MAGNESIA) suspension 30 mL  30 mL Oral Daily PRN Maryagnes Amos, FNP       mirtazapine (REMERON) tablet 15 mg  15 mg Oral QHS Karie Fetch, MD   15 mg at 08/08/23 2143   montelukast (SINGULAIR) tablet 10 mg  10 mg Oral QHS Maryagnes Amos, FNP   10 mg at 08/08/23 2143   nicotine (NICODERM CQ - dosed in mg/24 hours) patch 14 mg  14 mg Transdermal Daily Massengill, Nathan, MD       nicotine polacrilex (NICORETTE) gum 2 mg  2 mg Oral PRN Massengill, Harrold Donath, MD   2 mg at 08/09/23 0900   polyethylene glycol (MIRALAX / GLYCOLAX) packet 17 g  17 g Oral Daily PRN Carrion-Carrero, Margely,  MD       potassium chloride (KLOR-CON M) CR tablet 10 mEq  10 mEq Oral Daily Maryagnes Amos, FNP   10 mEq at 08/09/23 0855   pregabalin (LYRICA) capsule 100 mg  100 mg Oral BID Maryagnes Amos, FNP   100 mg at 08/09/23 0900   thiamine (Vitamin B-1) tablet 100 mg  100 mg Oral BID Maryagnes Amos, FNP   100 mg at 08/09/23 0858   torsemide (DEMADEX) tablet 20 mg  20 mg Oral Daily Carrion-Carrero, Karle Starch, MD   20 mg at 08/09/23 0854   traZODone (DESYREL) tablet 50 mg  50 mg Oral QHS PRN Karie Fetch, MD       venlafaxine XR (EFFEXOR-XR) 24 hr capsule 75 mg  75 mg Oral Q breakfast Karie Fetch, MD   75 mg at 08/09/23 4696    Lab Results:  Results for orders placed or performed during the hospital encounter of 08/03/23 (from the past 48 hour(s))  Glucose, capillary     Status: Abnormal   Collection Time: 08/07/23 11:50 AM  Result Value Ref Range   Glucose-Capillary 242 (H) 70 - 99 mg/dL    Comment: Glucose reference range applies only to samples taken after fasting for at least 8 hours.  Glucose, capillary     Status: Abnormal   Collection Time: 08/07/23  5:16 PM  Result Value Ref Range   Glucose-Capillary 305 (H) 70 - 99 mg/dL    Comment: Glucose reference range applies only to samples taken after fasting for at least 8 hours.  Glucose, capillary     Status: Abnormal   Collection Time: 08/07/23  8:30 PM  Result Value Ref Range   Glucose-Capillary 294 (H) 70 - 99 mg/dL    Comment: Glucose reference range applies only to samples taken after fasting for at least 8 hours.   Comment 1 Notify RN   Glucose, capillary     Status: Abnormal   Collection Time: 08/08/23  5:40 AM  Result Value Ref Range   Glucose-Capillary 163 (H) 70 - 99 mg/dL    Comment: Glucose reference range applies only to samples taken after fasting for at least 8 hours.   Comment 1 Notify RN  Basic metabolic panel     Status: Abnormal   Collection Time: 08/08/23  6:39 AM  Result Value Ref  Range   Sodium 136 135 - 145 mmol/L   Potassium 4.5 3.5 - 5.1 mmol/L   Chloride 104 98 - 111 mmol/L   CO2 23 22 - 32 mmol/L   Glucose, Bld 250 (H) 70 - 99 mg/dL    Comment: Glucose reference range applies only to samples taken after fasting for at least 8 hours.   BUN 53 (H) 6 - 20 mg/dL   Creatinine, Ser 1.61 (H) 0.44 - 1.00 mg/dL   Calcium 8.3 (L) 8.9 - 10.3 mg/dL   GFR, Estimated 41 (L) >60 mL/min    Comment: (NOTE) Calculated using the CKD-EPI Creatinine Equation (2021)    Anion gap 9 5 - 15    Comment: Performed at [facility], [address], [place], [place] [address]  Glucose, capillary     Status: Abnormal   Collection Time: 08/08/23 12:00 PM  Result Value Ref Range   Glucose-Capillary 290 (H) 70 - 99 mg/dL    Comment: Glucose reference range applies only to samples taken after fasting for at least 8 hours.   Comment 1 Notify RN    Comment 2 Document in Chart   Glucose, capillary     Status: Abnormal   Collection Time: 08/08/23  4:55 PM  Result Value Ref Range   Glucose-Capillary 254 (H) 70 - 99 mg/dL    Comment: Glucose reference range applies only to samples taken after fasting for at least 8 hours.  Glucose, capillary     Status: Abnormal   Collection Time: 08/08/23  9:12 PM  Result Value Ref Range   Glucose-Capillary 419 (H) 70 - 99 mg/dL    Comment: Glucose reference range applies only to samples taken after fasting for at least 8 hours.   Comment 1 Notify RN    Comment 2 Document in Chart   Glucose, capillary     Status: Abnormal   Collection Time: 08/08/23 10:06 PM  Result Value Ref Range   Glucose-Capillary 391 (H) 70 - 99 mg/dL    Comment: Glucose reference range applies only to samples taken after fasting for at least 8 hours.   Comment 1 Notify RN    Comment 2 Document in Chart   Glucose, capillary     Status: Abnormal   Collection Time: 08/09/23  5:54 AM  Result Value Ref Range   Glucose-Capillary 166 (H) 70 - 99 mg/dL    Comment:  Glucose reference range applies only to samples taken after fasting for at least 8 hours.   Comment 1 Notify RN    Comment 2 Document in Chart     Blood Alcohol level:  Lab Results  Component Value Date   ETH <10 07/30/2023   ETH <5 11/29/2015    Metabolic Labs: Lab Results  Component Value Date   HGBA1C 11.0 (H) 07/30/2023   MPG 269 07/30/2023   MPG 171 02/02/2023   Lab Results  Component Value Date   PROLACTIN 10.4 07/30/2023   PROLACTIN 55.7 (H) 12/01/2015   Lab Results  Component Value Date   CHOL 175 07/30/2023   TRIG 181 (H) 07/30/2023   HDL 41 07/30/2023   CHOLHDL 4.3 07/30/2023   VLDL 36 07/30/2023   LDLCALC 98 07/30/2023   LDLCALC 49 02/02/2023   Sleep: 4.25  Physical Findings: AIMS: No  CIWA:    COWS:     Psychiatric  Specialty Exam:  Presentation  General Appearance: Appropriate for Environment  Eye Contact:Fair  Speech:Clear and Coherent  Speech Volume:Normal  Handedness:Right   Mood and Affect  Mood:Euthymic  Affect:Appropriate   Thought Process  Thought Processes:Coherent; Goal Directed  Descriptions of Associations:Intact  Orientation:Full (Time, Place and Person)  Thought Content:Logical  History of Schizophrenia/Schizoaffective disorder:No  Duration of Psychotic Symptoms:Less than six months  Hallucinations:Hallucinations: Auditory Description of Auditory Hallucinations: sister voice, reports it is decreased  Ideas of Reference:None  Suicidal Thoughts:Suicidal Thoughts: No  Homicidal Thoughts:Homicidal Thoughts: No   Sensorium  Memory:Immediate Fair; Recent Fair  Judgment:Fair  Insight:Fair   Executive Functions  Concentration:Fair  Attention Span:Fair  Recall:Fair  Fund of Knowledge:Fair  Language:Fair   Psychomotor Activity  Psychomotor Activity:Psychomotor Activity: Decreased   Assets  Assets:Communication Skills; Desire for Improvement; Financial Resources/Insurance; Housing   Sleep   Sleep:Sleep: Fair Number of Hours of Sleep: 4.25   Physical Exam Constitutional:      Appearance: the patient is not toxic-appearing. She is morbidly obese. Pulmonary:     Effort: Pulmonary effort is normal.  Neurological:     General: No focal deficit present.     Mental Status: the patient is alert and oriented to person, place, and time.  EXT: Bilateral LE swelling, 1+ pitting edema   Review of Systems  Respiratory:  Negative for shortness of breath.   Cardiovascular:  Negative for chest pain.  Gastrointestinal:  Negative for abdominal pain, constipation, diarrhea, nausea and vomiting. Positive for reflux.  Neurological:  Negative for headaches.   Blood pressure 115/68, pulse 90, temperature 98.6 F (37 C), temperature source Oral, resp. rate 14, height 5\' 3"  (1.6 m), weight (!) 139 kg, last menstrual period 07/21/2015, SpO2 100%. Body mass index is 54.28 kg/m.  Treatment Plan Summary: Daily contact with patient to assess and evaluate symptoms and progress in treatment and Medication management     ASSESSMENT: Hailey Klein is a 57 yr old female who presented on 9/16 to [facility] with worsening depression and SI in the setting of AH, she was admitted to [facility] on 9/17.  PPHx is significant for Depression, Anxiety, PTSD, and Substance Abuse (Cocaine, Hx of EtOH), and 4 Suicide Attempts (last- [facility]) and 3 Prior Psychiatric Hospitalizations (last- [facility] 11/2015), and no history of Self Injurious Behavior.    Depression continuing to improve, auditory hallucinations decreasing. No side effects from increasing abilify. Received agitation protocol due to insomnia, adding in PRN trazodone, atarax. Will not make any other changes to her medications. She is agreeable with plan to discharge tomorrow.   Diagnoses / Active Problems: MDD recurrent severe with psychosis GAD PTSD   PLAN: Safety and Monitoring:             --  VOLUNTARY  admission to inpatient psychiatric unit for  safety, stabilization and treatment             -- Daily contact with patient to assess and evaluate symptoms and progress in treatment             -- Patient's case to be discussed in multi-disciplinary team meeting             -- Observation Level : q15 minute checks             -- Vital signs:  q12 hours             -- Precautions: suicide, elopement, and assault   2. Psychiatric Diagnoses and Treatment:  Prozac discontinued  on 9/22 Continue Effexor 75 mg daily (started on 9/22) Continue Remeron 15 mg nightly Continue abilify 10mg  daily for psychosis   -- The risks/benefits/side-effects/alternatives to this medication were discussed in detail with the patient and time was given for questions. The patient consents to medication trial.              -- Metabolic profile and EKG monitoring obtained while on an atypical antipsychotic  BMI: 54.28 kg/m2  Lipid Panel: WNL except Trig: 181, TSH: 5.182  BMP showing serum creatinine 1.4 downtrending, GFR 41, BUN 51, hyperglycemic 186.  CBC hemoglobin 9.8,             -- Encouraged patient to participate in unit milieu and in scheduled group therapies              -- Short Term Goals: Ability to identify changes in lifestyle to reduce recurrence of condition will improve and Ability to verbalize feelings will improve             -- Long Term Goals: Improvement in symptoms so as ready for discharge Other PRNS: tylenol, Maalox/Mylanta, milk of magnesia, agitation (benadryl PO/IM, haldol PO/IM, ativan PO/IM)              3. 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 Will monitor BMP prior to discharge (on Friday) Will require PCP follow-up appointment at discharge with nephrology consultation.   #Essential hypertension  Continue Losartan 100 mg daily  Continue Amlodipine 10 mg daily   #T2DM (A1C 11%, uncontrolled) CBGs in 150s Continue mSSI Continue semglee 20U at bedtime  Add SSI at bedtime  Add 4U novolog with meals  Hold metformin due to CKD   #Diabetic polyneuropathy associated with type 2 diabetes mellitus  Continue pregabalin 100 mg PO BID, can consider increasing Will obtain compression stockings given BLE swelling   #Normocytic anemia  Per hospitalist team, continue B12 and folate supplements.  No need to monitor while at [facility].  #HLD Continue home lipitor 80 mg daily  #Morbid Obesity Per medical team, lifestyle modification and weight loss as outpatient   4. Discharge Planning:              -- Social work and case management to assist with discharge planning and identification of hospital follow-up needs prior to discharge             -- Estimated LOS: 5-7 days             -- Discharge Concerns: Need to establish a safety plan; Medication compliance and effectiveness -Discussed with SW setting up patient with PCP appointment and nephrology             -- Discharge Goals: Return home with outpatient referrals for mental health follow-up including medication management/psychotherapy    I certify that inpatient services furnished can reasonably be expected to improve the patient's condition.   This note was created using a voice recognition software as a result there may be grammatical errors inadvertently enclosed that do not reflect the nature of this encounter. Every attempt is made to correct such  errors.   Dr. Karie Fetch, MD PGY-2, Psychiatry Residency  9/26/20249:27 AM

## 2023-08-10 LAB — BASIC METABOLIC PANEL
Anion gap: 7 (ref 5–15)
BUN: 68 mg/dL — ABNORMAL HIGH (ref 6–20)
CO2: 24 mmol/L (ref 22–32)
Calcium: 8.4 mg/dL — ABNORMAL LOW (ref 8.9–10.3)
Chloride: 106 mmol/L (ref 98–111)
Creatinine, Ser: 1.84 mg/dL — ABNORMAL HIGH (ref 0.44–1.00)
GFR, Estimated: 32 mL/min — ABNORMAL LOW (ref >60)
Glucose, Bld: 133 mg/dL — ABNORMAL HIGH (ref 70–99)
Potassium: 4.9 mmol/L (ref 3.5–5.1)
Sodium: 137 mmol/L (ref 135–145)

## 2023-08-10 LAB — GLUCOSE, CAPILLARY: Glucose-Capillary: 123 mg/dL — ABNORMAL HIGH (ref 70–99)

## 2023-08-10 MED ORDER — VENLAFAXINE HCL ER 75 MG PO CP24: 75.0000 mg | ORAL_CAPSULE | Freq: Every day | ORAL | 0 refills | Status: DC

## 2023-08-10 MED ORDER — ASPIRIN 81 MG PO TBEC: 81.0000 mg | DELAYED_RELEASE_TABLET | Freq: Every day | ORAL | 0 refills | Status: DC

## 2023-08-10 MED ORDER — TRAZODONE HCL 50 MG PO TABS: 50.0000 mg | ORAL_TABLET | Freq: Every evening | ORAL | 0 refills | Status: DC | PRN

## 2023-08-10 MED ORDER — POTASSIUM CHLORIDE CRYS ER 10 MEQ PO TBCR: 10.0000 meq | EXTENDED_RELEASE_TABLET | Freq: Every day | ORAL | 0 refills | Status: AC

## 2023-08-10 MED ORDER — MIRTAZAPINE 15 MG PO TABS: 15.0000 mg | ORAL_TABLET | Freq: Every day | ORAL | 0 refills | Status: DC

## 2023-08-10 MED ORDER — HYDROXYZINE HCL 25 MG PO TABS: 25.0000 mg | ORAL_TABLET | Freq: Three times a day (TID) | ORAL | 0 refills | Status: DC | PRN

## 2023-08-10 MED ORDER — ARIPIPRAZOLE 10 MG PO TABS: 10.0000 mg | ORAL_TABLET | Freq: Every day | ORAL | 0 refills | Status: AC

## 2023-08-10 MED ORDER — PREGABALIN 100 MG PO CAPS: 100.0000 mg | ORAL_CAPSULE | Freq: Two times a day (BID) | ORAL | 0 refills | Status: AC

## 2023-08-10 MED ORDER — ATORVASTATIN CALCIUM 80 MG PO TABS: 80.0000 mg | ORAL_TABLET | Freq: Every day | ORAL | 0 refills | Status: AC

## 2023-08-10 MED ORDER — LOSARTAN POTASSIUM 100 MG PO TABS: 100.0000 mg | ORAL_TABLET | Freq: Every day | ORAL | 0 refills | Status: AC

## 2023-08-10 MED ORDER — AMLODIPINE BESYLATE 10 MG PO TABS: 10.0000 mg | ORAL_TABLET | Freq: Every day | ORAL | 0 refills | Status: AC

## 2023-08-10 MED ORDER — TORSEMIDE 20 MG PO TABS: 20.0000 mg | ORAL_TABLET | Freq: Every day | ORAL | 0 refills | Status: DC

## 2023-08-10 MED ORDER — GLUCERNA SHAKE PO LIQD: 237.0000 mL | Freq: Three times a day (TID) | ORAL | 0 refills | Status: DC

## 2023-08-10 MED ORDER — NICOTINE 14 MG/24HR TD PT24: 14.0000 mg | MEDICATED_PATCH | Freq: Every day | TRANSDERMAL | 0 refills | Status: DC

## 2023-08-10 NOTE — Discharge Summary (Signed)
Physician Discharge Summary Note  Patient:  Hailey Klein is an 57 y.o., female MRN:  [id number] DOB:  [birth year] Patient phone:  [phone] (home)  Patient address:   [address] [place] [address],   Date of Admission:  08/03/2023 Date of Discharge: 08/10/23  Reason for Admission:  Hailey Klein is a 57 y.o. female  with a past psychiatric history of depression, anxiety, PTSD, and substance abuse (Cocaine, Hx of EtOH), and 4 Suicide Attempts (last- [facility] 2016) and 3 Prior Psychiatric Hospitalizations. Patient was recently discharged from [facility] on 9/18 and transferred to [facility], admitted to the medical floor (9/18-9/20) due to elevated BUN and Scr, with concerns of an acute kidney injury. She has now been re-admitted to [facility] on 08/03/2023 for management of worsening depression in setting of auditory hallucinations. 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   Principal Problem: MDD (major depressive disorder), recurrent, severe, with psychosis (HCC) Discharge Diagnoses: Principal Problem:   MDD (major depressive disorder), recurrent, severe, with psychosis (HCC) Active Problems:   PTSD (post-traumatic stress disorder)   GAD (generalized anxiety disorder)  Past Psychiatric History: Depression, Anxiety, PTSD, and Substance Abuse (Cocaine, Hx of EtOH), and 4 Suicide Attempts (last- [facility] 2016) and 3 Prior Psychiatric Hospitalizations (last- [facility] 11/2015), and no history of Self Injurious Behavior.   Past Medical History:  Past Medical History:  Diagnosis Date   Chronic kidney disease    Diabetes mellitus without complication (HCC)    Dyspnea    H/O suicide attempt    cut wrists - 19-y-o   High cholesterol    Hypertension    Neuropathy    PTSD (post-traumatic stress disorder)     Past Surgical History:  Procedure Laterality Date   LEFT HEART CATH AND CORONARY ANGIOGRAPHY N/A 02/02/2023   Procedure:  LEFT HEART CATH AND CORONARY ANGIOGRAPHY;  Surgeon: Tonny Bollman, MD;  Location: [facility] INVASIVE CV LAB;  Service: Cardiovascular;  Laterality: N/A;   Family History:  Family History  Problem Relation Age of Onset   Alcoholism Other    Family Psychiatric  History: No Known Diagnosis', Substance Abuse, or Suicides   Social History:  Social History   Substance and Sexual Activity  Alcohol Use Not Currently     Social History   Substance and Sexual Activity  Drug Use Yes   Types: Marijuana, Cocaine   Comment: using cocaine ''    Social History   Socioeconomic History   Marital status: Single    Spouse name: Not on file   Number of children: 0   Years of education: Not on file   Highest education level: High school graduate  Occupational History   Occupation: Disabilty  Tobacco Use   Smoking status: Every Day    Current packs/day: 1.00    Average packs/day: 1 pack/day for 0.7 years (0.7 ttl pk-yrs)    Types: Cigarettes    Start date: 2024   Smokeless tobacco: Not on file  Vaping Use   Vaping status: Never Used  Substance and Sexual Activity   Alcohol use: Not Currently   Drug use: Yes    Types: Marijuana, Cocaine    Comment: using cocaine ''   Sexual activity: Yes  Other Topics Concern   Not on file  Social History Narrative   Not on file   Social Determinants of Health   Financial Resource Strain: Medium Risk (07/11/2022)   Overall Financial Resource  Strain (CARDIA)    Difficulty of Paying Living Expenses: Somewhat hard  Food Insecurity: No Food Insecurity (08/03/2023)   Hunger Vital Sign    Worried About Running Out of Food in the Last Year: Never true    Ran Out of Food in the Last Year: Never true  Recent Concern: Food Insecurity - Food Insecurity Present (08/01/2023)   Hunger Vital Sign    Worried About Running Out of Food in the Last Year: Sometimes true    Ran Out of Food in the Last Year: Sometimes true  Transportation Needs: No Transportation Needs  (08/03/2023)   PRAPARE - Administrator, Civil Service (Medical): No    Lack of Transportation (Non-Medical): No  Recent Concern: Transportation Needs - Unmet Transportation Needs (08/01/2023)   PRAPARE - Administrator, Civil Service (Medical): Yes    Lack of Transportation (Non-Medical): Yes  Physical Activity: Not on file  Stress: Not on file  Social Connections: Not on file    Hospital Course:   During the patient's hospitalization, patient had extensive initial psychiatric evaluation, and follow-up psychiatric evaluations every day.   Psychiatric diagnoses provided upon initial assessment:  MDD recurrent severe with psychosis GAD PTSD   Patient's psychiatric medications were adjusted on admission:  Starting Effexor 37.5 mg daily on 9/22 D/c prozac today Continue Remeron 7.5 mg nightly Continue abilify 5mg  daily    During the hospitalization, other adjustments were made to the patient's psychiatric medication regimen:  - Increase effexor 75mg  daily for depression and anxiety  - Increase remeron 15mg  nightly for sleep and depression - Increase abilify 10mg  daily for auditory hallucinations    Patient's care was discussed during the interdisciplinary team meeting every day during the hospitalization.   The patient denied having side effects to prescribed psychiatric medication.   Gradually, patient started adjusting to milieu. The patient was evaluated each day by a clinical provider to ascertain response to treatment. Improvement was noted by the patient's report of decreasing symptoms, improved sleep and appetite, affect, medication tolerance, behavior, and participation in unit programming.  Patient was asked each day to complete a self inventory noting mood, mental status, pain, new symptoms, anxiety and concerns.     Symptoms were reported as significantly decreased or resolved completely by discharge.    On day of discharge, the patient reports that  their mood is stable. The patient denied having suicidal thoughts for more than 48 hours prior to discharge.  Patient denies having homicidal thoughts. Patient denies any visual hallucinations or other symptoms of psychosis. The patient was motivated to continue taking medication with a goal of continued improvement in mental health. Patient reports feeling depressed about leaving but ready to go sleep, watch TV, and see her boyfriend. Reports good sleep last night. Reports no changes in appetite. Denies any new physical symptoms. Still hearing voice of sister, reports it's less than compared to admission. Denies any noticeable side effects to medicine. If have thoughts of hurting self again, reports could tell boyfriend, call 911, [phone].   Received PRN atarax, trazodone overnight. Slept 6.5 hours.    The patient reports their target psychiatric symptoms of depression and auditory hallucinations responded well to the psychiatric medications, and the patient reports overall benefit other psychiatric hospitalization. Supportive psychotherapy was provided to the patient. The patient also participated in regular group therapy while hospitalized. Coping skills, problem solving as well as relaxation therapies were also part of the unit programming.   Labs were  reviewed with the patient, and abnormal results were discussed with the patient.   The patient is able to verbalize their individual safety plan to this provider.   Behavioral Events: none  # It is recommended to the patient to continue psychiatric medications as prescribed, after discharge from the hospital.    # It is recommended to the patient to follow up with your outpatient psychiatric provider and PCP.  # It was discussed with the patient, the impact of alcohol, drugs, tobacco have been there overall psychiatric and medical wellbeing, and total abstinence from substance use was recommended to the patient.  # Prescriptions provided or sent  directly to preferred pharmacy at discharge. Patient agreeable to plan. Given opportunity to ask questions. Appears to feel comfortable with discharge.    # In the event of worsening symptoms, the patient is instructed to call the crisis hotline, 911 and or go to the nearest ED for appropriate evaluation and treatment of symptoms. To follow-up with primary care provider for other medical issues, concerns and or health care needs  # Patient was discharged to a hotel with boyfriend with a plan to follow up as noted below.   Physical Findings: AIMS:  , ,  ,  ,    CIWA:    COWS:     Facial and Oral Movements Muscles of Facial Expression: None, normal Lips and Perioral Area: None, normal Jaw: None, normal Tongue: None, normal,Extremity Movements Upper (arms, wrists, hands, fingers): None, normal Lower (legs, knees, ankles, toes): None, normal Trunk Movements: None, normal  Neck, shoulders, hips: None, normal Overall Severity Severity of abnormal movements (highest score from questions above): None, normal Incapacitation due to abnormal movements: None, normal Patient's awareness of abnormal movements (rate only patient's report): No Awareness, Dental Status Current problems with teeth and/or dentures?: No Does patient usually wear dentures?: No   No stiffness, cogwheeling, or tremors noted on exam.   Musculoskeletal: Strength & Muscle Tone: within normal limits Gait & Station: shuffle Patient leans: N/A  Psychiatric Specialty Exam Presentation  General Appearance: Appropriate for Environment  Eye Contact:Fair  Speech:Clear and Coherent  Speech Volume:Normal  Handedness:Right   Mood and Affect  Mood:-- (Anxious but ready for discharge)  Affect:Appropriate   Thought Process  Thought Processes:Coherent; Goal Directed  Descriptions of Associations:Intact  Orientation:Full (Time, Place and Person)  Thought Content:Logical  History of Schizophrenia/Schizoaffective  disorder:No  Duration of Psychotic Symptoms: <6 months  Hallucinations:Hallucinations: Auditory Description of Auditory Hallucinations: hearing voice of sister, but decreased  Ideas of Reference:None  Suicidal Thoughts:Suicidal Thoughts: No  Homicidal Thoughts:Homicidal Thoughts: No   Sensorium  Memory:Immediate Fair; Recent Fair  Judgment:Fair  Insight:Fair   Executive Functions  Concentration:Fair  Attention Span:Fair  Recall:Fair  Fund of Knowledge:Fair  Language:Fair   Psychomotor Activity  Psychomotor Activity:Psychomotor Activity: Decreased   Assets  Assets:Communication Skills; Desire for Improvement; Housing; Intimacy   Sleep  Sleep:Sleep: Fair Number of Hours of Sleep: 6.5    Assets  Assets:Communication Skills; Desire for Improvement; Housing; Intimacy   Physical Exam ROS Physical Exam Constitutional:      Appearance: the patient is not toxic-appearing.  Pulmonary:     Effort: Pulmonary effort is normal.  Neurological:     General: No focal deficit present.     Mental Status: the patient is alert and oriented to person, place, and time.   Review of Systems  Respiratory:  Negative for shortness of breath.   Cardiovascular:  Negative for chest pain.  Gastrointestinal:  Negative  for abdominal pain, constipation, diarrhea, nausea and vomiting.  Neurological:  Negative for headaches.   Blood pressure 125/75, pulse 93, temperature 98.4 F (36.9 C), temperature source Oral, resp. rate 13, height 5\' 3"  (1.6 m), weight (!) 139 kg, last menstrual period 07/21/2015, SpO2 100%. Body mass index is 54.28 kg/m.  Social History   Tobacco Use  Smoking Status Every Day   Current packs/day: 1.00   Average packs/day: 1 pack/day for 0.7 years (0.7 ttl pk-yrs)   Types: Cigarettes   Start date: 2024  Smokeless Tobacco Not on file   Tobacco Cessation:  A prescription for an FDA-approved tobacco cessation medication provided at discharge  Blood  Alcohol level:  Lab Results  Component Value Date   [facility] <10 07/30/2023   ETH <5 11/29/2015    Metabolic Disorder Labs:  Lab Results  Component Value Date   HGBA1C 11.0 (H) 07/30/2023   MPG 269 07/30/2023   MPG 171 02/02/2023   Lab Results  Component Value Date   PROLACTIN 10.4 07/30/2023   PROLACTIN 55.7 (H) 12/01/2015   Lab Results  Component Value Date   CHOL 175 07/30/2023   TRIG 181 (H) 07/30/2023   HDL 41 07/30/2023   CHOLHDL 4.3 07/30/2023   VLDL 36 07/30/2023   LDLCALC 98 07/30/2023   LDLCALC 49 02/02/2023    Discharge destination:  Other:  Hotel  Is patient on multiple antipsychotic therapies at discharge:  No   Has Patient had three or more failed trials of antipsychotic monotherapy by history:  No  Recommended Plan for Multiple Antipsychotic Therapies: NA  Discharge Instructions     Call MD for:  difficulty breathing, headache or visual disturbances   Complete by: As directed    Call MD for:  extreme fatigue   Complete by: As directed    Call MD for:  hives   Complete by: As directed    Call MD for:  persistant dizziness or light-headedness   Complete by: As directed    Call MD for:  persistant nausea and vomiting   Complete by: As directed    Call MD for:  redness, tenderness, or signs of infection (pain, swelling, redness, odor or green/yellow discharge around incision site)   Complete by: As directed    Call MD for:  severe uncontrolled pain   Complete by: As directed    Call MD for:  temperature >100.4   Complete by: As directed    Diet - low sodium heart healthy   Complete by: As directed    Increase activity slowly   Complete by: As directed       Allergies as of 08/10/2023       Reactions   Aspirin Rash, Other (See Comments)   high doses ago        Medication List     STOP taking these medications    FLUoxetine 40 MG capsule Commonly known as: PROZAC       TAKE these medications      Indication  albuterol 108 (90  Base) MCG/ACT inhaler Commonly known as: VENTOLIN HFA Inhale 2 puffs into the lungs every 4 (four) hours as needed for wheezing or shortness of breath.  Indication: Chronic Obstructive Lung Disease   amLODipine 10 MG tablet Commonly known as: NORVASC Take 1 tablet (10 mg total) by mouth daily.  Indication: High Blood Pressure   ARIPiprazole 10 MG tablet Commonly known as: ABILIFY Take 1 tablet (10 mg total) by mouth daily. What changed:  medication strength how much to take  Indication: Major Depressive Disorder   aspirin EC 81 MG tablet Take 1 tablet (81 mg total) by mouth daily. Swallow whole.  Indication: Secondary prevention   atorvastatin 80 MG tablet Commonly known as: LIPITOR Take 1 tablet (80 mg total) by mouth daily.  Indication: High Amount of Fats in the Blood   Bydureon BCise 2 MG/0.85ML Auij Generic drug: Exenatide ER Inject 2 mg into the skin every Tuesday.  Indication: Type 2 Diabetes   cyanocobalamin 500 MCG tablet Commonly known as: VITAMIN B12 Take 1 tablet (500 mcg total) by mouth daily.  Indication: Inadequate Vitamin B12   diphenhydramine-acetaminophen 25-500 MG Tabs tablet Commonly known as: TYLENOL PM Take 1 tablet by mouth at bedtime as needed (For sleep or pain).  Indication: Mild Pain   feeding supplement (GLUCERNA SHAKE) Liqd Take 237 mLs by mouth 3 (three) times daily between meals.  Indication: Nutrition   fluticasone 50 MCG/ACT nasal spray Commonly known as: FLONASE Place 1 spray into both nostrils daily as needed for allergies or rhinitis.  Indication: Stuffy Nose   folic acid 1 MG tablet Commonly known as: FOLVITE Take 1 tablet (1 mg total) by mouth daily.  Indication: Anemia From Inadequate Folic Acid   hydrOXYzine 25 MG tablet Commonly known as: ATARAX Take 1 tablet (25 mg total) by mouth 3 (three) times daily as needed for anxiety.  Indication: Feeling Anxious   Lantus SoloStar 100 UNIT/ML Solostar Pen Generic drug:  insulin glargine Inject 28 Units into the skin at bedtime.  Indication: Type 2 Diabetes   losartan 100 MG tablet Commonly known as: COZAAR Take 1 tablet (100 mg total) by mouth daily.  Indication: High Blood Pressure   mirtazapine 15 MG tablet Commonly known as: REMERON Take 1 tablet (15 mg total) by mouth at bedtime.  Indication: Major Depressive Disorder   montelukast 10 MG tablet Commonly known as: SINGULAIR Take 10 mg by mouth at bedtime.  Indication: Hayfever   naphazoline-glycerin 0.012-0.25 % Soln Commonly known as: CLEAR EYES REDNESS Place 2 drops into both eyes 4 (four) times daily as needed (For allergies).  Indication: eye redness   nicotine 14 mg/24hr patch Commonly known as: NICODERM CQ - dosed in mg/24 hours Place 1 patch (14 mg total) onto the skin daily.  Indication: Nicotine Addiction   potassium chloride 10 MEQ tablet Commonly known as: KLOR-CON M Take 1 tablet (10 mEq total) by mouth daily.  Indication: Low Amount of Potassium in the Blood   pregabalin 100 MG capsule Commonly known as: LYRICA Take 1 capsule (100 mg total) by mouth 2 (two) times daily.  Indication: Neuropathic Pain   Symbicort 160-4.5 MCG/ACT inhaler Generic drug: budesonide-formoterol Inhale 2 puffs into the lungs 2 (two) times daily.  Indication: Chronic Obstructive Lung Disease   thiamine 100 MG tablet Commonly known as: VITAMIN B1 Take 100 mg by mouth 2 (two) times daily.  Indication: Deficiency of Vitamin B1   torsemide 20 MG tablet Commonly known as: DEMADEX Take 1 tablet (20 mg total) by mouth daily. What changed:  how much to take when to take this  Indication: High Blood Pressure   traZODone 50 MG tablet Commonly known as: DESYREL Take 1 tablet (50 mg total) by mouth at bedtime as needed for sleep.  Indication: Trouble Sleeping   venlafaxine XR 75 MG 24 hr capsule Commonly known as: EFFEXOR-XR Take 1 capsule (75 mg total) by mouth daily with breakfast.   Indication: Major Depressive Disorder  Follow-up Information     [facility] Follow up on 08/17/2023.   Why: You have a hospital follow up appointment for therapy and medication management services on 08/17/23 at 10:00 am. This appointment will be Virtual via telephone. Contact information: [address]  [address] [place] [address] [phone]         [facility]. Go on 08/30/2023.   Why: You have an appointment with this clinic for PCP services on 08/30/23 at 8:30 am.  This appointment will be held in person.  You will need to obtain a referral to a nephrologist. Contact information: [address] [place] [address] [phone]                Discharge recommendations:  Activity: as tolerated   Diet: heart healthy   # It is recommended to the patient to continue psychiatric medications as prescribed, after discharge from the hospital.     # It is recommended to the patient to follow up with your outpatient psychiatric provider -instructions on appointment date, time, and address (location) are provided to you in discharge paperwork   # Follow-up with outpatient primary care doctor and other specialists -for management of chronic medical disease, including:  -COPD -CKDIIIb -HTN -T2DM -Normocytic anemia   # Testing: Follow-up with outpatient provider for abnormal lab results:  -A1c 11 -Cr 1.84 -Hgb 8.8, Hct 28.4   # It was discussed with the patient, the impact of alcohol, drugs, tobacco have been there overall psychiatric and medical wellbeing, and total abstinence from substance use was recommended to the patient.   # Prescriptions provided or sent directly to preferred pharmacy at discharge. Patient agreeable to plan. Given opportunity to ask questions. Appears to feel comfortable with discharge.    # In the event of worsening symptoms, the patient is instructed to call the crisis hotline, 911 and or go  to the nearest ED for appropriate evaluation and treatment of symptoms. To follow-up with primary care provider for other medical issues, concerns and or health care needs  Patient agrees with D/C instructions and plan.   Signed: Karie Fetch, MD, PGY-2 08/10/2023, 2:18 PM

## 2023-08-10 NOTE — Progress Notes (Signed)
   08/10/23 0825  Psych Admission Type (Psych Patients Only)  Admission Status Voluntary  Psychosocial Assessment  Patient Complaints Depression  Eye Contact Fair  Facial Expression Flat  Affect Flat  Speech Logical/coherent  Interaction Assertive  Motor Activity Slow  Appearance/Hygiene In hospital gown  Behavior Characteristics Cooperative  Mood Depressed  Thought Process  Coherency WDL  Content WDL  Delusions WDL  Perception WDL  Hallucination None reported or observed  Judgment Poor  Confusion None  Danger to Self  Current suicidal ideation? Denies  Self-Injurious Behavior No self-injurious ideation or behavior indicators observed or expressed   Agreement Not to Harm Self Yes  Description of Agreement verbal  Danger to Others  Danger to Others None reported or observed

## 2023-08-10 NOTE — Progress Notes (Signed)
Pt discharged alert and oriented x 4 and cooperative. All belongings returned to pt. Discharge instructions reviewed with pt and pt verbalized understanding of discharge instructions.

## 2023-08-10 NOTE — Group Note (Signed)
Recreation Therapy Group Note   Group Topic:Leisure Education  Group Date: 08/10/2023 Start Time: 0930 End Time: 1000 Facilitators: Tynesia Harral-McCall, LRT,CTRS Location: [address] Day[address]   Group Topic: Leisure Education   Goal Area(s) Addresses:  Patient will successfully demonstrate knowledge of leisure and recreation interests. Patient will successfully identify benefits of leisure participation.  Patient will verbalize appropriate recreation activities to use post discharge.  Intervention: Music Trivia   Group Description: LRT facilitated a competitive group game to challenge patients knowledge of music. In teams, patients were asked to answer trivia questions that required patients to finish the lyric or guess the artist. The team with the most points at the end of the game won.   Education:  Leisure Education, Building control surveyor   Education Outcome: Acknowledges education   Affect/Mood: Appropriate   Participation Level: Active   Participation Quality: Independent   Behavior: Appropriate   Speech/Thought Process: Focused   Insight: Good   Judgement: Good   Modes of Intervention: Competitive Play   Patient Response to Interventions:  Engaged   Education Outcome:  In group clarification offered    Clinical Observations/Individualized Feedback: Pt engaged with peers in activity. Pt was appropriate and focused throughout group session.      Plan: Continue to engage patient in RT group sessions 2-3x/week.   Reem Fleury-McCall, LRT,CTRS 08/10/2023 12:19 PM

## 2023-08-10 NOTE — [facility] Suicide Risk Assessment (Signed)
[facility] Discharge Suicide Risk Assessment  Principal Problem: MDD (major depressive disorder), recurrent, severe, with psychosis (HCC) Discharge Diagnoses: Principal Problem:   MDD (major depressive disorder), recurrent, severe, with psychosis (HCC) Active Problems:   PTSD (post-traumatic stress disorder)   GAD (generalized anxiety disorder)  Reason for admission: Hailey Klein is a 57 y.o. female  with a past psychiatric history of depression, anxiety, PTSD, and substance abuse (Cocaine, Hx of EtOH), and 4 Suicide Attempts (last- [facility] 2016) and 3 Prior Psychiatric Hospitalizations. Patient was recently discharged from [facility] on 9/18 and transferred to [facility], admitted to the medical floor (9/18-9/20) due to elevated BUN and Scr, with concerns of an acute kidney injury. She has now been re-admitted to [facility] on 08/03/2023 for management of worsening depression in setting of auditory hallucinations. 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   PTA Medications:  Prozac 40mg  daily Abilify 5 mg daily   Hospital Course:   During the patient's hospitalization, patient had extensive initial psychiatric evaluation, and follow-up psychiatric evaluations every day.  Psychiatric diagnoses provided upon initial assessment:  MDD recurrent severe with psychosis GAD PTSD  Patient's psychiatric medications were adjusted on admission:  Starting Effexor 37.5 mg daily on 9/22 D/c prozac today Continue Remeron 7.5 mg nightly Continue abilify 5mg  daily   During the hospitalization, other adjustments were made to the patient's psychiatric medication regimen:  - Increase effexor 75mg  daily for depression and anxiety  - Increase remeron 15mg  nightly for sleep and depression - Increase abilify 10mg  daily for auditory hallucinations   Patient's care was discussed during the interdisciplinary team meeting every day during the hospitalization.  The  patient denied having side effects to prescribed psychiatric medication.  Gradually, patient started adjusting to milieu. The patient was evaluated each day by a clinical provider to ascertain response to treatment. Improvement was noted by the patient's report of decreasing symptoms, improved sleep and appetite, affect, medication tolerance, behavior, and participation in unit programming.  Patient was asked each day to complete a self inventory noting mood, mental status, pain, new symptoms, anxiety and concerns.    Symptoms were reported as significantly decreased or resolved completely by discharge.   On day of discharge, the patient reports that their mood is stable. The patient denied having suicidal thoughts for more than 48 hours prior to discharge.  Patient denies having homicidal thoughts. Patient denies any visual hallucinations or other symptoms of psychosis. The patient was motivated to continue taking medication with a goal of continued improvement in mental health. Patient reports feeling depressed about leaving but ready to go sleep, watch TV, and see her boyfriend. Reports good sleep last night. Reports no changes in appetite. Denies any new physical symptoms. Still hearing voice of sister, reports it's less than compared to admission. Denies any noticeable side effects to medicine. If have thoughts of hurting self again, reports could tell boyfriend, call 911, [phone].  Received PRN atarax, trazodone overnight. Slept 6.5 hours.   The patient reports their target psychiatric symptoms of depression and auditory hallucinations responded well to the psychiatric medications, and the patient reports overall benefit other psychiatric hospitalization. Supportive psychotherapy was provided to the patient. The patient also participated in regular group therapy while hospitalized. Coping skills, problem solving as well as relaxation therapies were also part of the unit programming.  Labs were reviewed  with the patient, and abnormal results were discussed with the patient.  The patient is  able to verbalize their individual safety plan to this provider.  Behavioral Events: none  Sleep  Sleep:Sleep: Fair Number of Hours of Sleep: 6.5   Musculoskeletal: Strength & Muscle Tone: within normal limits Gait & Station: shuffle Patient leans: N/A  Psychiatric Specialty Exam  Presentation  General Appearance:  Appropriate for Environment  Eye Contact: Fair  Speech: Clear and Coherent  Speech Volume: Normal  Handedness: Right   Mood and Affect  Mood: -- (Anxious but ready for discharge)  Affect: Appropriate   Thought Process  Thought Processes: Coherent; Goal Directed  Descriptions of Associations: Intact  Orientation: Full (Time, Place and Person)  Thought Content: Logical  History of Schizophrenia/Schizoaffective disorder: No  Duration of Psychotic Symptoms: <6 months Hallucinations: Hallucinations: Auditory Description of Auditory Hallucinations: hearing voice of sister, but decreased  Ideas of Reference: None  Suicidal Thoughts: Suicidal Thoughts: No  Homicidal Thoughts: Homicidal Thoughts: No   Sensorium  Memory: Immediate Fair; Recent Fair  Judgment: Fair  Insight: Fair   Chartered certified accountant: Fair  Attention Span: Fair  Recall: [employer] of Knowledge: Fair  Language: Fair   Psychomotor Activity  Psychomotor Activity: Psychomotor Activity: Decreased   Assets  Assets: Communication Skills; Desire for Improvement; Housing; Intimacy   Sleep  Sleep: Sleep: Fair Number of Hours of Sleep: 6.5    Assets  Assets: Communication Skills; Desire for Improvement; Housing; Intimacy   Physical Exam ROS Physical Exam Constitutional:      Appearance: the patient is not toxic-appearing.  Pulmonary:     Effort: Pulmonary effort is normal.  Neurological:     General: No focal deficit present.      Mental Status: the patient is alert and oriented to person, place, and time.   Review of Systems  Respiratory:  Negative for shortness of breath.   Cardiovascular:  Negative for chest pain.  Gastrointestinal:  Negative for abdominal pain, constipation, diarrhea, nausea and vomiting.  Neurological:  Negative for headaches.    Blood pressure (!) 141/73, pulse 96, temperature 98.4 F (36.9 C), temperature source Oral, resp. rate 14, height 5\' 3"  (1.6 m), weight (!) 139 kg, last menstrual period 07/21/2015, SpO2 100%. Body mass index is 54.28 kg/m.  Mental Status Per Nursing Assessment::   On Admission:  Suicidal ideation indicated by patient  Demographic Factors:  Low socioeconomic status and Unemployed  Loss Factors: Financial problems/change in socioeconomic status  Historical Factors: Prior suicide attempts and Impulsivity  Risk Reduction Factors:   Living with another person, especially a relative and Positive social support  Continued Clinical Symptoms:  Depression  Cognitive Features That Contribute To Risk:  Thought constriction (tunnel vision)    Suicide Risk:  Mild: There are no identifiable plans, no associated intent, mild dysphoria and related symptoms, good self-control (both objective and subjective assessment), few other risk factors, and identifiable protective factors, including available and accessible social support.   Follow-up Information     [facility] Follow up on 08/17/2023.   Why: You have a hospital follow up appointment for therapy and medication management services on 08/17/23 at 10:00 am. This appointment will be Virtual via telephone. Contact information: [address]  [address] [place] [address] [phone]         [facility]. Go on 08/30/2023.   Why: You have an appointment with this clinic for PCP services on 08/30/23 at 8:30 am.  This appointment will be held in person.  You will need to obtain a  referral to a nephrologist. Contact information: [address] [place] [address] [phone]                Discharge recommendations:    Activity: as tolerated  Diet: heart healthy  # It is recommended to the patient to continue psychiatric medications as prescribed, after discharge from the hospital.     # It is recommended to the patient to follow up with your outpatient psychiatric provider -instructions on appointment date, time, and address (location) are provided to you in discharge paperwork  # Follow-up with outpatient primary care doctor and other specialists -for management of chronic medical disease, including:  -COPD -CKDIIIb -HTN -T2DM -Normocytic anemia  # Testing: Follow-up with outpatient provider for abnormal lab results:  -A1c 11 -Cr 1.84 -Hgb 8.8, Hct 28.4   # It was discussed with the patient, the impact of alcohol, drugs, tobacco have been there overall psychiatric and medical wellbeing, and total abstinence from substance use was recommended to the patient.   # Prescriptions provided or sent directly to preferred pharmacy at discharge. Patient agreeable to plan. Given opportunity to ask questions. Appears to feel comfortable with discharge.    # In the event of worsening symptoms, the patient is instructed to call the crisis hotline, 911 and or go to the nearest ED for appropriate evaluation and treatment of symptoms. To follow-up with primary care provider for other medical issues, concerns and or health care needs  Patient agrees with D/C instructions and plan.   Karie Fetch, MD, PGY-2 08/10/2023, 8:40 AM

## 2023-08-10 NOTE — Group Note (Unsigned)
Date:  08/10/2023 Time:  11:36 AM  Group Topic/Focus:  Goals Group:   The focus of this group is to help patients establish daily goals to achieve during treatment and discuss how the patient can incorporate goal setting into their daily lives to aide in recovery.     Participation Level:  {BHH PARTICIPATION AVWUJ:81191}  Participation Quality:  {B[facility] PARTICIPATION QUALITY:22265}  Affect:  {BHH AFFECT:22266}  Cognitive:  {BHH COGNITIVE:22267}  Insight: {BHH Insight2:20797}  Engagement in Group:  {BHH ENGAGEMENT IN YNWGN:56213}  Modes of Intervention:  {BHH MODES OF INTERVENTION:22269}  Additional Comments:  ***  Reymundo Poll 08/10/2023, 11:36 AM

## 2023-08-10 NOTE — Progress Notes (Signed)
   08/09/23 2231  Psych Admission Type (Psych Patients Only)  Admission Status Voluntary  Psychosocial Assessment  Patient Complaints Depression  Eye Contact Fair  Facial Expression Flat  Affect Flat  Speech Logical/coherent  Interaction Assertive  Motor Activity Slow  Appearance/Hygiene Disheveled;In hospital gown  Behavior Characteristics Cooperative  Mood Depressed  Thought Process  Coherency WDL  Content WDL  Delusions WDL  Perception WDL  Hallucination None reported or observed  Judgment Poor  Confusion None  Danger to Self  Current suicidal ideation? Denies  Self-Injurious Behavior No self-injurious ideation or behavior indicators observed or expressed   Agreement Not to Harm Self Yes  Description of Agreement verbal  Danger to Others  Danger to Others None reported or observed

## 2023-08-13 ENCOUNTER — Other Ambulatory Visit: Payer: Self-pay

## 2023-08-13 ENCOUNTER — Emergency Department (HOSPITAL_COMMUNITY): Payer: Medicaid Other

## 2023-08-13 ENCOUNTER — Encounter (HOSPITAL_COMMUNITY): Payer: Self-pay

## 2023-08-13 ENCOUNTER — Emergency Department (HOSPITAL_COMMUNITY)
Admission: EM | Admit: 2023-08-13 | Discharge: 2023-08-13 | Disposition: A | Discharge status: Home / Self Care | Payer: Medicaid Other | Attending: Emergency Medicine | Admitting: Emergency Medicine

## 2023-08-13 DIAGNOSIS — R7989 Other specified abnormal findings of blood chemistry: Secondary | ICD-10-CM | POA: Diagnosis not present

## 2023-08-13 DIAGNOSIS — Z79899 Other long term (current) drug therapy: Secondary | ICD-10-CM | POA: Diagnosis not present

## 2023-08-13 DIAGNOSIS — I5033 Acute on chronic diastolic (congestive) heart failure: Secondary | ICD-10-CM | POA: Diagnosis not present

## 2023-08-13 DIAGNOSIS — R0602 Shortness of breath: Secondary | ICD-10-CM | POA: Diagnosis present

## 2023-08-13 DIAGNOSIS — J181 Lobar pneumonia, unspecified organism: Secondary | ICD-10-CM | POA: Diagnosis not present

## 2023-08-13 DIAGNOSIS — Z794 Long term (current) use of insulin: Secondary | ICD-10-CM | POA: Insufficient documentation

## 2023-08-13 DIAGNOSIS — I130 Hypertensive heart and chronic kidney disease with heart failure and stage 1 through stage 4 chronic kidney disease, or unspecified chronic kidney disease: Secondary | ICD-10-CM | POA: Diagnosis not present

## 2023-08-13 DIAGNOSIS — N189 Chronic kidney disease, unspecified: Secondary | ICD-10-CM | POA: Diagnosis not present

## 2023-08-13 DIAGNOSIS — J189 Pneumonia, unspecified organism: Secondary | ICD-10-CM

## 2023-08-13 DIAGNOSIS — E1122 Type 2 diabetes mellitus with diabetic chronic kidney disease: Secondary | ICD-10-CM | POA: Diagnosis not present

## 2023-08-13 LAB — COMPREHENSIVE METABOLIC PANEL
ALT: 22 U/L (ref 0–44)
AST: 15 U/L (ref 15–41)
Albumin: 2.7 g/dL — ABNORMAL LOW (ref 3.5–5.0)
Alkaline Phosphatase: 104 U/L (ref 38–126)
Anion gap: 12 (ref 5–15)
BUN: 20 mg/dL (ref 6–20)
CO2: 22 mmol/L (ref 22–32)
Calcium: 8.5 mg/dL — ABNORMAL LOW (ref 8.9–10.3)
Chloride: 104 mmol/L (ref 98–111)
Creatinine, Ser: 1.26 mg/dL — ABNORMAL HIGH (ref 0.44–1.00)
GFR, Estimated: 50 mL/min — ABNORMAL LOW (ref >60)
Glucose, Bld: 299 mg/dL — ABNORMAL HIGH (ref 70–99)
Potassium: 3.9 mmol/L (ref 3.5–5.1)
Sodium: 138 mmol/L (ref 135–145)
Total Bilirubin: 0.6 mg/dL (ref 0.3–1.2)
Total Protein: 7.2 g/dL (ref 6.5–8.1)

## 2023-08-13 LAB — CBC WITH DIFFERENTIAL/PLATELET
Abs Immature Granulocytes: 0.06 10*3/uL (ref 0.00–0.07)
Basophils Absolute: 0.1 10*3/uL (ref 0.0–0.1)
Basophils Relative: 1 %
Eosinophils Absolute: 0.1 10*3/uL (ref 0.0–0.5)
Eosinophils Relative: 1 %
HCT: 27.3 % — ABNORMAL LOW (ref 36.0–46.0)
Hemoglobin: 8.4 g/dL — ABNORMAL LOW (ref 12.0–15.0)
Immature Granulocytes: 1 %
Lymphocytes Relative: 11 %
Lymphs Abs: 1.2 10*3/uL (ref 0.7–4.0)
MCH: 28.1 pg (ref 26.0–34.0)
MCHC: 30.8 g/dL (ref 30.0–36.0)
MCV: 91.3 fL (ref 80.0–100.0)
Monocytes Absolute: 0.7 10*3/uL (ref 0.1–1.0)
Monocytes Relative: 6 %
Neutro Abs: 9 10*3/uL — ABNORMAL HIGH (ref 1.7–7.7)
Neutrophils Relative %: 80 %
Platelets: 321 10*3/uL (ref 150–400)
RBC: 2.99 MIL/uL — ABNORMAL LOW (ref 3.87–5.11)
RDW: 12.9 % (ref 11.5–15.5)
WBC: 11 10*3/uL — ABNORMAL HIGH (ref 4.0–10.5)
nRBC: 0 % (ref 0.0–0.2)

## 2023-08-13 LAB — BRAIN NATRIURETIC PEPTIDE: B Natriuretic Peptide: 333.1 pg/mL — ABNORMAL HIGH (ref 0.0–100.0)

## 2023-08-13 LAB — TROPONIN I (HIGH SENSITIVITY)
Troponin I (High Sensitivity): 21 ng/L — ABNORMAL HIGH (ref <18)
Troponin I (High Sensitivity): 22 ng/L — ABNORMAL HIGH (ref <18)

## 2023-08-13 LAB — MAGNESIUM: Magnesium: 1.8 mg/dL (ref 1.7–2.4)

## 2023-08-13 MED ORDER — ONDANSETRON 4 MG PO TBDP: 4.0000 mg | ORAL_TABLET | Freq: Three times a day (TID) | ORAL | 0 refills | Status: DC | PRN

## 2023-08-13 MED ORDER — SODIUM CHLORIDE 0.9 % IV SOLN
500.0000 mg | Freq: Once | INTRAVENOUS | Status: AC
  Administered 2023-08-13: 500 mg via INTRAVENOUS
  Filled 2023-08-13: qty 5

## 2023-08-13 MED ORDER — ALUM & MAG HYDROXIDE-SIMETH 200-200-20 MG/5ML PO SUSP
30.0000 mL | Freq: Once | ORAL | Status: AC
  Administered 2023-08-13: 30 mL via ORAL
  Filled 2023-08-13: qty 30

## 2023-08-13 MED ORDER — AMOXICILLIN-POT CLAVULANATE 875-125 MG PO TABS: 1.0000 | ORAL_TABLET | Freq: Two times a day (BID) | ORAL | 0 refills | Status: DC

## 2023-08-13 MED ORDER — LIDOCAINE VISCOUS HCL 2 % MT SOLN
15.0000 mL | Freq: Once | OROMUCOSAL | Status: AC
  Administered 2023-08-13: 15 mL via ORAL
  Filled 2023-08-13: qty 15

## 2023-08-13 MED ORDER — SODIUM CHLORIDE 0.9 % IV SOLN
2.0000 g | Freq: Once | INTRAVENOUS | Status: AC
  Administered 2023-08-13: 2 g via INTRAVENOUS
  Filled 2023-08-13: qty 20

## 2023-08-13 MED ORDER — ACETAMINOPHEN 500 MG PO TABS
1000.0000 mg | ORAL_TABLET | Freq: Once | ORAL | Status: AC
  Administered 2023-08-13: 1000 mg via ORAL
  Filled 2023-08-13: qty 2

## 2023-08-13 MED ORDER — AZITHROMYCIN 250 MG PO TABS: ORAL_TABLET | ORAL | 0 refills | Status: DC

## 2023-08-13 NOTE — ED Provider Notes (Signed)
[facility]GENCY DEPARTMENT AT [facility] Provider Note  CSN: [id number] Arrival date & time: 08/13/23 0254  Chief Complaint(s) Shortness of Breath  HPI Hailey Klein is a 57 y.o. female with a past medical history listed below including hypertension diabetes who presents to the emergency department with several days of productive cough and chest pain.  Worse with coughing and palpation.  Patient also reports feeling short of breath when she lies on her right side.  Chest pain is a sharp stabbing pain in the center and right side of the chest.  Nonradiating.  Nonexertional.  Endorses shortness of breath that became worse yesterday evening around 10 PM.  EMS was called at that time and gave the patient a breathing treatment which the patient reports did improve the shortness of breath.  Shortness of breath returned after severe coughing spell that exacerbated the chest pain and prompted her visit.  Patient does have a history of cocaine use disorder and admitted to using cocaine 2 days ago.  The history is provided by the patient.    Past Medical History Past Medical History:  Diagnosis Date   Chronic kidney disease    Diabetes mellitus without complication (HCC)    Dyspnea    H/O suicide attempt    cut wrists - 19-y-o   High cholesterol    Hypertension    Neuropathy    PTSD (post-traumatic stress disorder)    Patient Active Problem List   Diagnosis Date Noted   MDD (major depressive disorder), recurrent episode, severe (HCC) 08/03/2023   Dyspepsia 08/01/2023   Migraines 08/01/2023   Normocytic anemia 08/01/2023   Moderate protein malnutrition (HCC) 08/01/2023   GAD (generalized anxiety disorder) 07/31/2023   Polysubstance abuse (HCC) 07/30/2023   Suicidal ideation 07/30/2023   Hypokalemia 02/02/2023   Hypomagnesemia 02/02/2023   Chest pain at rest 02/01/2023   Necrotizing soft tissue infection 08/30/2022   Renal vein thrombosis (HCC) 06/23/2022   Class 3  obesity (HCC) 06/22/2022   Acute on chronic diastolic CHF (congestive heart failure) (HCC) 06/20/2022   AKI (acute kidney injury) (HCC) 06/20/2022   Essential hypertension 06/20/2022   Type 2 diabetes mellitus with hyperlipidemia (HCC) 06/20/2022   Acute gout 06/19/2022   Restrictive lung disease secondary to obesity 03/22/2020   Acute venous embolism and thrombosis of deep vessels of proximal lower extremity (HCC) 09/10/2019   Diabetic polyneuropathy associated with type 2 diabetes mellitus (HCC) 09/10/2018   Class 3 severe obesity due to excess calories without serious comorbidity in adult (HCC) 05/03/2018   Tobacco abuse 01/08/2018   Chronic coronary artery disease 07/13/2017   NSTEMI (non-ST elevated myocardial infarction) (HCC) 04/30/2017   Alcohol use disorder, moderate, in sustained remission (HCC) 12/02/2015   Cocaine abuse (HCC) 12/02/2015   Opioid use disorder, moderate, in sustained remission (HCC) 12/02/2015   Cannabis use disorder, mild, abuse 12/02/2015   PTSD (post-traumatic stress disorder) 12/01/2015   MDD (major depressive disorder), recurrent, severe, with psychosis (HCC) 12/01/2015   Malingering 07/05/2015   Depression 07/03/2015   Home Medication(s) Prior to Admission medications   Medication Sig Start Date End Date Taking? Authorizing Provider  amoxicillin-clavulanate (AUGMENTIN) 875-125 MG tablet Take 1 tablet by mouth every 12 (twelve) hours. 08/13/23  Yes Jimmie Rueter, Amadeo Garnet, MD  azithromycin (ZITHROMAX Z-PAK) 250 MG tablet 500 mg already given in ER on day 1. Take 250 mg once a day for days 2, 3, 4, and 5. 08/14/23  Yes Bhavya Grand, Amadeo Garnet, MD  ondansetron (ZOFRAN-ODT) 4  MG disintegrating tablet Take 1 tablet (4 mg total) by mouth every 8 (eight) hours as needed for up to 3 days for nausea or vomiting. 08/13/23 08/16/23 Yes Irlene Crudup, Amadeo Garnet, MD  albuterol (VENTOLIN HFA) 108 (90 Base) MCG/ACT inhaler Inhale 2 puffs into the lungs every 4 (four) hours as  needed for wheezing or shortness of breath. 03/28/22   [provider]  amLODipine (NORVASC) 10 MG tablet Take 1 tablet (10 mg total) by mouth daily. 08/10/23 09/09/23  Karie Fetch, MD  ARIPiprazole (ABILIFY) 10 MG tablet Take 1 tablet (10 mg total) by mouth daily. 08/10/23 09/09/23  Karie Fetch, MD  aspirin EC 81 MG tablet Take 1 tablet (81 mg total) by mouth daily. Swallow whole. 08/10/23 09/09/23  Karie Fetch, MD  atorvastatin (LIPITOR) 80 MG tablet Take 1 tablet (80 mg total) by mouth daily. 08/10/23 09/09/23  Karie Fetch, MD  BYDUREON BCISE 2 MG/0.85ML AUIJ Inject 2 mg into the skin every Tuesday. 05/13/22   [provider]  cyanocobalamin (VITAMIN B12) 500 MCG tablet Take 1 tablet (500 mcg total) by mouth daily. 08/03/23   Hongalgi, Maximino Greenland, MD  diphenhydramine-acetaminophen (TYLENOL PM) 25-500 MG TABS tablet Take 1 tablet by mouth at bedtime as needed (For sleep or pain).    [provider]  feeding supplement, GLUCERNA SHAKE, (GLUCERNA SHAKE) LIQD Take 237 mLs by mouth 3 (three) times daily between meals. 08/10/23 09/09/23  Karie Fetch, MD  fluticasone [facility]) 50 MCG/ACT nasal spray Place 1 spray into both nostrils daily as needed for allergies or rhinitis. 05/13/22   [provider]  folic acid (FOLVITE) 1 MG tablet Take 1 tablet (1 mg total) by mouth daily. 08/03/23   Hongalgi, Maximino Greenland, MD  hydrOXYzine (ATARAX) 25 MG tablet Take 1 tablet (25 mg total) by mouth 3 (three) times daily as needed for anxiety. 08/10/23 09/09/23  Karie Fetch, MD  LANTUS SOLOSTAR 100 UNIT/ML Solostar Pen Inject 28 Units into the skin at bedtime. 05/13/22   [provider]  losartan (COZAAR) 100 MG tablet Take 1 tablet (100 mg total) by mouth daily. 08/10/23 09/09/23  Karie Fetch, MD  mirtazapine (REMERON) 15 MG tablet Take 1 tablet (15 mg total) by mouth at bedtime. 08/10/23 09/09/23  Karie Fetch, MD  montelukast (SINGULAIR) 10 MG tablet Take 10  mg by mouth at bedtime. 05/13/22   [provider]  naphazoline-glycerin (CLEAR EYES REDNESS) 0.012-0.25 % SOLN Place 2 drops into both eyes 4 (four) times daily as needed (For allergies).    [provider]  nicotine (NICODERM CQ - DOSED IN MG/24 HOURS) 14 mg/24hr patch Place 1 patch (14 mg total) onto the skin daily. 08/10/23 09/09/23  Karie Fetch, MD  potassium chloride (KLOR-CON M) 10 MEQ tablet Take 1 tablet (10 mEq total) by mouth daily. 08/10/23 09/09/23  Karie Fetch, MD  pregabalin (LYRICA) 100 MG capsule Take 1 capsule (100 mg total) by mouth 2 (two) times daily. 08/10/23 09/09/23  Karie Fetch, MD  SYMBICORT 160-4.5 MCG/ACT inhaler Inhale 2 puffs into the lungs 2 (two) times daily. 05/13/22   [provider]  thiamine (VITAMIN B1) 100 MG tablet Take 100 mg by mouth 2 (two) times daily. 05/13/22   [provider]  torsemide (DEMADEX) 20 MG tablet Take 1 tablet (20 mg total) by mouth daily. 08/10/23 09/09/23  Karie Fetch, MD  traZODone (DESYREL) 50 MG tablet Take 1 tablet (50 mg total) by mouth at bedtime as needed for sleep. 08/10/23 09/09/23  Standley Brooking,  Judeth Cornfield, MD  venlafaxine XR (EFFEXOR-XR) 75 MG 24 hr capsule Take 1 capsule (75 mg total) by mouth daily with breakfast. 08/10/23 09/09/23  Karie Fetch, MD                                                                                                                                    Allergies Aspirin  Review of Systems Review of Systems As noted in HPI  Physical Exam Vital Signs  I have reviewed the triage vital signs BP (!) 120/59   Pulse 82   Temp 98.1 F (36.7 C) (Oral)   Resp 18   Ht 5\' 3"  (1.6 m)   Wt (!) 154.1 kg   LMP 07/21/2015   SpO2 96%   BMI 60.18 kg/m   Physical Exam Vitals reviewed.  Constitutional:      General: She is not in acute distress.    Appearance: She is well-developed. She is not diaphoretic.  HENT:     Head: Normocephalic and atraumatic.      Nose: Nose normal.  Eyes:     General: No scleral icterus.       Right eye: No discharge.        Left eye: No discharge.     Conjunctiva/sclera: Conjunctivae normal.     Pupils: Pupils are equal, round, and reactive to light.  Cardiovascular:     Rate and Rhythm: Normal rate and regular rhythm.     Heart sounds: No murmur heard.    No friction rub. No gallop.  Pulmonary:     Effort: Pulmonary effort is normal. No respiratory distress.     Breath sounds: Normal breath sounds. No stridor. No rales.  Chest:     Chest wall: Tenderness present.    Abdominal:     General: There is no distension.     Palpations: Abdomen is soft.     Tenderness: There is no abdominal tenderness.  Musculoskeletal:        General: No tenderness.     Cervical back: Normal range of motion and neck supple.  Skin:    General: Skin is warm and dry.     Findings: No erythema or rash.  Neurological:     Mental Status: She is alert and oriented to person, place, and time.     ED Results and Treatments Labs (all labs ordered are listed, but only abnormal results are displayed) Labs Reviewed  COMPREHENSIVE METABOLIC PANEL - Abnormal; Notable for the following components:      Result Value   Glucose, Bld 299 (*)    Creatinine, Ser 1.26 (*)    Calcium 8.5 (*)    Albumin 2.7 (*)    GFR, Estimated 50 (*)    All other components within normal limits  CBC WITH DIFFERENTIAL/PLATELET - Abnormal; Notable for the following components:   WBC 11.0 (*)    RBC 2.99 (*)    Hemoglobin 8.4 (*)  HCT 27.3 (*)    Neutro Abs 9.0 (*)    All other components within normal limits  BRAIN NATRIURETIC PEPTIDE - Abnormal; Notable for the following components:   B Natriuretic Peptide 333.1 (*)    All other components within normal limits  TROPONIN I (HIGH SENSITIVITY) - Abnormal; Notable for the following components:   Troponin I (High Sensitivity) 21 (*)    All other components within normal limits  TROPONIN I (HIGH  SENSITIVITY) - Abnormal; Notable for the following components:   Troponin I (High Sensitivity) 22 (*)    All other components within normal limits  MAGNESIUM                                                                                                                         EKG  DG Chest Portable 1 View  Result Date: 08/13/2023 CLINICAL DATA:  Chest pain. EXAM: PORTABLE CHEST 1 VIEW COMPARISON:  08/01/2023. FINDINGS: The heart is enlarged and the mediastinal contour is within normal limits. Patchy airspace disease is present in the right upper lobe and in the infrahilar region on the right. No effusion or pneumothorax. No acute osseous abnormality. IMPRESSION: Patchy airspace disease in the right upper lobe in the infrahilar region on the right, possible pneumonia. Electronically Signed   By: Thornell Sartorius M.D.   On: 08/13/2023 03:40    Medications Ordered in ED Medications  acetaminophen (TYLENOL) tablet 1,000 mg (1,000 mg Oral Given 08/13/23 0330)  alum & mag hydroxide-simeth (MAALOX/MYLANTA) 200-200-20 MG/5ML suspension 30 mL (30 mLs Oral Given 08/13/23 0330)    And  lidocaine (XYLOCAINE) 2 % viscous mouth solution 15 mL (15 mLs Oral Given 08/13/23 0331)  cefTRIAXone (ROCEPHIN) 2 g in sodium chloride 0.9 % 100 mL IVPB (0 g Intravenous Stopped 08/13/23 0703)  azithromycin (ZITHROMAX) 500 mg in sodium chloride 0.9 % 250 mL IVPB (0 mg Intravenous Stopped 08/13/23 0703)   Procedures Procedures  (including critical care time) Medical Decision Making / ED Course   Medical Decision Making Amount and/or Complexity of Data Reviewed Labs: ordered. Decision-making details documented in ED Course. Radiology: ordered and independent interpretation performed. Decision-making details documented in ED Course. ECG/medicine tests: ordered and independent interpretation performed. Decision-making details documented in ED Course.  Risk OTC drugs. Prescription drug management. Decision regarding  hospitalization.    Workup and differential diagnosis is considered listed below.  Workup consistent with right upper lobe pneumonia.  Patient is not hypoxic nor in any respiratory distress.  Provided with first dose of antibiotics in the emergency department.  Felt stable for discharge home.  EKG without acute ischemic changes or evidence of pericarditis.  Serial troponins were flat.  Unlikely ACS. BNP is slightly elevated but presentation is not consistent with CHF exacerbation.  Likely secondary to pneumonia. CBC with leukocytosis.  Anemia with stable hemoglobin.  Metabolic panel without significant electrolyte derangements.  She does have hyperglycemia without evidence of DKA.  Mild renal insufficiency without AKI.  Patient  provided with oral pain meds which did provide significant relief.    Final Clinical Impression(s) / ED Diagnoses Final diagnoses:  Pneumonia of right upper lobe due to infectious organism   The patient appears reasonably screened and/or stabilized for discharge and I doubt any other medical condition or other [facility] requiring further screening, evaluation, or treatment in the ED at this time. I have discussed the findings, Dx and Tx plan with the patient/family who expressed understanding and agree(s) with the plan. Discharge instructions discussed at length. The patient/family was given strict return precautions who verbalized understanding of the instructions. No further questions at time of discharge.  Disposition: Discharge  Condition: Good  ED Discharge Orders          Ordered    azithromycin (ZITHROMAX Z-PAK) 250 MG tablet        08/13/23 0725    amoxicillin-clavulanate (AUGMENTIN) 875-125 MG tablet  Every 12 hours        08/13/23 0725    ondansetron (ZOFRAN-ODT) 4 MG disintegrating tablet  Every 8 hours PRN        08/13/23 0725            Follow Up: Primary care provider  Call  to schedule an appointment for close follow up    This chart  was dictated using voice recognition software.  Despite best efforts to proofread,  errors can occur which can change the documentation meaning.    Nira Conn, MD 08/13/23 [phone]

## 2023-08-13 NOTE — ED Triage Notes (Signed)
Patient Hailey Klein from home with complaint of SOB x 1 day.   Patient A & O x 4 on arrival  Patient also reports chest pain starting last night reports worse when laying down. Patient denies pain radiating or reproducible.

## 2023-08-15 ENCOUNTER — Emergency Department (HOSPITAL_BASED_OUTPATIENT_CLINIC_OR_DEPARTMENT_OTHER): Payer: Medicaid Other

## 2023-08-15 ENCOUNTER — Other Ambulatory Visit: Payer: Self-pay

## 2023-08-15 ENCOUNTER — Emergency Department (HOSPITAL_COMMUNITY): Payer: Medicaid Other

## 2023-08-15 ENCOUNTER — Encounter (HOSPITAL_COMMUNITY): Payer: Self-pay | Admitting: Internal Medicine

## 2023-08-15 ENCOUNTER — Encounter (HOSPITAL_COMMUNITY): Payer: Medicaid Other

## 2023-08-15 ENCOUNTER — Inpatient Hospital Stay (HOSPITAL_COMMUNITY)
Admission: EM | Admit: 2023-08-15 | Discharge: 2023-08-19 | DRG: 193 | Disposition: A | Discharge status: Home / Self Care | Payer: Medicaid Other | Attending: Internal Medicine | Admitting: Internal Medicine

## 2023-08-15 DIAGNOSIS — M79661 Pain in right lower leg: Secondary | ICD-10-CM | POA: Diagnosis not present

## 2023-08-15 DIAGNOSIS — R195 Other fecal abnormalities: Secondary | ICD-10-CM

## 2023-08-15 DIAGNOSIS — Z5982 Transportation insecurity: Secondary | ICD-10-CM

## 2023-08-15 DIAGNOSIS — Z9151 Personal history of suicidal behavior: Secondary | ICD-10-CM

## 2023-08-15 DIAGNOSIS — J189 Pneumonia, unspecified organism: Secondary | ICD-10-CM | POA: Diagnosis not present

## 2023-08-15 DIAGNOSIS — K921 Melena: Secondary | ICD-10-CM

## 2023-08-15 DIAGNOSIS — Z6841 Body Mass Index (BMI) 40.0 and over, adult: Secondary | ICD-10-CM

## 2023-08-15 DIAGNOSIS — Z5941 Food insecurity: Secondary | ICD-10-CM

## 2023-08-15 DIAGNOSIS — Z86711 Personal history of pulmonary embolism: Secondary | ICD-10-CM

## 2023-08-15 DIAGNOSIS — Z86718 Personal history of other venous thrombosis and embolism: Secondary | ICD-10-CM

## 2023-08-15 DIAGNOSIS — F329 Major depressive disorder, single episode, unspecified: Secondary | ICD-10-CM | POA: Diagnosis present

## 2023-08-15 DIAGNOSIS — Z811 Family history of alcohol abuse and dependence: Secondary | ICD-10-CM

## 2023-08-15 DIAGNOSIS — E538 Deficiency of other specified B group vitamins: Secondary | ICD-10-CM | POA: Diagnosis present

## 2023-08-15 DIAGNOSIS — R079 Chest pain, unspecified: Secondary | ICD-10-CM

## 2023-08-15 DIAGNOSIS — N1832 Chronic kidney disease, stage 3b: Secondary | ICD-10-CM | POA: Diagnosis present

## 2023-08-15 DIAGNOSIS — Z7982 Long term (current) use of aspirin: Secondary | ICD-10-CM

## 2023-08-15 DIAGNOSIS — F17210 Nicotine dependence, cigarettes, uncomplicated: Secondary | ICD-10-CM | POA: Diagnosis present

## 2023-08-15 DIAGNOSIS — F4310 Post-traumatic stress disorder, unspecified: Secondary | ICD-10-CM | POA: Diagnosis present

## 2023-08-15 DIAGNOSIS — D62 Acute posthemorrhagic anemia: Secondary | ICD-10-CM | POA: Diagnosis present

## 2023-08-15 DIAGNOSIS — K254 Chronic or unspecified gastric ulcer with hemorrhage: Secondary | ICD-10-CM

## 2023-08-15 DIAGNOSIS — E1165 Type 2 diabetes mellitus with hyperglycemia: Secondary | ICD-10-CM | POA: Diagnosis present

## 2023-08-15 DIAGNOSIS — Z886 Allergy status to analgesic agent status: Secondary | ICD-10-CM

## 2023-08-15 DIAGNOSIS — Z7951 Long term (current) use of inhaled steroids: Secondary | ICD-10-CM

## 2023-08-15 DIAGNOSIS — F1410 Cocaine abuse, uncomplicated: Secondary | ICD-10-CM | POA: Diagnosis present

## 2023-08-15 DIAGNOSIS — E1122 Type 2 diabetes mellitus with diabetic chronic kidney disease: Secondary | ICD-10-CM | POA: Diagnosis present

## 2023-08-15 DIAGNOSIS — I130 Hypertensive heart and chronic kidney disease with heart failure and stage 1 through stage 4 chronic kidney disease, or unspecified chronic kidney disease: Secondary | ICD-10-CM | POA: Diagnosis present

## 2023-08-15 DIAGNOSIS — Z794 Long term (current) use of insulin: Secondary | ICD-10-CM

## 2023-08-15 DIAGNOSIS — E7800 Pure hypercholesterolemia, unspecified: Secondary | ICD-10-CM | POA: Diagnosis present

## 2023-08-15 DIAGNOSIS — E1140 Type 2 diabetes mellitus with diabetic neuropathy, unspecified: Secondary | ICD-10-CM | POA: Diagnosis present

## 2023-08-15 DIAGNOSIS — Z79899 Other long term (current) drug therapy: Secondary | ICD-10-CM

## 2023-08-15 DIAGNOSIS — J9600 Acute respiratory failure, unspecified whether with hypoxia or hypercapnia: Secondary | ICD-10-CM | POA: Diagnosis present

## 2023-08-15 DIAGNOSIS — Z5986 Financial insecurity: Secondary | ICD-10-CM

## 2023-08-15 HISTORY — DX: Pneumonia, unspecified organism: J18.9

## 2023-08-15 LAB — HIV ANTIBODY (ROUTINE TESTING W REFLEX): HIV Screen 4th Generation wRfx: NONREACTIVE

## 2023-08-15 LAB — CBC
HCT: 23.9 % — ABNORMAL LOW (ref 36.0–46.0)
Hemoglobin: 7.5 g/dL — ABNORMAL LOW (ref 12.0–15.0)
MCH: 28.1 pg (ref 26.0–34.0)
MCHC: 31.4 g/dL (ref 30.0–36.0)
MCV: 89.5 fL (ref 80.0–100.0)
Platelets: 324 10*3/uL (ref 150–400)
RBC: 2.67 MIL/uL — ABNORMAL LOW (ref 3.87–5.11)
RDW: 12.8 % (ref 11.5–15.5)
WBC: 10.6 10*3/uL — ABNORMAL HIGH (ref 4.0–10.5)
nRBC: 0 % (ref 0.0–0.2)

## 2023-08-15 LAB — BASIC METABOLIC PANEL
Anion gap: 11 (ref 5–15)
BUN: 12 mg/dL (ref 6–20)
CO2: 21 mmol/L — ABNORMAL LOW (ref 22–32)
Calcium: 8.4 mg/dL — ABNORMAL LOW (ref 8.9–10.3)
Chloride: 103 mmol/L (ref 98–111)
Creatinine, Ser: 1.1 mg/dL — ABNORMAL HIGH (ref 0.44–1.00)
GFR, Estimated: 59 mL/min — ABNORMAL LOW (ref >60)
Glucose, Bld: 324 mg/dL — ABNORMAL HIGH (ref 70–99)
Potassium: 3.5 mmol/L (ref 3.5–5.1)
Sodium: 135 mmol/L (ref 135–145)

## 2023-08-15 LAB — TROPONIN I (HIGH SENSITIVITY)
Troponin I (High Sensitivity): 30 ng/L — ABNORMAL HIGH (ref <18)
Troponin I (High Sensitivity): 27 ng/L — ABNORMAL HIGH (ref <18)

## 2023-08-15 LAB — POC OCCULT BLOOD, ED: Fecal Occult Bld: NEGATIVE

## 2023-08-15 LAB — CREATININE, SERUM
Creatinine, Ser: 1.09 mg/dL — ABNORMAL HIGH (ref 0.44–1.00)
GFR, Estimated: 60 mL/min — ABNORMAL LOW (ref >60)

## 2023-08-15 MED ORDER — MIRTAZAPINE 15 MG PO TABS: 15.0000 mg | ORAL_TABLET | Freq: Every day | ORAL | Status: DC

## 2023-08-15 MED ORDER — AMLODIPINE BESYLATE 10 MG PO TABS
10.0000 mg | ORAL_TABLET | Freq: Every day | ORAL | Status: DC
  Administered 2023-08-15 – 2023-08-19 (×5): 10 mg via ORAL
  Filled 2023-08-15 (×5): qty 2

## 2023-08-15 MED ORDER — SODIUM CHLORIDE 0.9 % IV SOLN
500.0000 mg | Freq: Once | INTRAVENOUS | Status: AC
  Administered 2023-08-15: 500 mg via INTRAVENOUS
  Filled 2023-08-15: qty 5

## 2023-08-15 MED ORDER — ARIPIPRAZOLE 5 MG PO TABS
10.0000 mg | ORAL_TABLET | Freq: Every day | ORAL | Status: DC
  Administered 2023-08-15 – 2023-08-19 (×5): 10 mg via ORAL
  Filled 2023-08-15: qty 1
  Filled 2023-08-15: qty 2
  Filled 2023-08-15: qty 1
  Filled 2023-08-15 (×2): qty 2
  Filled 2023-08-15: qty 1
  Filled 2023-08-15: qty 2
  Filled 2023-08-15: qty 1
  Filled 2023-08-15: qty 2
  Filled 2023-08-15: qty 1

## 2023-08-15 MED ORDER — HYDROXYZINE HCL 25 MG PO TABS
25.0000 mg | ORAL_TABLET | Freq: Three times a day (TID) | ORAL | Status: DC | PRN
  Administered 2023-08-17: 25 mg via ORAL
  Filled 2023-08-15: qty 1

## 2023-08-15 MED ORDER — IOHEXOL 350 MG/ML SOLN
75.0000 mL | Freq: Once | INTRAVENOUS | Status: AC | PRN
  Administered 2023-08-15: 75 mL via INTRAVENOUS

## 2023-08-15 MED ORDER — ATORVASTATIN CALCIUM 80 MG PO TABS
80.0000 mg | ORAL_TABLET | Freq: Every day | ORAL | Status: DC
  Administered 2023-08-15 – 2023-08-19 (×5): 80 mg via ORAL
  Filled 2023-08-15 (×5): qty 2

## 2023-08-15 MED ORDER — FLUTICASONE PROPIONATE 50 MCG/ACT NA SUSP: 1.0000 | Freq: Every day | NASAL | Status: DC | PRN

## 2023-08-15 MED ORDER — VENLAFAXINE HCL ER 75 MG PO CP24: 75.0000 mg | ORAL_CAPSULE | Freq: Every day | ORAL | Status: DC

## 2023-08-15 MED ORDER — IBUPROFEN 200 MG PO TABS
600.0000 mg | ORAL_TABLET | ORAL | Status: DC | PRN
  Administered 2023-08-15 – 2023-08-16 (×3): 600 mg via ORAL
  Filled 2023-08-15 (×3): qty 3

## 2023-08-15 MED ORDER — MOMETASONE FURO-FORMOTEROL FUM 200-5 MCG/ACT IN AERO
2.0000 | INHALATION_SPRAY | Freq: Two times a day (BID) | RESPIRATORY_TRACT | Status: DC
  Administered 2023-08-16 – 2023-08-19 (×7): 2 via RESPIRATORY_TRACT
  Filled 2023-08-15 (×2): qty 8.8

## 2023-08-15 MED ORDER — MONTELUKAST SODIUM 10 MG PO TABS
10.0000 mg | ORAL_TABLET | Freq: Every day | ORAL | Status: DC
  Administered 2023-08-15 – 2023-08-18 (×4): 10 mg via ORAL
  Filled 2023-08-15 (×4): qty 1

## 2023-08-15 MED ORDER — SODIUM CHLORIDE 0.9 % IV SOLN
1.0000 g | Freq: Once | INTRAVENOUS | Status: AC
  Administered 2023-08-15: 1 g via INTRAVENOUS
  Filled 2023-08-15: qty 10

## 2023-08-15 MED ORDER — SODIUM CHLORIDE 0.9 % IV SOLN
500.0000 mg | INTRAVENOUS | Status: DC
  Administered 2023-08-16 – 2023-08-17 (×2): 500 mg via INTRAVENOUS
  Filled 2023-08-15 (×2): qty 5

## 2023-08-15 MED ORDER — SODIUM CHLORIDE 0.9 % IV SOLN
2.0000 g | INTRAVENOUS | Status: DC
  Administered 2023-08-15 – 2023-08-18 (×4): 2 g via INTRAVENOUS
  Filled 2023-08-15 (×4): qty 20

## 2023-08-15 MED ORDER — IOHEXOL 350 MG/ML SOLN
65.0000 mL | Freq: Once | INTRAVENOUS | Status: AC | PRN
  Administered 2023-08-15: 65 mL via INTRAVENOUS

## 2023-08-15 MED ORDER — ENOXAPARIN SODIUM 80 MG/0.8ML IJ SOSY
0.5000 mg/kg | PREFILLED_SYRINGE | INTRAMUSCULAR | Status: DC
  Administered 2023-08-15: 77.5 mg via SUBCUTANEOUS
  Filled 2023-08-15: qty 0.78
  Filled 2023-08-15: qty 0.8

## 2023-08-15 MED ORDER — LOSARTAN POTASSIUM 50 MG PO TABS
100.0000 mg | ORAL_TABLET | Freq: Every day | ORAL | Status: DC
  Administered 2023-08-15 – 2023-08-19 (×5): 100 mg via ORAL
  Filled 2023-08-15 (×5): qty 2

## 2023-08-15 NOTE — H&P (Signed)
History and Physical    Hailey Klein YNW:[id number] DOB: 1966-02-15 DOA: 08/15/2023  PCP: Pcp, No   Chief Complaint: Shortness of breath/chest pain  HPI: Hailey Klein is a 57 y.o. 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 She has reported multiple times to our ED over the past week with worsening shortness of breath for previously treated pneumonia given worsening imaging and symptoms patient will be admitted for failure of outpatient therapy given her worsening pneumonia despite reported compliance with antibiotics. Hospitalist called to admit in the setting of failure of outpatient antibiotics with Augmentin/z-pack.  Of note patient reports being recently evicted from current residency and 'lost all my medication' at that time but still reports being compliant with antibiotics.  Assessment/Plan Active Problems:   Pneumonia   Acute respiratory failure without hypoxia secondary to multifocal/bilateral community acquired pneumonia with failure of outpatient therapy - Patient does not meet sepsis criteria - Imaging shows evolving pneumonia - Markedly dyspneic from her baseline - walking O2 screen q-shift - Reported failure of outpatient Augmentin/Z-pack  Acute PE ruled out - Less likely - personally reviewed CTA without any overt filling defects - formal read pending *Update -formal read confirms no filling defects/embolus  T2D uncontrolled w/ hyperglycemia A1C 11.0 07/2023 - continue sliding scale/hypoglycemic protocol  HLD HTN PTSD Depression Suicidal ideation history CKD3b **labs at baseline - continue home medications - no  changes to home therapy at this time unless otherwise specified  Illicit substance use/abuse - admits to cocaine use earlier this week  ?DVT/PT history(distant - not currently being treated) ?History of malingering per chart review - follow clinically  DVT prophylaxis: Continue lovenox  Code Status: Full  Family Communication: None present  Status is: Obs  Dispo: The patient is from: Home              Anticipated d/c is to: Home              Anticipated d/c date is: 24h              Patient currently not medically stable for discharge given respiratory distress - likely safe to DC in 24-48h pending respiratory status after IV antibiotics  Consultants:  None  Procedures:  None   Past Medical History:  Diagnosis Date   Chronic kidney disease    Diabetes mellitus without complication (HCC)    Dyspnea    H/O suicide attempt    cut wrists - 19-y-o   High cholesterol    Hypertension    Neuropathy    PTSD (post-traumatic stress disorder)     Past Surgical History:  Procedure Laterality Date   LEFT HEART CATH AND CORONARY ANGIOGRAPHY N/A 02/02/2023   Procedure: LEFT HEART CATH AND CORONARY ANGIOGRAPHY;  Surgeon: Tonny Bollman, MD;  Location: [facility] IN[facility];  Service: Cardiovascular;  Laterality: N/A;     reports that she has been smoking cigarettes. She started smoking about 9 months ago. She has a 0.8 pack-year smoking history. She does not have any smokeless tobacco history on file. She reports that she does not currently use alcohol. She reports current drug use. Drugs: Marijuana and Cocaine.  Allergies  Allergen Reactions   Aspirin Rash and Other (See Comments)  History and Physical    Hailey Klein YNW:[id number] DOB: 1966-02-15 DOA: 08/15/2023  PCP: Pcp, No   Chief Complaint: Shortness of breath/chest pain  HPI: Hailey Klein is a 57 y.o. 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 She has reported multiple times to our ED over the past week with worsening shortness of breath for previously treated pneumonia given worsening imaging and symptoms patient will be admitted for failure of outpatient therapy given her worsening pneumonia despite reported compliance with antibiotics. Hospitalist called to admit in the setting of failure of outpatient antibiotics with Augmentin/z-pack.  Of note patient reports being recently evicted from current residency and 'lost all my medication' at that time but still reports being compliant with antibiotics.  Assessment/Plan Active Problems:   Pneumonia   Acute respiratory failure without hypoxia secondary to multifocal/bilateral community acquired pneumonia with failure of outpatient therapy - Patient does not meet sepsis criteria - Imaging shows evolving pneumonia - Markedly dyspneic from her baseline - walking O2 screen q-shift - Reported failure of outpatient Augmentin/Z-pack  Acute PE ruled out - Less likely - personally reviewed CTA without any overt filling defects - formal read pending *Update -formal read confirms no filling defects/embolus  T2D uncontrolled w/ hyperglycemia A1C 11.0 07/2023 - continue sliding scale/hypoglycemic protocol  HLD HTN PTSD Depression Suicidal ideation history CKD3b **labs at baseline - continue home medications - no  changes to home therapy at this time unless otherwise specified  Illicit substance use/abuse - admits to cocaine use earlier this week  ?DVT/PT history(distant - not currently being treated) ?History of malingering per chart review - follow clinically  DVT prophylaxis: Continue lovenox  Code Status: Full  Family Communication: None present  Status is: Obs  Dispo: The patient is from: Home              Anticipated d/c is to: Home              Anticipated d/c date is: 24h              Patient currently not medically stable for discharge given respiratory distress - likely safe to DC in 24-48h pending respiratory status after IV antibiotics  Consultants:  None  Procedures:  None   Past Medical History:  Diagnosis Date   Chronic kidney disease    Diabetes mellitus without complication (HCC)    Dyspnea    H/O suicide attempt    cut wrists - 19-y-o   High cholesterol    Hypertension    Neuropathy    PTSD (post-traumatic stress disorder)     Past Surgical History:  Procedure Laterality Date   LEFT HEART CATH AND CORONARY ANGIOGRAPHY N/A 02/02/2023   Procedure: LEFT HEART CATH AND CORONARY ANGIOGRAPHY;  Surgeon: Tonny Bollman, MD;  Location: [facility] CV [facility];  Service: Cardiovascular;  Laterality: N/A;     reports that she has been smoking cigarettes. She started smoking about 9 months ago. She has a 0.8 pack-year smoking history. She does not have any smokeless tobacco history on file. She reports that she does not currently use alcohol. She reports current drug use. Drugs: Marijuana and Cocaine.  Allergies  Allergen Reactions   Aspirin Rash and Other (See Comments)  mouth 2 (two) times daily. 08/10/23 09/09/23  Karie Fetch, MD  SYMBICORT 160-4.5 MCG/ACT inhaler Inhale 2 puffs into the lungs 2 (two) times daily. 05/13/22   [provider]  thiamine (VITAMIN B1) 100 MG tablet Take 100 mg by mouth 2 (two) times daily. 05/13/22   [provider]  torsemide (DEMADEX) 20 MG tablet Take 1 tablet (20 mg total) by mouth daily. 08/10/23 09/09/23  Karie Fetch, MD  traZODone (DESYREL) 50 MG tablet Take 1 tablet (50 mg total) by mouth at bedtime as needed for sleep. 08/10/23 09/09/23  Karie Fetch, MD  venlafaxine XR (EFFEXOR-XR) 75 MG 24 hr capsule Take 1 capsule (75 mg total) by mouth daily with breakfast. 08/10/23 09/09/23  Karie Fetch, MD    Physical Exam: Vitals:   08/15/23 1430 08/15/23 1515 08/15/23 1530 08/15/23 1540  BP: (!) 155/78 (!) 152/82 (!) 161/88    Pulse: 81 84 81 80  Resp: 20 18 (!) 22 14  Temp:  98.8 F (37.1 C)    TempSrc:  Oral    SpO2: 100% 100% 98% 98%    Constitutional: NAD, calm, comfortable Vitals:   08/15/23 1430 08/15/23 1515 08/15/23 1530 08/15/23 1540  BP: (!) 155/78 (!) 152/82 (!) 161/88   Pulse: 81 84 81 80  Resp: 20 18 (!) 22 14  Temp:  98.8 F (37.1 C)    TempSrc:  Oral    SpO2: 100% 100% 98% 98%   General:  Pleasantly resting in bed, No acute distress. HEENT:  Normocephalic atraumatic.  Sclerae nonicteric, noninjected.  Extraocular movements intact bilaterally. Neck:  Without mass or deformity.  Trachea is midline. Lungs:  Scant R>L ronchi. Heart:  Regular rate and rhythm.  Without murmurs, rubs, or gallops. Abdomen:  Soft, nontender, nondistended.  Without guarding or rebound. Extremities: Without cyanosis, clubbing, edema, or obvious deformity. Vascular:  Dorsalis pedis and posterior tibial pulses palpable bilaterally. Skin:  Warm and dry, no erythema, no ulcerations.  Labs on Admission: I have personally reviewed following labs and imaging studies  CBC: Recent Labs  Lab 08/13/23 0326 08/15/23 1216  WBC 11.0* 10.6*  NEUTROABS 9.0*  --   HGB 8.4* 7.5*  HCT 27.3* 23.9*  MCV 91.3 89.5  PLT 321 324   Basic Metabolic Panel: Recent Labs  Lab 08/10/23 0629 08/13/23 0326 08/15/23 1216  NA 137 138 135  K 4.9 3.9 3.5  CL 106 104 103  CO2 24 22 21*  GLUCOSE 133* 299* 324*  BUN 68* 20 12  CREATININE 1.84* 1.26* 1.10*  CALCIUM 8.4* 8.5* 8.4*  MG  --  1.8  --    GFR: Estimated Creatinine Clearance: 84.9 mL/min (A) (by C-G formula based on SCr of 1.1 mg/dL (H)).  Liver Function Tests: Recent Labs  Lab 08/13/23 0326  AST 15  ALT 22  ALKPHOS 104  BILITOT 0.6  PROT 7.2  ALBUMIN 2.7*   CBG: Recent Labs  Lab 08/09/23 0554 08/09/23 1200 08/09/23 1704 08/09/23 2128 08/10/23 0552  GLUCAP 166* 189* 340* 307* 123*    Radiological Exams on Admission: VAS [place] LOWER EXTREMITY VENOUS  (DVT) (7a-7p)  Result Date: 08/15/2023  Lower Venous DVT Study Patient Name:  DARRIANA DEBOY  Date of Exam:   08/15/2023 Medical Rec #: [id number]        Accession #:    [id number] Date of Birth: 1965/12/13         Patient Gender: F Patient Age:   36 years Exam Location:  [facility]  History and Physical    Hailey Klein YNW:[id number] DOB: 1966-02-15 DOA: 08/15/2023  PCP: Pcp, No   Chief Complaint: Shortness of breath/chest pain  HPI: Hailey Klein is a 57 y.o. 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 She has reported multiple times to our ED over the past week with worsening shortness of breath for previously treated pneumonia given worsening imaging and symptoms patient will be admitted for failure of outpatient therapy given her worsening pneumonia despite reported compliance with antibiotics. Hospitalist called to admit in the setting of failure of outpatient antibiotics with Augmentin/z-pack.  Of note patient reports being recently evicted from current residency and 'lost all my medication' at that time but still reports being compliant with antibiotics.  Assessment/Plan Active Problems:   Pneumonia   Acute respiratory failure without hypoxia secondary to multifocal/bilateral community acquired pneumonia with failure of outpatient therapy - Patient does not meet sepsis criteria - Imaging shows evolving pneumonia - Markedly dyspneic from her baseline - walking O2 screen q-shift - Reported failure of outpatient Augmentin/Z-pack  Acute PE ruled out - Less likely - personally reviewed CTA without any overt filling defects - formal read pending *Update -formal read confirms no filling defects/embolus  T2D uncontrolled w/ hyperglycemia A1C 11.0 07/2023 - continue sliding scale/hypoglycemic protocol  HLD HTN PTSD Depression Suicidal ideation history CKD3b **labs at baseline - continue home medications - no  changes to home therapy at this time unless otherwise specified  Illicit substance use/abuse - admits to cocaine use earlier this week  ?DVT/PT history(distant - not currently being treated) ?History of malingering per chart review - follow clinically  DVT prophylaxis: Continue lovenox  Code Status: Full  Family Communication: None present  Status is: Obs  Dispo: The patient is from: Home              Anticipated d/c is to: Home              Anticipated d/c date is: 24h              Patient currently not medically stable for discharge given respiratory distress - likely safe to DC in 24-48h pending respiratory status after IV antibiotics  Consultants:  None  Procedures:  None   Past Medical History:  Diagnosis Date   Chronic kidney disease    Diabetes mellitus without complication (HCC)    Dyspnea    H/O suicide attempt    cut wrists - 19-y-o   High cholesterol    Hypertension    Neuropathy    PTSD (post-traumatic stress disorder)     Past Surgical History:  Procedure Laterality Date   LEFT HEART CATH AND CORONARY ANGIOGRAPHY N/A 02/02/2023   Procedure: LEFT HEART CATH AND CORONARY ANGIOGRAPHY;  Surgeon: Tonny Bollman, MD;  Location: [facility];  Service: Cardiovascular;  Laterality: N/A;     reports that she has been smoking cigarettes. She started smoking about 9 months ago. She has a 0.8 pack-year smoking history. She does not have any smokeless tobacco history on file. She reports that she does not currently use alcohol. She reports current drug use. Drugs: Marijuana and Cocaine.  Allergies  Allergen Reactions   Aspirin Rash and Other (See Comments)  History and Physical    Hailey Klein YNW:[id number] DOB: 1966-02-15 DOA: 08/15/2023  PCP: Pcp, No   Chief Complaint: Shortness of breath/chest pain  HPI: Hailey Klein is a 57 y.o. 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 She has reported multiple times to our ED over the past week with worsening shortness of breath for previously treated pneumonia given worsening imaging and symptoms patient will be admitted for failure of outpatient therapy given her worsening pneumonia despite reported compliance with antibiotics. Hospitalist called to admit in the setting of failure of outpatient antibiotics with Augmentin/z-pack.  Of note patient reports being recently evicted from current residency and 'lost all my medication' at that time but still reports being compliant with antibiotics.  Assessment/Plan Active Problems:   Pneumonia   Acute respiratory failure without hypoxia secondary to multifocal/bilateral community acquired pneumonia with failure of outpatient therapy - Patient does not meet sepsis criteria - Imaging shows evolving pneumonia - Markedly dyspneic from her baseline - walking O2 screen q-shift - Reported failure of outpatient Augmentin/Z-pack  Acute PE ruled out - Less likely - personally reviewed CTA without any overt filling defects - formal read pending *Update -formal read confirms no filling defects/embolus  T2D uncontrolled w/ hyperglycemia A1C 11.0 07/2023 - continue sliding scale/hypoglycemic protocol  HLD HTN PTSD Depression Suicidal ideation history CKD3b **labs at baseline - continue home medications - no  changes to home therapy at this time unless otherwise specified  Illicit substance use/abuse - admits to cocaine use earlier this week  ?DVT/PT history(distant - not currently being treated) ?History of malingering per chart review - follow clinically  DVT prophylaxis: Continue lovenox  Code Status: Full  Family Communication: None present  Status is: Obs  Dispo: The patient is from: Home              Anticipated d/c is to: Home              Anticipated d/c date is: 24h              Patient currently not medically stable for discharge given respiratory distress - likely safe to DC in 24-48h pending respiratory status after IV antibiotics  Consultants:  None  Procedures:  None   Past Medical History:  Diagnosis Date   Chronic kidney disease    Diabetes mellitus without complication (HCC)    Dyspnea    H/O suicide attempt    cut wrists - 19-y-o   High cholesterol    Hypertension    Neuropathy    PTSD (post-traumatic stress disorder)     Past Surgical History:  Procedure Laterality Date   LEFT HEART CATH AND CORONARY ANGIOGRAPHY N/A 02/02/2023   Procedure: LEFT HEART CATH AND CORONARY ANGIOGRAPHY;  Surgeon: Tonny Bollman, MD;  Location: [facility];  Service: Cardiovascular;  Laterality: N/A;     reports that she has been smoking cigarettes. She started smoking about 9 months ago. She has a 0.8 pack-year smoking history. She does not have any smokeless tobacco history on file. She reports that she does not currently use alcohol. She reports current drug use. Drugs: Marijuana and Cocaine.  Allergies  Allergen Reactions   Aspirin Rash and Other (See Comments)

## 2023-08-15 NOTE — ED Triage Notes (Signed)
Pt. Stated, I was here on Monday and dx with pneumonia and today I have chest pain, SOB and feel bad all over. I was given 2  antibiotic, it seems to be getting worse.

## 2023-08-15 NOTE — ED Triage Notes (Signed)
EMS stated, picked her up from a motel with  chest pain , and SOB. Lungs are clear. Kicked out of her previous living . CBG  462,  Pt. Stated, I lost all my medication when I was kicked out.

## 2023-08-15 NOTE — ED Provider Notes (Signed)
[place] EMERGENCY DEPARTMENT AT [facility] Provider Note   CSN: [id number] Arrival date & time: 08/15/23  1103     History  Chief Complaint  Patient presents with   Chest Pain   Shortness of Breath    Hailey Klein is a 57 y.o. female with medical history of CKD, diabetes, high cholesterol, hypertension, neuropathy, PTSD, cocaine use disorder, acute venous embolism and DVT, malingering, NSTEMI.  Patient presents to ED for evaluation of shortness of breath and chest pain, right leg swelling.  Patient reports that she was seen here 2 days ago and diagnosed with pneumonia.  She reports that ever since being seen 2 days ago she has had persistent chest pain and shortness of breath very similar to the chest pain that brought her in initially on Monday.  She was discharged with Augmentin and azithromycin for pneumonia and she has been taking this as directed but states that her shortness of breath and chest pain is worsened.  She reports that she had a low-grade fever yesterday but is unsure what the actual thermometer temperature read as she did not use thermometer.  She also reports worsening leg swelling of her right lower extremity, reports a history of DVT.  Her hemoglobin is also decreased from being seen 2 days ago but she denies any blood loss, blood in stool.  She denies nausea or vomiting, diarrhea.  She is also endorsing lightheadedness and dizziness.  Denies any exertional component to her shortness of breath or chest pain.   Chest Pain Associated symptoms: dizziness, fever and shortness of breath   Associated symptoms: no nausea and no vomiting   Shortness of Breath Associated symptoms: chest pain and fever   Associated symptoms: no vomiting        Home Medications Prior to Admission medications   Medication Sig Start Date End Date Taking? Authorizing Provider  albuterol (VENTOLIN HFA) 108 (90 Base) MCG/ACT inhaler Inhale 2 puffs into the lungs every 4 (four)  hours as needed for wheezing or shortness of breath. 03/28/22   [provider]  amLODipine (NORVASC) 10 MG tablet Take 1 tablet (10 mg total) by mouth daily. 08/10/23 09/09/23  Karie Fetch, MD  amoxicillin-clavulanate (AUGMENTIN) 875-125 MG tablet Take 1 tablet by mouth every 12 (twelve) hours. 08/13/23   Nira Conn, MD  ARIPiprazole (ABILIFY) 10 MG tablet Take 1 tablet (10 mg total) by mouth daily. 08/10/23 09/09/23  Karie Fetch, MD  aspirin EC 81 MG tablet Take 1 tablet (81 mg total) by mouth daily. Swallow whole. 08/10/23 09/09/23  Karie Fetch, MD  atorvastatin (LIPITOR) 80 MG tablet Take 1 tablet (80 mg total) by mouth daily. 08/10/23 09/09/23  Karie Fetch, MD  azithromycin (ZITHROMAX Z-PAK) 250 MG tablet 500 mg already given in ER on day 1. Take 250 mg once a day for days 2, 3, 4, and 5. 08/14/23   Cardama, Amadeo Garnet, MD  BYDUREON BCISE 2 MG/0.85ML AUIJ Inject 2 mg into the skin every Tuesday. 05/13/22   [provider]  cyanocobalamin (VITAMIN B12) 500 MCG tablet Take 1 tablet (500 mcg total) by mouth daily. 08/03/23   Hongalgi, Maximino Greenland, MD  diphenhydramine-acetaminophen (TYLENOL PM) 25-500 MG TABS tablet Take 1 tablet by mouth at bedtime as needed (For sleep or pain).    [provider]  feeding supplement, GLUCERNA SHAKE, (GLUCERNA SHAKE) LIQD Take 237 mLs by mouth 3 (three) times daily between meals. 08/10/23 09/09/23  Karie Fetch, MD  fluticasone (FLONASE) 50 MCG/ACT  nasal spray Place 1 spray into both nostrils daily as needed for allergies or rhinitis. 05/13/22   [provider]  folic acid (FOLVITE) 1 MG tablet Take 1 tablet (1 mg total) by mouth daily. 08/03/23   Hongalgi, Maximino Greenland, MD  hydrOXYzine (ATARAX) 25 MG tablet Take 1 tablet (25 mg total) by mouth 3 (three) times daily as needed for anxiety. 08/10/23 09/09/23  Karie Fetch, MD  LANTUS SOLOSTAR 100 UNIT/ML Solostar Pen Inject 28 Units into the skin at bedtime. 05/13/22    [provider]  losartan (COZAAR) 100 MG tablet Take 1 tablet (100 mg total) by mouth daily. 08/10/23 09/09/23  Karie Fetch, MD  mirtazapine (REMERON) 15 MG tablet Take 1 tablet (15 mg total) by mouth at bedtime. 08/10/23 09/09/23  Karie Fetch, MD  montelukast (SINGULAIR) 10 MG tablet Take 10 mg by mouth at bedtime. 05/13/22   [provider]  naphazoline-glycerin (CLEAR EYES REDNESS) 0.012-0.25 % SOLN Place 2 drops into both eyes 4 (four) times daily as needed (For allergies).    [provider]  nicotine (NICODERM CQ - DOSED IN MG/24 HOURS) 14 mg/24hr patch Place 1 patch (14 mg total) onto the skin daily. 08/10/23 09/09/23  Karie Fetch, MD  ondansetron (ZOFRAN-ODT) 4 MG disintegrating tablet Take 1 tablet (4 mg total) by mouth every 8 (eight) hours as needed for up to 3 days for nausea or vomiting. 08/13/23 08/16/23  Cardama, Amadeo Garnet, MD  potassium chloride (KLOR-CON M) 10 MEQ tablet Take 1 tablet (10 mEq total) by mouth daily. 08/10/23 09/09/23  Karie Fetch, MD  pregabalin (LYRICA) 100 MG capsule Take 1 capsule (100 mg total) by mouth 2 (two) times daily. 08/10/23 09/09/23  Karie Fetch, MD  SYMBICORT 160-4.5 MCG/ACT inhaler Inhale 2 puffs into the lungs 2 (two) times daily. 05/13/22   [provider]  thiamine (VITAMIN B1) 100 MG tablet Take 100 mg by mouth 2 (two) times daily. 05/13/22   [provider]  torsemide (DEMADEX) 20 MG tablet Take 1 tablet (20 mg total) by mouth daily. 08/10/23 09/09/23  Karie Fetch, MD  traZODone (DESYREL) 50 MG tablet Take 1 tablet (50 mg total) by mouth at bedtime as needed for sleep. 08/10/23 09/09/23  Karie Fetch, MD  venlafaxine XR (EFFEXOR-XR) 75 MG 24 hr capsule Take 1 capsule (75 mg total) by mouth daily with breakfast. 08/10/23 09/09/23  Karie Fetch, MD      Allergies    Aspirin    Review of Systems   Review of Systems  Constitutional:  Positive for fever.  Respiratory:   Positive for shortness of breath.   Cardiovascular:  Positive for chest pain and leg swelling.  Gastrointestinal:  Negative for nausea and vomiting.  Neurological:  Positive for dizziness and light-headedness.  All other systems reviewed and are negative.   Physical Exam Updated Vital Signs BP (!) 152/82   Pulse 84   Temp 98 F (36.7 C) (Temporal)   Resp 18   LMP 07/21/2015   SpO2 100%  Physical Exam Vitals and nursing note reviewed.  Constitutional:      General: She is not in acute distress.    Appearance: Normal appearance. She is not ill-appearing, toxic-appearing or diaphoretic.  HENT:     Head: Normocephalic and atraumatic.     Nose: Nose normal.     Mouth/Throat:     Mouth: Mucous membranes are moist.     Pharynx: Oropharynx is clear.  Eyes:     Extraocular Movements: Extraocular  movements intact.     Conjunctiva/sclera: Conjunctivae normal.     Pupils: Pupils are equal, round, and reactive to light.  Cardiovascular:     Rate and Rhythm: Normal rate and regular rhythm.  Pulmonary:     Effort: Pulmonary effort is normal.     Breath sounds: Normal breath sounds. No wheezing.  Abdominal:     General: Abdomen is flat. Bowel sounds are normal.     Palpations: Abdomen is soft.     Tenderness: There is no abdominal tenderness.  Musculoskeletal:     Cervical back: Normal range of motion and neck supple. No tenderness.     Right lower leg: Edema present.  Skin:    General: Skin is warm and dry.     Capillary Refill: Capillary refill takes less than 2 seconds.  Neurological:     Mental Status: She is alert and oriented to person, place, and time.     ED Results / Procedures / Treatments   Labs (all labs ordered are listed, but only abnormal results are displayed) Labs Reviewed  BASIC METABOLIC PANEL - Abnormal; Notable for the following components:      Result Value   CO2 21 (*)    Glucose, Bld 324 (*)    Creatinine, Ser 1.10 (*)    Calcium 8.4 (*)    GFR,  Estimated 59 (*)    All other components within normal limits  CBC - Abnormal; Notable for the following components:   WBC 10.6 (*)    RBC 2.67 (*)    Hemoglobin 7.5 (*)    HCT 23.9 (*)    All other components within normal limits  TROPONIN I (HIGH SENSITIVITY) - Abnormal; Notable for the following components:   Troponin I (High Sensitivity) 30 (*)    All other components within normal limits  TROPONIN I (HIGH SENSITIVITY) - Abnormal; Notable for the following components:   Troponin I (High Sensitivity) 27 (*)    All other components within normal limits  POC OCCULT BLOOD, ED  TYPE AND SCREEN    EKG None  Radiology DG Chest 2 View  Result Date: 08/15/2023 CLINICAL DATA:  Shortness of breath for 2 weeks. EXAM: CHEST - 2 VIEW COMPARISON:  August 13, 2023. FINDINGS: Mild cardiomegaly is noted. Mild patchy airspace opacities are noted in the right lung concerning for pneumonia. Left lung is clear. Bony thorax is unremarkable. IMPRESSION: Mild patchy airspace opacities are noted in right lung concerning for pneumonia. Followup PA and lateral chest X-ray is recommended in 3-4 weeks following trial of antibiotic therapy to ensure resolution and exclude underlying malignancy. Electronically Signed   By: Lupita Raider M.D.   On: 08/15/2023 12:53    Procedures Procedures   Medications Ordered in ED Medications  azithromycin (ZITHROMAX) 500 mg in sodium chloride 0.9 % 250 mL IVPB (has no administration in time range)  cefTRIAXone (ROCEPHIN) 1 g in sodium chloride 0.9 % 100 mL IVPB (1 g Intravenous New Bag/Given 08/15/23 1406)  iohexol (OMNIPAQUE) 350 MG/ML injection 75 mL (75 mLs Intravenous Contrast Given 08/15/23 1505)  iohexol (OMNIPAQUE) 350 MG/ML injection 65 mL (65 mLs Intravenous Contrast Given 08/15/23 1507)    ED Course/ Medical Decision Making/ A&P Clinical Course as of 08/15/23 1527  Wed Aug 15, 2023  1517 Dr. Natale Milch [CG]    Clinical Course User Index [CG] Al Decant, PA-C   Medical Decision Making Amount and/or Complexity of Data Reviewed Labs: ordered. Radiology: ordered.   57 year old  female presents to the ED for evaluation.  Please see HPI for further details.  On examination patient is afebrile and nontachycardic.  Her lung sounds are clear bilaterally, she is not a toxic sitting in the bed.  Abdomen is soft and compressible throughout.  Neurological examination at baseline.  Right lower extremity does appear edematous and larger compared to left.  She does endorse a history of DVT.  CBC with slight leukocytosis of 10.6, hemoglobin 7.5.  Patient hemoglobin has dropped from 8.42 days ago to 7.5 today.  Metabolic panel shows glucose 324, creatinine 1.1.  She does not have an anion gap, her creatinine is at baseline.  Patient fecal occult negative here so GI bleed not because of decreased hemoglobin.  Chest x-ray shows continued pneumonia.  Will proceed with CTA to rule out blood clot.  Will also collect ultrasound imaging DVT study of right lower extremity to rule out DVT.  Patient started on broad-spectrum antibiotics ceftriaxone, azithromycin here.  Patient will be signed out at oncoming provider Maxwell Marion, PA-C for further management and care.  Plan of management discussed with oncoming provider.  Prior to my shift ending, discussed patient with Dr. Natale Milch of [facility] hospitalist service.  He has agreed to come and see the patient and determine disposition.  Will also have nursing staff ambulate patient.   Final Clinical Impression(s) / ED Diagnoses Final diagnoses:  Community acquired pneumonia, unspecified laterality  Chest pain, unspecified type    Rx / DC Orders ED Discharge Orders     None         Clent Ridges 08/15/23 1527    Lorre Nick, MD 08/16/23 1609

## 2023-08-15 NOTE — ED Notes (Signed)
ED TO INPATIENT HANDOFF REPORT  ED Nurse Name and Phone #: [phone], tori and topher   S Name/Age/Gender Hailey Klein 57 y.o. female Room/Bed: TRACC/TRACC  Code Status   Code Status: Full Code  Home/SNF/Other Home Patient oriented to: self, place, time, and situation Is this baseline? Yes   Triage Complete: Triage complete  Chief Complaint Pneumonia [J18.9]  Triage Note EMS stated, picked her up from a motel with  chest pain , and SOB. Lungs are clear. Kicked out of her previous living . CBG  462,  Pt. Stated, I lost all my medication when I was kicked out.  Pt. Stated, I was here on Monday and dx with pneumonia and today I have chest pain, SOB and feel bad all over. I was given 2  antibiotic, it seems to be getting worse.   Allergies Allergies  Allergen Reactions   Aspirin Rash and Other (See Comments)    high doses ago    Level of Care/Admitting Diagnosis ED Disposition     ED Disposition  Admit   Condition  --   Comment  Hospital Area: [facility] [[id number]]  Level of Care: Med-Surg [16]  May place patient in observation at [facility] or [facility] if equivalent level of care is available:: No  Covid Evaluation: Confirmed COVID Negative  Diagnosis: Pneumonia [227785]  Admitting Physician: Azucena Fallen [[id number]]  Attending Physician: Azucena Fallen [[id number]]          B Medical/Surgery History Past Medical History:  Diagnosis Date   Chronic kidney disease    Diabetes mellitus without complication (HCC)    Dyspnea    H/O suicide attempt    cut wrists - 19-y-o   High cholesterol    Hypertension    Neuropathy    PTSD (post-traumatic stress disorder)    Past Surgical History:  Procedure Laterality Date   LEFT HEART CATH AND CORONARY ANGIOGRAPHY N/A 02/02/2023   Procedure: LEFT HEART CATH AND CORONARY ANGIOGRAPHY;  Surgeon: Tonny Bollman, MD;  Location: [facility] INVASIVE CV LAB;  Service: Cardiovascular;  Laterality: N/A;      A IV Location/Drains/Wounds Patient Lines/Drains/Airways Status     Active Line/Drains/Airways     Name Placement date Placement time Site Days   Peripheral IV 08/15/23 20 G Right Antecubital 08/15/23  1349  Antecubital  less than 1            Intake/Output Last 24 hours No intake or output data in the 24 hours ending 08/15/23 1604  Labs/Imaging Results for orders placed or performed during the hospital encounter of 08/15/23 (from the past 48 hour(s))  Basic metabolic panel     Status: Abnormal   Collection Time: 08/15/23 12:16 PM  Result Value Ref Range   Sodium 135 135 - 145 mmol/L   Potassium 3.5 3.5 - 5.1 mmol/L   Chloride 103 98 - 111 mmol/L   CO2 21 (L) 22 - 32 mmol/L   Glucose, Bld 324 (H) 70 - 99 mg/dL    Comment: Glucose reference range applies only to samples taken after fasting for at least 8 hours.   BUN 12 6 - 20 mg/dL   Creatinine, Ser 3.76 (H) 0.44 - 1.00 mg/dL   Calcium 8.4 (L) 8.9 - 10.3 mg/dL   GFR, Estimated 59 (L) >60 mL/min    Comment: (NOTE) Calculated using the CKD-EPI Creatinine Equation (2021)    Anion gap 11 5 - 15    Comment: Performed at [facility]  Lab, [address] [place], [place] [address]  CBC     Status: Abnormal   Collection Time: 08/15/23 12:16 PM  Result Value Ref Range   WBC 10.6 (H) 4.0 - 10.5 K/uL   RBC 2.67 (L) 3.87 - 5.11 MIL/uL   Hemoglobin 7.5 (L) 12.0 - 15.0 g/dL   HCT 60.6 (L) 30.1 - 60.1 %   MCV 89.5 80.0 - 100.0 fL   MCH 28.1 26.0 - 34.0 pg   MCHC 31.4 30.0 - 36.0 g/dL   RDW 09.3 23.5 - 57.3 %   Platelets 324 150 - 400 K/uL   nRBC 0.0 0.0 - 0.2 %    Comment: Performed at [facility], [address] [place], [place] [address]  Troponin I (High Sensitivity)     Status: Abnormal   Collection Time: 08/15/23 12:16 PM  Result Value Ref Range   Troponin I (High Sensitivity) 30 (H) <18 ng/L    Comment: (NOTE) Elevated high sensitivity troponin I (hsTnI) values and significant  changes across serial  measurements may suggest ACS but many other  chronic and acute conditions are known to elevate hsTnI results.  Refer to the "Links" section for chest pain algorithms and additional  guidance. Performed at [facility], [address], [place], [place] [address]   Troponin I (High Sensitivity)     Status: Abnormal   Collection Time: 08/15/23  1:47 PM  Result Value Ref Range   Troponin I (High Sensitivity) 27 (H) <18 ng/L    Comment: (NOTE) Elevated high sensitivity troponin I (hsTnI) values and significant  changes across serial measurements may suggest ACS but many other  chronic and acute conditions are known to elevate hsTnI results.  Refer to the "Links" section for chest pain algorithms and additional  guidance. Performed at [facility], [address] [place], [place] [address]   Type and screen [facility]     Status: None   Collection Time: 08/15/23  2:01 PM  Result Value Ref Range   ABO/RH(D) B POS    Antibody Screen NEG    Sample Expiration      08/18/2023,2359 Performed at [facility], [address], [place], [place] [address]   POC occult blood, ED     Status: None   Collection Time: 08/15/23  2:58 PM  Result Value Ref Range   Fecal Occult Bld NEGATIVE NEGATIVE   VAS [place] LOWER EXTREMITY VENOUS (DVT) (7a-7p)  Result Date: 08/15/2023  Lower Venous DVT Study Patient Name:  Hailey Klein  Date of Exam:   08/15/2023 Medical Rec #: [id number]        Accession #:    [id number] Date of Birth: 1966/06/07         Patient Gender: F Patient Age:   42 years Exam Location:  [facility] Procedure:      VAS [place] LOWER EXTREMITY VENOUS (DVT) Referring Phys: Delice Bison --------------------------------------------------------------------------------  Indications: Swelling, Pain, SOB, and Pneumonia for 2 days.  Risk Factors: Obesity. Limitations: Body habitus and poor ultrasound/tissue interface. Comparison Study: 02/01/23 Negative.  Performing Technologist: Marilynne Halsted RDMS, RVT  Examination Guidelines: A complete evaluation includes B-mode imaging, spectral Doppler, color Doppler, and power Doppler as needed of all accessible portions of each vessel. Bilateral testing is considered an integral part of a complete examination. Limited examinations for reoccurring indications may be performed as noted. The reflux portion of the exam is performed with the patient in  reverse Trendelenburg.  +---------+---------------+---------+-----------+----------+--------------+ RIGHT    CompressibilityPhasicitySpontaneityPropertiesThrombus Aging +---------+---------------+---------+-----------+----------+--------------+ CFV      Full           Yes      Yes                                 +---------+---------------+---------+-----------+----------+--------------+ SFJ      Full                                                        +---------+---------------+---------+-----------+----------+--------------+ FV Prox  Full                                                        +---------+---------------+---------+-----------+----------+--------------+ FV Mid   Full                                                        +---------+---------------+---------+-----------+----------+--------------+ FV DistalFull                                                        +---------+---------------+---------+-----------+----------+--------------+ PFV      Full                                                        +---------+---------------+---------+-----------+----------+--------------+ POP      Full           Yes      Yes                                 +---------+---------------+---------+-----------+----------+--------------+ PTV      Full                                                        +---------+---------------+---------+-----------+----------+--------------+ PERO     Full                                                         +---------+---------------+---------+-----------+----------+--------------+   +----+---------------+---------+-----------+----------+--------------+ LEFTCompressibilityPhasicitySpontaneityPropertiesThrombus Aging +----+---------------+---------+-----------+----------+--------------+ CFV Full           Yes      Yes                                 +----+---------------+---------+-----------+----------+--------------+  SFJ Full                                                        +----+---------------+---------+-----------+----------+--------------+   Summary: RIGHT: - There is no evidence of deep vein thrombosis in the lower extremity.  - No cystic structure found in the popliteal fossa.  LEFT: - No evidence of common femoral vein obstruction.   *See table(s) above for measurements and observations. Electronically signed by Sherald Hess MD on 08/15/2023 at 3:53:02 PM.    Final    CT Angio Chest PE W and/or Wo Contrast  Result Date: 08/15/2023 CLINICAL DATA:  Shortness of breath.  PE suspected EXAM: CT ANGIOGRAPHY CHEST WITH CONTRAST TECHNIQUE: Multidetector CT imaging of the chest was performed using the standard protocol during bolus administration of intravenous contrast. Multiplanar CT image reconstructions and MIPs were obtained to evaluate the vascular anatomy. RADIATION DOSE REDUCTION: This exam was performed according to the departmental dose-optimization program which includes automated exposure control, adjustment of the mA and/or kV according to patient size and/or use of iterative reconstruction technique. CONTRAST:  65mL OMNIPAQUE IOHEXOL 350 MG/ML SOLN COMPARISON:  Chest radiograph 08/15/2023 and CTA chest 02/01/2023 FINDINGS: Cardiovascular: Negative for acute pulmonary embolism. No pericardial effusion. Coronary artery calcification. No aortic aneurysm or dissection. Mediastinum/Nodes: New mediastinal and right hilar lymphadenopathy  compared with 02/01/2023. For example 1.1 cm right hilar node on 09/218 and 1.1 cm pretracheal node on 09/106. Trachea and esophagus are unremarkable. Lungs/Pleura: Patchy ground-glass and consolidative opacities greatest in the right upper lobe. No pleural effusion or pneumothorax. Upper Abdomen: No acute abnormality. Musculoskeletal: No acute fracture. Review of the MIP images confirms the above findings. IMPRESSION: 1. Negative for acute pulmonary embolism. 2. Multifocal pneumonia. Follow-up PA and lateral chest x-ray is recommended in 4-6 weeks after treatment to ensure resolution. 3. New mediastinal and right hilar lymphadenopathy, likely reactive. Electronically Signed   By: Minerva Fester M.D.   On: 08/15/2023 15:45   DG Chest 2 View  Result Date: 08/15/2023 CLINICAL DATA:  Shortness of breath for 2 weeks. EXAM: CHEST - 2 VIEW COMPARISON:  August 13, 2023. FINDINGS: Mild cardiomegaly is noted. Mild patchy airspace opacities are noted in the right lung concerning for pneumonia. Left lung is clear. Bony thorax is unremarkable. IMPRESSION: Mild patchy airspace opacities are noted in right lung concerning for pneumonia. Followup PA and lateral chest X-ray is recommended in 3-4 weeks following trial of antibiotic therapy to ensure resolution and exclude underlying malignancy. Electronically Signed   By: Lupita Raider M.D.   On: 08/15/2023 12:53    Pending Labs Unresulted Labs (From admission, onward)     Start     Ordered   08/22/23 0500  Creatinine, serum  (enoxaparin (LOVENOX)    CrCl >/= 30 ml/min)  Weekly,   R     Comments: while on enoxaparin therapy    08/15/23 1543   08/16/23 0500  Comprehensive metabolic panel  Tomorrow morning,   R        08/15/23 1543   08/16/23 0500  CBC  Tomorrow morning,   R        08/15/23 1543   08/15/23 1540  Creatinine, serum  (enoxaparin (LOVENOX)    CrCl >/= 30 ml/min)  Once,   R  Comments: Baseline for enoxaparin therapy IF NOT ALREADY DRAWN.     08/15/23 1543   08/15/23 1539  Culture, blood (routine x 2) Call MD if unable to obtain prior to antibiotics being given  BLOOD CULTURE X 2,   R (with TIMED occurrences)     Comments: If blood cultures drawn in Emergency Department - Do not draw and cancel order    08/15/23 1543   08/15/23 1539  Expectorated Sputum Assessment w Gram Stain, Rflx to Resp Cult  Once,   R        08/15/23 1543   08/15/23 1539  Legionella Pneumophila Serogp 1 Ur Ag  Once,   R        08/15/23 1543   08/15/23 1539  Strep pneumoniae urinary antigen  Once,   R        08/15/23 1543   08/15/23 1537  HIV Antibody (routine testing w rflx)  (HIV Antibody (Routine testing w reflex) panel)  Once,   R        08/15/23 1543            Vitals/Pain Today's Vitals   08/15/23 1430 08/15/23 1515 08/15/23 1530 08/15/23 1540  BP: (!) 155/78 (!) 152/82 (!) 161/88   Pulse: 81 84 81 80  Resp: 20 18 (!) 22 14  Temp:  98.8 F (37.1 C)    TempSrc:  Oral    SpO2: 100% 100% 98% 98%  PainSc:  10-Worst pain ever      Isolation Precautions No active isolations  Medications Medications  azithromycin (ZITHROMAX) 500 mg in sodium chloride 0.9 % 250 mL IVPB (500 mg Intravenous New Bag/Given 08/15/23 1530)  cefTRIAXone (ROCEPHIN) 2 g in sodium chloride 0.9 % 100 mL IVPB (has no administration in time range)  azithromycin (ZITHROMAX) 500 mg in sodium chloride 0.9 % 250 mL IVPB (has no administration in time range)  enoxaparin (LOVENOX) injection 77.5 mg (has no administration in time range)  amLODipine (NORVASC) tablet 10 mg (has no administration in time range)  ARIPiprazole (ABILIFY) tablet 10 mg (has no administration in time range)  atorvastatin (LIPITOR) tablet 80 mg (has no administration in time range)  fluticasone (FLONASE) 50 MCG/ACT nasal spray 1 spray (has no administration in time range)  hydrOXYzine (ATARAX) tablet 25 mg (has no administration in time range)  losartan (COZAAR) tablet 100 mg (has no administration in  time range)  mirtazapine (REMERON) tablet 15 mg (has no administration in time range)  montelukast (SINGULAIR) tablet 10 mg (has no administration in time range)  mometasone-formoterol (DULERA) 200-5 MCG/ACT inhaler 2 puff (has no administration in time range)  cefTRIAXone (ROCEPHIN) 1 g in sodium chloride 0.9 % 100 mL IVPB (0 g Intravenous Stopped 08/15/23 1540)  iohexol (OMNIPAQUE) 350 MG/ML injection 75 mL (75 mLs Intravenous Contrast Given 08/15/23 1505)  iohexol (OMNIPAQUE) 350 MG/ML injection 65 mL (65 mLs Intravenous Contrast Given 08/15/23 1507)    Mobility walks     Focused Assessments Cardiac Assessment Handoff:    No results found for: "CKTOTAL", "CKMB", "CKMBINDEX", "TROPONINI" Lab Results  Component Value Date   DDIMER 0.32 02/18/2023   Does the Patient currently have chest pain? No    R Recommendations: See Admitting Provider Note  Report given to:   Additional Notes: axox4, VSS

## 2023-08-15 NOTE — ED Provider Notes (Addendum)
I provided a substantive portion of the care of this patient.  I personally made/approved the management plan for this patient and take responsibility for the patient management.          ED ECG REPORT   Date: 08/15/2023  Rate: 80  Rhythm: normal sinus rhythm  QRS Axis: normal  Intervals: normal  ST/T Wave abnormalities: nonspecific ST/T changes  Conduction Disutrbances:right bundle branch block  Narrative Interpretation:   Old EKG Reviewed: unchanged  I have personally reviewed the EKG tracing and agree with the computerized printout as noted.  57 year old female presents with increased shortness of breath after being diagnosed with pneumonia 2 days ago.  Chest x-ray consistent with persistent pneumonia.  Started on IV antibiotics.  Plan will be for admission to the hospitalist team       Lorre Nick, MD 08/15/23 1408    Lorre Nick, MD 08/15/23 1418

## 2023-08-15 NOTE — ED Provider Triage Note (Signed)
Emergency Medicine Provider Triage Evaluation Note  Hailey Klein , a 56 y.o. female  was evaluated in triage.  Pt complains of continued symptoms from when she was here a few days ago including cough, chest pain, shortness of breath.  Was recently kicked out of motel and states they threw away all of her chronic medications.  She still has the antibiotics and has been taking them.  Review of Systems  Positive: Cough, chest pain, shortness of breath, low-grade fever  Physical Exam  BP (!) 157/90 (BP Location: Right Arm)   Pulse 79   Temp 99 F (37.2 C) (Oral)   Resp 19   LMP 07/21/2015   SpO2 99%  Gen:   Awake, no distress  Resp:  Normal effort, no wheezing Cardiac:  RRR   Medical Decision Making  Medically screening exam initiated at 11:35 AM.  Appropriate orders placed.  Hailey Klein was informed that the remainder of the evaluation will be completed by another provider, this initial triage assessment does not replace that evaluation, and the importance of remaining in the ED until their evaluation is complete.  Screening labs, ECG, Chest xray. Will likely need refill of home meds if able to be discharged.   Pricilla Loveless, MD 08/15/23 [phone]

## 2023-08-15 NOTE — Progress Notes (Signed)
Right lower ext venous  has been completed. Refer to [facility] under chart review to view preliminary results.   08/15/2023  3:44 PM Dorris Pierre, Gerarda Gunther

## 2023-08-16 ENCOUNTER — Encounter (HOSPITAL_COMMUNITY): Payer: Self-pay | Admitting: Internal Medicine

## 2023-08-16 DIAGNOSIS — R195 Other fecal abnormalities: Secondary | ICD-10-CM | POA: Diagnosis not present

## 2023-08-16 DIAGNOSIS — S301XXA Contusion of abdominal wall, initial encounter: Secondary | ICD-10-CM | POA: Diagnosis not present

## 2023-08-16 DIAGNOSIS — J189 Pneumonia, unspecified organism: Secondary | ICD-10-CM | POA: Diagnosis present

## 2023-08-16 DIAGNOSIS — E1140 Type 2 diabetes mellitus with diabetic neuropathy, unspecified: Secondary | ICD-10-CM | POA: Diagnosis present

## 2023-08-16 DIAGNOSIS — Z6841 Body Mass Index (BMI) 40.0 and over, adult: Secondary | ICD-10-CM | POA: Diagnosis not present

## 2023-08-16 DIAGNOSIS — Z86711 Personal history of pulmonary embolism: Secondary | ICD-10-CM | POA: Diagnosis not present

## 2023-08-16 DIAGNOSIS — F17210 Nicotine dependence, cigarettes, uncomplicated: Secondary | ICD-10-CM | POA: Diagnosis present

## 2023-08-16 DIAGNOSIS — D649 Anemia, unspecified: Secondary | ICD-10-CM

## 2023-08-16 DIAGNOSIS — E1165 Type 2 diabetes mellitus with hyperglycemia: Secondary | ICD-10-CM | POA: Diagnosis present

## 2023-08-16 DIAGNOSIS — E7800 Pure hypercholesterolemia, unspecified: Secondary | ICD-10-CM | POA: Diagnosis present

## 2023-08-16 DIAGNOSIS — N189 Chronic kidney disease, unspecified: Secondary | ICD-10-CM | POA: Diagnosis not present

## 2023-08-16 DIAGNOSIS — F1410 Cocaine abuse, uncomplicated: Secondary | ICD-10-CM | POA: Diagnosis present

## 2023-08-16 DIAGNOSIS — K269 Duodenal ulcer, unspecified as acute or chronic, without hemorrhage or perforation: Secondary | ICD-10-CM | POA: Diagnosis not present

## 2023-08-16 DIAGNOSIS — I2510 Atherosclerotic heart disease of native coronary artery without angina pectoris: Secondary | ICD-10-CM | POA: Diagnosis not present

## 2023-08-16 DIAGNOSIS — K254 Chronic or unspecified gastric ulcer with hemorrhage: Secondary | ICD-10-CM | POA: Diagnosis not present

## 2023-08-16 DIAGNOSIS — Z811 Family history of alcohol abuse and dependence: Secondary | ICD-10-CM | POA: Diagnosis not present

## 2023-08-16 DIAGNOSIS — W19XXXA Unspecified fall, initial encounter: Secondary | ICD-10-CM | POA: Diagnosis not present

## 2023-08-16 DIAGNOSIS — J9600 Acute respiratory failure, unspecified whether with hypoxia or hypercapnia: Secondary | ICD-10-CM | POA: Diagnosis present

## 2023-08-16 DIAGNOSIS — I130 Hypertensive heart and chronic kidney disease with heart failure and stage 1 through stage 4 chronic kidney disease, or unspecified chronic kidney disease: Secondary | ICD-10-CM | POA: Diagnosis present

## 2023-08-16 DIAGNOSIS — Z1152 Encounter for screening for COVID-19: Secondary | ICD-10-CM | POA: Diagnosis not present

## 2023-08-16 DIAGNOSIS — R55 Syncope and collapse: Secondary | ICD-10-CM | POA: Diagnosis not present

## 2023-08-16 DIAGNOSIS — K259 Gastric ulcer, unspecified as acute or chronic, without hemorrhage or perforation: Secondary | ICD-10-CM | POA: Diagnosis not present

## 2023-08-16 DIAGNOSIS — R109 Unspecified abdominal pain: Secondary | ICD-10-CM | POA: Diagnosis present

## 2023-08-16 DIAGNOSIS — F329 Major depressive disorder, single episode, unspecified: Secondary | ICD-10-CM | POA: Diagnosis present

## 2023-08-16 DIAGNOSIS — K250 Acute gastric ulcer with hemorrhage: Secondary | ICD-10-CM | POA: Diagnosis not present

## 2023-08-16 DIAGNOSIS — Z794 Long term (current) use of insulin: Secondary | ICD-10-CM | POA: Diagnosis not present

## 2023-08-16 DIAGNOSIS — E1122 Type 2 diabetes mellitus with diabetic chronic kidney disease: Secondary | ICD-10-CM | POA: Diagnosis present

## 2023-08-16 DIAGNOSIS — E860 Dehydration: Secondary | ICD-10-CM | POA: Diagnosis not present

## 2023-08-16 DIAGNOSIS — Z7951 Long term (current) use of inhaled steroids: Secondary | ICD-10-CM | POA: Diagnosis not present

## 2023-08-16 DIAGNOSIS — D62 Acute posthemorrhagic anemia: Secondary | ICD-10-CM | POA: Diagnosis present

## 2023-08-16 DIAGNOSIS — Z86718 Personal history of other venous thrombosis and embolism: Secondary | ICD-10-CM | POA: Diagnosis not present

## 2023-08-16 DIAGNOSIS — Z7982 Long term (current) use of aspirin: Secondary | ICD-10-CM | POA: Diagnosis not present

## 2023-08-16 DIAGNOSIS — N1832 Chronic kidney disease, stage 3b: Secondary | ICD-10-CM | POA: Diagnosis present

## 2023-08-16 DIAGNOSIS — I5032 Chronic diastolic (congestive) heart failure: Secondary | ICD-10-CM | POA: Diagnosis not present

## 2023-08-16 DIAGNOSIS — F4310 Post-traumatic stress disorder, unspecified: Secondary | ICD-10-CM | POA: Diagnosis present

## 2023-08-16 DIAGNOSIS — Z79899 Other long term (current) drug therapy: Secondary | ICD-10-CM | POA: Diagnosis not present

## 2023-08-16 DIAGNOSIS — K921 Melena: Secondary | ICD-10-CM | POA: Diagnosis not present

## 2023-08-16 DIAGNOSIS — R519 Headache, unspecified: Secondary | ICD-10-CM | POA: Diagnosis not present

## 2023-08-16 DIAGNOSIS — Z886 Allergy status to analgesic agent status: Secondary | ICD-10-CM | POA: Diagnosis not present

## 2023-08-16 HISTORY — DX: Pneumonia, unspecified organism: J18.9

## 2023-08-16 LAB — COMPREHENSIVE METABOLIC PANEL
ALT: 29 U/L (ref 0–44)
AST: 20 U/L (ref 15–41)
Albumin: 2.3 g/dL — ABNORMAL LOW (ref 3.5–5.0)
Alkaline Phosphatase: 87 U/L (ref 38–126)
Anion gap: 11 (ref 5–15)
BUN: 17 mg/dL (ref 6–20)
CO2: 20 mmol/L — ABNORMAL LOW (ref 22–32)
Calcium: 7.8 mg/dL — ABNORMAL LOW (ref 8.9–10.3)
Chloride: 103 mmol/L (ref 98–111)
Creatinine, Ser: 1.49 mg/dL — ABNORMAL HIGH (ref 0.44–1.00)
GFR, Estimated: 41 mL/min — ABNORMAL LOW (ref >60)
Glucose, Bld: 333 mg/dL — ABNORMAL HIGH (ref 70–99)
Potassium: 3.1 mmol/L — ABNORMAL LOW (ref 3.5–5.1)
Sodium: 134 mmol/L — ABNORMAL LOW (ref 135–145)
Total Bilirubin: 0.4 mg/dL (ref 0.3–1.2)
Total Protein: 6.4 g/dL — ABNORMAL LOW (ref 6.5–8.1)

## 2023-08-16 LAB — CBC
HCT: 21.1 % — ABNORMAL LOW (ref 36.0–46.0)
Hemoglobin: 6.7 g/dL — CL (ref 12.0–15.0)
MCH: 28 pg (ref 26.0–34.0)
MCHC: 31.8 g/dL (ref 30.0–36.0)
MCV: 88.3 fL (ref 80.0–100.0)
Platelets: 288 10*3/uL (ref 150–400)
RBC: 2.39 MIL/uL — ABNORMAL LOW (ref 3.87–5.11)
RDW: 12.8 % (ref 11.5–15.5)
WBC: 8.8 10*3/uL (ref 4.0–10.5)
nRBC: 0 % (ref 0.0–0.2)

## 2023-08-16 LAB — URINALYSIS, ROUTINE W REFLEX MICROSCOPIC
Bacteria, UA: NONE SEEN
Bilirubin Urine: NEGATIVE
Glucose, UA: 150 mg/dL — AB
Ketones, ur: NEGATIVE mg/dL
Leukocytes,Ua: NEGATIVE
Nitrite: NEGATIVE
Protein, ur: >=300 mg/dL — AB
Specific Gravity, Urine: 1.033 — ABNORMAL HIGH (ref 1.005–1.030)
pH: 5 (ref 5.0–8.0)

## 2023-08-16 LAB — GLUCOSE, CAPILLARY
Glucose-Capillary: 388 mg/dL — ABNORMAL HIGH (ref 70–99)
Glucose-Capillary: 304 mg/dL — ABNORMAL HIGH (ref 70–99)

## 2023-08-16 LAB — PREPARE RBC (CROSSMATCH)

## 2023-08-16 LAB — STREP PNEUMONIAE URINARY ANTIGEN: Strep Pneumo Urinary Antigen: NEGATIVE

## 2023-08-16 LAB — OCCULT BLOOD X 1 CARD TO LAB, STOOL: Fecal Occult Bld: POSITIVE — AB

## 2023-08-16 MED ORDER — POTASSIUM CHLORIDE CRYS ER 20 MEQ PO TBCR
40.0000 meq | EXTENDED_RELEASE_TABLET | Freq: Once | ORAL | Status: AC
  Administered 2023-08-16: 40 meq via ORAL
  Filled 2023-08-16: qty 2

## 2023-08-16 MED ORDER — ALBUTEROL SULFATE (2.5 MG/3ML) 0.083% IN NEBU: 2.5000 mg | INHALATION_SOLUTION | RESPIRATORY_TRACT | Status: DC | PRN

## 2023-08-16 MED ORDER — INSULIN ASPART 100 UNIT/ML IJ SOLN
0.0000 [IU] | Freq: Every day | INTRAMUSCULAR | Status: DC
  Administered 2023-08-16 – 2023-08-18 (×2): 4 [IU] via SUBCUTANEOUS

## 2023-08-16 MED ORDER — SODIUM CHLORIDE 0.9% IV SOLUTION: Freq: Once | INTRAVENOUS | Status: AC

## 2023-08-16 MED ORDER — INSULIN ASPART 100 UNIT/ML IJ SOLN
0.0000 [IU] | Freq: Three times a day (TID) | INTRAMUSCULAR | Status: DC
  Administered 2023-08-16: 9 [IU] via SUBCUTANEOUS
  Administered 2023-08-17: 7 [IU] via SUBCUTANEOUS
  Administered 2023-08-17: 3 [IU] via SUBCUTANEOUS
  Administered 2023-08-17: 9 [IU] via SUBCUTANEOUS
  Administered 2023-08-18: 7 [IU] via SUBCUTANEOUS
  Administered 2023-08-18: 3 [IU] via SUBCUTANEOUS
  Administered 2023-08-18 – 2023-08-19 (×2): 7 [IU] via SUBCUTANEOUS

## 2023-08-16 MED ORDER — ENOXAPARIN SODIUM 80 MG/0.8ML IJ SOSY
80.0000 mg | PREFILLED_SYRINGE | INTRAMUSCULAR | Status: DC
  Administered 2023-08-16: 80 mg via SUBCUTANEOUS
  Filled 2023-08-16: qty 0.8

## 2023-08-16 MED ORDER — TRAZODONE HCL 50 MG PO TABS
50.0000 mg | ORAL_TABLET | Freq: Every evening | ORAL | Status: DC | PRN
  Administered 2023-08-18: 50 mg via ORAL
  Filled 2023-08-16: qty 1

## 2023-08-16 MED ORDER — IPRATROPIUM-ALBUTEROL 0.5-2.5 (3) MG/3ML IN SOLN
3.0000 mL | Freq: Once | RESPIRATORY_TRACT | Status: AC
  Administered 2023-08-16: 3 mL via RESPIRATORY_TRACT
  Filled 2023-08-16: qty 3

## 2023-08-16 NOTE — Progress Notes (Signed)
TRH night cross cover note:   I was notified by RN of the patient's hemoglobin level of 6.7 this morning, compared to 7.5 yesterday morning.  Vital signs appear stable, with most recent blood pressure 136/61, and heart rates in the 70s.  The patient has an active type and screen.  I subsequently placed orders for transfusion of 1 unit PRBC over 3 hours, as well as order for repeat H&H to be checked following completion of transfusion of 1 unit PRBC.     Newton Pigg, DO Hospitalist

## 2023-08-16 NOTE — Plan of Care (Signed)

## 2023-08-16 NOTE — Progress Notes (Signed)
DVT) (7a-7p)  Result Date: 08/15/2023  Lower Venous DVT Study Patient Name:  Hailey Klein  Date of Exam:   08/15/2023 Medical Rec #: [id number]        Accession #:    [id number] Date of Birth: 1966/06/18         Patient Gender: F Patient Age:   57 years Exam Location:  [facility] Procedure:      VAS [place] LOWER EXTREMITY VENOUS (DVT) Referring Phys: Delice Bison --------------------------------------------------------------------------------  Indications: Swelling, Pain, SOB, and Pneumonia for 2 days.  Risk Factors: Obesity. Limitations: Body habitus and poor ultrasound/tissue interface. Comparison Study: 02/01/23 Negative. Performing Technologist: Marilynne Halsted RDMS, RVT  Examination Guidelines: A complete evaluation includes B-mode imaging, spectral Doppler, color Doppler, and power Doppler as needed of all accessible portions of each vessel. Bilateral testing is considered an integral part of a complete examination. Limited examinations for reoccurring indications may be performed as noted. The reflux portion of the exam is performed with the patient in reverse Trendelenburg.  +---------+---------------+---------+-----------+----------+--------------+ RIGHT     CompressibilityPhasicitySpontaneityPropertiesThrombus Aging +---------+---------------+---------+-----------+----------+--------------+ CFV      Full           Yes      Yes                                 +---------+---------------+---------+-----------+----------+--------------+ SFJ      Full                                                        +---------+---------------+---------+-----------+----------+--------------+ FV Prox  Full                                                        +---------+---------------+---------+-----------+----------+--------------+ FV Mid   Full                                                        +---------+---------------+---------+-----------+----------+--------------+ FV DistalFull                                                        +---------+---------------+---------+-----------+----------+--------------+ PFV      Full                                                        +---------+---------------+---------+-----------+----------+--------------+ POP      Full           Yes      Yes                                 +---------+---------------+---------+-----------+----------+--------------+  PROGRESS NOTE    Hailey Klein  WGN:[id number] DOB: May 09, 1966 DOA: 08/15/2023 PCP: Pcp, No   Brief Narrative:  Hailey Klein is a 57 y.o. 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 She has reported multiple times to our ED over the past week with worsening shortness of breath for previously treated pneumonia given worsening imaging and symptoms patient will be admitted for failure of outpatient therapy given her worsening pneumonia despite reported compliance with antibiotics. Hospitalist called to admit in the setting of failure of outpatient antibiotics with Augmentin/z-pack.    Assessment & Plan:   Principal Problem:   Pneumonia   Acute respiratory failure without hypoxia secondary to multifocal/bilateral community acquired pneumonia with failure of outpatient therapy - Patient does not meet sepsis criteria - Imaging shows evolving pneumonia - Markedly dyspneic from her baseline - walking O2 screen q-shift - Reported failure of outpatient Augmentin/Z-pack   Acute anemia, questionably symptomatic -Likely exacerbating symptoms as above - remains without hypoxia -CBC downtrending globally, possibly hemodilutional -Patient ROS nonspecific -1u PRBC today - follow repeat labs -Fecal/urine samples for bleed -*Patient reports she has never had a colonoscopy - "scared because my mom had one and they found cancer and she died when they tried to remove it" -lengthy discussion on needs for routine screening so that she can avoid cancer diagnosis and avoid any surgical procedures.  PE ruled out - Less likely - personally reviewed CTA without  any overt filling defects - formal read pending *Update -formal read confirms no filling defects/embolus   T2D uncontrolled w/ hyperglycemia A1C 11.0 07/2023 - continue sliding scale/hypoglycemic protocol   HLD HTN PTSD Major depression Suicidal ideation history CKD3b Labs near baseline- continue home medications - no changes to home therapy at this time unless otherwise specified   Illicit substance use/abuse - admits to cocaine use earlier this week ?DVT/PT history(distant - not currently being treated -current studies are negative) ?History of malingering per chart review - follow clinically -review of systems somewhat limited in the setting  DVT prophylaxis: Holding in the setting of questionable occult bleed/blood loss Code Status:   Code Status: Full Code Family Communication: None present  Status is: Inpatient  Dispo: The patient is from: Home              Anticipated d/c is to: Home              Anticipated d/c date is: 24 to 48 hours              Patient currently not medically stable for discharge  Consultants:  None  Procedures:  None  Antimicrobials:  Azithromycin, ceftriaxone  Subjective: No acute issues or events overnight, morning labs indicative of worsening anemia.  Patient denies any bleeding but then agrees when asked if she is having maroon-red or black stool, if she is having pain with passage of stool  Objective: Vitals:   08/15/23 1656 08/16/23 0407 08/16/23 0724 08/16/23 1125  BP: (!) 157/96 136/61 (!) 142/81 (!) 158/83  Pulse: 82 70 76 75  Resp:      Temp: 97.9 F (36.6 C) 98.5 F (36.9 C) 98.6 F (37 C) 98.6 F (37 C)  TempSrc: Oral Oral Oral Oral  SpO2:  DVT) (7a-7p)  Result Date: 08/15/2023  Lower Venous DVT Study Patient Name:  Hailey Klein  Date of Exam:   08/15/2023 Medical Rec #: [id number]        Accession #:    [id number] Date of Birth: 1966/06/18         Patient Gender: F Patient Age:   57 years Exam Location:  [facility] Procedure:      VAS [place] LOWER EXTREMITY VENOUS (DVT) Referring Phys: Delice Bison --------------------------------------------------------------------------------  Indications: Swelling, Pain, SOB, and Pneumonia for 2 days.  Risk Factors: Obesity. Limitations: Body habitus and poor ultrasound/tissue interface. Comparison Study: 02/01/23 Negative. Performing Technologist: Marilynne Halsted RDMS, RVT  Examination Guidelines: A complete evaluation includes B-mode imaging, spectral Doppler, color Doppler, and power Doppler as needed of all accessible portions of each vessel. Bilateral testing is considered an integral part of a complete examination. Limited examinations for reoccurring indications may be performed as noted. The reflux portion of the exam is performed with the patient in reverse Trendelenburg.  +---------+---------------+---------+-----------+----------+--------------+ RIGHT     CompressibilityPhasicitySpontaneityPropertiesThrombus Aging +---------+---------------+---------+-----------+----------+--------------+ CFV      Full           Yes      Yes                                 +---------+---------------+---------+-----------+----------+--------------+ SFJ      Full                                                        +---------+---------------+---------+-----------+----------+--------------+ FV Prox  Full                                                        +---------+---------------+---------+-----------+----------+--------------+ FV Mid   Full                                                        +---------+---------------+---------+-----------+----------+--------------+ FV DistalFull                                                        +---------+---------------+---------+-----------+----------+--------------+ PFV      Full                                                        +---------+---------------+---------+-----------+----------+--------------+ POP      Full           Yes      Yes                                 +---------+---------------+---------+-----------+----------+--------------+  PROGRESS NOTE    Hailey Klein  WGN:[id number] DOB: May 09, 1966 DOA: 08/15/2023 PCP: Pcp, No   Brief Narrative:  Hailey Klein is a 57 y.o. 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 She has reported multiple times to our ED over the past week with worsening shortness of breath for previously treated pneumonia given worsening imaging and symptoms patient will be admitted for failure of outpatient therapy given her worsening pneumonia despite reported compliance with antibiotics. Hospitalist called to admit in the setting of failure of outpatient antibiotics with Augmentin/z-pack.    Assessment & Plan:   Principal Problem:   Pneumonia   Acute respiratory failure without hypoxia secondary to multifocal/bilateral community acquired pneumonia with failure of outpatient therapy - Patient does not meet sepsis criteria - Imaging shows evolving pneumonia - Markedly dyspneic from her baseline - walking O2 screen q-shift - Reported failure of outpatient Augmentin/Z-pack   Acute anemia, questionably symptomatic -Likely exacerbating symptoms as above - remains without hypoxia -CBC downtrending globally, possibly hemodilutional -Patient ROS nonspecific -1u PRBC today - follow repeat labs -Fecal/urine samples for bleed -*Patient reports she has never had a colonoscopy - "scared because my mom had one and they found cancer and she died when they tried to remove it" -lengthy discussion on needs for routine screening so that she can avoid cancer diagnosis and avoid any surgical procedures.  PE ruled out - Less likely - personally reviewed CTA without  any overt filling defects - formal read pending *Update -formal read confirms no filling defects/embolus   T2D uncontrolled w/ hyperglycemia A1C 11.0 07/2023 - continue sliding scale/hypoglycemic protocol   HLD HTN PTSD Major depression Suicidal ideation history CKD3b Labs near baseline- continue home medications - no changes to home therapy at this time unless otherwise specified   Illicit substance use/abuse - admits to cocaine use earlier this week ?DVT/PT history(distant - not currently being treated -current studies are negative) ?History of malingering per chart review - follow clinically -review of systems somewhat limited in the setting  DVT prophylaxis: Holding in the setting of questionable occult bleed/blood loss Code Status:   Code Status: Full Code Family Communication: None present  Status is: Inpatient  Dispo: The patient is from: Home              Anticipated d/c is to: Home              Anticipated d/c date is: 24 to 48 hours              Patient currently not medically stable for discharge  Consultants:  None  Procedures:  None  Antimicrobials:  Azithromycin, ceftriaxone  Subjective: No acute issues or events overnight, morning labs indicative of worsening anemia.  Patient denies any bleeding but then agrees when asked if she is having maroon-red or black stool, if she is having pain with passage of stool  Objective: Vitals:   08/15/23 1656 08/16/23 0407 08/16/23 0724 08/16/23 1125  BP: (!) 157/96 136/61 (!) 142/81 (!) 158/83  Pulse: 82 70 76 75  Resp:      Temp: 97.9 F (36.6 C) 98.5 F (36.9 C) 98.6 F (37 C) 98.6 F (37 C)  TempSrc: Oral Oral Oral Oral  SpO2:  DVT) (7a-7p)  Result Date: 08/15/2023  Lower Venous DVT Study Patient Name:  Hailey Klein  Date of Exam:   08/15/2023 Medical Rec #: [id number]        Accession #:    [id number] Date of Birth: 1966/06/18         Patient Gender: F Patient Age:   57 years Exam Location:  [facility] Procedure:      VAS [place] LOWER EXTREMITY VENOUS (DVT) Referring Phys: Delice Bison --------------------------------------------------------------------------------  Indications: Swelling, Pain, SOB, and Pneumonia for 2 days.  Risk Factors: Obesity. Limitations: Body habitus and poor ultrasound/tissue interface. Comparison Study: 02/01/23 Negative. Performing Technologist: Marilynne Halsted RDMS, RVT  Examination Guidelines: A complete evaluation includes B-mode imaging, spectral Doppler, color Doppler, and power Doppler as needed of all accessible portions of each vessel. Bilateral testing is considered an integral part of a complete examination. Limited examinations for reoccurring indications may be performed as noted. The reflux portion of the exam is performed with the patient in reverse Trendelenburg.  +---------+---------------+---------+-----------+----------+--------------+ RIGHT     CompressibilityPhasicitySpontaneityPropertiesThrombus Aging +---------+---------------+---------+-----------+----------+--------------+ CFV      Full           Yes      Yes                                 +---------+---------------+---------+-----------+----------+--------------+ SFJ      Full                                                        +---------+---------------+---------+-----------+----------+--------------+ FV Prox  Full                                                        +---------+---------------+---------+-----------+----------+--------------+ FV Mid   Full                                                        +---------+---------------+---------+-----------+----------+--------------+ FV DistalFull                                                        +---------+---------------+---------+-----------+----------+--------------+ PFV      Full                                                        +---------+---------------+---------+-----------+----------+--------------+ POP      Full           Yes      Yes                                 +---------+---------------+---------+-----------+----------+--------------+

## 2023-08-16 NOTE — TOC Initial Note (Signed)
Transition of Care [facility]) - Initial/Assessment Note    Patient Details  Name: Hailey Klein MRN: [id number] Date of Birth: 12-26-1965  [facility] of [facility] CM/SW Contact:    Janae Bridgeman, RN Phone Number: 08/16/2023, 12:25 PM  Clinical Narrative:                 CM met with the patient at the bedside to discuss TOC needs and provide community resources - including shelter resources.  Patient admitted for pneumonia and is currently on RA.  Patient is homeless along with boyfriend.  The patient states that she has been staying in the [facility] prior to admission to the hospital but does not have money to return to the hotel.  The patient's boyfriend is currently working as a Administrator in the area for money.  The patient was provided with shelter resources at the bedside.  Patient has history of recent use of THC and cocaine.  Patient provided with OP substance abuse counseling  - in the AVS.  Patient is a current smoker as well - resources provided for smoking cessation as well  Patient states that her and her boyfriend do not drive.  Transportation resources included as well in the AVS.  Patient was provided with 2 bus tickets at the bedside.  PCP - patient plans to follow up with [facility] - reminder to call and schedule was included in the AVs since patient currently has appointments set up that will need to be re-scheduled.  Expected Discharge Plan: Home/Self Care Barriers to Discharge: Continued Medical Work up   Patient Goals and CMS Choice Patient states their goals for this hospitalization and ongoing recovery are:: to get better CMS Medicare.gov Compare Post Acute Care list provided to:: Patient Choice offered to / list presented to : Patient [place] ownership interest in [facility].provided to:: Patient    Expected Discharge Plan and Services   Discharge Planning Services: CM Consult Post Acute Care Choice: Resumption of Svcs/PTA  Provider Living arrangements for the past 2 months: Homeless                                      Prior Living Arrangements/Services Living arrangements for the past 2 months: Homeless Lives with:: Significant Other Patient language and need for interpreter reviewed:: Yes Do you feel safe going back to the place where you live?: Yes (Patient is currently homeless - resources provided to follow up with IRC/Hotel)      Need for Family Participation in Patient Care: Yes (Comment) Care giver support system in place?: Yes (comment)   Criminal Activity/Legal Involvement Pertinent to Current Situation/Hospitalization: No - Comment as needed  Activities of Daily Living   ADL Screening (condition at time of admission) Independently performs ADLs?: No Does the patient have a NEW difficulty with bathing/dressing/toileting/self-feeding that is expected to last >3 days?: No Does the patient have a NEW difficulty with getting in/out of bed, walking, or climbing stairs that is expected to last >3 days?: No Does the patient have a NEW difficulty with communication that is expected to last >3 days?: No Is the patient deaf or have difficulty hearing?: No Does the patient have difficulty seeing, even when wearing glasses/contacts?: No Does the patient have difficulty concentrating, remembering, or making decisions?: No  Permission Sought/Granted Permission sought to share information with : Case Manager, Magazine features editor Permission granted to share  information with : Yes, Verbal Permission Granted              Emotional Assessment Appearance:: Appears stated age Attitude/Demeanor/Rapport: Gracious Affect (typically observed): Accepting Orientation: : Oriented to Self, Oriented to Place, Oriented to  Time, Oriented to Situation Alcohol / Substance Use: Illicit Drugs (recent use of cocain, marijuana) Psych Involvement: No (comment)  Admission diagnosis:  Pneumonia  [J18.9] Chest pain, unspecified type [R07.9] Community acquired pneumonia, unspecified laterality [J18.9] Patient Active Problem List   Diagnosis Date Noted   Pneumonia 08/15/2023   MDD (major depressive disorder), recurrent episode, severe (HCC) 08/03/2023   Dyspepsia 08/01/2023   Migraines 08/01/2023   Normocytic anemia 08/01/2023   Moderate protein malnutrition (HCC) 08/01/2023   GAD (generalized anxiety disorder) 07/31/2023   Polysubstance abuse (HCC) 07/30/2023   Suicidal ideation 07/30/2023   Hypokalemia 02/02/2023   Hypomagnesemia 02/02/2023   Chest pain at rest 02/01/2023   Necrotizing soft tissue infection 08/30/2022   Renal vein thrombosis (HCC) 06/23/2022   Class 3 obesity 06/22/2022   Acute on chronic diastolic CHF (congestive heart failure) (HCC) 06/20/2022   AKI (acute kidney injury) (HCC) 06/20/2022   Essential hypertension 06/20/2022   Type 2 diabetes mellitus with hyperlipidemia (HCC) 06/20/2022   Acute gout 06/19/2022   Restrictive lung disease secondary to obesity 03/22/2020   Acute venous embolism and thrombosis of deep vessels of proximal lower extremity (HCC) 09/10/2019   Diabetic polyneuropathy associated with type 2 diabetes mellitus (HCC) 09/10/2018   Class 3 severe obesity due to excess calories without serious comorbidity in adult (HCC) 05/03/2018   Tobacco abuse 01/08/2018   Chronic coronary artery disease 07/13/2017   NSTEMI (non-ST elevated myocardial infarction) (HCC) 04/30/2017   Alcohol use disorder, moderate, in sustained remission (HCC) 12/02/2015   Cocaine abuse (HCC) 12/02/2015   Opioid use disorder, moderate, in sustained remission (HCC) 12/02/2015   Cannabis use disorder, mild, abuse 12/02/2015   PTSD (post-traumatic stress disorder) 12/01/2015   MDD (major depressive disorder), recurrent, severe, with psychosis (HCC) 12/01/2015   Malingering 07/05/2015   Depression 07/03/2015   PCP:  Oneita Hurt, No Pharmacy:   [facility] - [place],  - [address] [place] [address] Phone: [phone] Fax: [phone]     Social Determinants of Health (SDOH) Social History: SDOH Screenings   Food Insecurity: No Food Insecurity (08/15/2023)  Recent Concern: Food Insecurity - Food Insecurity Present (08/01/2023)  Housing: Low Risk  (08/15/2023)  Recent Concern: Housing - High Risk (08/01/2023)  Transportation Needs: No Transportation Needs (08/15/2023)  Recent Concern: Transportation Needs - Unmet Transportation Needs (08/01/2023)  Utilities: Not At Risk (08/15/2023)  Alcohol Screen: Low Risk  (08/03/2023)  Financial Resource Strain: Medium Risk (07/11/2022)  Tobacco Use: High Risk (08/13/2023)   SDOH Interventions:     Readmission Risk Interventions    06/23/2022    4:53 PM  Readmission Risk Prevention Plan  Transportation Screening Complete  PCP or Specialist Appt within 3-5 Days Complete  HRI or Home Care Consult Complete  Social Work Consult for Recovery Care Planning/Counseling Complete  Palliative Care Screening Not Applicable  Medication Review Oceanographer) Complete

## 2023-08-16 NOTE — Inpatient Diabetes Management (Signed)
Inpatient Diabetes Program Recommendations  AACE/ADA: New Consensus Statement on Inpatient Glycemic Control (2015)  Target Ranges:  Prepandial:   less than 140 mg/dL      Peak postprandial:   less than 180 mg/dL (1-2 hours)      Critically ill patients:  140 - 180 mg/dL   Lab Results  Component Value Date   GLUCAP 123 (H) 08/10/2023   HGBA1C 11.0 (H) 07/30/2023    Review of Glycemic Control  Latest Reference Range & Units 08/16/23 05:03  Glucose 70 - 99 mg/dL 161 (H)   Diabetes history: DM 2 Outpatient Diabetes medications:  Lantus 28 units q HS ( not taking states she lost insulin in move) Bydureon 2 mg daily Current orders for Inpatient glycemic control:  None  Inpatient Diabetes Program Recommendations:   Note history of Diabetes- Please add Novolog moderate correction tid with meals and Semglee 20 units daily- Note states that patient does not have any insulins at home since being kicked out of previous housing so will need new Rx's at discharge for DM medications.    Will follow.   Thanks,  Beryl Meager, RN, BC-ADM Inpatient Diabetes Coordinator Pager [phone] (8a-5p)

## 2023-08-16 NOTE — Progress Notes (Signed)
TR[facility] night cross cover note:   I was notified by RN that this patient, who was admitted for multifocal pneumonia after presenting with shortness of breath, is requesting a breathing treatment.  I subsequently placed order for DuoNeb treatment x 1 now as well as order for prn albuterol nebulizers.  This is in addition to existing order for scheduled Dulera twice daily.    Newton Pigg, DO Hospitalist

## 2023-08-17 ENCOUNTER — Encounter (HOSPITAL_COMMUNITY): Payer: Self-pay | Admitting: Internal Medicine

## 2023-08-17 DIAGNOSIS — J189 Pneumonia, unspecified organism: Secondary | ICD-10-CM | POA: Diagnosis not present

## 2023-08-17 DIAGNOSIS — D62 Acute posthemorrhagic anemia: Secondary | ICD-10-CM

## 2023-08-17 DIAGNOSIS — K921 Melena: Secondary | ICD-10-CM

## 2023-08-17 DIAGNOSIS — D649 Anemia, unspecified: Secondary | ICD-10-CM | POA: Diagnosis not present

## 2023-08-17 DIAGNOSIS — R195 Other fecal abnormalities: Secondary | ICD-10-CM

## 2023-08-17 LAB — BASIC METABOLIC PANEL
Anion gap: 13 (ref 5–15)
BUN: 22 mg/dL — ABNORMAL HIGH (ref 6–20)
CO2: 20 mmol/L — ABNORMAL LOW (ref 22–32)
Calcium: 8.1 mg/dL — ABNORMAL LOW (ref 8.9–10.3)
Chloride: 102 mmol/L (ref 98–111)
Creatinine, Ser: 1.46 mg/dL — ABNORMAL HIGH (ref 0.44–1.00)
GFR, Estimated: 42 mL/min — ABNORMAL LOW (ref >60)
Glucose, Bld: 347 mg/dL — ABNORMAL HIGH (ref 70–99)
Potassium: 3.6 mmol/L (ref 3.5–5.1)
Sodium: 135 mmol/L (ref 135–145)

## 2023-08-17 LAB — BPAM RBC
Blood Product Expiration Date: 202410302359
ISSUE DATE / TIME: 202410030655
Unit Type and Rh: 7300

## 2023-08-17 LAB — GLUCOSE, CAPILLARY
Glucose-Capillary: 322 mg/dL — ABNORMAL HIGH (ref 70–99)
Glucose-Capillary: 243 mg/dL — ABNORMAL HIGH (ref 70–99)
Glucose-Capillary: 353 mg/dL — ABNORMAL HIGH (ref 70–99)
Glucose-Capillary: 227 mg/dL — ABNORMAL HIGH (ref 70–99)

## 2023-08-17 LAB — TYPE AND SCREEN
ABO/RH(D): B POS
Antibody Screen: NEGATIVE
Unit division: 0

## 2023-08-17 LAB — CBC
HCT: 24.7 % — ABNORMAL LOW (ref 36.0–46.0)
Hemoglobin: 8 g/dL — ABNORMAL LOW (ref 12.0–15.0)
MCH: 28.4 pg (ref 26.0–34.0)
MCHC: 32.4 g/dL (ref 30.0–36.0)
MCV: 87.6 fL (ref 80.0–100.0)
Platelets: 318 10*3/uL (ref 150–400)
RBC: 2.82 MIL/uL — ABNORMAL LOW (ref 3.87–5.11)
RDW: 12.9 % (ref 11.5–15.5)
WBC: 9.8 10*3/uL (ref 4.0–10.5)
nRBC: 0 % (ref 0.0–0.2)

## 2023-08-17 MED ORDER — FUROSEMIDE 10 MG/ML IJ SOLN
40.0000 mg | Freq: Three times a day (TID) | INTRAMUSCULAR | Status: DC
  Administered 2023-08-17 – 2023-08-19 (×6): 40 mg via INTRAVENOUS
  Filled 2023-08-17 (×6): qty 4

## 2023-08-17 MED ORDER — SODIUM CHLORIDE 0.9 % IV SOLN: INTRAVENOUS | Status: DC

## 2023-08-17 MED ORDER — TRAMADOL HCL 50 MG PO TABS
50.0000 mg | ORAL_TABLET | Freq: Once | ORAL | Status: AC | PRN
  Administered 2023-08-17: 50 mg via ORAL
  Filled 2023-08-17: qty 1

## 2023-08-17 MED ORDER — AZITHROMYCIN 500 MG PO TABS
500.0000 mg | ORAL_TABLET | Freq: Every day | ORAL | Status: DC
  Administered 2023-08-17 – 2023-08-19 (×3): 500 mg via ORAL
  Filled 2023-08-17 (×3): qty 1

## 2023-08-17 MED ORDER — ACETAMINOPHEN 500 MG PO TABS
1000.0000 mg | ORAL_TABLET | Freq: Four times a day (QID) | ORAL | Status: DC | PRN
  Administered 2023-08-19: 1000 mg via ORAL
  Filled 2023-08-17: qty 2

## 2023-08-17 MED ORDER — NICOTINE 21 MG/24HR TD PT24
21.0000 mg | MEDICATED_PATCH | Freq: Every day | TRANSDERMAL | Status: DC
  Administered 2023-08-17 – 2023-08-19 (×3): 21 mg via TRANSDERMAL
  Filled 2023-08-17 (×3): qty 1

## 2023-08-17 MED ORDER — FUROSEMIDE 10 MG/ML IJ SOLN
60.0000 mg | Freq: Once | INTRAMUSCULAR | Status: AC
  Administered 2023-08-17: 60 mg via INTRAVENOUS
  Filled 2023-08-17: qty 8

## 2023-08-17 MED ORDER — ACETAMINOPHEN 325 MG PO TABS
650.0000 mg | ORAL_TABLET | Freq: Four times a day (QID) | ORAL | Status: DC | PRN
  Administered 2023-08-17: 650 mg via ORAL
  Filled 2023-08-17: qty 2

## 2023-08-17 NOTE — Progress Notes (Signed)
Hospital Lab, [address] [place], [place] [address]    Report Status PENDING  Incomplete         Radiology Studies: VAS [place] LOWER EXTREMITY VENOUS (DVT) (7a-7p)  Result Date: 08/15/2023  Lower Venous DVT Study Patient Name:  Hailey Klein  Date of Exam:   08/15/2023 Medical Rec #: [id number]        Accession #:    [id number] Date of Birth: 1966/02/12         Patient Gender: F Patient Age:   57 years Exam Location:  [facility] Procedure:      VAS [place] LOWER EXTREMITY VENOUS (DVT) Referring Phys: Delice Bison --------------------------------------------------------------------------------  Indications: Swelling, Pain, SOB, and Pneumonia for 2 days.  Risk Factors: Obesity. Limitations: Body habitus and poor ultrasound/tissue interface. Comparison Study: 02/01/23 Negative. Performing Technologist: Marilynne Halsted RDMS, RVT  Examination Guidelines: A complete evaluation includes B-mode imaging, spectral Doppler, color Doppler, and power Doppler as needed of all accessible portions of each vessel. Bilateral testing is considered an integral part of a complete examination. Limited examinations for  reoccurring indications may be performed as noted. The reflux portion of the exam is performed with the patient in reverse Trendelenburg.  +---------+---------------+---------+-----------+----------+--------------+ RIGHT    CompressibilityPhasicitySpontaneityPropertiesThrombus Aging +---------+---------------+---------+-----------+----------+--------------+ CFV      Full           Yes      Yes                                 +---------+---------------+---------+-----------+----------+--------------+ SFJ      Full                                                        +---------+---------------+---------+-----------+----------+--------------+ FV Prox  Full                                                        +---------+---------------+---------+-----------+----------+--------------+ FV Mid   Full                                                        +---------+---------------+---------+-----------+----------+--------------+ FV DistalFull                                                        +---------+---------------+---------+-----------+----------+--------------+ PFV      Full                                                        +---------+---------------+---------+-----------+----------+--------------+ POP      Full           Yes  Hospital Lab, [address] [place], [place] [address]    Report Status PENDING  Incomplete         Radiology Studies: VAS [place] LOWER EXTREMITY VENOUS (DVT) (7a-7p)  Result Date: 08/15/2023  Lower Venous DVT Study Patient Name:  Hailey Klein  Date of Exam:   08/15/2023 Medical Rec #: [id number]        Accession #:    [id number] Date of Birth: 1966/02/12         Patient Gender: F Patient Age:   57 years Exam Location:  [facility] Procedure:      VAS [place] LOWER EXTREMITY VENOUS (DVT) Referring Phys: Delice Bison --------------------------------------------------------------------------------  Indications: Swelling, Pain, SOB, and Pneumonia for 2 days.  Risk Factors: Obesity. Limitations: Body habitus and poor ultrasound/tissue interface. Comparison Study: 02/01/23 Negative. Performing Technologist: Marilynne Halsted RDMS, RVT  Examination Guidelines: A complete evaluation includes B-mode imaging, spectral Doppler, color Doppler, and power Doppler as needed of all accessible portions of each vessel. Bilateral testing is considered an integral part of a complete examination. Limited examinations for  reoccurring indications may be performed as noted. The reflux portion of the exam is performed with the patient in reverse Trendelenburg.  +---------+---------------+---------+-----------+----------+--------------+ RIGHT    CompressibilityPhasicitySpontaneityPropertiesThrombus Aging +---------+---------------+---------+-----------+----------+--------------+ CFV      Full           Yes      Yes                                 +---------+---------------+---------+-----------+----------+--------------+ SFJ      Full                                                        +---------+---------------+---------+-----------+----------+--------------+ FV Prox  Full                                                        +---------+---------------+---------+-----------+----------+--------------+ FV Mid   Full                                                        +---------+---------------+---------+-----------+----------+--------------+ FV DistalFull                                                        +---------+---------------+---------+-----------+----------+--------------+ PFV      Full                                                        +---------+---------------+---------+-----------+----------+--------------+ POP      Full           Yes  PROGRESS NOTE    Kendi Defalco  ZOX:[id number] DOB: [birth year] DOA: 08/15/2023 PCP: Pcp, No   Brief Narrative:  Hailey Klein is a 57 y.o. 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 She has reported multiple times to our ED over the past week with worsening shortness of breath for previously treated pneumonia given worsening imaging and symptoms patient will be admitted for failure of outpatient therapy given her worsening pneumonia despite reported compliance with antibiotics. Hospitalist called to admit in the setting of failure of outpatient antibiotics with Augmentin/z-pack.    Assessment & Plan:   Principal Problem:   Pneumonia Active Problems:   Community acquired pneumonia   Acute respiratory failure without hypoxia secondary to multifocal/bilateral community acquired pneumonia with failure of outpatient therapy - Patient does not meet sepsis criteria - Imaging shows evolving pneumonia - Markedly dyspneic from her baseline - walking O2 screen q-shift - Reported failure of outpatient Augmentin/Z-pack   Acute anemia, questionably symptomatic Rule out GI bleed -FOBT at intake negative, repeat positive with tarry black stools noted overnight -Hemoglobin downtrending to 6.7 requiring 1 unit PRBC now improved to 8 -GI following, appreciate insight and recommendations -Symptoms as above likely exacerbated by symptomatic anemia  Heart failure, grade 1 diastolic, questionable exacerbation versus volume overload  -Continue furosemide IV, transition back to p.o. torsemide -Echo March 2024 shows grade 1 diastolic dysfunction EF 65 to  70% -Given IV fluids, blood transfusion patient's volume status is elevated -TED hose, SCDs, early ambulation and elevation of lower extremities until diuretics improve urine output   PE ruled out - Less likely - personally reviewed CTA without any overt filling defects - formal read pending *Update -formal read confirms no filling defects/embolus   T2D uncontrolled w/ hyperglycemia A1C 11.0 07/2023 - continue sliding scale/hypoglycemic protocol   HLD HTN PTSD Major depression Suicidal ideation history CKD3b Near baseline- continue home medications - no changes to home therapy at this time unless otherwise specified   Illicit substance use/abuse - admits to cocaine use earlier this week ?DVT/PT history(distant - not currently being treated -current studies are negative) ?History of malingering per chart review - follow clinically -review of systems somewhat limited in the setting  DVT prophylaxis: Holding in the setting of questionable occult bleed/blood loss Code Status:   Code Status: Full Code Family Communication: None present  Status is: Inpatient  Dispo: The patient is from: Home              Anticipated d/c is to: Home              Anticipated d/c date is: 24 to 48 hours              Patient currently not medically stable for discharge  Consultants:  None  Procedures:  None  Antimicrobials:  Azithromycin, ceftriaxone  Subjective: No acute issues or events overnight, morning labs indicative of worsening anemia.  Patient denies any bleeding but then agrees when asked if she is having maroon-red or black stool, if she is having pain with passage of stool  Objective: Vitals:   08/16/23 2025 08/16/23 2300 08/17/23 0551 08/17/23 0724  BP:  (!) 134/59 124/65 (!) 146/74  Pulse: 75  99 77  Resp:  Hospital Lab, [address] [place], [place] [address]    Report Status PENDING  Incomplete         Radiology Studies: VAS [place] LOWER EXTREMITY VENOUS (DVT) (7a-7p)  Result Date: 08/15/2023  Lower Venous DVT Study Patient Name:  Hailey Klein  Date of Exam:   08/15/2023 Medical Rec #: [id number]        Accession #:    [id number] Date of Birth: 1966/02/12         Patient Gender: F Patient Age:   57 years Exam Location:  [facility] Procedure:      VAS [place] LOWER EXTREMITY VENOUS (DVT) Referring Phys: Delice Bison --------------------------------------------------------------------------------  Indications: Swelling, Pain, SOB, and Pneumonia for 2 days.  Risk Factors: Obesity. Limitations: Body habitus and poor ultrasound/tissue interface. Comparison Study: 02/01/23 Negative. Performing Technologist: Marilynne Halsted RDMS, RVT  Examination Guidelines: A complete evaluation includes B-mode imaging, spectral Doppler, color Doppler, and power Doppler as needed of all accessible portions of each vessel. Bilateral testing is considered an integral part of a complete examination. Limited examinations for  reoccurring indications may be performed as noted. The reflux portion of the exam is performed with the patient in reverse Trendelenburg.  +---------+---------------+---------+-----------+----------+--------------+ RIGHT    CompressibilityPhasicitySpontaneityPropertiesThrombus Aging +---------+---------------+---------+-----------+----------+--------------+ CFV      Full           Yes      Yes                                 +---------+---------------+---------+-----------+----------+--------------+ SFJ      Full                                                        +---------+---------------+---------+-----------+----------+--------------+ FV Prox  Full                                                        +---------+---------------+---------+-----------+----------+--------------+ FV Mid   Full                                                        +---------+---------------+---------+-----------+----------+--------------+ FV DistalFull                                                        +---------+---------------+---------+-----------+----------+--------------+ PFV      Full                                                        +---------+---------------+---------+-----------+----------+--------------+ POP      Full           Yes  Hospital Lab, [address] [place], [place] [address]    Report Status PENDING  Incomplete         Radiology Studies: VAS [place] LOWER EXTREMITY VENOUS (DVT) (7a-7p)  Result Date: 08/15/2023  Lower Venous DVT Study Patient Name:  Hailey Klein  Date of Exam:   08/15/2023 Medical Rec #: [id number]        Accession #:    [id number] Date of Birth: 1966/02/12         Patient Gender: F Patient Age:   57 years Exam Location:  [facility] Procedure:      VAS [place] LOWER EXTREMITY VENOUS (DVT) Referring Phys: Delice Bison --------------------------------------------------------------------------------  Indications: Swelling, Pain, SOB, and Pneumonia for 2 days.  Risk Factors: Obesity. Limitations: Body habitus and poor ultrasound/tissue interface. Comparison Study: 02/01/23 Negative. Performing Technologist: Marilynne Halsted RDMS, RVT  Examination Guidelines: A complete evaluation includes B-mode imaging, spectral Doppler, color Doppler, and power Doppler as needed of all accessible portions of each vessel. Bilateral testing is considered an integral part of a complete examination. Limited examinations for  reoccurring indications may be performed as noted. The reflux portion of the exam is performed with the patient in reverse Trendelenburg.  +---------+---------------+---------+-----------+----------+--------------+ RIGHT    CompressibilityPhasicitySpontaneityPropertiesThrombus Aging +---------+---------------+---------+-----------+----------+--------------+ CFV      Full           Yes      Yes                                 +---------+---------------+---------+-----------+----------+--------------+ SFJ      Full                                                        +---------+---------------+---------+-----------+----------+--------------+ FV Prox  Full                                                        +---------+---------------+---------+-----------+----------+--------------+ FV Mid   Full                                                        +---------+---------------+---------+-----------+----------+--------------+ FV DistalFull                                                        +---------+---------------+---------+-----------+----------+--------------+ PFV      Full                                                        +---------+---------------+---------+-----------+----------+--------------+ POP      Full           Yes

## 2023-08-17 NOTE — Consult Note (Signed)
Attending physician's note   I have taken a history, reviewed the chart, and examined the patient. I performed a substantive portion of this encounter, including complete performance of at least one of the key components, in conjunction with the APP. I agree with the APP's note, impression, and recommendations with my edits.   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    GI service consulted for melenic stool and acute on chronic anemia with Hgb 6.7 (baseline ~10) and FOBT positive stool.  BUN elevated last week, but in the setting of AKI on CKD.  No prior GI bleed.  No previous EGD or colonoscopy.    Good response to 1 unit PRBC transfusion with repeat Hgb 8 today.  - Clears today and n.p.o. at midnight - Started high-dose Protonix - Hold Lovenox - Plan for EGD tomorrow for diagnostic and therapeutic intent - If EGD unrevealing, may consider inpatient colonoscopy.  Otherwise plan to defer colonoscopy to outpatient from a screening standpoint. - Continue serial CBC checks with additional blood products as needed per protocol  The indications, risks, and benefits of EGD were explained to the patient in detail. Risks include but are not limited to bleeding, perforation, adverse reaction to medications, and cardiopulmonary compromise. Sequelae include but are not limited to the possibility of surgery, hospitalization, and mortality. The patient verbalized understanding and wished to proceed. All questions answered.   [address], DO, FACG [phone] office                                                     Consultation Note   Referring Provider:  [facility] PCP: Pcp, No Primary Gastroenterologist: Gentry Fitz         Reason for Consultation: black stool and anema  DOA: 08/15/2023         Hospital Day: 3   ASSESSMENT    Brief Narrative:  57 y.o. year old female with a history of hypertension,  diabetes, polysubstance abuse including cocaine use, depression, suicide attempt, malingering, PTSD, CKD . Admitted with shob and chest pain with recent cocaine use. Being treated for CAP.   Acute respiratory failure 2/2 multifocal/bilateral community acquired pneumonia ( present on admission)  On Zithromax and Rocephin  Melena / acute on chronic anemia / upper abdominal pain  . Rule out PUD    Baseline hgb ~ 10, down to 6.7 during this admission. Iron studies do not suggest iron deficiency. B12 normal but she is folate deficient. Hgb 6.7 >> 8 post 1 u PRBC yesterday.    Folate deficiency, folate 5.6  Reported history of DVT.  Currently getting Lovenox 80 mg daily.     PLAN:   --She will need an EGD to evaluate melena / upper abdominal pain and progressive anemia. At some point she also needs a colonoscopy ( outpatient vr inpatient). I went ahead and discussed the risks and benefits of both the EGD and colonoscopy with possible polypectomy / biopsies and the patient agrees to proceed.  --Will start IV pantoprazole 40 mg BID --Spoke with [facility], will hold Lovenox 80 mg for now in setting of  GI bleed and anticipation of endoscopic evaluation.    HPI    Latacha is hospitalzed with multifocal CAP. Apparently failed Z-pack and Augmentin as outpatient . She gave a history of DVTs and has been on Lovenox 80 mg inpatient. She passed a black stool today and apparently yesterday as well. She gives a one week history intermittent upper abdominal pain unrelated to eating. Some nausea but no vomiting. She takes a daily baby asa, no other NSAIDS. No chronic GI problems at home. Never had a screening colonoscopy ( too scared).   Not in any respiratory distress.   Hgb 6.7, baseline ~10. MCV 87 Platelets 318 Ferritin  171 TIBC 248, 24% sat Iron 59 Folate 5.6 B12 224  Previous GI Evaluations   none  Labs and Imaging: Recent Labs    08/15/23 1216 08/16/23 0503 08/17/23 0620  WBC 10.6* 8.8 9.8  HGB 7.5* 6.7* 8.0*  HCT 23.9* 21.1* 24.7*  PLT 324 288 318   Recent Labs    08/15/23 1216 08/15/23 1835 08/16/23 0503 08/17/23 0620  NA 135  --  134* 135  K 3.5  --  3.1* 3.6  CL 103  --  103 102  CO2 21*  --  20* 20*  GLUCOSE 324*  --  333* 347*  BUN 12  --  17 22*  CREATININE 1.10* 1.09* 1.49* 1.46*  CALCIUM 8.4*  --  7.8* 8.1*   Recent Labs    08/16/23 0503  PROT 6.4*  ALBUMIN 2.3*  AST 20  ALT 29  ALKPHOS 87  BILITOT 0.4   No results for input(s): "HEPBSAG", "HCVAB", "HEPAIGM", "HEPBIGM" in the last 72 hours. No results for input(s): "LABPROT", "INR" in the last 72 hours.    Past Medical History:  Diagnosis Date   Chronic kidney disease    Diabetes mellitus without complication (HCC)    Dyspnea    H/O suicide attempt    cut wrists - 19-y-o   High cholesterol    Hypertension    Neuropathy    PTSD (post-traumatic stress disorder)     Past Surgical History:  Procedure Laterality Date   LEFT HEART CATH AND CORONARY ANGIOGRAPHY N/A 02/02/2023   Procedure: LEFT HEART CATH AND CORONARY ANGIOGRAPHY;  Surgeon: Tonny Bollman, MD;  Location: [facility] INVASIVE CV LAB;  Service: Cardiovascular;  Laterality: N/A;    Family History  Problem Relation Age of Onset   Alcoholism Other     Prior to Admission medications   Medication Sig Start Date End Date Taking? Authorizing Provider  albuterol (VENTOLIN HFA) 108 (90 Base) MCG/ACT inhaler Inhale 2 puffs into the lungs every 4 (four) hours as needed for wheezing or shortness of breath. 03/28/22  Yes [provider]  amLODipine (NORVASC) 10 MG tablet Take 1 tablet (10 mg total) by mouth daily. 08/10/23 09/09/23 Yes Karie Fetch, MD  amoxicillin-clavulanate (AUGMENTIN) 875-125 MG tablet Take 1 tablet by mouth every 12 (twelve)  hours. 08/13/23  Yes Cardama, Amadeo Garnet, MD  ARIPiprazole (ABILIFY) 10 MG tablet Take 1 tablet (10 mg total) by mouth daily. 08/10/23 09/09/23 Yes Karie Fetch, MD  aspirin EC 81 MG tablet Take 1 tablet (81 mg total) by mouth daily. Swallow whole. 08/10/23 09/09/23 Yes Karie Fetch, MD  atorvastatin (LIPITOR) 80 MG tablet Take 1 tablet (80 mg total) by mouth daily. 08/10/23 09/09/23 Yes Karie Fetch, MD  BYDUREON BCISE 2 MG/0.85ML AUIJ Inject 2 mg into the skin every Tuesday. 05/13/22  Yes [provider]  cyanocobalamin (VITAMIN  B12) 500 MCG tablet Take 1 tablet (500 mcg total) by mouth daily. 08/03/23  Yes Hongalgi, Maximino Greenland, MD  diphenhydramine-acetaminophen (TYLENOL PM) 25-500 MG TABS tablet Take 1 tablet by mouth at bedtime as needed (For sleep or pain).   Yes [provider]  folic acid (FOLVITE) 1 MG tablet Take 1 tablet (1 mg total) by mouth daily. 08/03/23  Yes Hongalgi, Maximino Greenland, MD  LANTUS SOLOSTAR 100 UNIT/ML Solostar Pen Inject 28 Units into the skin at bedtime. 05/13/22  Yes [provider]  losartan (COZAAR) 100 MG tablet Take 1 tablet (100 mg total) by mouth daily. 08/10/23 09/09/23 Yes Karie Fetch, MD  montelukast (SINGULAIR) 10 MG tablet Take 10 mg by mouth at bedtime. 05/13/22  Yes [provider]  naphazoline-glycerin (CLEAR EYES REDNESS) 0.012-0.25 % SOLN Place 2 drops into both eyes 4 (four) times daily as needed (For allergies).   Yes [provider]  nicotine (NICODERM CQ - DOSED IN MG/24 HOURS) 14 mg/24hr patch Place 1 patch (14 mg total) onto the skin daily. 08/10/23 09/09/23 Yes Karie Fetch, MD  potassium chloride (KLOR-CON M) 10 MEQ tablet Take 1 tablet (10 mEq total) by mouth daily. 08/10/23 09/09/23 Yes Karie Fetch, MD  pregabalin (LYRICA) 100 MG capsule Take 1 capsule (100 mg total) by mouth 2 (two) times daily. 08/10/23 09/09/23 Yes Karie Fetch, MD  SYMBICORT 160-4.5 MCG/ACT inhaler Inhale 2 puffs into the  lungs 2 (two) times daily. 05/13/22  Yes [provider]  thiamine (VITAMIN B1) 100 MG tablet Take 100 mg by mouth 2 (two) times daily. 05/13/22  Yes [provider]  torsemide (DEMADEX) 20 MG tablet Take 1 tablet (20 mg total) by mouth daily. 08/10/23 09/09/23 Yes Karie Fetch, MD  feeding supplement, GLUCERNA SHAKE, (GLUCERNA SHAKE) LIQD Take 237 mLs by mouth 3 (three) times daily between meals. Patient not taking: Reported on 08/15/2023 08/10/23 09/09/23  Karie Fetch, MD  fluticasone [facility]) 50 MCG/ACT nasal spray Place 1 spray into both nostrils daily as needed for allergies or rhinitis. Patient not taking: Reported on 08/15/2023 05/13/22   [provider]    Current Facility-Administered Medications  Medication Dose Route Frequency Provider Last Rate Last Admin   acetaminophen (TYLENOL) tablet 650 mg  650 mg Oral Q6H PRN Azucena Fallen, MD       albuterol (PROVENTIL) (2.5 MG/3ML) 0.083% nebulizer solution 2.5 mg  2.5 mg Nebulization Q4H PRN Howerter, Justin B, DO       amLODipine (NORVASC) tablet 10 mg  10 mg Oral Daily Azucena Fallen, MD   10 mg at 08/17/23 0851   ARIPiprazole (ABILIFY) tablet 10 mg  10 mg Oral Daily Azucena Fallen, MD   10 mg at 08/17/23 0852   atorvastatin (LIPITOR) tablet 80 mg  80 mg Oral Daily Azucena Fallen, MD   80 mg at 08/17/23 0853   azithromycin (ZITHROMAX) 500 mg in sodium chloride 0.9 % 250 mL IVPB  500 mg Intravenous Q24H Azucena Fallen, MD 250 mL/hr at 08/17/23 0903 500 mg at 08/17/23 0903   cefTRIAXone (ROCEPHIN) 2 g in sodium chloride 0.9 % 100 mL IVPB  2 g Intravenous Q24H Azucena Fallen, MD 200 mL/hr at 08/16/23 2031 2 g at 08/16/23 2031   enoxaparin (LOVENOX) injection 80 mg  80 mg Subcutaneous Q24H Azucena Fallen, MD   80 mg at 08/16/23 1749   fluticasone (FLONASE) 50 MCG/ACT nasal spray 1 spray  1 spray Each Nare Daily PRN Natale Milch,  Kristine Garbe, MD       furosemide (LASIX) injection  40 mg  40 mg Intravenous Q8H Azucena Fallen, MD       hydrOXYzine (ATARAX) tablet 25 mg  25 mg Oral TID PRN Azucena Fallen, MD   25 mg at 08/17/23 0011   insulin aspart (novoLOG) injection 0-5 Units  0-5 Units Subcutaneous QHS Azucena Fallen, MD   4 Units at 08/16/23 2209   insulin aspart (novoLOG) injection 0-9 Units  0-9 Units Subcutaneous TID WC Azucena Fallen, MD   7 Units at 08/17/23 0906   losartan (COZAAR) tablet 100 mg  100 mg Oral Daily Azucena Fallen, MD   100 mg at 08/17/23 1914   mometasone-formoterol (DULERA) 200-5 MCG/ACT inhaler 2 puff  2 puff Inhalation BID Azucena Fallen, MD   2 puff at 08/17/23 0854   montelukast (SINGULAIR) tablet 10 mg  10 mg Oral QHS Azucena Fallen, MD   10 mg at 08/16/23 2210   traZODone (DESYREL) tablet 50 mg  50 mg Oral QHS PRN Azucena Fallen, MD        Allergies as of 08/15/2023 - Review Complete 08/15/2023  Allergen Reaction Noted   Aspirin Rash and Other (See Comments) 07/22/2015    Social History   Socioeconomic History   Marital status: Single    Spouse name: Not on file   Number of children: 0   Years of education: Not on file   Highest education level: High school graduate  Occupational History   Occupation: Disabilty  Tobacco Use   Smoking status: Every Day    Current packs/day: 1.00    Average packs/day: 1 pack/day for 0.8 years (0.8 ttl pk-yrs)    Types: Cigarettes    Start date: 2024   Smokeless tobacco: Not on file  Vaping Use   Vaping status: Never Used  Substance and Sexual Activity   Alcohol use: Not Currently   Drug use: Yes    Types: Marijuana, Cocaine    Comment: using cocaine ''   Sexual activity: Yes  Other Topics Concern   Not on file  Social History Narrative   Not on file   Social Determinants of Health   Financial Resource Strain: Medium Risk (07/11/2022)   Overall Financial Resource Strain (CARDIA)    Difficulty of Paying Living Expenses: Somewhat hard   Food Insecurity: No Food Insecurity (08/15/2023)   Hunger Vital Sign    Worried About Running Out of Food in the Last Year: Never true    Ran Out of Food in the Last Year: Never true  Recent Concern: Food Insecurity - Food Insecurity Present (08/01/2023)   Hunger Vital Sign    Worried About Running Out of Food in the Last Year: Sometimes true    Ran Out of Food in the Last Year: Sometimes true  Transportation Needs: No Transportation Needs (08/15/2023)   PRAPARE - Administrator, Civil Service (Medical): No    Lack of Transportation (Non-Medical): No  Recent Concern: Transportation Needs - Unmet Transportation Needs (08/01/2023)   PRAPARE - Administrator, Civil Service (Medical): Yes    Lack of Transportation (Non-Medical): Yes  Physical Activity: Not on file  Stress: Not on file  Social Connections: Not on file  Intimate Partner Violence: Not At Risk (08/15/2023)   Humiliation, Afraid, Rape, and Kick questionnaire    Fear of Current or Ex-Partner: No    Emotionally Abused: No  Physically Abused: No    Sexually Abused: No     Code Status   Code Status: Full Code  Review of Systems: All systems reviewed and negative except where noted in HPI.  Physical Exam: Vital signs in last 24 hours: Temp:  [97.8 F (36.6 C)-98.4 F (36.9 C)] 97.8 F (36.6 C) (10/04 0724) Pulse Rate:  [75-99] 77 (10/04 0724) Resp:  [16-18] 16 (10/04 0724) BP: (124-156)/(59-81) 146/74 (10/04 0724) SpO2:  [98 %-100 %] 99 % (10/04 0724) Last BM Date : 08/17/23 (patient stated BM black, informed MD)  General:  Pleasant fwemale in NAD Psych:  Cooperative. Normal mood and affect Eyes: Pupils equal Ears:  Normal auditory acuity Nose: No deformity, discharge or lesions Neck:  Supple, no masses felt Lungs:  Clear to auscultation.  Heart:  Regular rate, regular rhythm.  Abdomen:  Soft, nondistended, nontender, active bowel sounds, no masses felt Rectal :  Deferred Msk:  Symmetrical without gross deformities.  Neurologic:  Alert, oriented, grossly normal neurologically Extremities : No edema Skin:  Intact without significant lesions.    Intake/Output from previous day: 10/03 0701 - 10/04 0700 In: 318 [Blood:318] Out: -  Intake/Output this shift:  No intake/output data recorded.  Principal Problem:   Pneumonia Active Problems:   Community acquired pneumonia    Willette Cluster, NP-C   08/17/2023, 11:55 AM   \

## 2023-08-17 NOTE — Plan of Care (Signed)
  Problem: Education: Goal: Understanding of CV disease, CV risk reduction, and recovery process will improve Outcome: Progressing   Problem: Activity: Goal: Ability to return to baseline activity level will improve Outcome: Progressing   Problem: Cardiovascular: Goal: Ability to achieve and maintain adequate cardiovascular perfusion will improve Outcome: Progressing   Problem: Coping: Goal: Ability to adjust to condition or change in health will improve Outcome: Progressing   Problem: Fluid Volume: Goal: Ability to maintain a balanced intake and output will improve Outcome: Progressing   Problem: Nutritional: Goal: Maintenance of adequate nutrition will improve Outcome: Progressing   Problem: Skin Integrity: Goal: Risk for impaired skin integrity will decrease Outcome: Progressing   Problem: Tissue Perfusion: Goal: Adequacy of tissue perfusion will improve Outcome: Progressing   Problem: Education: Goal: Knowledge of General Education information will improve Description: Including pain rating scale, medication(s)/side effects and non-pharmacologic comfort measures Outcome: Progressing   Problem: Health Behavior/Discharge Planning: Goal: Ability to manage health-related needs will improve Outcome: Progressing

## 2023-08-17 NOTE — Progress Notes (Signed)
[facility] night cross cover note:   I was contacted by RN with patient's request for a nicotine patch.  I subsequent placed order for scheduled nicotine patch, first dose now.  Additionally, RN conveyed the patient is experiencing some abdominal discomfort that has been refractory to prn acetaminophen. Will be  conservative additional pain medication.  I placed order for tramadol 50 mg p.o. x 1 dose prn, and conveyed that I would be unable to order additional narcotic pain medications for him.  I did however increase his dose of acetaminophen to 1 g p.o. every 6 hours as needed.   RN also conveys that the patient has had 3-5 dark-colored bowel movements today, with most recent hemoglobin occurring with morning labs, at which time it is noted to be 8.0.  Will recheck hemoglobin at this time.  Vital signs appear stable, with systolic blood pressures in the 150s mmHg, and heart rates in the 70s to 80s.    Newton Pigg, DO Hospitalist

## 2023-08-17 NOTE — Inpatient Diabetes Management (Addendum)
Inpatient Diabetes Program Recommendations  AACE/ADA: New Consensus Statement on Inpatient Glycemic Control (2015)  Target Ranges:  Prepandial:   less than 140 mg/dL      Peak postprandial:   less than 180 mg/dL (1-2 hours)      Critically ill patients:  140 - 180 mg/dL   Lab Results  Component Value Date   GLUCAP 243 (H) 08/17/2023   HGBA1C 11.0 (H) 07/30/2023    Review of Glycemic Control  Latest Reference Range & Units 08/16/23 16:25 08/16/23 21:54 08/17/23 06:47 08/17/23 11:36  Glucose-Capillary 70 - 99 mg/dL 161 (H) 096 (H) 045 (H) 243 (H)  Diabetes history: DM 2 Outpatient Diabetes medications:  Lantus 28 units q HS ( not taking states she lost insulin in move) Bydureon 2 mg daily Current orders for Inpatient glycemic control:  Novolog 0-9 units tid with meals and HS  Inpatient Diabetes Program Recommendations:    Please consider adding Semglee 20 units daily and Novolog 3 units tid with meals (hold if patient eats less than 50% or NPO).   Addendum:  Spoke to patient at bedside.  She confirms that she lost both her meter and insulins. She will need refill on Lantus Solostar insulin pen, insulin pen needles, and glucose meter and strips.  We discussed A1C of 11% and importance of taking insulins as ordered.  Also discussed hypoglycemia signs, symptoms and treatment.  Patient verbalized understanding.     Thanks,  Beryl Meager, RN, BC-ADM Inpatient Diabetes Coordinator Pager [phone]

## 2023-08-17 NOTE — Progress Notes (Signed)
PHARMACIST - PHYSICIAN COMMUNICATION DR:   Natale Milch CONCERNING: Antibiotic IV to Oral Route Change Policy  RECOMMENDATION: This patient is receiving azithromycin by the intravenous route.  Based on criteria approved by the Pharmacy and Therapeutics Committee, the antibiotic(s) is/are being converted to the equivalent oral dose form(s).   DESCRIPTION: These criteria include: Patient being treated for a respiratory tract infection, urinary tract infection, cellulitis or clostridium difficile associated diarrhea if on metronidazole The patient is not neutropenic and does not exhibit a GI malabsorption state The patient is eating (either orally or via tube) and/or has been taking other orally administered medications for a least 24 hours The patient is improving clinically and has a Tmax < 100.5  If you have questions about this conversion, please contact the Pharmacy Department  []   [phone] )  [facility] [x]   [phone] )  [facility]  []   [phone] )  [facility] []   [phone] )  [facility]   Rexford Maus, PharmD, BCPS 08/17/2023 1:15 PM

## 2023-08-18 DIAGNOSIS — K921 Melena: Secondary | ICD-10-CM | POA: Diagnosis not present

## 2023-08-18 DIAGNOSIS — D62 Acute posthemorrhagic anemia: Secondary | ICD-10-CM | POA: Diagnosis not present

## 2023-08-18 DIAGNOSIS — J189 Pneumonia, unspecified organism: Secondary | ICD-10-CM | POA: Diagnosis not present

## 2023-08-18 DIAGNOSIS — D649 Anemia, unspecified: Secondary | ICD-10-CM | POA: Diagnosis not present

## 2023-08-18 LAB — BASIC METABOLIC PANEL
Anion gap: 13 (ref 5–15)
BUN: 26 mg/dL — ABNORMAL HIGH (ref 6–20)
CO2: 22 mmol/L (ref 22–32)
Calcium: 8.4 mg/dL — ABNORMAL LOW (ref 8.9–10.3)
Chloride: 100 mmol/L (ref 98–111)
Creatinine, Ser: 1.4 mg/dL — ABNORMAL HIGH (ref 0.44–1.00)
GFR, Estimated: 44 mL/min — ABNORMAL LOW (ref >60)
Glucose, Bld: 385 mg/dL — ABNORMAL HIGH (ref 70–99)
Potassium: 3.6 mmol/L (ref 3.5–5.1)
Sodium: 135 mmol/L (ref 135–145)

## 2023-08-18 LAB — GLUCOSE, CAPILLARY
Glucose-Capillary: 243 mg/dL — ABNORMAL HIGH (ref 70–99)
Glucose-Capillary: 301 mg/dL — ABNORMAL HIGH (ref 70–99)
Glucose-Capillary: 306 mg/dL — ABNORMAL HIGH (ref 70–99)
Glucose-Capillary: 322 mg/dL — ABNORMAL HIGH (ref 70–99)
Glucose-Capillary: 347 mg/dL — ABNORMAL HIGH (ref 70–99)
Glucose-Capillary: 411 mg/dL — ABNORMAL HIGH (ref 70–99)
Glucose-Capillary: 418 mg/dL — ABNORMAL HIGH (ref 70–99)

## 2023-08-18 LAB — CBC
HCT: 26.2 % — ABNORMAL LOW (ref 36.0–46.0)
Hemoglobin: 8.5 g/dL — ABNORMAL LOW (ref 12.0–15.0)
MCH: 29 pg (ref 26.0–34.0)
MCHC: 32.4 g/dL (ref 30.0–36.0)
MCV: 89.4 fL (ref 80.0–100.0)
Platelets: 353 10*3/uL (ref 150–400)
RBC: 2.93 MIL/uL — ABNORMAL LOW (ref 3.87–5.11)
RDW: 13 % (ref 11.5–15.5)
WBC: 9.9 10*3/uL (ref 4.0–10.5)
nRBC: 0 % (ref 0.0–0.2)

## 2023-08-18 MED ORDER — INSULIN ASPART 100 UNIT/ML IJ SOLN
7.0000 [IU] | Freq: Once | INTRAMUSCULAR | Status: AC
  Administered 2023-08-18: 7 [IU] via SUBCUTANEOUS

## 2023-08-18 MED ORDER — INSULIN GLARGINE-YFGN 100 UNIT/ML ~~LOC~~ SOLN
10.0000 [IU] | Freq: Every day | SUBCUTANEOUS | Status: DC
  Administered 2023-08-18: 10 [IU] via SUBCUTANEOUS
  Filled 2023-08-18 (×2): qty 0.1

## 2023-08-18 MED ORDER — TRAZODONE HCL 50 MG PO TABS: 100.0000 mg | ORAL_TABLET | Freq: Every evening | ORAL | Status: DC | PRN

## 2023-08-18 MED ORDER — TRAZODONE HCL 50 MG PO TABS
50.0000 mg | ORAL_TABLET | Freq: Once | ORAL | Status: AC
  Administered 2023-08-18: 50 mg via ORAL
  Filled 2023-08-18: qty 1

## 2023-08-18 MED ORDER — PANTOPRAZOLE SODIUM 40 MG IV SOLR
40.0000 mg | Freq: Two times a day (BID) | INTRAVENOUS | Status: DC
  Administered 2023-08-18 – 2023-08-19 (×3): 40 mg via INTRAVENOUS
  Filled 2023-08-18 (×3): qty 10

## 2023-08-18 NOTE — H&P (View-Only) (Signed)
[facility] ROUNDING NOTE   Subjective: Patient reports ongoing upper abdominal discomfort and bloating.  She states that she has passed multiple dark stools.  Hemoglobin has remained stable.  Hemodynamically stable.  Tolerating regular diet without nausea or vomiting.  BUN slightly uptrending   Objective: Vital signs in last 24 hours: Temp:  [97.8 F (36.6 C)-98.1 F (36.7 C)] 97.8 F (36.6 C) (10/05 0810) Pulse Rate:  [70-90] 90 (10/05 0810) Resp:  [17-18] 17 (10/05 0810) BP: (142-157)/(77-86) 142/77 (10/05 0810) SpO2:  [99 %-100 %] 99 % (10/05 0810) Last BM Date : 08/17/23 (stool black, MD aware) General: NAD, pleasant African-American female sitting in bedside chair Lungs:  CTA b/l, no w/r/r Heart:  RRR, no m/r/g Abdomen:  Soft, NT, ND, +BS Ext:  No c/c/e    Intake/Output from previous day: 10/04 0701 - 10/05 0700 In: 900 [P.O.:900] Out: 3650 [Urine:3650] Intake/Output this shift: No intake/output data recorded.   Lab Results: Recent Labs    08/16/23 0503 08/17/23 0620 08/18/23 0120  WBC 8.8 9.8 9.9  HGB 6.7* 8.0* 8.5*  PLT 288 318 353  MCV 88.3 87.6 89.4   BMET Recent Labs    08/16/23 0503 08/17/23 0620 08/18/23 0120  NA 134* 135 135  K 3.1* 3.6 3.6  CL 103 102 100  CO2 20* 20* 22  GLUCOSE 333* 347* 385*  BUN 17 22* 26*  CREATININE 1.49* 1.46* 1.40*  CALCIUM 7.8* 8.1* 8.4*   LFT Recent Labs    08/16/23 0503  PROT 6.4*  ALBUMIN 2.3*  AST 20  ALT 29  ALKPHOS 87  BILITOT 0.4   PT/INR No results for input(s): "INR" in the last 72 hours.    Imaging/Other results: No results found.    Assessment and Plan:  57 year old female with history of diabetes, CKD 3, PTSD, depression and substance abuse, admitted for failed outpatient therapy of pneumonia, also noted to have acute on chronic anemia in the setting of melenic stools, FOBT positive.  She has epigastric discomfort and abdominal bloating.  She had taken NSAIDs in the  past, but denies any recent NSAID use. EGD had been planned for today, but due to multiple other more emergent procedures, her procedure was deferred until tomorrow.  Melena, Acute on chronic anemia - EGD rescheduled for tomorrow - Continue PPI twice daily - Diet okay for today, n.p.o. after midnight - Continue to hold Lovenox - If EGD negative for any possible bleeding source, may consider inpatient colonoscopy versus outpatient based on patient's hemoglobin and clinical symptoms.    Jenel Lucks, MD  08/18/2023, 4:39 PM  Gastroenterology

## 2023-08-18 NOTE — Progress Notes (Signed)
[facility] ROUNDING NOTE   Subjective: Patient reports ongoing upper abdominal discomfort and bloating.  She states that she has passed multiple dark stools.  Hemoglobin has remained stable.  Hemodynamically stable.  Tolerating regular diet without nausea or vomiting.  BUN slightly uptrending   Objective: Vital signs in last 24 hours: Temp:  [97.8 F (36.6 C)-98.1 F (36.7 C)] 97.8 F (36.6 C) (10/05 0810) Pulse Rate:  [70-90] 90 (10/05 0810) Resp:  [17-18] 17 (10/05 0810) BP: (142-157)/(77-86) 142/77 (10/05 0810) SpO2:  [99 %-100 %] 99 % (10/05 0810) Last BM Date : 08/17/23 (stool black, MD aware) General: NAD, pleasant African-American female sitting in bedside chair Lungs:  CTA b/l, no w/r/r Heart:  RRR, no m/r/g Abdomen:  Soft, NT, ND, +BS Ext:  No c/c/e    Intake/Output from previous day: 10/04 0701 - 10/05 0700 In: 900 [P.O.:900] Out: 3650 [Urine:3650] Intake/Output this shift: No intake/output data recorded.   Lab Results: Recent Labs    08/16/23 0503 08/17/23 0620 08/18/23 0120  WBC 8.8 9.8 9.9  HGB 6.7* 8.0* 8.5*  PLT 288 318 353  MCV 88.3 87.6 89.4   BMET Recent Labs    08/16/23 0503 08/17/23 0620 08/18/23 0120  NA 134* 135 135  K 3.1* 3.6 3.6  CL 103 102 100  CO2 20* 20* 22  GLUCOSE 333* 347* 385*  BUN 17 22* 26*  CREATININE 1.49* 1.46* 1.40*  CALCIUM 7.8* 8.1* 8.4*   LFT Recent Labs    08/16/23 0503  PROT 6.4*  ALBUMIN 2.3*  AST 20  ALT 29  ALKPHOS 87  BILITOT 0.4   PT/INR No results for input(s): "INR" in the last 72 hours.    Imaging/Other results: No results found.    Assessment and Plan:  57 year old female with history of diabetes, CKD 3, PTSD, depression and substance abuse, admitted for failed outpatient therapy of pneumonia, also noted to have acute on chronic anemia in the setting of melenic stools, FOBT positive.  She has epigastric discomfort and abdominal bloating.  She had taken NSAIDs in the  past, but denies any recent NSAID use. EGD had been planned for today, but due to multiple other more emergent procedures, her procedure was deferred until tomorrow.  Melena, Acute on chronic anemia - EGD rescheduled for tomorrow - Continue PPI twice daily - Diet okay for today, n.p.o. after midnight - Continue to hold Lovenox - If EGD negative for any possible bleeding source, may consider inpatient colonoscopy versus outpatient based on patient's hemoglobin and clinical symptoms.    Jenel Lucks, MD  08/18/2023, 4:39 PM  Gastroenterology

## 2023-08-18 NOTE — Progress Notes (Signed)
PROGRESS NOTE    Hailey Klein  BMW:[id number] DOB: [birth year] DOA: 08/15/2023 PCP: Pcp, No   Brief Narrative:  Hailey Klein is a 57 y.o. 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 She has reported multiple times to our ED over the past week with worsening shortness of breath for previously treated pneumonia given worsening imaging and symptoms patient will be admitted for failure of outpatient therapy given her worsening pneumonia despite reported compliance with antibiotics. Hospitalist called to admit in the setting of failure of outpatient antibiotics with Augmentin/z-pack.    Assessment & Plan:   Principal Problem:   Pneumonia Active Problems:   Community acquired pneumonia   Melena   ABLA (acute blood loss anemia)   Heme positive stool  Acute respiratory failure without hypoxia secondary to multifocal/bilateral community acquired pneumonia with failure of outpatient therapy - Patient does not meet sepsis criteria - Imaging shows evolving pneumonia - Markedly dyspneic from her baseline - walking O2 screen q-shift - Reported failure of outpatient Augmentin/Z-pack   Acute anemia, questionably symptomatic Rule out GI bleed -Black tarry stools ongoing -Epigastric discomfort concerning for peptic ulcer disease - PPI ongoing -Hemoglobin 8.5 after 1 unit PRBC transfused 10/3 -GI following, appreciate insight and recommendations -Symptoms as above likely exacerbated by symptomatic anemia  Heart failure, grade 1 diastolic, questionable exacerbation versus volume overload  -Continue furosemide IV, transition back to p.o. torsemide in  the next 24 to 48 hours pending volume status -Echo March 2024 shows grade 1 diastolic dysfunction EF 65 to 27% -Given IV fluids, blood transfusion patient's volume status is elevated -TED hose, SCDs, early ambulation and elevation of lower extremities until diuretics improve urine output  PE ruled out - Less likely - personally reviewed CTA without any overt filling defects - formal read pending *Update -formal read confirms no filling defects/embolus   T2D uncontrolled w/ hyperglycemia A1C 11.0 07/2023 - continue sliding scale/hypoglycemic protocol Added 10u glargine (28 at home)   HLD HTN PTSD Major depression Suicidal ideation history CKD3b Near baseline- continue home medications - no changes to home therapy at this time unless otherwise specified   Illicit substance use/abuse - admits to cocaine use earlier this week ?DVT/PT history(distant - not currently being treated -current studies are negative) ?History of malingering per chart review - follow clinically -review of systems somewhat limited in the setting  DVT prophylaxis: Place and maintain sequential compression device Start: 08/17/23 0934Holding in the setting of questionable occult bleed/blood loss Code Status:   Code Status: Full Code Family Communication: None present  Status is: Inpatient  Dispo: The patient is from: Home              Anticipated d/c is to: Home              Anticipated d/c date is: 24 to 48 hours              Patient currently not medically stable for discharge  Consultants:  None  Procedures:  None  Antimicrobials:  Azithromycin, ceftriaxone  Subjective: Overnight patient had notable hyperglycemia and epigastric pain, improved now with increased insulin regimen and PPI, supportive care ongoing.  Otherwise denies nausea vomiting diarrhea constipation headache fevers chills or chest pain  Objective: Vitals:   08/17/23 1549 08/17/23 2042 08/18/23 0607 08/18/23 0810  BP: (!) 152/86  (!) 157/86 (!) 143/84 (!) 142/77  Pulse: 81 73 70 90  Resp: 16 18 18 17   Temp: 98.3 F (36.8 C) 98.1 F (36.7 C) 98 F (36.7 C) 97.8 F (36.6 C)  TempSrc: Oral Oral Oral Oral  SpO2: 99% 100% 99% 99%    Intake/Output Summary (Last 24 hours) at 08/18/2023 1914 Last data filed at 08/18/2023 0600 Gross per 24 hour  Intake 600 ml  Output 3650 ml  Net -3050 ml   There were no vitals filed for this visit.  Examination:  General:  Pleasantly resting in bed, No acute distress. HEENT:  Normocephalic atraumatic.  Sclerae nonicteric, noninjected.  Extraocular movements intact bilaterally. Neck:  Without mass or deformity.  Trachea is midline. Lungs: Scant right sided rhonchi. Heart:  Regular rate and rhythm.  Without murmurs, rubs, or gallops. Abdomen:  Soft, nontender, nondistended.  Without guarding or rebound. Extremities: Without cyanosis, clubbing, edema, or obvious deformity. Skin:  Warm and dry, no erythema.  Data Reviewed: I have personally reviewed following labs and imaging studies  CBC: Recent Labs  Lab 08/13/23 0326 08/15/23 1216 08/16/23 0503 08/17/23 0620 08/18/23 0120  WBC 11.0* 10.6* 8.8 9.8 9.9  NEUTROABS 9.0*  --   --   --   --   HGB 8.4* 7.5* 6.7* 8.0* 8.5*  HCT 27.3* 23.9* 21.1* 24.7* 26.2*  MCV 91.3 89.5 88.3 87.6 89.4  PLT 321 324 288 318 353   Basic Metabolic Panel: Recent Labs  Lab 08/13/23 0326 08/15/23 1216 08/15/23 1835 08/16/23 0503 08/17/23 0620 08/18/23 0120  NA 138 135  --  134* 135 135  K 3.9 3.5  --  3.1* 3.6 3.6  CL 104 103  --  103 102 100  CO2 22 21*  --  20* 20* 22  GLUCOSE 299* 324*  --  333* 347* 385*  BUN 20 12  --  17 22* 26*  CREATININE 1.26* 1.10* 1.09* 1.49* 1.46* 1.40*  CALCIUM 8.5* 8.4*  --  7.8* 8.1* 8.4*  MG 1.8  --   --   --   --   --    GFR: Estimated Creatinine Clearance: 66.7 mL/min (A) (by C-G formula based on SCr of 1.4 mg/dL (H)).  Liver Function Tests: Recent Labs  Lab 08/13/23 0326 08/16/23 0503   AST 15 20  ALT 22 29  ALKPHOS 104 87  BILITOT 0.6 0.4  PROT 7.2 6.4*  ALBUMIN 2.7* 2.3*   CBG: Recent Labs  Lab 08/17/23 1552 08/17/23 2041 08/18/23 0017 08/18/23 0217 08/18/23 0739  GLUCAP 353* 227* 418*  411* 322* 243*    Recent Results (from the past 240 hour(s))  Culture, blood (routine x 2) Call MD if unable to obtain prior to antibiotics being given     Status: None (Preliminary result)   Collection Time: 08/15/23  6:35 PM   Specimen: BLOOD LEFT ARM  Result Value Ref Range Status   Specimen Description BLOOD LEFT ARM  Final   Special Requests   Final    BOTTLES DRAWN AEROBIC AND ANAEROBIC Blood Culture adequate volume   Culture   Final    NO GROWTH 3 DAYS Performed at [facility], [address], [place], [place] [address]    Report Status PENDING  Incomplete  Culture, blood (routine x 2) Call MD if unable to obtain prior to antibiotics being given  Status: None (Preliminary result)   Collection Time: 08/15/23 10:16 PM   Specimen: BLOOD LEFT ARM  Result Value Ref Range Status   Specimen Description BLOOD LEFT ARM  Final   Special Requests   Final    BOTTLES DRAWN AEROBIC AND ANAEROBIC Blood Culture adequate volume   Culture   Final    NO GROWTH 3 DAYS Performed at [facility], [address] [place], [place] [address]    Report Status PENDING  Incomplete         Radiology Studies: No results found.  Scheduled Meds:  amLODipine  10 mg Oral Daily   ARIPiprazole  10 mg Oral Daily   atorvastatin  80 mg Oral Daily   azithromycin  500 mg Oral Daily   furosemide  40 mg Intravenous Q8H   insulin aspart  0-5 Units Subcutaneous QHS   insulin aspart  0-9 Units Subcutaneous TID WC   losartan  100 mg Oral Daily   mometasone-formoterol  2 puff Inhalation BID   montelukast  10 mg Oral QHS   nicotine  21 mg Transdermal Daily   Continuous Infusions:  sodium chloride     cefTRIAXone (ROCEPHIN)  IV 2 g (08/17/23 2250)     LOS: 2 days    Time spent:  Azucena Fallen, DO [facility]  If 7PM-7AM, please contact night-coverage [email or web address]  08/18/2023, 8:21 AM

## 2023-08-18 NOTE — Progress Notes (Signed)
[facility] night cross cover note:   I was notified by RN of the patient's nightly CBG result of 411, with repeat noted to be 418.  I subsequently ordered NovoLog 7 units subcu x 1 dose now as well as a repeat CBG to be checked in approximately 1 hour, and requested to be notified if ensuing CBG result remains greater than 400.    Hailey Pigg, DO Hospitalist

## 2023-08-19 ENCOUNTER — Emergency Department (HOSPITAL_COMMUNITY)
Admission: EM | Admit: 2023-08-19 | Discharge: 2023-08-20 | Disposition: A | Discharge status: Home / Self Care | Payer: Medicaid Other | Attending: Emergency Medicine | Admitting: Emergency Medicine

## 2023-08-19 ENCOUNTER — Inpatient Hospital Stay (HOSPITAL_COMMUNITY): Payer: Medicaid Other | Admitting: Certified Registered Nurse Anesthetist

## 2023-08-19 ENCOUNTER — Encounter (HOSPITAL_COMMUNITY)
Admission: EM | Disposition: A | Discharge status: Home / Self Care | Payer: Self-pay | Source: Home / Self Care | Attending: Internal Medicine

## 2023-08-19 ENCOUNTER — Encounter (HOSPITAL_COMMUNITY): Payer: Self-pay | Admitting: *Deleted

## 2023-08-19 ENCOUNTER — Encounter (HOSPITAL_COMMUNITY): Payer: Self-pay | Admitting: Internal Medicine

## 2023-08-19 ENCOUNTER — Other Ambulatory Visit: Payer: Self-pay

## 2023-08-19 DIAGNOSIS — Y92231 Patient bathroom in hospital as the place of occurrence of the external cause: Secondary | ICD-10-CM | POA: Insufficient documentation

## 2023-08-19 DIAGNOSIS — K269 Duodenal ulcer, unspecified as acute or chronic, without hemorrhage or perforation: Secondary | ICD-10-CM | POA: Diagnosis not present

## 2023-08-19 DIAGNOSIS — W19XXXA Unspecified fall, initial encounter: Secondary | ICD-10-CM | POA: Insufficient documentation

## 2023-08-19 DIAGNOSIS — D62 Acute posthemorrhagic anemia: Secondary | ICD-10-CM | POA: Diagnosis not present

## 2023-08-19 DIAGNOSIS — I130 Hypertensive heart and chronic kidney disease with heart failure and stage 1 through stage 4 chronic kidney disease, or unspecified chronic kidney disease: Secondary | ICD-10-CM | POA: Insufficient documentation

## 2023-08-19 DIAGNOSIS — R519 Headache, unspecified: Secondary | ICD-10-CM | POA: Insufficient documentation

## 2023-08-19 DIAGNOSIS — Z79899 Other long term (current) drug therapy: Secondary | ICD-10-CM | POA: Insufficient documentation

## 2023-08-19 DIAGNOSIS — I509 Heart failure, unspecified: Secondary | ICD-10-CM | POA: Insufficient documentation

## 2023-08-19 DIAGNOSIS — R195 Other fecal abnormalities: Secondary | ICD-10-CM

## 2023-08-19 DIAGNOSIS — R55 Syncope and collapse: Secondary | ICD-10-CM | POA: Insufficient documentation

## 2023-08-19 DIAGNOSIS — K259 Gastric ulcer, unspecified as acute or chronic, without hemorrhage or perforation: Secondary | ICD-10-CM

## 2023-08-19 DIAGNOSIS — I2510 Atherosclerotic heart disease of native coronary artery without angina pectoris: Secondary | ICD-10-CM | POA: Insufficient documentation

## 2023-08-19 DIAGNOSIS — E860 Dehydration: Secondary | ICD-10-CM | POA: Insufficient documentation

## 2023-08-19 DIAGNOSIS — Z7982 Long term (current) use of aspirin: Secondary | ICD-10-CM | POA: Insufficient documentation

## 2023-08-19 DIAGNOSIS — F17210 Nicotine dependence, cigarettes, uncomplicated: Secondary | ICD-10-CM | POA: Insufficient documentation

## 2023-08-19 DIAGNOSIS — N189 Chronic kidney disease, unspecified: Secondary | ICD-10-CM | POA: Insufficient documentation

## 2023-08-19 DIAGNOSIS — E1122 Type 2 diabetes mellitus with diabetic chronic kidney disease: Secondary | ICD-10-CM | POA: Insufficient documentation

## 2023-08-19 DIAGNOSIS — F1410 Cocaine abuse, uncomplicated: Secondary | ICD-10-CM | POA: Insufficient documentation

## 2023-08-19 DIAGNOSIS — Z794 Long term (current) use of insulin: Secondary | ICD-10-CM | POA: Insufficient documentation

## 2023-08-19 DIAGNOSIS — E1140 Type 2 diabetes mellitus with diabetic neuropathy, unspecified: Secondary | ICD-10-CM | POA: Insufficient documentation

## 2023-08-19 DIAGNOSIS — K250 Acute gastric ulcer with hemorrhage: Secondary | ICD-10-CM | POA: Diagnosis not present

## 2023-08-19 DIAGNOSIS — E66813 Obesity, class 3: Secondary | ICD-10-CM | POA: Diagnosis not present

## 2023-08-19 DIAGNOSIS — I5032 Chronic diastolic (congestive) heart failure: Secondary | ICD-10-CM | POA: Insufficient documentation

## 2023-08-19 DIAGNOSIS — S301XXA Contusion of abdominal wall, initial encounter: Secondary | ICD-10-CM | POA: Insufficient documentation

## 2023-08-19 DIAGNOSIS — E1165 Type 2 diabetes mellitus with hyperglycemia: Secondary | ICD-10-CM | POA: Insufficient documentation

## 2023-08-19 DIAGNOSIS — Z1152 Encounter for screening for COVID-19: Secondary | ICD-10-CM | POA: Insufficient documentation

## 2023-08-19 DIAGNOSIS — J189 Pneumonia, unspecified organism: Secondary | ICD-10-CM | POA: Diagnosis not present

## 2023-08-19 DIAGNOSIS — E1169 Type 2 diabetes mellitus with other specified complication: Secondary | ICD-10-CM | POA: Diagnosis not present

## 2023-08-19 DIAGNOSIS — R739 Hyperglycemia, unspecified: Secondary | ICD-10-CM

## 2023-08-19 DIAGNOSIS — I129 Hypertensive chronic kidney disease with stage 1 through stage 4 chronic kidney disease, or unspecified chronic kidney disease: Secondary | ICD-10-CM | POA: Insufficient documentation

## 2023-08-19 DIAGNOSIS — F411 Generalized anxiety disorder: Secondary | ICD-10-CM | POA: Diagnosis not present

## 2023-08-19 DIAGNOSIS — K254 Chronic or unspecified gastric ulcer with hemorrhage: Secondary | ICD-10-CM

## 2023-08-19 DIAGNOSIS — F339 Major depressive disorder, recurrent, unspecified: Secondary | ICD-10-CM | POA: Diagnosis not present

## 2023-08-19 HISTORY — PX: ESOPHAGOGASTRODUODENOSCOPY (EGD) WITH PROPOFOL: SHX5813

## 2023-08-19 HISTORY — PX: BIOPSY: SHX5522

## 2023-08-19 LAB — CBC
HCT: 27.4 % — ABNORMAL LOW (ref 36.0–46.0)
HCT: 30.9 % — ABNORMAL LOW (ref 36.0–46.0)
Hemoglobin: 8.9 g/dL — ABNORMAL LOW (ref 12.0–15.0)
Hemoglobin: 9.5 g/dL — ABNORMAL LOW (ref 12.0–15.0)
MCH: 28.9 pg (ref 26.0–34.0)
MCH: 28.1 pg (ref 26.0–34.0)
MCHC: 32.5 g/dL (ref 30.0–36.0)
MCHC: 30.7 g/dL (ref 30.0–36.0)
MCV: 89 fL (ref 80.0–100.0)
MCV: 91.4 fL (ref 80.0–100.0)
Platelets: 367 10*3/uL (ref 150–400)
Platelets: 401 10*3/uL — ABNORMAL HIGH (ref 150–400)
RBC: 3.08 MIL/uL — ABNORMAL LOW (ref 3.87–5.11)
RBC: 3.38 MIL/uL — ABNORMAL LOW (ref 3.87–5.11)
RDW: 13 % (ref 11.5–15.5)
RDW: 13 % (ref 11.5–15.5)
WBC: 9.6 10*3/uL (ref 4.0–10.5)
WBC: 10 10*3/uL (ref 4.0–10.5)
nRBC: 0 % (ref 0.0–0.2)
nRBC: 0 % (ref 0.0–0.2)

## 2023-08-19 LAB — URINALYSIS, ROUTINE W REFLEX MICROSCOPIC
Bacteria, UA: NONE SEEN
Bilirubin Urine: NEGATIVE
Glucose, UA: >=500 mg/dL — AB
Ketones, ur: NEGATIVE mg/dL
Leukocytes,Ua: NEGATIVE
Nitrite: NEGATIVE
Protein, ur: 100 mg/dL — AB
Specific Gravity, Urine: 1.018 (ref 1.005–1.030)
pH: 6 (ref 5.0–8.0)

## 2023-08-19 LAB — GLUCOSE, CAPILLARY
Glucose-Capillary: 262 mg/dL — ABNORMAL HIGH (ref 70–99)
Glucose-Capillary: 264 mg/dL — ABNORMAL HIGH (ref 70–99)
Glucose-Capillary: 308 mg/dL — ABNORMAL HIGH (ref 70–99)
Glucose-Capillary: 329 mg/dL — ABNORMAL HIGH (ref 70–99)

## 2023-08-19 LAB — BASIC METABOLIC PANEL
Anion gap: 10 (ref 5–15)
BUN: 26 mg/dL — ABNORMAL HIGH (ref 6–20)
CO2: 23 mmol/L (ref 22–32)
Calcium: 8.3 mg/dL — ABNORMAL LOW (ref 8.9–10.3)
Chloride: 101 mmol/L (ref 98–111)
Creatinine, Ser: 1.35 mg/dL — ABNORMAL HIGH (ref 0.44–1.00)
GFR, Estimated: 46 mL/min — ABNORMAL LOW (ref >60)
Glucose, Bld: 301 mg/dL — ABNORMAL HIGH (ref 70–99)
Potassium: 3.3 mmol/L — ABNORMAL LOW (ref 3.5–5.1)
Sodium: 134 mmol/L — ABNORMAL LOW (ref 135–145)

## 2023-08-19 LAB — COMPREHENSIVE METABOLIC PANEL
ALT: 26 U/L (ref 0–44)
AST: 16 U/L (ref 15–41)
Albumin: 2.9 g/dL — ABNORMAL LOW (ref 3.5–5.0)
Alkaline Phosphatase: 126 U/L (ref 38–126)
Anion gap: 13 (ref 5–15)
BUN: 29 mg/dL — ABNORMAL HIGH (ref 6–20)
CO2: 20 mmol/L — ABNORMAL LOW (ref 22–32)
Calcium: 8.2 mg/dL — ABNORMAL LOW (ref 8.9–10.3)
Chloride: 96 mmol/L — ABNORMAL LOW (ref 98–111)
Creatinine, Ser: 1.82 mg/dL — ABNORMAL HIGH (ref 0.44–1.00)
GFR, Estimated: 32 mL/min — ABNORMAL LOW (ref >60)
Glucose, Bld: 713 mg/dL (ref 70–99)
Potassium: 4.4 mmol/L (ref 3.5–5.1)
Sodium: 129 mmol/L — ABNORMAL LOW (ref 135–145)
Total Bilirubin: 0.4 mg/dL (ref 0.3–1.2)
Total Protein: 7.8 g/dL (ref 6.5–8.1)

## 2023-08-19 LAB — LIPASE, BLOOD: Lipase: 75 U/L — ABNORMAL HIGH (ref 11–51)

## 2023-08-19 SURGERY — ESOPHAGOGASTRODUODENOSCOPY (EGD) WITH PROPOFOL
Anesthesia: Monitor Anesthesia Care

## 2023-08-19 MED ORDER — INSULIN GLARGINE-YFGN 100 UNIT/ML ~~LOC~~ SOLN
15.0000 [IU] | Freq: Every day | SUBCUTANEOUS | Status: DC
  Administered 2023-08-19: 15 [IU] via SUBCUTANEOUS
  Filled 2023-08-19: qty 0.15

## 2023-08-19 MED ORDER — LACTATED RINGERS IV SOLN
INTRAVENOUS | Status: DC | PRN
Start: 2023-08-19 — End: 2023-08-19

## 2023-08-19 MED ORDER — PROPOFOL 500 MG/50ML IV EMUL
INTRAVENOUS | Status: DC | PRN
  Administered 2023-08-19: 75 ug/kg/min via INTRAVENOUS

## 2023-08-19 MED ORDER — PANTOPRAZOLE SODIUM 40 MG PO TBEC: 40.0000 mg | DELAYED_RELEASE_TABLET | Freq: Two times a day (BID) | ORAL | 0 refills | Status: AC

## 2023-08-19 MED ORDER — ONDANSETRON HCL 4 MG/2ML IJ SOLN
INTRAMUSCULAR | Status: DC | PRN
Start: 2023-08-19 — End: 2023-08-19
  Administered 2023-08-19: 4 mg via INTRAVENOUS

## 2023-08-19 MED ORDER — LIDOCAINE HCL (CARDIAC) PF 100 MG/5ML IV SOSY
PREFILLED_SYRINGE | INTRAVENOUS | Status: DC | PRN
  Administered 2023-08-19: 100 mg via INTRAVENOUS

## 2023-08-19 MED ORDER — ASPIRIN 81 MG PO TBEC: 81.0000 mg | DELAYED_RELEASE_TABLET | Freq: Every day | ORAL | 11 refills | Status: AC

## 2023-08-19 MED ORDER — PROPOFOL 10 MG/ML IV BOLUS
INTRAVENOUS | Status: DC | PRN
  Administered 2023-08-19 (×5): 20 mg via INTRAVENOUS

## 2023-08-19 MED ORDER — INSULIN ASPART 100 UNIT/ML IJ SOLN
2.0000 [IU] | Freq: Once | INTRAMUSCULAR | Status: AC
  Administered 2023-08-19: 2 [IU] via SUBCUTANEOUS

## 2023-08-19 MED ORDER — TORSEMIDE 20 MG PO TABS: 20.0000 mg | ORAL_TABLET | Freq: Every day | ORAL | 0 refills | Status: AC

## 2023-08-19 MED ORDER — DEXAMETHASONE SODIUM PHOSPHATE 10 MG/ML IJ SOLN
INTRAMUSCULAR | Status: DC | PRN
Start: 2023-08-19 — End: 2023-08-19
  Administered 2023-08-19: 8 mg via INTRAVENOUS

## 2023-08-19 MED ORDER — PANTOPRAZOLE SODIUM 40 MG PO TBEC: 40.0000 mg | DELAYED_RELEASE_TABLET | Freq: Two times a day (BID) | ORAL | Status: DC

## 2023-08-19 SURGICAL SUPPLY — 15 items

## 2023-08-19 NOTE — Discharge Summary (Signed)
Physician Discharge Summary  Hailey Klein AOZ:[id number] DOB: May 23, 1966 DOA: 08/15/2023  PCP: Pcp, No  Admit date: 08/15/2023 Discharge date: 08/19/2023  Admitted From: Home Disposition: Home  Recommendations for Outpatient Follow-up:  Follow up with PCP in 1-2 weeks Follow-up with GI in regards to biopsy, may need new prescription pending H. pylori results  Home Health: None Equipment/Devices: None  Discharge Condition: Stable CODE STATUS: Full Diet recommendation: Low-salt low-fat low-carb diet  Brief/Interim Summary: Hailey Klein is a 57 y.o. 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 She has reported multiple times to our ED over the past week with worsening shortness of breath for previously treated pneumonia given worsening imaging and symptoms patient will be admitted for failure of outpatient therapy given her worsening pneumonia despite reported compliance with antibiotics. Hospitalist called to admit in the setting of failure of outpatient antibiotics with Augmentin/z-pack.   Patient initially admitted for presumed failure of outpatient biotics with [facility] pneumonia, now having completed antibiotic course here.  Hospitalization was complicated by worsening anemia, noted to have dark stools concerning for GI bleed.  Endoscopy on 08/19/2023 did not show what appears to be a healing gastric ulcer, likely the source of patient's prior bleeding.  GI recommending PPI twice daily holding aspirin and NSAIDs for additional 48 hours at minimum with close outpatient follow-up, H. pylori testing pending.  Plan for follow-up with Dr.  Barron Alvine for further discussion about possible colonoscopy given risk factors(questionable diagnosis of colon cancer in her mother)  Discharge Diagnoses:  Principal Problem:   Pneumonia Active Problems:   Community acquired pneumonia   Melena   Acute blood loss anemia   Heme positive stool   Gastric ulcer with hemorrhage  Acute respiratory failure without hypoxia secondary to multifocal/bilateral community acquired pneumonia with failure of outpatient therapy - Patient does not meet sepsis criteria - Imaging shows evolving pneumonia - Markedly dyspneic from her baseline - walking O2 screen q-shift - Reported failure of outpatient Augmentin/Z-pack -Completed IV antibiotics 08/19/2023   Acute anemia, questionably symptomatic Rule out GI bleed -Black tarry stools resolving, EGD shows healing gastric ulcer, presumed likely source of prior bleed -Hemoglobin continues to improve, status post 1 unit PRBC transfused 10/3 -Follow-up outpatient with GI   Heart failure, grade 1 diastolic, questionable exacerbation versus volume overload  -Transition back to home diuretics; fluid restriction salt restriction as discussed -Echo March 2024 shows grade 1 diastolic dysfunction EF 65 to 96%   PE ruled out - Less likely - personally reviewed CTA without any overt filling defects - formal read pending  T2D uncontrolled w/ hyperglycemia A1C 11.0 07/2023 - continue sliding scale/hypoglycemic protocol Added 10u glargine (28 at home)   HLD HTN PTSD Major depression Suicidal ideation history CKD3b Near baseline- continue home medications - no changes to home therapy at this time unless otherwise specified   Illicit substance use/abuse - admits to cocaine use earlier this week ?DVT/PT history(distant - not currently being treated -current studies are negative) ?History of malingering per chart review - follow clinically -review of systems somewhat limited in the setting  Discharge  Instructions   Allergies as of 08/19/2023       Reactions   Aspirin Rash, Other (See Comments)   high  high doses ago    Consultations: GI  Procedures/Studies: VAS [place] LOWER EXTREMITY VENOUS (DVT) (7a-7p)  Result Date: 08/15/2023  Lower Venous DVT Study Patient Name:  ENSLIE Klein  Date of Exam:   08/15/2023 Medical Rec #: [id number]        Accession #:    [id number] Date of Birth: [birth year]         Patient Gender: F Patient Age:   57 years Exam Location:  [facility] Procedure:      VAS [place] LOWER EXTREMITY VENOUS (DVT) Referring Phys: Delice Bison --------------------------------------------------------------------------------   Indications: Swelling, Pain, SOB, and Pneumonia for 2 days.  Risk Factors: Obesity. Limitations: Body habitus and poor ultrasound/tissue interface. Comparison Study: 02/01/23 Negative. Performing Technologist: Marilynne Halsted RDMS, RVT  Examination Guidelines: A complete evaluation includes B-mode imaging, spectral Doppler, color Doppler, and power Doppler as needed of all accessible portions of each vessel. Bilateral testing is considered an integral part of a complete examination. Limited examinations for reoccurring indications may be performed as noted. The reflux portion of the exam is performed with the patient in reverse Trendelenburg.  +---------+---------------+---------+-----------+----------+--------------+ RIGHT    CompressibilityPhasicitySpontaneityPropertiesThrombus Aging +---------+---------------+---------+-----------+----------+--------------+ CFV      Full           Yes      Yes                                 +---------+---------------+---------+-----------+----------+--------------+ SFJ      Full                                                        +---------+---------------+---------+-----------+----------+--------------+ FV Prox  Full                                                        +---------+---------------+---------+-----------+----------+--------------+ FV Mid   Full                                                        +---------+---------------+---------+-----------+----------+--------------+ FV DistalFull                                                        +---------+---------------+---------+-----------+----------+--------------+ PFV      Full                                                        +---------+---------------+---------+-----------+----------+--------------+ POP      Full           Yes      Yes                                 +---------+---------------+---------+-----------+----------+--------------+  Physician Discharge Summary  Hailey Klein AOZ:[id number] DOB: May 23, 1966 DOA: 08/15/2023  PCP: Pcp, No  Admit date: 08/15/2023 Discharge date: 08/19/2023  Admitted From: Home Disposition: Home  Recommendations for Outpatient Follow-up:  Follow up with PCP in 1-2 weeks Follow-up with GI in regards to biopsy, may need new prescription pending H. pylori results  Home Health: None Equipment/Devices: None  Discharge Condition: Stable CODE STATUS: Full Diet recommendation: Low-salt low-fat low-carb diet  Brief/Interim Summary: Hailey Klein is a 57 y.o. 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 She has reported multiple times to our ED over the past week with worsening shortness of breath for previously treated pneumonia given worsening imaging and symptoms patient will be admitted for failure of outpatient therapy given her worsening pneumonia despite reported compliance with antibiotics. Hospitalist called to admit in the setting of failure of outpatient antibiotics with Augmentin/z-pack.   Patient initially admitted for presumed failure of outpatient biotics with [facility] pneumonia, now having completed antibiotic course here.  Hospitalization was complicated by worsening anemia, noted to have dark stools concerning for GI bleed.  Endoscopy on 08/19/2023 did not show what appears to be a healing gastric ulcer, likely the source of patient's prior bleeding.  GI recommending PPI twice daily holding aspirin and NSAIDs for additional 48 hours at minimum with close outpatient follow-up, H. pylori testing pending.  Plan for follow-up with Dr.  Barron Alvine for further discussion about possible colonoscopy given risk factors(questionable diagnosis of colon cancer in her mother)  Discharge Diagnoses:  Principal Problem:   Pneumonia Active Problems:   Community acquired pneumonia   Melena   Acute blood loss anemia   Heme positive stool   Gastric ulcer with hemorrhage  Acute respiratory failure without hypoxia secondary to multifocal/bilateral community acquired pneumonia with failure of outpatient therapy - Patient does not meet sepsis criteria - Imaging shows evolving pneumonia - Markedly dyspneic from her baseline - walking O2 screen q-shift - Reported failure of outpatient Augmentin/Z-pack -Completed IV antibiotics 08/19/2023   Acute anemia, questionably symptomatic Rule out GI bleed -Black tarry stools resolving, EGD shows healing gastric ulcer, presumed likely source of prior bleed -Hemoglobin continues to improve, status post 1 unit PRBC transfused 10/3 -Follow-up outpatient with GI   Heart failure, grade 1 diastolic, questionable exacerbation versus volume overload  -Transition back to home diuretics; fluid restriction salt restriction as discussed -Echo March 2024 shows grade 1 diastolic dysfunction EF 65 to 96%   PE ruled out - Less likely - personally reviewed CTA without any overt filling defects - formal read pending  T2D uncontrolled w/ hyperglycemia A1C 11.0 07/2023 - continue sliding scale/hypoglycemic protocol Added 10u glargine (28 at home)   HLD HTN PTSD Major depression Suicidal ideation history CKD3b Near baseline- continue home medications - no changes to home therapy at this time unless otherwise specified   Illicit substance use/abuse - admits to cocaine use earlier this week ?DVT/PT history(distant - not currently being treated -current studies are negative) ?History of malingering per chart review - follow clinically -review of systems somewhat limited in the setting  Discharge  Instructions   Allergies as of 08/19/2023       Reactions   Aspirin Rash, Other (See Comments)   high  Physician Discharge Summary  Hailey Klein AOZ:[id number] DOB: May 23, 1966 DOA: 08/15/2023  PCP: Pcp, No  Admit date: 08/15/2023 Discharge date: 08/19/2023  Admitted From: Home Disposition: Home  Recommendations for Outpatient Follow-up:  Follow up with PCP in 1-2 weeks Follow-up with GI in regards to biopsy, may need new prescription pending H. pylori results  Home Health: None Equipment/Devices: None  Discharge Condition: Stable CODE STATUS: Full Diet recommendation: Low-salt low-fat low-carb diet  Brief/Interim Summary: Hailey Klein is a 57 y.o. 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 She has reported multiple times to our ED over the past week with worsening shortness of breath for previously treated pneumonia given worsening imaging and symptoms patient will be admitted for failure of outpatient therapy given her worsening pneumonia despite reported compliance with antibiotics. Hospitalist called to admit in the setting of failure of outpatient antibiotics with Augmentin/z-pack.   Patient initially admitted for presumed failure of outpatient biotics with [facility] pneumonia, now having completed antibiotic course here.  Hospitalization was complicated by worsening anemia, noted to have dark stools concerning for GI bleed.  Endoscopy on 08/19/2023 did not show what appears to be a healing gastric ulcer, likely the source of patient's prior bleeding.  GI recommending PPI twice daily holding aspirin and NSAIDs for additional 48 hours at minimum with close outpatient follow-up, H. pylori testing pending.  Plan for follow-up with Dr.  Barron Alvine for further discussion about possible colonoscopy given risk factors(questionable diagnosis of colon cancer in her mother)  Discharge Diagnoses:  Principal Problem:   Pneumonia Active Problems:   Community acquired pneumonia   Melena   Acute blood loss anemia   Heme positive stool   Gastric ulcer with hemorrhage  Acute respiratory failure without hypoxia secondary to multifocal/bilateral community acquired pneumonia with failure of outpatient therapy - Patient does not meet sepsis criteria - Imaging shows evolving pneumonia - Markedly dyspneic from her baseline - walking O2 screen q-shift - Reported failure of outpatient Augmentin/Z-pack -Completed IV antibiotics 08/19/2023   Acute anemia, questionably symptomatic Rule out GI bleed -Black tarry stools resolving, EGD shows healing gastric ulcer, presumed likely source of prior bleed -Hemoglobin continues to improve, status post 1 unit PRBC transfused 10/3 -Follow-up outpatient with GI   Heart failure, grade 1 diastolic, questionable exacerbation versus volume overload  -Transition back to home diuretics; fluid restriction salt restriction as discussed -Echo March 2024 shows grade 1 diastolic dysfunction EF 65 to 96%   PE ruled out - Less likely - personally reviewed CTA without any overt filling defects - formal read pending  T2D uncontrolled w/ hyperglycemia A1C 11.0 07/2023 - continue sliding scale/hypoglycemic protocol Added 10u glargine (28 at home)   HLD HTN PTSD Major depression Suicidal ideation history CKD3b Near baseline- continue home medications - no changes to home therapy at this time unless otherwise specified   Illicit substance use/abuse - admits to cocaine use earlier this week ?DVT/PT history(distant - not currently being treated -current studies are negative) ?History of malingering per chart review - follow clinically -review of systems somewhat limited in the setting  Discharge  Instructions   Allergies as of 08/19/2023       Reactions   Aspirin Rash, Other (See Comments)   high  Physician Discharge Summary  Hailey Klein AOZ:[id number] DOB: May 23, 1966 DOA: 08/15/2023  PCP: Pcp, No  Admit date: 08/15/2023 Discharge date: 08/19/2023  Admitted From: Home Disposition: Home  Recommendations for Outpatient Follow-up:  Follow up with PCP in 1-2 weeks Follow-up with GI in regards to biopsy, may need new prescription pending H. pylori results  Home Health: None Equipment/Devices: None  Discharge Condition: Stable CODE STATUS: Full Diet recommendation: Low-salt low-fat low-carb diet  Brief/Interim Summary: Hailey Klein is a 57 y.o. 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 She has reported multiple times to our ED over the past week with worsening shortness of breath for previously treated pneumonia given worsening imaging and symptoms patient will be admitted for failure of outpatient therapy given her worsening pneumonia despite reported compliance with antibiotics. Hospitalist called to admit in the setting of failure of outpatient antibiotics with Augmentin/z-pack.   Patient initially admitted for presumed failure of outpatient biotics with [facility] pneumonia, now having completed antibiotic course here.  Hospitalization was complicated by worsening anemia, noted to have dark stools concerning for GI bleed.  Endoscopy on 08/19/2023 did not show what appears to be a healing gastric ulcer, likely the source of patient's prior bleeding.  GI recommending PPI twice daily holding aspirin and NSAIDs for additional 48 hours at minimum with close outpatient follow-up, H. pylori testing pending.  Plan for follow-up with Dr.  Barron Alvine for further discussion about possible colonoscopy given risk factors(questionable diagnosis of colon cancer in her mother)  Discharge Diagnoses:  Principal Problem:   Pneumonia Active Problems:   Community acquired pneumonia   Melena   Acute blood loss anemia   Heme positive stool   Gastric ulcer with hemorrhage  Acute respiratory failure without hypoxia secondary to multifocal/bilateral community acquired pneumonia with failure of outpatient therapy - Patient does not meet sepsis criteria - Imaging shows evolving pneumonia - Markedly dyspneic from her baseline - walking O2 screen q-shift - Reported failure of outpatient Augmentin/Z-pack -Completed IV antibiotics 08/19/2023   Acute anemia, questionably symptomatic Rule out GI bleed -Black tarry stools resolving, EGD shows healing gastric ulcer, presumed likely source of prior bleed -Hemoglobin continues to improve, status post 1 unit PRBC transfused 10/3 -Follow-up outpatient with GI   Heart failure, grade 1 diastolic, questionable exacerbation versus volume overload  -Transition back to home diuretics; fluid restriction salt restriction as discussed -Echo March 2024 shows grade 1 diastolic dysfunction EF 65 to 96%   PE ruled out - Less likely - personally reviewed CTA without any overt filling defects - formal read pending  T2D uncontrolled w/ hyperglycemia A1C 11.0 07/2023 - continue sliding scale/hypoglycemic protocol Added 10u glargine (28 at home)   HLD HTN PTSD Major depression Suicidal ideation history CKD3b Near baseline- continue home medications - no changes to home therapy at this time unless otherwise specified   Illicit substance use/abuse - admits to cocaine use earlier this week ?DVT/PT history(distant - not currently being treated -current studies are negative) ?History of malingering per chart review - follow clinically -review of systems somewhat limited in the setting  Discharge  Instructions   Allergies as of 08/19/2023       Reactions   Aspirin Rash, Other (See Comments)   high  Physician Discharge Summary  Hailey Klein AOZ:[id number] DOB: May 23, 1966 DOA: 08/15/2023  PCP: Pcp, No  Admit date: 08/15/2023 Discharge date: 08/19/2023  Admitted From: Home Disposition: Home  Recommendations for Outpatient Follow-up:  Follow up with PCP in 1-2 weeks Follow-up with GI in regards to biopsy, may need new prescription pending H. pylori results  Home Health: None Equipment/Devices: None  Discharge Condition: Stable CODE STATUS: Full Diet recommendation: Low-salt low-fat low-carb diet  Brief/Interim Summary: Hailey Klein is a 57 y.o. 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 She has reported multiple times to our ED over the past week with worsening shortness of breath for previously treated pneumonia given worsening imaging and symptoms patient will be admitted for failure of outpatient therapy given her worsening pneumonia despite reported compliance with antibiotics. Hospitalist called to admit in the setting of failure of outpatient antibiotics with Augmentin/z-pack.   Patient initially admitted for presumed failure of outpatient biotics with [facility] pneumonia, now having completed antibiotic course here.  Hospitalization was complicated by worsening anemia, noted to have dark stools concerning for GI bleed.  Endoscopy on 08/19/2023 did not show what appears to be a healing gastric ulcer, likely the source of patient's prior bleeding.  GI recommending PPI twice daily holding aspirin and NSAIDs for additional 48 hours at minimum with close outpatient follow-up, H. pylori testing pending.  Plan for follow-up with Dr.  Barron Alvine for further discussion about possible colonoscopy given risk factors(questionable diagnosis of colon cancer in her mother)  Discharge Diagnoses:  Principal Problem:   Pneumonia Active Problems:   Community acquired pneumonia   Melena   Acute blood loss anemia   Heme positive stool   Gastric ulcer with hemorrhage  Acute respiratory failure without hypoxia secondary to multifocal/bilateral community acquired pneumonia with failure of outpatient therapy - Patient does not meet sepsis criteria - Imaging shows evolving pneumonia - Markedly dyspneic from her baseline - walking O2 screen q-shift - Reported failure of outpatient Augmentin/Z-pack -Completed IV antibiotics 08/19/2023   Acute anemia, questionably symptomatic Rule out GI bleed -Black tarry stools resolving, EGD shows healing gastric ulcer, presumed likely source of prior bleed -Hemoglobin continues to improve, status post 1 unit PRBC transfused 10/3 -Follow-up outpatient with GI   Heart failure, grade 1 diastolic, questionable exacerbation versus volume overload  -Transition back to home diuretics; fluid restriction salt restriction as discussed -Echo March 2024 shows grade 1 diastolic dysfunction EF 65 to 96%   PE ruled out - Less likely - personally reviewed CTA without any overt filling defects - formal read pending  T2D uncontrolled w/ hyperglycemia A1C 11.0 07/2023 - continue sliding scale/hypoglycemic protocol Added 10u glargine (28 at home)   HLD HTN PTSD Major depression Suicidal ideation history CKD3b Near baseline- continue home medications - no changes to home therapy at this time unless otherwise specified   Illicit substance use/abuse - admits to cocaine use earlier this week ?DVT/PT history(distant - not currently being treated -current studies are negative) ?History of malingering per chart review - follow clinically -review of systems somewhat limited in the setting  Discharge  Instructions   Allergies as of 08/19/2023       Reactions   Aspirin Rash, Other (See Comments)   high  high doses ago    Consultations: GI  Procedures/Studies: VAS [place] LOWER EXTREMITY VENOUS (DVT) (7a-7p)  Result Date: 08/15/2023  Lower Venous DVT Study Patient Name:  ENSLIE Klein  Date of Exam:   08/15/2023 Medical Rec #: [id number]        Accession #:    [id number] Date of Birth: [birth year]         Patient Gender: F Patient Age:   57 years Exam Location:  [facility] Procedure:      VAS [place] LOWER EXTREMITY VENOUS (DVT) Referring Phys: Delice Bison --------------------------------------------------------------------------------   Indications: Swelling, Pain, SOB, and Pneumonia for 2 days.  Risk Factors: Obesity. Limitations: Body habitus and poor ultrasound/tissue interface. Comparison Study: 02/01/23 Negative. Performing Technologist: Marilynne Halsted RDMS, RVT  Examination Guidelines: A complete evaluation includes B-mode imaging, spectral Doppler, color Doppler, and power Doppler as needed of all accessible portions of each vessel. Bilateral testing is considered an integral part of a complete examination. Limited examinations for reoccurring indications may be performed as noted. The reflux portion of the exam is performed with the patient in reverse Trendelenburg.  +---------+---------------+---------+-----------+----------+--------------+ RIGHT    CompressibilityPhasicitySpontaneityPropertiesThrombus Aging +---------+---------------+---------+-----------+----------+--------------+ CFV      Full           Yes      Yes                                 +---------+---------------+---------+-----------+----------+--------------+ SFJ      Full                                                        +---------+---------------+---------+-----------+----------+--------------+ FV Prox  Full                                                        +---------+---------------+---------+-----------+----------+--------------+ FV Mid   Full                                                        +---------+---------------+---------+-----------+----------+--------------+ FV DistalFull                                                        +---------+---------------+---------+-----------+----------+--------------+ PFV      Full                                                        +---------+---------------+---------+-----------+----------+--------------+ POP      Full           Yes      Yes                                 +---------+---------------+---------+-----------+----------+--------------+

## 2023-08-19 NOTE — Op Note (Signed)
[facility] Patient Name: Hailey Klein Procedure Date : 08/19/2023 MRN: [id number] Attending MD: Dub Amis. Tomasa Rand , MD, [id number] Date of Birth: 05/11/1966 CSN: [id number] Age: 57 Admit Type: Inpatient Procedure:                Upper GI endoscopy Indications:              Acute post hemorrhagic anemia, Melena Providers:                Lorin Picket E. Tomasa Rand, MD, Eliberto Ivory, RN, Marja Kays, Technician Referring MD:              Medicines:                Monitored Anesthesia Care Complications:            No immediate complications. Estimated Blood Loss:     Estimated blood loss was minimal. Procedure:                Pre-Anesthesia Assessment:                           - Prior to the procedure, a History and Physical                            was performed, and patient medications and                            allergies were reviewed. The patient's tolerance of                            previous anesthesia was also reviewed. The risks                            and benefits of the procedure and the sedation                            options and risks were discussed with the patient.                            All questions were answered, and informed consent                            was obtained. Prior Anticoagulants: The patient has                            taken no anticoagulant or antiplatelet agents                            except for aspirin. ASA Grade Assessment: IV - A                            patient with severe systemic disease that is a  constant threat to life. After reviewing the risks                            and benefits, the patient was deemed in                            satisfactory condition to undergo the procedure.                           After obtaining informed consent, the endoscope was                            passed under direct vision. Throughout the                             procedure, the patient's blood pressure, pulse, and                            oxygen saturations were monitored continuously. The                            [id number]-H190 ([id number]) Olympus endoscope was introduced                            through the mouth, and advanced to the second part                            of duodenum. The upper GI endoscopy was                            accomplished without difficulty. The patient                            tolerated the procedure well. Scope In: Scope Out: Findings:      The examined esophagus was normal.      A small amount of food (residue) was found in the gastric body.      Few non-bleeding superficial gastric ulcers with no stigmata of bleeding       were found in the gastric body. The largest lesion was 5 mm in largest       dimension. Biopsies of the ulcers as well as the gastric       antrum/prepylorus were taken with a cold forceps for Helicobacter pylori       testing . Estimated blood loss was minimal.      The exam of the stomach was otherwise normal.      A single erosion without bleeding was found in the duodenal bulb.      The examined duodenum was otherwise normal. Impression:               - Normal esophagus.                           - A small amount of food (residue) in the stomach.                           -  Non-bleeding gastric ulcers with no stigmata of                            bleeding. Biopsied. I suspect this is the source of                            the patient's melena and drop in hemoglobin.                           - Duodenal erosion without bleeding.                           - Normal examined duodenum. Moderate Sedation:      N/A Recommendation:           - Return patient to hospital ward for ongoing care.                           - Resume previous diet.                           - Continue present medications.                           - Await pathology results. If H.pylori present,                             will prescribe quadruple therapy.                           - Recommend holding aspirin for another 48 hours.                            Avoid all other NSAIDs                           - Recommend BID PPI x 8 weeks, and then once daily                            indefinitely                           - Will arrange outpatient follow up with Dr.                            Barron Alvine to consider repeat EGD to assess                            resolution of ulcers and colonoscopy                           - GI will sign off at this time. Procedure Code(s):        --- Professional ---                           [id number], Esophagogastroduodenoscopy, flexible,  transoral; with biopsy, single or multiple Diagnosis Code(s):        --- Professional ---                           K25.9, Gastric ulcer, unspecified as acute or                            chronic, without hemorrhage or perforation                           K26.9, Duodenal ulcer, unspecified as acute or                            chronic, without hemorrhage or perforation                           D62, Acute posthemorrhagic anemia                           K92.1, Melena (includes Hematochezia) CPT copyright 2022 American [employer] Association. All rights reserved. The codes documented in this report are preliminary and upon coder review may  be revised to meet current compliance requirements. Skylynn Burkley E. Tomasa Rand, MD 08/19/2023 10:14:30 AM This report has been signed electronically. Number of Addenda: 0

## 2023-08-19 NOTE — TOC Transition Note (Signed)
Transition of Care [facility]) - CM/SW Discharge Note   Patient Details  Name: Hailey Klein MRN: [id number] Date of Birth: Jun 12, 1966  [facility] CM/SW Contact:  Lawerance Sabal, RN Phone Number: 08/19/2023, 12:09 PM   Clinical Narrative:     [name] w patient over the phone. She states that the pharmacy her meds were sent to will mail her scripts to her and she won't have to go to [facility] to get them. Patient is covered through [facility]. Weekday CM has placed bus passes on chart.   Final next level of care: Home/Self Care Barriers to Discharge: Continued Medical Work up   Patient Goals and CMS Choice CMS Medicare.gov Compare Post Acute Care list provided to:: Patient Choice offered to / list presented to : Patient  Discharge Placement                         Discharge Plan and Services Additional resources added to the After Visit Summary for     Discharge Planning Services: CM Consult Post Acute Care Choice: Resumption of Svcs/PTA Provider                               Social Determinants of Health (SDOH) Interventions SDOH Screenings   Food Insecurity: No Food Insecurity (08/15/2023)  Recent Concern: Food Insecurity - Food Insecurity Present (08/01/2023)  Housing: Low Risk  (08/15/2023)  Recent Concern: Housing - High Risk (08/01/2023)  Transportation Needs: No Transportation Needs (08/15/2023)  Recent Concern: Transportation Needs - Unmet Transportation Needs (08/01/2023)  Utilities: Not At Risk (08/15/2023)  Alcohol Screen: Low Risk  (08/03/2023)  Financial Resource Strain: Medium Risk (07/11/2022)  Tobacco Use: High Risk (08/19/2023)     Readmission Risk Interventions    06/23/2022    4:53 PM  Readmission Risk Prevention Plan  Transportation Screening Complete  PCP or Specialist Appt within 3-5 Days Complete  HRI or Home Care Consult Complete  Social Work Consult for Recovery Care Planning/Counseling Complete  Palliative Care Screening  Not Applicable  Medication Review Oceanographer) Complete

## 2023-08-19 NOTE — ED Triage Notes (Signed)
The pt was just discharged today from this hospital she reports that she is weak and she almost passed out at home abd pain

## 2023-08-19 NOTE — Anesthesia Preprocedure Evaluation (Addendum)
Anesthesia Evaluation  Patient identified by MRN, date of birth, ID band Patient awake    Reviewed: Allergy & Precautions, NPO status , Patient's Chart, lab work & pertinent test results  Airway Mallampati: II  TM Distance: >3 FB Neck ROM: Full    Dental no notable dental hx.    Pulmonary Current Smoker and Patient abstained from smoking.   Pulmonary exam normal        Cardiovascular hypertension, Pt. on medications + CAD and +CHF  Normal cardiovascular exam  ECHO: 1. Left ventricular ejection fraction, by estimation, is 65 to 70%. The  left ventricle has normal function. The left ventricle has no regional  wall motion abnormalities. Left ventricular diastolic parameters are  consistent with Grade I diastolic  dysfunction (impaired relaxation).   2. Right ventricular systolic function is normal. The right ventricular  size is normal.   3. The mitral valve is grossly normal. No evidence of mitral valve  regurgitation.   4. The aortic valve is tricuspid. Aortic valve regurgitation is not  visualized. No aortic stenosis is present.   5. The inferior vena cava is normal in size with greater than 50%  respiratory variability, suggesting right atrial pressure of 3 mmHg.     Neuro/Psych  Headaches PSYCHIATRIC DISORDERS Anxiety Depression    PTSD (post-traumatic stress disorder Neuromuscular disease    GI/Hepatic negative GI ROS,,,(+)     substance abuse    Endo/Other  diabetes, Insulin Dependent  Morbid obesity  Renal/GU Renal InsufficiencyRenal disease     Musculoskeletal negative musculoskeletal ROS (+)    Abdominal  (+) + obese  Peds  Hematology  (+) Blood dyscrasia, anemia   Anesthesia Other Findings anemia  black stool  Reproductive/Obstetrics                             Anesthesia Physical Anesthesia Plan  ASA: 4  Anesthesia Plan: MAC   Post-op Pain Management:    Induction:  Intravenous  PONV Risk Score and Plan: 1 and Propofol infusion and Treatment may vary due to age or medical condition  Airway Management Planned: Nasal Cannula  Additional Equipment:   Intra-op Plan:   Post-operative Plan:   Informed Consent: I have reviewed the patients History and Physical, chart, labs and discussed the procedure including the risks, benefits and alternatives for the proposed anesthesia with the patient or authorized representative who has indicated his/her understanding and acceptance.     Dental advisory given  Plan Discussed with: CRNA  Anesthesia Plan Comments:         Anesthesia Quick Evaluation

## 2023-08-19 NOTE — Anesthesia Postprocedure Evaluation (Signed)
Anesthesia Post Note  Patient: Hailey Klein  Procedure(s) Performed: ESOPHAGOGASTRODUODENOSCOPY (EGD) WITH PROPOFOL BIOPSY     Patient location during evaluation: PACU Anesthesia Type: MAC Level of consciousness: awake Pain management: pain level controlled Vital Signs Assessment: post-procedure vital signs reviewed and stable Respiratory status: spontaneous breathing, nonlabored ventilation and respiratory function stable Cardiovascular status: blood pressure returned to baseline and stable Postop Assessment: no apparent nausea or vomiting Anesthetic complications: no   No notable events documented.  Last Vitals:  Vitals:   08/19/23 1003 08/19/23 1013  BP: (!) 107/59 120/65  Pulse: 71 71  Resp: (!) 24 20  Temp: 36.4 C 36.4 C  SpO2: 100% 99%    Last Pain:  Vitals:   08/19/23 1003  TempSrc:   PainSc: 0-No pain                 Latroya Ng P Valgene Deloatch

## 2023-08-19 NOTE — Progress Notes (Addendum)
Patient discharged.  Tech removed PIV and supplied patient with scrubs as per patient request.     Reviewed discharge instructions, medications and follow up appts with patient and boyfriend.  Answered questions. Gave patient copy of discharge instructions.  Prescriptions were sent to patient's pharmacy.    No additional questions or concerns at this time.  Tech wheeled patient down to front entrance.

## 2023-08-19 NOTE — Interval H&P Note (Signed)
History and Physical Interval Note:  08/19/2023 9:22 AM  Hailey Klein  has presented today for surgery, with the diagnosis of anemia, black stool.  The various methods of treatment have been discussed with the patient and family. After consideration of risks, benefits and other options for treatment, the patient has consented to  Procedure(s): ESOPHAGOGASTRODUODENOSCOPY (EGD) WITH PROPOFOL (N/A) as a surgical intervention.  The patient's history has been reviewed, patient examined, no change in status, stable for surgery.  I have reviewed the patient's chart and labs.  Questions were answered to the patient's satisfaction.     Jenel Lucks

## 2023-08-19 NOTE — Transfer of Care (Signed)
Immediate Anesthesia Transfer of Care Note  Patient: Hailey Klein  Procedure(s) Performed: ESOPHAGOGASTRODUODENOSCOPY (EGD) WITH PROPOFOL BIOPSY  Patient Location: PACU  Anesthesia Type:MAC  Level of Consciousness: awake, alert , oriented, and patient cooperative  Airway & Oxygen Therapy: Patient Spontanous Breathing  Post-op Assessment: Report given to RN and Post -op Vital signs reviewed and stable  Post vital signs: Reviewed and stable  Last Vitals:  Vitals Value Taken Time  BP 107/59 08/19/23 1002  Temp 36.4 C 08/19/23 1003  Pulse 72 08/19/23 1003  Resp 25 08/19/23 1003  SpO2 100 % 08/19/23 1003  Vitals shown include unfiled device data.  Last Pain:  Vitals:   08/19/23 0923  TempSrc: Temporal  PainSc: 8          Complications: No notable events documented.

## 2023-08-20 ENCOUNTER — Emergency Department (HOSPITAL_COMMUNITY)
Admission: EM | Admit: 2023-08-20 | Discharge: 2023-08-21 | Disposition: A | Discharge status: Home / Self Care | Payer: Medicaid Other | Source: Home / Self Care | Attending: Student | Admitting: Student

## 2023-08-20 ENCOUNTER — Other Ambulatory Visit: Payer: Self-pay

## 2023-08-20 ENCOUNTER — Encounter (HOSPITAL_COMMUNITY): Payer: Self-pay

## 2023-08-20 ENCOUNTER — Emergency Department (HOSPITAL_COMMUNITY): Payer: Medicaid Other

## 2023-08-20 DIAGNOSIS — E1122 Type 2 diabetes mellitus with diabetic chronic kidney disease: Secondary | ICD-10-CM | POA: Insufficient documentation

## 2023-08-20 DIAGNOSIS — N189 Chronic kidney disease, unspecified: Secondary | ICD-10-CM | POA: Insufficient documentation

## 2023-08-20 DIAGNOSIS — F1410 Cocaine abuse, uncomplicated: Secondary | ICD-10-CM | POA: Insufficient documentation

## 2023-08-20 DIAGNOSIS — E860 Dehydration: Secondary | ICD-10-CM | POA: Insufficient documentation

## 2023-08-20 DIAGNOSIS — E1140 Type 2 diabetes mellitus with diabetic neuropathy, unspecified: Secondary | ICD-10-CM | POA: Insufficient documentation

## 2023-08-20 DIAGNOSIS — Z79899 Other long term (current) drug therapy: Secondary | ICD-10-CM | POA: Insufficient documentation

## 2023-08-20 DIAGNOSIS — I2510 Atherosclerotic heart disease of native coronary artery without angina pectoris: Secondary | ICD-10-CM | POA: Insufficient documentation

## 2023-08-20 DIAGNOSIS — I509 Heart failure, unspecified: Secondary | ICD-10-CM | POA: Insufficient documentation

## 2023-08-20 DIAGNOSIS — I130 Hypertensive heart and chronic kidney disease with heart failure and stage 1 through stage 4 chronic kidney disease, or unspecified chronic kidney disease: Secondary | ICD-10-CM | POA: Insufficient documentation

## 2023-08-20 DIAGNOSIS — Z7982 Long term (current) use of aspirin: Secondary | ICD-10-CM | POA: Insufficient documentation

## 2023-08-20 DIAGNOSIS — R519 Headache, unspecified: Secondary | ICD-10-CM | POA: Insufficient documentation

## 2023-08-20 DIAGNOSIS — Z1152 Encounter for screening for COVID-19: Secondary | ICD-10-CM | POA: Insufficient documentation

## 2023-08-20 LAB — CULTURE, BLOOD (ROUTINE X 2)
Culture: NO GROWTH
Culture: NO GROWTH
Special Requests: ADEQUATE
Special Requests: ADEQUATE

## 2023-08-20 LAB — HEMOGLOBIN AND HEMATOCRIT, BLOOD
HCT: 29.3 % — ABNORMAL LOW (ref 36.0–46.0)
Hemoglobin: 9.3 g/dL — ABNORMAL LOW (ref 12.0–15.0)

## 2023-08-20 LAB — I-STAT VENOUS BLOOD GAS, ED
Acid-base deficit: 1 mmol/L (ref 0.0–2.0)
Bicarbonate: 21.8 mmol/L (ref 20.0–28.0)
Calcium, Ion: 0.99 mmol/L — ABNORMAL LOW (ref 1.15–1.40)
HCT: 27 % — ABNORMAL LOW (ref 36.0–46.0)
Hemoglobin: 9.2 g/dL — ABNORMAL LOW (ref 12.0–15.0)
O2 Saturation: 93 %
Potassium: 4.3 mmol/L (ref 3.5–5.1)
Sodium: 134 mmol/L — ABNORMAL LOW (ref 135–145)
TCO2: 23 mmol/L (ref 22–32)
pCO2, Ven: 30 mm[Hg] — ABNORMAL LOW (ref 44–60)
pH, Ven: 7.47 — ABNORMAL HIGH (ref 7.25–7.43)
pO2, Ven: 63 mm[Hg] — ABNORMAL HIGH (ref 32–45)

## 2023-08-20 LAB — CBG MONITORING, ED
Glucose-Capillary: 510 mg/dL (ref 70–99)
Glucose-Capillary: 504 mg/dL (ref 70–99)
Glucose-Capillary: 461 mg/dL — ABNORMAL HIGH (ref 70–99)
Glucose-Capillary: 374 mg/dL — ABNORMAL HIGH (ref 70–99)

## 2023-08-20 LAB — BASIC METABOLIC PANEL
Anion gap: 12 (ref 5–15)
BUN: 29 mg/dL — ABNORMAL HIGH (ref 6–20)
CO2: 21 mmol/L — ABNORMAL LOW (ref 22–32)
Calcium: 8.3 mg/dL — ABNORMAL LOW (ref 8.9–10.3)
Chloride: 100 mmol/L (ref 98–111)
Creatinine, Ser: 1.62 mg/dL — ABNORMAL HIGH (ref 0.44–1.00)
GFR, Estimated: 37 mL/min — ABNORMAL LOW (ref >60)
Glucose, Bld: 551 mg/dL (ref 70–99)
Potassium: 4.3 mmol/L (ref 3.5–5.1)
Sodium: 133 mmol/L — ABNORMAL LOW (ref 135–145)

## 2023-08-20 LAB — BRAIN NATRIURETIC PEPTIDE: B Natriuretic Peptide: 148.2 pg/mL — ABNORMAL HIGH (ref 0.0–100.0)

## 2023-08-20 MED ORDER — INSULIN GLARGINE-YFGN 100 UNIT/ML ~~LOC~~ SOLN
12.0000 [IU] | Freq: Once | SUBCUTANEOUS | Status: AC
  Administered 2023-08-20: 12 [IU] via SUBCUTANEOUS
  Filled 2023-08-20: qty 0.12

## 2023-08-20 MED ORDER — INSULIN ASPART 100 UNIT/ML IJ SOLN
15.0000 [IU] | Freq: Once | INTRAMUSCULAR | Status: AC
  Administered 2023-08-20: 15 [IU] via SUBCUTANEOUS

## 2023-08-20 MED ORDER — INSULIN ASPART 100 UNIT/ML IJ SOLN
10.0000 [IU] | Freq: Once | INTRAMUSCULAR | Status: AC
  Administered 2023-08-20: 10 [IU] via SUBCUTANEOUS

## 2023-08-20 NOTE — ED Provider Notes (Signed)
[place] EMERGENCY DEPARTMENT AT [facility] Provider Note   CSN: [id number] Arrival date & time: 08/19/23  2039     History  Chief Complaint  Patient presents with   Near Syncope    Hailey Klein is a 57 y.o. female.  The history is provided by the patient and medical records.  Near Syncope  Hailey Klein is a 57 y.o. female who presents to the Emergency Department complaining of syncope.  She presents the emergency department for evaluation of a syncopal event.  She was discharged from the hospital yesterday following an admission for community-acquired pneumonia.  After she was discharged home she woke up and went to get to the bathroom and she fell to the ground.  She did not strike her head.  She states that since hospital discharge she has been experiencing increased urination as well as dysuria.  She reports 1 week of left-sided flank pain.  She has not taken any medications since hospital discharge, as they are not available at home currently.  She also reports headache, but this has been off and on for the last week.  No fever.  She does feel cold at times.  She has a mild cough, but overall this is improving.  She also reports diarrhea with 3-4 bowel movements daily.     Home Medications Prior to Admission medications   Medication Sig Start Date End Date Taking? Authorizing Provider  albuterol (VENTOLIN HFA) 108 (90 Base) MCG/ACT inhaler Inhale 2 puffs into the lungs every 4 (four) hours as needed for wheezing or shortness of breath. 03/28/22   [provider]  amLODipine (NORVASC) 10 MG tablet Take 1 tablet (10 mg total) by mouth daily. 08/10/23 09/09/23  Karie Fetch, MD  ARIPiprazole (ABILIFY) 10 MG tablet Take 1 tablet (10 mg total) by mouth daily. 08/10/23 09/09/23  Karie Fetch, MD  aspirin EC 81 MG tablet Take 1 tablet (81 mg total) by mouth daily. Swallow whole. 09/03/23   Azucena Fallen, MD  atorvastatin (LIPITOR) 80 MG tablet Take  1 tablet (80 mg total) by mouth daily. 08/10/23 09/09/23  Karie Fetch, MD  BYDUREON BCISE 2 MG/0.85ML AUIJ Inject 2 mg into the skin every Tuesday. 05/13/22   [provider]  cyanocobalamin (VITAMIN B12) 500 MCG tablet Take 1 tablet (500 mcg total) by mouth daily. 08/03/23   Hongalgi, Maximino Greenland, MD  diphenhydramine-acetaminophen (TYLENOL PM) 25-500 MG TABS tablet Take 1 tablet by mouth at bedtime as needed (For sleep or pain).    [provider]  fluticasone (FLONASE) 50 MCG/ACT nasal spray Place 1 spray into both nostrils daily as needed for allergies or rhinitis. Patient not taking: Reported on 08/15/2023 05/13/22   [provider]  folic acid (FOLVITE) 1 MG tablet Take 1 tablet (1 mg total) by mouth daily. 08/03/23   Hongalgi, Maximino Greenland, MD  LANTUS SOLOSTAR 100 UNIT/ML Solostar Pen Inject 28 Units into the skin at bedtime. 05/13/22   [provider]  losartan (COZAAR) 100 MG tablet Take 1 tablet (100 mg total) by mouth daily. 08/10/23 09/09/23  Karie Fetch, MD  montelukast (SINGULAIR) 10 MG tablet Take 10 mg by mouth at bedtime. 05/13/22   [provider]  naphazoline-glycerin (CLEAR EYES REDNESS) 0.012-0.25 % SOLN Place 2 drops into both eyes 4 (four) times daily as needed (For allergies).    [provider]  nicotine (NICODERM CQ - DOSED IN MG/24 HOURS) 14 mg/24hr patch Place 1 patch (14 mg total) onto the  skin daily. 08/10/23 09/09/23  Karie Fetch, MD  pantoprazole (PROTONIX) 40 MG tablet Take 1 tablet (40 mg total) by mouth 2 (two) times daily. 08/19/23   Azucena Fallen, MD  potassium chloride (KLOR-CON M) 10 MEQ tablet Take 1 tablet (10 mEq total) by mouth daily. 08/10/23 09/09/23  Karie Fetch, MD  pregabalin (LYRICA) 100 MG capsule Take 1 capsule (100 mg total) by mouth 2 (two) times daily. 08/10/23 09/09/23  Karie Fetch, MD  SYMBICORT 160-4.5 MCG/ACT inhaler Inhale 2 puffs into the lungs 2 (two) times daily. 05/13/22   [provider]  thiamine (VITAMIN B1) 100 MG tablet Take 100 mg by mouth 2 (two) times daily. 05/13/22   [provider]  torsemide (DEMADEX) 20 MG tablet Take 1 tablet (20 mg total) by mouth daily. 08/19/23 09/18/23  Azucena Fallen, MD      Allergies    Aspirin    Review of Systems   Review of Systems  Cardiovascular:  Positive for near-syncope.  All other systems reviewed and are negative.   Physical Exam Updated Vital Signs BP 125/75   Pulse 79   Temp 97.8 F (36.6 C) (Oral)   Resp 19   Ht 5\' 3"  (1.6 m)   Wt (!) 154.1 kg   LMP 07/21/2015   SpO2 99%   BMI 60.18 kg/m  Physical Exam Vitals and nursing note reviewed.  Constitutional:      Appearance: She is well-developed.  HENT:     Head: Normocephalic and atraumatic.  Cardiovascular:     Rate and Rhythm: Normal rate and regular rhythm.     Heart sounds: No murmur heard. Pulmonary:     Effort: Pulmonary effort is normal. No respiratory distress.     Breath sounds: Normal breath sounds.  Abdominal:     Palpations: Abdomen is soft.     Tenderness: There is no guarding or rebound.     Comments: Mild generalized abdominal tenderness  Musculoskeletal:        General: No tenderness.     Comments: 2+ DP pulses bilaterally.  Pitting edema to bilateral lower extremities.  Skin:    General: Skin is warm and dry.  Neurological:     Mental Status: She is alert and oriented to person, place, and time.     Comments: 5 out of 5 strength in all 4 extremities  Psychiatric:        Behavior: Behavior normal.     ED Results / Procedures / Treatments   Labs (all labs ordered are listed, but only abnormal results are displayed) Labs Reviewed  LIPASE, BLOOD - Abnormal; Notable for the following components:      Result Value   Lipase 75 (*)    All other components within normal limits  COMPREHENSIVE METABOLIC PANEL - Abnormal; Notable for the following components:   Sodium 129 (*)    Chloride 96 (*)    CO2 20 (*)     Glucose, Bld 713 (*)    BUN 29 (*)    Creatinine, Ser 1.82 (*)    Calcium 8.2 (*)    Albumin 2.9 (*)    GFR, Estimated 32 (*)    All other components within normal limits  CBC - Abnormal; Notable for the following components:   RBC 3.38 (*)    Hemoglobin 9.5 (*)    HCT 30.9 (*)    Platelets 401 (*)    All other components within normal limits  URINALYSIS, ROUTINE W REFLEX MICROSCOPIC -  Abnormal; Notable for the following components:   Color, Urine STRAW (*)    Glucose, UA >=500 (*)    Hgb urine dipstick SMALL (*)    Protein, ur 100 (*)    All other components within normal limits  HEMOGLOBIN AND HEMATOCRIT, BLOOD - Abnormal; Notable for the following components:   Hemoglobin 9.3 (*)    HCT 29.3 (*)    All other components within normal limits  BRAIN NATRIURETIC PEPTIDE - Abnormal; Notable for the following components:   B Natriuretic Peptide 148.2 (*)    All other components within normal limits  BASIC METABOLIC PANEL - Abnormal; Notable for the following components:   Sodium 133 (*)    CO2 21 (*)    Glucose, Bld 551 (*)    BUN 29 (*)    Creatinine, Ser 1.62 (*)    Calcium 8.3 (*)    GFR, Estimated 37 (*)    All other components within normal limits  CBG MONITORING, ED - Abnormal; Notable for the following components:   Glucose-Capillary 510 (*)    All other components within normal limits  I-STAT VENOUS BLOOD GAS, ED - Abnormal; Notable for the following components:   pH, Ven 7.470 (*)    pCO2, Ven 30.0 (*)    pO2, Ven 63 (*)    Sodium 134 (*)    Calcium, Ion 0.99 (*)    HCT 27.0 (*)    Hemoglobin 9.2 (*)    All other components within normal limits  CBG MONITORING, ED - Abnormal; Notable for the following components:   Glucose-Capillary 504 (*)    All other components within normal limits  CBG MONITORING, ED - Abnormal; Notable for the following components:   Glucose-Capillary 461 (*)    All other components within normal limits    EKG EKG  Interpretation Date/Time:  Sunday August 19 2023 21:22:50 EDT Ventricular Rate:  94 PR Interval:  184 QRS Duration:  140 QT Interval:  406 QTC Calculation: 507 R Axis:   -74  Text Interpretation: Normal sinus rhythm Right bundle branch block Left anterior fascicular block Bifascicular block Abnormal ECG Confirmed by Tilden Fossa [phone]) on 08/20/2023 2:24:43 AM  Radiology CT ABDOMEN PELVIS WO CONTRAST  Result Date: 08/20/2023 CLINICAL DATA:  Blunt abdominal trauma. Near syncope at home. Abdominal pain EXAM: CT ABDOMEN AND PELVIS WITHOUT CONTRAST TECHNIQUE: Multidetector CT imaging of the abdomen and pelvis was performed following the standard protocol without IV contrast. RADIATION DOSE REDUCTION: This exam was performed according to the departmental dose-optimization program which includes automated exposure control, adjustment of the mA and/or kV according to patient size and/or use of iterative reconstruction technique. COMPARISON:  08/10/2022 FINDINGS: Lower chest:  No contributory findings. Hepatobiliary: No focal liver abnormality.No evidence of biliary obstruction or stone. Pancreas: Unremarkable. Spleen: Unremarkable. Adrenals/Urinary Tract: Negative adrenals. No hydronephrosis or stone. Unremarkable bladder. Stomach/Bowel:  No obstruction. No appendicitis. Vascular/Lymphatic: No acute vascular abnormality. Symmetric prominence of inguinal lymph nodes, chronic, stable, and likely congestive or reactive. There is a degree of generalized body wall edema. Atheromatous calcification, notable for age. Reproductive:No pathologic findings. Other: No ascites or pneumoperitoneum. Musculoskeletal: No acute abnormalities. Nodule in the midline is stable and benign, possibly a ganglion from the symphysis pubis. IMPRESSION: No acute or interval finding. Electronically Signed   By: Tiburcio Pea M.D.   On: 08/20/2023 05:17   CT Head Wo Contrast  Result Date: 08/20/2023 CLINICAL DATA:  Headache with  increased frequency or severity EXAM: CT HEAD WITHOUT  CONTRAST TECHNIQUE: Contiguous axial images were obtained from the base of the skull through the vertex without intravenous contrast. RADIATION DOSE REDUCTION: This exam was performed according to the departmental dose-optimization program which includes automated exposure control, adjustment of the mA and/or kV according to patient size and/or use of iterative reconstruction technique. COMPARISON:  None Available. FINDINGS: Brain: No evidence of acute infarction, hemorrhage, hydrocephalus, extra-axial collection or mass lesion/mass effect. Mild cerebral volume loss. Vascular: No hyperdense vessel or unexpected calcification. Skull: Normal. Negative for fracture or focal lesion. Sinuses/Orbits: No acute finding. IMPRESSION: Negative head CT. Electronically Signed   By: Tiburcio Pea M.D.   On: 08/20/2023 04:03    Procedures Procedures    Medications Ordered in ED Medications  insulin aspart (novoLOG) injection 15 Units (15 Units Subcutaneous Given 08/20/23 0435)  insulin glargine-yfgn (SEMGLEE) injection 12 Units (12 Units Subcutaneous Given 08/20/23 0636)  insulin aspart (novoLOG) injection 10 Units (10 Units Subcutaneous Given 08/20/23 0636)    ED Course/ Medical Decision Making/ A&P                                 Medical Decision Making Amount and/or Complexity of Data Reviewed Labs: ordered. Radiology: ordered.  Risk Prescription drug management.   Patient with history of diabetes, CKD, hypertension here for evaluation of near syncopal event with a fall just after being discharged from the hospital for community-acquired pneumonia.  She is found to be hyperglycemic but she is not taking her insulin.  Blood sugars in the 700s.  No evidence of DKA.  Overall she appears euvolemic to slightly volume up on examination.  Initial lab with elevation in creatinine compared to baseline, but her creatinine did decrease on recheck without  intervention.  CT head is negative for acute abnormality. Blood sugars are improving after insulin administration.  Patient states she should have insulin when she gets home.  Current clinical picture is not consistent with acute CHF, PE, sepsis.  Discussed continuing her insulin when she gets home with outpatient follow-up and return precautions.        Final Clinical Impression(s) / ED Diagnoses Final diagnoses:  Hyperglycemia  Near syncope  Contusion of abdominal wall, initial encounter    Rx / DC Orders ED Discharge Orders     None         Tilden Fossa, MD 08/20/23 [phone]

## 2023-08-20 NOTE — ED Provider Triage Note (Signed)
Emergency Medicine Provider Triage Evaluation Note  Nicolas Sisler , a 57 y.o. female  was evaluated in triage.  Pt complains of pain all over her body.  Patient was seen multiple times in the last 48 hours for body aches.  Notably she has been extremely hyperglycemic.  She had a CT scan of her abdomen yesterday.  All of her workup was negative.  Patient complaining of bodyaches, polyuria and pain across her left upper abdominal quadrant.  CT angiogram of the chest was negative along with CT abdomen pelvis within the last 48 hours.  Review of Systems  Positive: Body aches Negative: fever  Physical Exam  BP (!) 182/84 (BP Location: Left Arm)   Pulse 89   Temp 98.5 F (36.9 C) (Oral)   Resp 17   LMP 07/21/2015   SpO2 97%  Gen:   Awake, no distress   Resp:  Normal effort  MSK:   Moves extremities without difficulty  Other:    Medical Decision Making  Medically screening exam initiated at 11:03 PM.  Appropriate orders placed.  Krystal Teachey was informed that the remainder of the evaluation will be completed by another provider, this initial triage assessment does not replace that evaluation, and the importance of remaining in the ED until their evaluation is complete.     Arthor Captain, PA-C 08/20/23 2306

## 2023-08-20 NOTE — ED Notes (Signed)
Pt is ambulatory at her baseline(uses a walker at home)

## 2023-08-20 NOTE — ED Triage Notes (Signed)
Pt arrived from home via POV c/o general body aches 10/10. Pt states that she also feels more swollen than normal .

## 2023-08-21 ENCOUNTER — Encounter (HOSPITAL_COMMUNITY): Payer: Self-pay | Admitting: Gastroenterology

## 2023-08-21 LAB — URINALYSIS, ROUTINE W REFLEX MICROSCOPIC
Bilirubin Urine: NEGATIVE
Glucose, UA: >=500 mg/dL — AB
Ketones, ur: NEGATIVE mg/dL
Leukocytes,Ua: NEGATIVE
Nitrite: NEGATIVE
Protein, ur: >=300 mg/dL — AB
Specific Gravity, Urine: 1.021 (ref 1.005–1.030)
pH: 6 (ref 5.0–8.0)

## 2023-08-21 LAB — CBC
HCT: 28.2 % — ABNORMAL LOW (ref 36.0–46.0)
Hemoglobin: 9.2 g/dL — ABNORMAL LOW (ref 12.0–15.0)
MCH: 29.5 pg (ref 26.0–34.0)
MCHC: 32.6 g/dL (ref 30.0–36.0)
MCV: 90.4 fL (ref 80.0–100.0)
Platelets: 389 10*3/uL (ref 150–400)
RBC: 3.12 MIL/uL — ABNORMAL LOW (ref 3.87–5.11)
RDW: 12.9 % (ref 11.5–15.5)
WBC: 7.9 10*3/uL (ref 4.0–10.5)
nRBC: 0 % (ref 0.0–0.2)

## 2023-08-21 LAB — COMPREHENSIVE METABOLIC PANEL
ALT: 27 U/L (ref 0–44)
AST: 16 U/L (ref 15–41)
Albumin: 3.3 g/dL — ABNORMAL LOW (ref 3.5–5.0)
Alkaline Phosphatase: 114 U/L (ref 38–126)
Anion gap: 11 (ref 5–15)
BUN: 30 mg/dL — ABNORMAL HIGH (ref 6–20)
CO2: 21 mmol/L — ABNORMAL LOW (ref 22–32)
Calcium: 8.3 mg/dL — ABNORMAL LOW (ref 8.9–10.3)
Chloride: 101 mmol/L (ref 98–111)
Creatinine, Ser: 1.48 mg/dL — ABNORMAL HIGH (ref 0.44–1.00)
GFR, Estimated: 42 mL/min — ABNORMAL LOW (ref >60)
Glucose, Bld: 397 mg/dL — ABNORMAL HIGH (ref 70–99)
Potassium: 3.2 mmol/L — ABNORMAL LOW (ref 3.5–5.1)
Sodium: 133 mmol/L — ABNORMAL LOW (ref 135–145)
Total Bilirubin: 0.3 mg/dL (ref 0.3–1.2)
Total Protein: 7.7 g/dL (ref 6.5–8.1)

## 2023-08-21 LAB — LIPASE, BLOOD: Lipase: 64 U/L — ABNORMAL HIGH (ref 11–51)

## 2023-08-21 LAB — CBG MONITORING, ED: Glucose-Capillary: 384 mg/dL — ABNORMAL HIGH (ref 70–99)

## 2023-08-21 LAB — LACTIC ACID, PLASMA: Lactic Acid, Venous: 1.4 mmol/L (ref 0.5–1.9)

## 2023-08-21 LAB — SARS CORONAVIRUS 2 BY RT PCR: SARS Coronavirus 2 by RT PCR: NEGATIVE

## 2023-08-21 LAB — BRAIN NATRIURETIC PEPTIDE: B Natriuretic Peptide: 51.5 pg/mL (ref 0.0–100.0)

## 2023-08-21 MED ORDER — PROCHLORPERAZINE EDISYLATE 10 MG/2ML IJ SOLN
10.0000 mg | Freq: Once | INTRAMUSCULAR | Status: AC
  Administered 2023-08-21: 10 mg via INTRAVENOUS
  Filled 2023-08-21: qty 2

## 2023-08-21 MED ORDER — INSULIN ASPART 100 UNIT/ML IJ SOLN
10.0000 [IU] | Freq: Once | INTRAMUSCULAR | Status: AC
  Administered 2023-08-21: 10 [IU] via INTRAVENOUS
  Filled 2023-08-21: qty 0.1

## 2023-08-21 MED ORDER — MAGNESIUM OXIDE -MG SUPPLEMENT 400 (240 MG) MG PO TABS
800.0000 mg | ORAL_TABLET | Freq: Once | ORAL | Status: AC
  Administered 2023-08-21: 800 mg via ORAL
  Filled 2023-08-21: qty 2

## 2023-08-21 MED ORDER — HYDRALAZINE HCL 20 MG/ML IJ SOLN
5.0000 mg | Freq: Once | INTRAMUSCULAR | Status: AC
  Administered 2023-08-21: 5 mg via INTRAVENOUS
  Filled 2023-08-21: qty 1

## 2023-08-21 MED ORDER — DIPHENHYDRAMINE HCL 50 MG/ML IJ SOLN
25.0000 mg | Freq: Once | INTRAMUSCULAR | Status: AC
  Administered 2023-08-21: 25 mg via INTRAVENOUS
  Filled 2023-08-21: qty 1

## 2023-08-21 MED ORDER — POTASSIUM CHLORIDE CRYS ER 20 MEQ PO TBCR
40.0000 meq | EXTENDED_RELEASE_TABLET | Freq: Once | ORAL | Status: AC
  Administered 2023-08-21: 40 meq via ORAL
  Filled 2023-08-21: qty 2

## 2023-08-21 MED ORDER — KETOROLAC TROMETHAMINE 15 MG/ML IJ SOLN
15.0000 mg | Freq: Once | INTRAMUSCULAR | Status: AC
  Administered 2023-08-21: 15 mg via INTRAVENOUS
  Filled 2023-08-21: qty 1

## 2023-08-22 ENCOUNTER — Emergency Department (HOSPITAL_COMMUNITY)
Admission: EM | Admit: 2023-08-22 | Discharge: 2023-08-24 | Disposition: A | Discharge status: Other Acute Inpatient Hospital | Payer: Medicaid Other | Source: Home / Self Care | Attending: Emergency Medicine | Admitting: Emergency Medicine

## 2023-08-22 ENCOUNTER — Emergency Department (HOSPITAL_COMMUNITY): Payer: Medicaid Other

## 2023-08-22 ENCOUNTER — Encounter (HOSPITAL_COMMUNITY): Payer: Self-pay | Admitting: Emergency Medicine

## 2023-08-22 ENCOUNTER — Encounter (HOSPITAL_COMMUNITY): Payer: Self-pay

## 2023-08-22 ENCOUNTER — Other Ambulatory Visit: Payer: Self-pay

## 2023-08-22 ENCOUNTER — Emergency Department (HOSPITAL_COMMUNITY)
Admission: EM | Admit: 2023-08-22 | Discharge: 2023-08-22 | Disposition: A | Discharge status: Home / Self Care | Payer: Medicaid Other | Attending: Emergency Medicine | Admitting: Emergency Medicine

## 2023-08-22 DIAGNOSIS — J3489 Other specified disorders of nose and nasal sinuses: Secondary | ICD-10-CM | POA: Diagnosis not present

## 2023-08-22 DIAGNOSIS — I2510 Atherosclerotic heart disease of native coronary artery without angina pectoris: Secondary | ICD-10-CM | POA: Diagnosis present

## 2023-08-22 DIAGNOSIS — F411 Generalized anxiety disorder: Secondary | ICD-10-CM | POA: Insufficient documentation

## 2023-08-22 DIAGNOSIS — F17210 Nicotine dependence, cigarettes, uncomplicated: Secondary | ICD-10-CM | POA: Insufficient documentation

## 2023-08-22 DIAGNOSIS — M25562 Pain in left knee: Secondary | ICD-10-CM | POA: Insufficient documentation

## 2023-08-22 DIAGNOSIS — F1220 Cannabis dependence, uncomplicated: Secondary | ICD-10-CM | POA: Diagnosis not present

## 2023-08-22 DIAGNOSIS — Z7982 Long term (current) use of aspirin: Secondary | ICD-10-CM | POA: Diagnosis not present

## 2023-08-22 DIAGNOSIS — F1420 Cocaine dependence, uncomplicated: Secondary | ICD-10-CM | POA: Diagnosis present

## 2023-08-22 DIAGNOSIS — Z79899 Other long term (current) drug therapy: Secondary | ICD-10-CM | POA: Diagnosis not present

## 2023-08-22 DIAGNOSIS — R0609 Other forms of dyspnea: Secondary | ICD-10-CM

## 2023-08-22 DIAGNOSIS — F333 Major depressive disorder, recurrent, severe with psychotic symptoms: Secondary | ICD-10-CM | POA: Insufficient documentation

## 2023-08-22 DIAGNOSIS — I509 Heart failure, unspecified: Secondary | ICD-10-CM | POA: Insufficient documentation

## 2023-08-22 DIAGNOSIS — R600 Localized edema: Secondary | ICD-10-CM | POA: Insufficient documentation

## 2023-08-22 DIAGNOSIS — R7989 Other specified abnormal findings of blood chemistry: Secondary | ICD-10-CM | POA: Diagnosis not present

## 2023-08-22 DIAGNOSIS — E1142 Type 2 diabetes mellitus with diabetic polyneuropathy: Secondary | ICD-10-CM | POA: Diagnosis present

## 2023-08-22 DIAGNOSIS — R5383 Other fatigue: Secondary | ICD-10-CM | POA: Diagnosis not present

## 2023-08-22 DIAGNOSIS — I10 Essential (primary) hypertension: Secondary | ICD-10-CM | POA: Diagnosis present

## 2023-08-22 DIAGNOSIS — F1490 Cocaine use, unspecified, uncomplicated: Secondary | ICD-10-CM | POA: Insufficient documentation

## 2023-08-22 DIAGNOSIS — M25561 Pain in right knee: Secondary | ICD-10-CM | POA: Insufficient documentation

## 2023-08-22 DIAGNOSIS — F1121 Opioid dependence, in remission: Secondary | ICD-10-CM | POA: Diagnosis present

## 2023-08-22 DIAGNOSIS — I130 Hypertensive heart and chronic kidney disease with heart failure and stage 1 through stage 4 chronic kidney disease, or unspecified chronic kidney disease: Secondary | ICD-10-CM | POA: Insufficient documentation

## 2023-08-22 DIAGNOSIS — Z1152 Encounter for screening for COVID-19: Secondary | ICD-10-CM | POA: Insufficient documentation

## 2023-08-22 DIAGNOSIS — E785 Hyperlipidemia, unspecified: Secondary | ICD-10-CM

## 2023-08-22 DIAGNOSIS — N189 Chronic kidney disease, unspecified: Secondary | ICD-10-CM | POA: Insufficient documentation

## 2023-08-22 DIAGNOSIS — K922 Gastrointestinal hemorrhage, unspecified: Secondary | ICD-10-CM | POA: Insufficient documentation

## 2023-08-22 DIAGNOSIS — F1410 Cocaine abuse, uncomplicated: Secondary | ICD-10-CM | POA: Diagnosis present

## 2023-08-22 DIAGNOSIS — F1290 Cannabis use, unspecified, uncomplicated: Secondary | ICD-10-CM | POA: Insufficient documentation

## 2023-08-22 DIAGNOSIS — W19XXXA Unspecified fall, initial encounter: Secondary | ICD-10-CM

## 2023-08-22 DIAGNOSIS — F4310 Post-traumatic stress disorder, unspecified: Secondary | ICD-10-CM | POA: Insufficient documentation

## 2023-08-22 DIAGNOSIS — E66813 Obesity, class 3: Secondary | ICD-10-CM | POA: Diagnosis not present

## 2023-08-22 DIAGNOSIS — E1169 Type 2 diabetes mellitus with other specified complication: Secondary | ICD-10-CM | POA: Diagnosis present

## 2023-08-22 DIAGNOSIS — D649 Anemia, unspecified: Secondary | ICD-10-CM | POA: Diagnosis present

## 2023-08-22 DIAGNOSIS — E1122 Type 2 diabetes mellitus with diabetic chronic kidney disease: Secondary | ICD-10-CM | POA: Insufficient documentation

## 2023-08-22 DIAGNOSIS — I129 Hypertensive chronic kidney disease with stage 1 through stage 4 chronic kidney disease, or unspecified chronic kidney disease: Secondary | ICD-10-CM | POA: Insufficient documentation

## 2023-08-22 DIAGNOSIS — F339 Major depressive disorder, recurrent, unspecified: Secondary | ICD-10-CM | POA: Diagnosis not present

## 2023-08-22 DIAGNOSIS — R0602 Shortness of breath: Secondary | ICD-10-CM

## 2023-08-22 LAB — CBC
HCT: 27.7 % — ABNORMAL LOW (ref 36.0–46.0)
HCT: 27.6 % — ABNORMAL LOW (ref 36.0–46.0)
Hemoglobin: 8.7 g/dL — ABNORMAL LOW (ref 12.0–15.0)
Hemoglobin: 9 g/dL — ABNORMAL LOW (ref 12.0–15.0)
MCH: 28.8 pg (ref 26.0–34.0)
MCH: 29.3 pg (ref 26.0–34.0)
MCHC: 31.4 g/dL (ref 30.0–36.0)
MCHC: 32.6 g/dL (ref 30.0–36.0)
MCV: 91.7 fL (ref 80.0–100.0)
MCV: 89.9 fL (ref 80.0–100.0)
Platelets: 374 10*3/uL (ref 150–400)
Platelets: 347 10*3/uL (ref 150–400)
RBC: 3.02 MIL/uL — ABNORMAL LOW (ref 3.87–5.11)
RBC: 3.07 MIL/uL — ABNORMAL LOW (ref 3.87–5.11)
RDW: 12.8 % (ref 11.5–15.5)
RDW: 12.9 % (ref 11.5–15.5)
WBC: 7.9 10*3/uL (ref 4.0–10.5)
WBC: 7.4 10*3/uL (ref 4.0–10.5)
nRBC: 0 % (ref 0.0–0.2)
nRBC: 0 % (ref 0.0–0.2)

## 2023-08-22 LAB — URINALYSIS, ROUTINE W REFLEX MICROSCOPIC
Bilirubin Urine: NEGATIVE
Glucose, UA: >=500 mg/dL — AB
Ketones, ur: NEGATIVE mg/dL
Leukocytes,Ua: NEGATIVE
Nitrite: NEGATIVE
Protein, ur: >=300 mg/dL — AB
Specific Gravity, Urine: 1.017 (ref 1.005–1.030)
pH: 5 (ref 5.0–8.0)

## 2023-08-22 LAB — I-STAT VENOUS BLOOD GAS, ED
Acid-base deficit: 1 mmol/L (ref 0.0–2.0)
Bicarbonate: 23.2 mmol/L (ref 20.0–28.0)
Calcium, Ion: 1.1 mmol/L — ABNORMAL LOW (ref 1.15–1.40)
HCT: 27 % — ABNORMAL LOW (ref 36.0–46.0)
Hemoglobin: 9.2 g/dL — ABNORMAL LOW (ref 12.0–15.0)
O2 Saturation: 99 %
Potassium: 3.7 mmol/L (ref 3.5–5.1)
Sodium: 137 mmol/L (ref 135–145)
TCO2: 24 mmol/L (ref 22–32)
pCO2, Ven: 34.3 mm[Hg] — ABNORMAL LOW (ref 44–60)
pH, Ven: 7.438 — ABNORMAL HIGH (ref 7.25–7.43)
pO2, Ven: 114 mm[Hg] — ABNORMAL HIGH (ref 32–45)

## 2023-08-22 LAB — BASIC METABOLIC PANEL
Anion gap: 11 (ref 5–15)
BUN: 18 mg/dL (ref 6–20)
CO2: 21 mmol/L — ABNORMAL LOW (ref 22–32)
Calcium: 8.3 mg/dL — ABNORMAL LOW (ref 8.9–10.3)
Chloride: 106 mmol/L (ref 98–111)
Creatinine, Ser: 1.22 mg/dL — ABNORMAL HIGH (ref 0.44–1.00)
GFR, Estimated: 52 mL/min — ABNORMAL LOW (ref >60)
Glucose, Bld: 336 mg/dL — ABNORMAL HIGH (ref 70–99)
Potassium: 3.6 mmol/L (ref 3.5–5.1)
Sodium: 138 mmol/L (ref 135–145)

## 2023-08-22 LAB — CBG MONITORING, ED: Glucose-Capillary: 314 mg/dL — ABNORMAL HIGH (ref 70–99)

## 2023-08-22 LAB — TSH: TSH: 5.443 u[IU]/mL — ABNORMAL HIGH (ref 0.350–4.500)

## 2023-08-22 LAB — TROPONIN I (HIGH SENSITIVITY)
Troponin I (High Sensitivity): 21 ng/L — ABNORMAL HIGH (ref <18)
Troponin I (High Sensitivity): 19 ng/L — ABNORMAL HIGH (ref <18)

## 2023-08-22 LAB — POC OCCULT BLOOD, ED: Fecal Occult Bld: NEGATIVE

## 2023-08-22 LAB — BRAIN NATRIURETIC PEPTIDE: B Natriuretic Peptide: 107.7 pg/mL — ABNORMAL HIGH (ref 0.0–100.0)

## 2023-08-22 MED ORDER — ACETAMINOPHEN 500 MG PO TABS
1000.0000 mg | ORAL_TABLET | Freq: Once | ORAL | Status: AC
  Administered 2023-08-22: 1000 mg via ORAL
  Filled 2023-08-22: qty 2

## 2023-08-22 NOTE — ED Triage Notes (Signed)
Pt states she was going to bus stop after being discharged here today and fell onto knees. States they are swollen but difficult to tell due to body habitus. Denies hitting head or LOC.

## 2023-08-22 NOTE — ED Triage Notes (Signed)
Pt BIB EMS due to weakness that was seen yesterday for same. Pt states it has gotten worse overnight. Pt states she is swollen, hx of CHF and HTN. BS 365. Pt states she took torsemide this morning and has been urinating

## 2023-08-22 NOTE — ED Provider Notes (Signed)
  Physical Exam  BP (!) 153/85   Pulse 79   Temp 98.4 F (36.9 C) (Oral)   Resp (!) 21   Ht 5\' 3"  (1.6 m)   Wt 136.1 kg   LMP 07/21/2015   SpO2 99%   BMI 53.14 kg/m   Physical Exam  Procedures  Procedures  ED Course / MDM    Medical Decision Making Amount and/or Complexity of Data Reviewed Labs: ordered. Radiology: ordered.  Risk OTC drugs. Decision regarding hospitalization.   I, Rosana Berger, assumed care for this patient.  Brief this a 57 year old female came to emergency department today for continued weakness, leg swelling.  Patient was signed out to me pending repeat CBC, as well as consultation with the hospitalist.  Hospitalist came and evaluated the patient.  They do not believe patient admission criteria unless CBC showed decrease in hemoglobin, or patient desaturated on ambulation.  Repeat CBC showed a hemoglobin of 9, then 9.2.  Patient ambulated in the emergency department and O2 sat did not drop below 100%.  Discharge patient, she has been set up with follow-up with her primary care doctor.       Anders Simmonds T, DO 08/22/23 Julian Reil

## 2023-08-22 NOTE — ED Notes (Signed)
Pt brought to room, she is changing into gown

## 2023-08-22 NOTE — Discharge Instructions (Addendum)
Please take 2 of your torsemide pills for the next 2 days.  Call your primary care doctor tomorrow to set up a follow-up appointment.  Come back to the emergency department if you develop worsening shortness of breath or weakness.

## 2023-08-22 NOTE — ED Provider Notes (Signed)
[place] EMERGENCY DEPARTMENT AT [facility] Provider Note   CSN: [id number] Arrival date & time: 08/22/23  1031     History  Chief Complaint  Patient presents with   Weakness    Hailey Klein is a 57 y.o. female.   Weakness Severity:  Moderate Onset quality:  Gradual Duration:  3 days Timing:  Constant Progression:  Worsening Chronicity:  Recurrent Relieved by:  Nothing Exacerbated by: exertion. Associated symptoms: melena and shortness of breath   Associated symptoms: no abdominal pain, no chest pain, no cough, no diarrhea, no dizziness, no dysuria, no numbness in extremities, no fever, no foul-smelling urine, no headaches, no nausea, no near-syncope, no seizures and no vomiting        Home Medications Prior to Admission medications   Medication Sig Start Date End Date Taking? Authorizing Provider  albuterol (VENTOLIN HFA) 108 (90 Base) MCG/ACT inhaler Inhale 2 puffs into the lungs every 4 (four) hours as needed for wheezing or shortness of breath. 03/28/22   [provider]  amLODipine (NORVASC) 10 MG tablet Take 1 tablet (10 mg total) by mouth daily. 08/10/23 09/09/23  Karie Fetch, MD  ARIPiprazole (ABILIFY) 10 MG tablet Take 1 tablet (10 mg total) by mouth daily. 08/10/23 09/09/23  Karie Fetch, MD  aspirin EC 81 MG tablet Take 1 tablet (81 mg total) by mouth daily. Swallow whole. 09/03/23   Azucena Fallen, MD  atorvastatin (LIPITOR) 80 MG tablet Take 1 tablet (80 mg total) by mouth daily. 08/10/23 09/09/23  Karie Fetch, MD  BYDUREON BCISE 2 MG/0.85ML AUIJ Inject 2 mg into the skin every Tuesday. 05/13/22   [provider]  cyanocobalamin (VITAMIN B12) 500 MCG tablet Take 1 tablet (500 mcg total) by mouth daily. 08/03/23   Hongalgi, Maximino Greenland, MD  diphenhydramine-acetaminophen (TYLENOL PM) 25-500 MG TABS tablet Take 1 tablet by mouth at bedtime as needed (For sleep or pain).    [provider]  fluticasone (FLONASE)  50 MCG/ACT nasal spray Place 1 spray into both nostrils daily as needed for allergies or rhinitis. Patient not taking: Reported on 08/15/2023 05/13/22   [provider]  folic acid (FOLVITE) 1 MG tablet Take 1 tablet (1 mg total) by mouth daily. 08/03/23   Hongalgi, Maximino Greenland, MD  LANTUS SOLOSTAR 100 UNIT/ML Solostar Pen Inject 28 Units into the skin at bedtime. 05/13/22   [provider]  losartan (COZAAR) 100 MG tablet Take 1 tablet (100 mg total) by mouth daily. 08/10/23 09/09/23  Karie Fetch, MD  montelukast (SINGULAIR) 10 MG tablet Take 10 mg by mouth at bedtime. 05/13/22   [provider]  naphazoline-glycerin (CLEAR EYES REDNESS) 0.012-0.25 % SOLN Place 2 drops into both eyes 4 (four) times daily as needed (For allergies).    [provider]  nicotine (NICODERM CQ - DOSED IN MG/24 HOURS) 14 mg/24hr patch Place 1 patch (14 mg total) onto the skin daily. 08/10/23 09/09/23  Karie Fetch, MD  pantoprazole (PROTONIX) 40 MG tablet Take 1 tablet (40 mg total) by mouth 2 (two) times daily. 08/19/23   Azucena Fallen, MD  potassium chloride (KLOR-CON M) 10 MEQ tablet Take 1 tablet (10 mEq total) by mouth daily. 08/10/23 09/09/23  Karie Fetch, MD  pregabalin (LYRICA) 100 MG capsule Take 1 capsule (100 mg total) by mouth 2 (two) times daily. 08/10/23 09/09/23  Karie Fetch, MD  SYMBICORT 160-4.5 MCG/ACT inhaler Inhale 2 puffs into the lungs 2 (two) times daily. 05/13/22   [provider]  thiamine (VITAMIN B1) 100 MG tablet Take 100 mg by mouth 2 (two) times daily. 05/13/22   [provider]  torsemide (DEMADEX) 20 MG tablet Take 1 tablet (20 mg total) by mouth daily. 08/19/23 09/18/23  Azucena Fallen, MD      Allergies    Aspirin    Review of Systems   Review of Systems  Constitutional:  Positive for fatigue. Negative for chills and fever.  HENT:  Negative for congestion.   Respiratory:  Positive for shortness of breath. Negative for  cough, chest tightness and wheezing.   Cardiovascular:  Negative for chest pain, palpitations and near-syncope.  Gastrointestinal:  Positive for blood in stool and melena. Negative for abdominal pain, constipation, diarrhea, nausea and vomiting.  Genitourinary:  Negative for dysuria and flank pain.  Musculoskeletal:  Negative for back pain, neck pain and neck stiffness.  Skin:  Negative for rash and wound.  Neurological:  Positive for weakness. Negative for dizziness, seizures, light-headedness and headaches.  Psychiatric/Behavioral:  Negative for agitation and confusion.   All other systems reviewed and are negative.   Physical Exam Updated Vital Signs BP 139/77   Pulse 85   Temp 98.4 F (36.9 C)   Resp 18   Ht 5\' 3"  (1.6 m)   Wt 136.1 kg   LMP 07/21/2015   SpO2 100%   BMI 53.14 kg/m  Physical Exam Vitals and nursing note reviewed.  Constitutional:      General: She is not in acute distress.    Appearance: She is well-developed. She is not ill-appearing, toxic-appearing or diaphoretic.  HENT:     Head: Normocephalic and atraumatic.     Nose: No congestion or rhinorrhea.     Mouth/Throat:     Pharynx: No oropharyngeal exudate or posterior oropharyngeal erythema.  Eyes:     Extraocular Movements: Extraocular movements intact.     Conjunctiva/sclera: Conjunctivae normal.     Pupils: Pupils are equal, round, and reactive to light.  Cardiovascular:     Rate and Rhythm: Normal rate and regular rhythm.     Pulses: Normal pulses.     Heart sounds: No murmur heard. Pulmonary:     Effort: Pulmonary effort is normal. No respiratory distress.     Breath sounds: Rhonchi and rales present.  Chest:     Chest wall: No tenderness.  Abdominal:     Palpations: Abdomen is soft.     Tenderness: There is no abdominal tenderness. There is no guarding or rebound.  Musculoskeletal:        General: No swelling or tenderness.     Cervical back: Neck supple.     Right lower leg: Edema  present.     Left lower leg: Edema present.  Skin:    General: Skin is warm and dry.     Capillary Refill: Capillary refill takes less than 2 seconds.     Findings: No erythema or rash.  Neurological:     General: No focal deficit present.     Mental Status: She is alert.     Sensory: No sensory deficit.     Motor: No weakness.  Psychiatric:        Mood and Affect: Mood normal.     ED Results / Procedures / Treatments   Labs (all labs ordered are listed, but only abnormal results are displayed) Labs Reviewed  BASIC METABOLIC PANEL - Abnormal; Notable for the following components:      Result Value   CO2  21 (*)    Glucose, Bld 336 (*)    Creatinine, Ser 1.22 (*)    Calcium 8.3 (*)    GFR, Estimated 52 (*)    All other components within normal limits  CBC - Abnormal; Notable for the following components:   RBC 3.02 (*)    Hemoglobin 8.7 (*)    HCT 27.7 (*)    All other components within normal limits  URINALYSIS, ROUTINE W REFLEX MICROSCOPIC - Abnormal; Notable for the following components:   APPearance HAZY (*)    Glucose, UA >=500 (*)    Hgb urine dipstick MODERATE (*)    Protein, ur >=300 (*)    Bacteria, UA RARE (*)    All other components within normal limits  BRAIN NATRIURETIC PEPTIDE - Abnormal; Notable for the following components:   B Natriuretic Peptide 107.7 (*)    All other components within normal limits  TSH - Abnormal; Notable for the following components:   TSH 5.443 (*)    All other components within normal limits  CBG MONITORING, ED - Abnormal; Notable for the following components:   Glucose-Capillary 314 (*)    All other components within normal limits  TROPONIN I (HIGH SENSITIVITY) - Abnormal; Notable for the following components:   Troponin I (High Sensitivity) 21 (*)    All other components within normal limits  CBC  POC OCCULT BLOOD, ED  I-STAT VENOUS BLOOD GAS, ED  TROPONIN I (HIGH SENSITIVITY)    EKG EKG  Interpretation Date/Time:  Wednesday August 22 2023 10:18:01 EDT Ventricular Rate:  87 PR Interval:  150 QRS Duration:  134 QT Interval:  420 QTC Calculation: 505 R Axis:   -85  Text Interpretation: Normal sinus rhythm Right bundle branch block Left anterior fascicular block Bifascicular block Abnormal ECG When compared with ECG of 19-Aug-2023 21:22, PREVIOUS ECG IS PRESENT when compared to prior, overall similar appearnce. No STEMI Confirmed by Theda Belfast ([id number]) on 08/22/2023 11:49:33 AM  Radiology DG Chest 2 View  Result Date: 08/22/2023 CLINICAL DATA:  Exertional shortness of breath EXAM: CHEST - 2 VIEW COMPARISON:  X-ray and CT angiogram 08/15/2023 FINDINGS: Interval slight decrease in patchy bilateral lung opacities with significant residual along the lung bases. Stable enlarged cardiopericardial silhouette with prominent central vasculature. No pneumothorax or effusion. Frontal view is kyphotic limiting evaluation of the apices. Mild degenerative changes of the spine. IMPRESSION: Decreasing lung opacities with some residual. Recommend continued follow-up. Electronically Signed   By: Karen Kays M.D.   On: 08/22/2023 13:38    Procedures Procedures    Medications Ordered in ED Medications - No data to display  ED Course/ Medical Decision Making/ A&P                                 Medical Decision Making Amount and/or Complexity of Data Reviewed Labs: ordered. Radiology: ordered.  Risk Decision regarding hospitalization.    Hailey Klein is a 57 y.o. female with a past medical history significant for CAD with previous NSTEMI, hypertension, diabetes, CKD, hypercholesterolemia, CHF, previous substance abuse, and recent admission for hypoxic respiratory failure in the setting of GI bleed who presents with worsening peripheral edema, exertional shortness of breath, dark stools, and fatigue.  According to patient, she was discharged in the hospital several days ago and  this is the third visit since being discharged several days ago.  She reports she is having worsened fatigue and more  peripheral edema despite taking her diuretics.  She reports her stools have turned dark again and she is concerned about continued GI bleeding.  She reportedly had an endoscopy that showed an ulcer that was not bleeding significantly.  She reports no chest pain but does have exertional shortness of breath that has been worsening.  She denies any fevers could chills, or cough.  Denies any abdominal pain at this time.  Denies any nausea or vomiting.  Reports her swelling and edema has worsened and she feels very drained and tired.  She reports that she feels like "her body shutting down".  She is worried about going home.  On exam, lungs did have some rales and rhonchi but no wheezing.  Chest and abdomen were nontender on my exam.  Back not focally tender.  She does have peripheral edema.  Patient was tachypneic but was not tachycardic.  Oxygen saturations were in the 90s on room air.  EKG does not show STEMI.  Given patient's peripheral edema, BNP was ordered and was found to be more elevated than yesterday.  Kidney function has improved and her x-ray did not show evidence of new pneumonia, showed similar opacities that were slightly improved.    Troponin is elevated but improved from prior.  Otherwise her white count was normal and her electrolytes were overall similar to prior.  Low calcium similar to prior.  TSH similar.  Given her recent GI bleed and known ulceration, did not feel that fecal occult would be additive but I am concerned about continued GI bleeding.  Her hemoglobin has down trended from the nines yesterday down to 8.7 and it appears on her chart that it dropped to 8.7 before dropped into the sixes during her recent admission.  Patient is concerned about going home.  Given the continued subtle GI bleed with downtrending hemoglobin, worsening peripheral edema with  correlating elevation of BNP, and her exertional shortness of breath and fatigue, I am concerned patient may need admission to watch her hemoglobins, carefully diurese patient, and given her exertional shortness of breath may end up needing a repeat echo.  Will call medicine for readmission given these concerns.  4:15 PM Fecal occult test was surprisingly negative despite my concern for some bleeding.  I spoke to medicine who will come see the patient to determine if she needs to be admitted or is safe to go home.  They did agree with a second CBC to make sure it is not significantly downtrending to help determine disposition.  Patient is in agreement with this plan.  Caretransferred to oncoming team to await next CBC and then discussion with hospitalist about admission versus discharge home.        Final Clinical Impression(s) / ED Diagnoses Final diagnoses:  Exertional shortness of breath  Gastrointestinal hemorrhage, unspecified gastrointestinal hemorrhage type  Fatigue, unspecified type  Elevated brain natriuretic peptide (BNP) level      Clinical Impression: 1. Exertional shortness of breath   2. Gastrointestinal hemorrhage, unspecified gastrointestinal hemorrhage type   3. Fatigue, unspecified type   4. Elevated brain natriuretic peptide (BNP) level     Disposition: transferred to oncoming team to await next CBC and then discussion with hospitalist about admission versus discharge home.  This note was prepared with assistance of Conservation officer, historic buildings. Occasional wrong-word or sound-a-like substitutions may have occurred due to the inherent limitations of voice recognition software.      Detrich Rakestraw, Canary Brim, MD 08/22/23 [phone]

## 2023-08-22 NOTE — ED Notes (Signed)
Pt ambulated with steady gait Spo2 at 100% RA, Pulse 80 before ambulating, after ambulating pt Spo2 remained 100% RA, with pulse at 95

## 2023-08-22 NOTE — Consult Note (Addendum)
Consult Note  Hailey Klein WUJ:[id number] DOB: [birth year] DOA: 08/22/2023  PCP: Pcp, No   Patient coming from: Home  Chief Complaint: Weakness  HPI: Hailey Klein is a 57 y.o. female with medical history significant of hypertension, diabetes, neuropathy, obesity, CAD, gout, CHF, DVT, cocaine use, opioid use, marijuana use, alcohol use, depression, PTSD, anxiety, GI bleed presenting with weakness.  Patient has had some ongoing weakness after recent admission.  She was admitted from 10/2 until 10/6 for pneumonia failing outpatient antibiotics and respiratory failure.  This improved and she completed antibiotic course.  While admitted she was noted to have worsening anemia and had Hemoccult positive.  EGD was positive for healing gastric ulcer with no active bleeding.  She was started on a PPI and was told to hold off on aspirin and NSAIDs for 2 days.  Plan was for GI follow-up.  Patient discharged with stable hemoglobin and restarted on home diuretics.  Hemoglobin at discharge was 9.5 and patient did received 1 unit transfusion on 10/3.  She presented the day following her discharge for reported near syncope.  She stated that she woke to go to the bathroom overnight and fell to the ground but no loss of consciousness.  Had reported some increase urination as well as some flank pain and possible dysuria during that presentation.  States she had not yet started her home medications.  Urinalysis showed only rare bacteria protein hemoglobin and glucose.  Patient was discharged home from the ED.  Patient Hailey Klein presented today due to concern for continued fatigue/weakness with some exertional shortness of breath.  And possible worsening edema.  She also reports she may have recurrent dark stools and some concerns about remaining at home.  She states she did take her home diuretic this morning with good response.  She denies fevers, chills, chest pain, abdominal pain, constipation, diarrhea, nausea,  vomiting.  ED Course: Vital signs in the ED notable for blood pressure in the 130s to 160s systolic.  Lab workup included BMP with bicarb 21, creatinine improving to 1.22, glucose 336, calcium 8.3.  CBC showed hemoglobin of 8.7 which is down from discharge hemoglobin of 9.5 though she feels like she may be retaining some fluid.  Chest x-ray showed improving aeration from previous pneumonia.  No edema noted on chest x-ray.  BNP indeterminate at 107 which is increased from yesterday but decreased from 2 days ago.  Troponin mildly elevated at 21 with repeat pending.  TSH mildly elevated at 5.4.  Urinalysis with again glucose, hemoglobin, protein, rare bacteria.  No interventions thus far in the ED.  After discussion repeat hemoglobin came back negative and repeat CBC is being obtained to monitor for continued worsening.  Review of Systems: As per HPI otherwise all other systems reviewed and are negative.  Past Medical History:  Diagnosis Date   Chronic kidney disease    Community acquired pneumonia 08/16/2023   Diabetes mellitus without complication (HCC)    Dyspnea    H/O suicide attempt    cut wrists - 19-y-o   High cholesterol    Hypertension    Hypokalemia 02/02/2023   MDD (major depressive disorder), recurrent episode, severe (HCC) 08/03/2023   Neuropathy    Pneumonia 08/15/2023   PTSD (post-traumatic stress disorder)     Past Surgical History:  Procedure Laterality Date   BIOPSY  08/19/2023   Procedure: BIOPSY;  Surgeon: Jenel Lucks, MD;  Location: [facility] ENDOSCOPY;  Service: Gastroenterology;;   ESOPHAGOGASTRODUODENOSCOPY (EGD) WITH PROPOFOL N/A 08/19/2023  Procedure: ESOPHAGOGASTRODUODENOSCOPY (EGD) WITH PROPOFOL;  Surgeon: Jenel Lucks, MD;  Location: [facility] ENDOSCOPY;  Service: Gastroenterology;  Laterality: N/A;   LEFT HEART CATH AND CORONARY ANGIOGRAPHY N/A 02/02/2023   Procedure: LEFT HEART CATH AND CORONARY ANGIOGRAPHY;  Surgeon: Tonny Bollman, MD;  Location: [facility]  INVASIVE CV LAB;  Service: Cardiovascular;  Laterality: N/A;    Social History  reports that she has been smoking cigarettes. She started smoking about 9 months ago. She has a 0.8 pack-year smoking history. She does not have any smokeless tobacco history on file. She reports that she does not currently use alcohol. She reports current drug use. Drugs: Marijuana and Cocaine.  Allergies  Allergen Reactions   Aspirin Rash and Other (See Comments)    high doses ago    Family History  Problem Relation Age of Onset   Alcoholism Other   Reviewed  Prior to Admission medications   Medication Sig Start Date End Date Taking? Authorizing Provider  amLODipine (NORVASC) 10 MG tablet Take 1 tablet (10 mg total) by mouth daily. 08/10/23 09/09/23 Yes Karie Fetch, MD  ARIPiprazole (ABILIFY) 10 MG tablet Take 1 tablet (10 mg total) by mouth daily. 08/10/23 09/09/23 Yes Karie Fetch, MD  aspirin EC 81 MG tablet Take 1 tablet (81 mg total) by mouth daily. Swallow whole. 09/03/23  Yes Azucena Fallen, MD  atorvastatin (LIPITOR) 80 MG tablet Take 1 tablet (80 mg total) by mouth daily. 08/10/23 09/09/23 Yes Karie Fetch, MD  BYDUREON BCISE 2 MG/0.85ML AUIJ Inject 2 mg into the skin every Tuesday. 05/13/22  Yes [provider]  cyanocobalamin (VITAMIN B12) 500 MCG tablet Take 1 tablet (500 mcg total) by mouth daily. 08/03/23  Yes Hongalgi, Maximino Greenland, MD  folic acid (FOLVITE) 1 MG tablet Take 1 tablet (1 mg total) by mouth daily. 08/03/23  Yes Hongalgi, Maximino Greenland, MD  LANTUS SOLOSTAR 100 UNIT/ML Solostar Pen Inject 28 Units into the skin at bedtime. 05/13/22  Yes [provider]  losartan (COZAAR) 100 MG tablet Take 1 tablet (100 mg total) by mouth daily. 08/10/23 09/09/23 Yes Karie Fetch, MD  montelukast (SINGULAIR) 10 MG tablet Take 10 mg by mouth at bedtime. 05/13/22  Yes [provider]  pantoprazole (PROTONIX) 40 MG tablet Take 1 tablet (40 mg total) by mouth 2 (two) times  daily. 08/19/23  Yes Azucena Fallen, MD  potassium chloride (KLOR-CON M) 10 MEQ tablet Take 1 tablet (10 mEq total) by mouth daily. 08/10/23 09/09/23 Yes Karie Fetch, MD  pregabalin (LYRICA) 100 MG capsule Take 1 capsule (100 mg total) by mouth 2 (two) times daily. 08/10/23 09/09/23 Yes Karie Fetch, MD  SYMBICORT 160-4.5 MCG/ACT inhaler Inhale 2 puffs into the lungs 2 (two) times daily. 05/13/22  Yes [provider]  thiamine (VITAMIN B1) 100 MG tablet Take 100 mg by mouth 2 (two) times daily. 05/13/22  Yes [provider]  torsemide (DEMADEX) 20 MG tablet Take 1 tablet (20 mg total) by mouth daily. 08/19/23 09/18/23 Yes Azucena Fallen, MD    Physical Exam: Vitals:   08/22/23 1345 08/22/23 1349 08/22/23 1430 08/22/23 1549  BP: (!) 168/86  (!) 142/82 (!) 165/90  Pulse: 79  76 78  Resp: (!) 26  (!) 22 (!) 21  Temp:  98 F (36.7 C)  98.4 F (36.9 C)  TempSrc:  Oral  Oral  SpO2: 100%  100% 100%  Weight:      Height:        Physical Exam Constitutional:  General: She is not in acute distress.    Appearance: Normal appearance. She is obese.  HENT:     Head: Normocephalic and atraumatic.     Mouth/Throat:     Mouth: Mucous membranes are moist.     Pharynx: Oropharynx is clear.  Eyes:     Extraocular Movements: Extraocular movements intact.     Pupils: Pupils are equal, round, and reactive to light.  Cardiovascular:     Rate and Rhythm: Normal rate and regular rhythm.     Pulses: Normal pulses.     Heart sounds: Normal heart sounds.  Pulmonary:     Effort: Pulmonary effort is normal. No respiratory distress.     Breath sounds: Normal breath sounds. No rales.  Abdominal:     General: Bowel sounds are normal. There is no distension.     Palpations: Abdomen is soft.     Tenderness: There is no abdominal tenderness.  Musculoskeletal:        General: No swelling or deformity.     Right lower leg: Edema present.     Left lower leg: Edema present.   Skin:    General: Skin is warm and dry.  Neurological:     General: No focal deficit present.     Mental Status: Mental status is at baseline.    Labs on Admission: I have personally reviewed following labs and imaging studies  CBC: Recent Labs  Lab 08/18/23 0120 08/19/23 0826 08/19/23 2126 08/20/23 0406 08/20/23 0412 08/21/23 0700 08/22/23 1102  WBC 9.9 9.6 10.0  --   --  7.9 7.9  HGB 8.5* 8.9* 9.5* 9.3* 9.2* 9.2* 8.7*  HCT 26.2* 27.4* 30.9* 29.3* 27.0* 28.2* 27.7*  MCV 89.4 89.0 91.4  --   --  90.4 91.7  PLT 353 367 401*  --   --  389 374    Basic Metabolic Panel: Recent Labs  Lab 08/19/23 0826 08/19/23 2126 08/20/23 0406 08/20/23 0412 08/20/23 2325 08/22/23 1102  NA 134* 129* 133* 134* 133* 138  K 3.3* 4.4 4.3 4.3 3.2* 3.6  CL 101 96* 100  --  101 106  CO2 23 20* 21*  --  21* 21*  GLUCOSE 301* 713* 551*  --  397* 336*  BUN 26* 29* 29*  --  30* 18  CREATININE 1.35* 1.82* 1.62*  --  1.48* 1.22*  CALCIUM 8.3* 8.2* 8.3*  --  8.3* 8.3*    GFR: Estimated Creatinine Clearance: 70.7 mL/min (A) (by C-G formula based on SCr of 1.22 mg/dL (H)).  Liver Function Tests: Recent Labs  Lab 08/16/23 0503 08/19/23 2126 08/20/23 2325  AST 20 16 16   ALT 29 26 27   ALKPHOS 87 126 114  BILITOT 0.4 0.4 0.3  PROT 6.4* 7.8 7.7  ALBUMIN 2.3* 2.9* 3.3*    Urine analysis:    Component Value Date/Time   COLORURINE YELLOW 08/22/2023 1102   APPEARANCEUR HAZY (A) 08/22/2023 1102   LABSPEC 1.017 08/22/2023 1102   PHURINE 5.0 08/22/2023 1102   GLUCOSEU >=500 (A) 08/22/2023 1102   HGBUR MODERATE (A) 08/22/2023 1102   BILIRUBINUR NEGATIVE 08/22/2023 1102   KETONESUR NEGATIVE 08/22/2023 1102   PROTEINUR >=300 (A) 08/22/2023 1102   NITRITE NEGATIVE 08/22/2023 1102   LEUKOCYTESUR NEGATIVE 08/22/2023 1102    Radiological Exams on Admission: DG Chest 2 View  Result Date: 08/22/2023 CLINICAL DATA:  Exertional shortness of breath EXAM: CHEST - 2 VIEW COMPARISON:  X-ray and CT  angiogram 08/15/2023 FINDINGS: Interval slight decrease  in patchy bilateral lung opacities with significant residual along the lung bases. Stable enlarged cardiopericardial silhouette with prominent central vasculature. No pneumothorax or effusion. Frontal view is kyphotic limiting evaluation of the apices. Mild degenerative changes of the spine. IMPRESSION: Decreasing lung opacities with some residual. Recommend continued follow-up. Electronically Signed   By: Karen Kays M.D.   On: 08/22/2023 13:38    EKG: Independently reviewed.  Sinus rhythm at 87 bpm.  Right bundle branch block.  Read as left anterior fascicular block.  Minimal baseline artifact aVL and 3.  Nonspecific T wave changes.  Similar to previous.  Assessment/Plan Active Problems:   Essential hypertension   Type 2 diabetes mellitus with hyperlipidemia (HCC)   PTSD (post-traumatic stress disorder)   Major depression, recurrent, chronic (HCC)   Cocaine abuse (HCC)   Opioid use disorder, moderate, in sustained remission (HCC)   GAD (generalized anxiety disorder)   Chronic coronary artery disease   Class 3 severe obesity due to excess calories without serious comorbidity in adult [facility])   Diabetic polyneuropathy associated with type 2 diabetes mellitus (HCC)   Normocytic anemia   Anemia Gastric ulcer Weakness/fatigue Chronic diastolic CHF BNP elevation > Patient presenting with concerns for continued weakness/fatigue and some dyspnea on exertion.  Also concerned that she may have repeat dark stools. > As per HPI was recently admitted with pneumonia and while admitted noted to have worsening anemia with evidence of healing gastric ulcer on EGD.  Started on PPI. > Hemoglobin trend: 8.0 following transfusion during recent admission then drifted up to 8.9, then 9.5.  Recent ED evaluation showed hemoglobin around 9.2 and today hemoglobin came back at 8.7.  FOBT is reassuringly negative however.   > BNP is higher than yesterday but  improved from 2 days ago.  BNP is around 107 which falls in a mildly elevated/indeterminate range.  Could be somewhat falsely low due to obesity but patient is not hypoxic and continues to respond to outpatient diuretic. > There could be component of dilution contributing to this worsened anemia given she is FOBT negative.  It reports some increased edema.  She does however continue to respond to diuretics as above so do not feel that she needs admission for inpatient diuresis.  Otherwise her weight is improved from discharge and her creatinine is improving as well. > EDP is obtaining repeat CBC to monitor for further downtrend while patient has been observed in the ED.   > I will also recommend walk test to monitor for hypoxia with ambulation which if positive would also be indication for admission. - If CBC comes back significantly worsened would recommend readmission for concern of recurrent GI bleeding despite negative FOBT. - If CBC is stable then suspect this is largely dilutional and would recommend dose of IV Lasix, increase torsemide to twice daily tomorrow and follow-up with PCP on Friday to evaluate response to interventions and ensure stable hemoglobin.  - Continue with PPI - PCP follow-up and continue with outpatient GI follow-up - Avoid aspirin and NSAIDs  Hypertension - Continue home amlodipine, losartan  Diabetes - Continue home insulin and outpatient medications  Neuropathy - Continue Lyrica  Depression Anxiety PTSD - Continue Abilify  Nonobstructive CAD - Hold off on aspirin for now - Continue home losartan  We appreciate this consultation.  Will continue to follow/admit if additional testing indicates admission is warranted.  Synetta Fail MD [facility]

## 2023-08-22 NOTE — ED Provider Triage Note (Signed)
Emergency Medicine Provider Triage Evaluation Note  Elise Knobloch , a 57 y.o. female  was evaluated in triage.  Pt complains of worsening fatigue, peripheral edema, and exertional shortness of breath.  Review of Systems  Positive: Worsening fatigue, exertional shortness of breath, no energy, peripheral edema worsening Negative: Dysuria, constipation, diarrhea, abdominal pain, chest pain, headache, fevers, chills, cough.  Physical Exam  BP 139/77   Pulse 85   Temp 98.4 F (36.9 C)   Resp 18   Ht 5\' 3"  (1.6 m)   Wt 136.1 kg   LMP 07/21/2015   SpO2 100%   BMI 53.14 kg/m  Gen:   Awake, no distress resting comfortably with reassuring vital signs and exam chair Resp:  Normal effort possible faint rales MSK:   Moves extremities without difficulty , peripheral edema present Other:  Patient resting in no distress  Medical Decision Making  Medically screening exam initiated at 11:42 AM.  Appropriate orders placed.  Ophie Burrowes was informed that the remainder of the evaluation will be completed by another provider, this initial triage assessment does not replace that evaluation, and the importance of remaining in the ED until their evaluation is complete.  Pura Picinich is a 57 y.o. female with past medical history significant for CAD with previous NSTEMI, migraines, obesity, CHF, anxiety, depression, recent pneumonia, and previous EGD who presents again for worsening fatigue, peripheral edema worsening, and exertional shortness of breath.  According to patient, for the last 24 hours this has been worsening.  She reports no energy and thinks everything is swelling more despite taking medications.  She denies any chest pain or cough but does report exertional shortness of breath is worse.    Will get screening labs, chest x-ray with her exertional breath, troponin, BNP with the edema with heart failure history, and other workup.  Anticipate further evaluation by a provider when she gets  to an exam room.      Jonathen Rathman, Canary Brim, MD 08/22/23 1152

## 2023-08-23 ENCOUNTER — Encounter (HOSPITAL_COMMUNITY): Payer: Self-pay | Admitting: Psychiatry

## 2023-08-23 DIAGNOSIS — F1420 Cocaine dependence, uncomplicated: Secondary | ICD-10-CM

## 2023-08-23 DIAGNOSIS — F1220 Cannabis dependence, uncomplicated: Secondary | ICD-10-CM

## 2023-08-23 DIAGNOSIS — F333 Major depressive disorder, recurrent, severe with psychotic symptoms: Secondary | ICD-10-CM | POA: Diagnosis not present

## 2023-08-23 DIAGNOSIS — F411 Generalized anxiety disorder: Secondary | ICD-10-CM | POA: Diagnosis not present

## 2023-08-23 DIAGNOSIS — F4310 Post-traumatic stress disorder, unspecified: Secondary | ICD-10-CM

## 2023-08-23 LAB — CBC WITH DIFFERENTIAL/PLATELET
Abs Immature Granulocytes: 0.03 10*3/uL (ref 0.00–0.07)
Basophils Absolute: 0 10*3/uL (ref 0.0–0.1)
Basophils Relative: 1 %
Eosinophils Absolute: 0.2 10*3/uL (ref 0.0–0.5)
Eosinophils Relative: 3 %
HCT: 29 % — ABNORMAL LOW (ref 36.0–46.0)
Hemoglobin: 9.3 g/dL — ABNORMAL LOW (ref 12.0–15.0)
Immature Granulocytes: 0 %
Lymphocytes Relative: 29 %
Lymphs Abs: 2.2 10*3/uL (ref 0.7–4.0)
MCH: 29 pg (ref 26.0–34.0)
MCHC: 32.1 g/dL (ref 30.0–36.0)
MCV: 90.3 fL (ref 80.0–100.0)
Monocytes Absolute: 0.7 10*3/uL (ref 0.1–1.0)
Monocytes Relative: 9 %
Neutro Abs: 4.4 10*3/uL (ref 1.7–7.7)
Neutrophils Relative %: 58 %
Platelets: 392 10*3/uL (ref 150–400)
RBC: 3.21 MIL/uL — ABNORMAL LOW (ref 3.87–5.11)
RDW: 12.7 % (ref 11.5–15.5)
WBC: 7.6 10*3/uL (ref 4.0–10.5)
nRBC: 0 % (ref 0.0–0.2)

## 2023-08-23 LAB — COMPREHENSIVE METABOLIC PANEL
ALT: 21 U/L (ref 0–44)
AST: 12 U/L — ABNORMAL LOW (ref 15–41)
Albumin: 2.7 g/dL — ABNORMAL LOW (ref 3.5–5.0)
Alkaline Phosphatase: 101 U/L (ref 38–126)
Anion gap: 9 (ref 5–15)
BUN: 12 mg/dL (ref 6–20)
CO2: 23 mmol/L (ref 22–32)
Calcium: 8.5 mg/dL — ABNORMAL LOW (ref 8.9–10.3)
Chloride: 104 mmol/L (ref 98–111)
Creatinine, Ser: 1.05 mg/dL — ABNORMAL HIGH (ref 0.44–1.00)
GFR, Estimated: >60 mL/min (ref >60)
Glucose, Bld: 306 mg/dL — ABNORMAL HIGH (ref 70–99)
Potassium: 3.5 mmol/L (ref 3.5–5.1)
Sodium: 136 mmol/L (ref 135–145)
Total Bilirubin: 0.7 mg/dL (ref 0.3–1.2)
Total Protein: 7.4 g/dL (ref 6.5–8.1)

## 2023-08-23 LAB — ACETAMINOPHEN LEVEL: Acetaminophen (Tylenol), Serum: <10 ug/mL — ABNORMAL LOW (ref 10–30)

## 2023-08-23 LAB — RAPID URINE DRUG SCREEN, HOSP PERFORMED
Amphetamines: NOT DETECTED
Barbiturates: NOT DETECTED
Benzodiazepines: NOT DETECTED
Cocaine: POSITIVE — AB
Opiates: NOT DETECTED
Tetrahydrocannabinol: NOT DETECTED

## 2023-08-23 LAB — SALICYLATE LEVEL: Salicylate Lvl: <7 mg/dL — ABNORMAL LOW (ref 7.0–30.0)

## 2023-08-23 LAB — ETHANOL: Alcohol, Ethyl (B): <10 mg/dL (ref <10)

## 2023-08-23 LAB — SURGICAL PATHOLOGY

## 2023-08-23 MED ORDER — ACETAMINOPHEN 500 MG PO TABS
500.0000 mg | ORAL_TABLET | Freq: Four times a day (QID) | ORAL | Status: DC | PRN
  Administered 2023-08-23 – 2023-08-24 (×2): 500 mg via ORAL
  Filled 2023-08-23 (×2): qty 1

## 2023-08-23 MED ORDER — ACETAMINOPHEN 325 MG PO TABS
650.0000 mg | ORAL_TABLET | Freq: Once | ORAL | Status: AC
  Administered 2023-08-23: 650 mg via ORAL
  Filled 2023-08-23: qty 2

## 2023-08-23 MED ORDER — VENLAFAXINE HCL ER 75 MG PO CP24
75.0000 mg | ORAL_CAPSULE | Freq: Every morning | ORAL | Status: DC
  Administered 2023-08-24: 75 mg via ORAL
  Filled 2023-08-23: qty 1

## 2023-08-23 MED ORDER — MIRTAZAPINE 15 MG PO TABS
15.0000 mg | ORAL_TABLET | Freq: Every day | ORAL | Status: DC
  Administered 2023-08-23: 15 mg via ORAL
  Filled 2023-08-23: qty 1

## 2023-08-23 MED ORDER — ARIPIPRAZOLE 10 MG PO TABS
10.0000 mg | ORAL_TABLET | Freq: Every day | ORAL | Status: DC
  Administered 2023-08-23 – 2023-08-24 (×2): 10 mg via ORAL
  Filled 2023-08-23 (×2): qty 1

## 2023-08-23 NOTE — Discharge Instructions (Signed)
X rays are without fracture. Follow up with your PCP. Return to the emergency room for concerning symptoms.

## 2023-08-23 NOTE — ED Notes (Signed)
Pt  brought to [facility] in wheelchair. Pt in gown instead of scrubs d/t not having the appropriate sized scrubs for pt

## 2023-08-23 NOTE — ED Notes (Signed)
Patient is resting comfortably. 

## 2023-08-23 NOTE — ED Notes (Signed)
Pt reports that she has hallucinations of her sister, who is passed away, and cockroaches. Pt reports her sister tells her to "come join her". Pt plan for suicide is to wrap cords around her neck. Pt reports having SI x weeks. Pt w/ depressed affect, soft speech, cooperative, guarded.

## 2023-08-23 NOTE — ED Notes (Signed)
Per report, pt was being discharged when she reported having auditory hallucinations and SI.

## 2023-08-23 NOTE — ED Notes (Signed)
Pt ambulatory to bathroom

## 2023-08-23 NOTE — Consult Note (Addendum)
BH ED ASSESSMENT   Reason for Consult: Psych Consult, "SI and auditory hallucinations" Referring Physician:  Marita Kansas, PA-C  Patient Identification: Hailey Klein MRN:  [id number] ED Chief Complaint: MDD (major depressive disorder), recurrent, severe, with psychosis (HCC)  Diagnosis:  Principal Problem:   MDD (major depressive disorder), recurrent, severe, with psychosis (HCC) Active Problems:   PTSD (post-traumatic stress disorder)   GAD (generalized anxiety disorder)   Cocaine use disorder, severe, dependence (HCC)   Cannabis use disorder, severe, dependence (HCC)   ED Assessment Time Calculation: Start Time: 1430 Stop Time: 1530 Total Time in Minutes (Assessment Completion): 60   Subjective:    Hailey Klein is a 57 y.o. 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  Patient currently is voluntary and medically cleared per EDP team.  HPI:   Patient seen today at the [facility] emergency department for face-to-face psychiatric evaluation.  Upon evaluation, patient endorses that as she was being discharged from the emergency department, due to her medical concerns for bilateral injuries  to her knees due to [place] the bus station recently upon discharge from the hospital, states that she began to have a recrudescence of auditory hallucinations of hearing her dead sister, worsening depressive mood, and a recrudescence of suicidal ideations with a plan and desire to hang herself with her patient room telemetry cords, prompting a psychiatric consult to be placed.  Patient endorses that she feels like her psychosocial stressors upon being in the process of being discharged from the hospital, "just hit me", states that she has been experiencing homelessness for the last 2 months (living in motels before this), reports that she has been struggling since the beginning of September around thoughts of her sister dying last year at this time of year, her boyfriend left her, has financial struggles due to limited and fixed income (ie., states she is on SSI for "alcohol and drug use"), and has been having a lot of increasing medical complications lately that has been stressful (per chart review, has had recent and frequent hospital utilization for things such as CAP and AKI). Patient endorses additionally that she continues to use drugs which temporarily helps with her mood, but in periods of briefly not using, states that her mood decompensates. UDS notably positive for cocaine on 08/23/2023.   Patient endorses she continues to utilize drugs, particularly cocaine and cannabis daily, states she utilizes about a gram per day of cocaine, and smokes cannabis about every night when she is able. Patient endorses about half pack of cigarettes per day, started at the age of 58. Patient endorses she has been using cocaine for the last 5 years consistently, states also using cannabis since about this time as well.  Patient endorses no current EtOH use, states last drink was 4 to 5 years ago, used to  have a problem with alcohol in the past, describes severe withdrawal in the past, but does not articulate any  further than this.  Patient reports trying meth and heroin in the past, denies any in recent years.  Patient endorses using rehabilitation services in the past, states that at one point she did a [facility] residential program for ETOH.  Patient endorses during evaluation she continues to have suicidal thoughts that are passive in nature with no specific plan, but states that outside of the safe and secured environment of the hospital, would likely harm herself in an attempt to end her life.  Patient endorses multiple suicide attempts in the past, states last time was she attempted to cut her wrist in 2016, states 4 previous attempts in total.  Patient endorses multiple hospitalizations for decompensation of her mental health, appreciably was at [facility] 9/20 to 08/10/2023, states that she was hospitalized 2 other times prior to this.  Patient endorses no history of self injurious behavior.  Patient endorses during evaluation she continues to hear her dead sister, states that she hears her sister during periods of severe depression, states that during evaluation her mood is depressed and down.  Patient orientation was intact, no concerns for fluctuations of consciousness.  Patient endorses no history of outpatient mental health services utilization and/or therapy services.  Patient endorses desire for inpatient hospitalization, wants to restart her psychiatric medications previously prescribed at [facility] recently, states that they were helpful, states that she has not been taking them consistently unfortunately.  Patient endorses no visual hallucinations, paranoid ideations, and/or delusional themes, and objectively, the patient does not appear to be presenting with psychotic features.  Patient endorses no homicidal ideations.  Patient endorses fair sleep normally and good eating.   Past Psychiatric History: As above and recently admitted at [facility] from 08/03/2023 to 08/10/2023  Per Dr. Standley Brooking [facility] discharge  08/10/2023  Depression, Anxiety, PTSD, and Substance Abuse (Cocaine, Hx of EtOH), and 4 Suicide Attempts (last- [facility] 2016) and 3 Prior Psychiatric Hospitalizations (last- [facility] 11/2015), and no history of Self Injurious Behavior.   Risk to Self or Others: Is the patient at risk to self? Yes Has the patient been a risk to self in the past 6 months? Yes Has the patient been a risk to self within the distant past? Yes Is the patient a risk to others? No Has the patient been a risk to others in the past 6 months? No Has the patient been a risk to others within the distant past? No  [place] Scale:  Flowsheet Row ED from 08/22/2023 in [facility] Emergency Department at [facility] Most recent reading at 08/22/2023 10:06 PM ED from 08/22/2023 in [facility] Emergency Department at [facility] Most recent reading at 08/22/2023 10:40 AM ED from 08/20/2023 in [facility] Emergency Department at [facility] Most recent reading at 08/20/2023 11:08 PM  C-SSRS RISK CATEGORY No Risk No Risk No Risk       AIMS: 0  Substance Abuse: Patient affirms no new history of substance use  Per Dr. Theodis Aguas, MD at [facility] 08/04/2023  Alcohol: Denies any recent drinking , last drank 4-5 years. Reports prior alcohol use disorder, drank daily, describes severe withdrawal symptoms. Reports a pastor helped her become sober.  Tobacco: Smoke currently since age 72, currently smoking ~ 0.5 ppd. Interested in quitting.  Cannabis: Smokes 0.5 blunt of cannabis a week, purchased from dealer. Uses it for sleep, will smoke before bedtime.  Other  Illicit drugs: Cocaine: Using for the past 5 years, uses 1 gram daily , route intrasal. Reports it helps with her chronic pain. Withdrawals characterized by body aches, headaches and insomnia.  Meth: No current use, smoked it a few times back in 2019 Heroin: tried it once and an OD in 2018 Rx drug abuse: Rehab hx: Reports she went to [facility] years ago  for her alcohol abuse.   Past Medical History:  Past Medical History:  Diagnosis Date   Chronic kidney disease    Community acquired pneumonia 08/16/2023   Diabetes mellitus without complication (HCC)    Dyspnea    H/O suicide attempt    cut wrists - 19-y-o   High cholesterol    Hypertension    Hypokalemia 02/02/2023   MDD (major depressive disorder), recurrent episode, severe (HCC) 08/03/2023   Neuropathy    Pneumonia 08/15/2023   PTSD (post-traumatic stress disorder)     Past Surgical History:  Procedure Laterality Date   BIOPSY  08/19/2023   Procedure: BIOPSY;  Surgeon: Jenel Lucks, MD;  Location: [facility] ENDOSCOPY;  Service: Gastroenterology;;   ESOPHAGOGASTRODUODENOSCOPY (EGD) WITH PROPOFOL N/A 08/19/2023   Procedure: ESOPHAGOGASTRODUODENOSCOPY (EGD) WITH PROPOFOL;  Surgeon: Jenel Lucks, MD;  Location: [facility] ENDOSCOPY;  Service: Gastroenterology;  Laterality: N/A;   LEFT HEART CATH AND CORONARY ANGIOGRAPHY N/A 02/02/2023   Procedure: LEFT HEART CATH AND CORONARY ANGIOGRAPHY;  Surgeon: Tonny Bollman, MD;  Location: [facility] INVASIVE CV LAB;  Service: Cardiovascular;  Laterality: N/A;   Family History:  Family History  Problem Relation Age of Onset   Alcoholism Other    Family Psychiatric  History: None endorsed Social History:  Social History   Substance and Sexual Activity  Alcohol Use Not Currently     Social History   Substance and Sexual Activity  Drug Use Yes   Types: Marijuana, Cocaine   Comment: using cocaine ''    Social History   Socioeconomic History   Marital status: Single    Spouse name: Not on file   Number of children: 0   Years of education: Not on file   Highest education level: High school graduate  Occupational History   Occupation: Disabilty  Tobacco Use   Smoking status: Every Day    Current packs/day: 1.00    Average packs/day: 1 pack/day for 0.8 years (0.8 ttl pk-yrs)    Types: Cigarettes    Start date: 2024   Smokeless  tobacco: Not on file  Vaping Use   Vaping status: Never Used  Substance and Sexual Activity   Alcohol use: Not Currently   Drug use: Yes    Types: Marijuana, Cocaine    Comment: using cocaine ''   Sexual activity: Yes  Other Topics Concern   Not on file  Social History Narrative   Not on file   Social Determinants of Health   Financial Resource Strain: Medium Risk (07/11/2022)   Overall Financial Resource Strain (CARDIA)    Difficulty of Paying Living Expenses: Somewhat hard  Food Insecurity: No Food Insecurity (08/15/2023)   Hunger Vital Sign    Worried About Running Out of Food in the Last Year: Never true    Ran Out of Food in the Last Year: Never true  Recent Concern: Food Insecurity - Food Insecurity Present (08/01/2023)   Hunger Vital Sign    Worried About Running Out of Food in the Last Year: Sometimes true    Ran Out of Food in the Last Year: Sometimes true  Transportation  Needs: No Transportation Needs (08/15/2023)   PRAPARE - Administrator, Civil Service (Medical): No    Lack of Transportation (Non-Medical): No  Recent Concern: Transportation Needs - Unmet Transportation Needs (08/01/2023)   PRAPARE - Administrator, Civil Service (Medical): Yes    Lack of Transportation (Non-Medical): Yes  Physical Activity: Not on file  Stress: Not on file  Social Connections: Not on file   Additional Social History:    Allergies:   Allergies  Allergen Reactions   Aspirin Rash and Other (See Comments)    high doses ago    Labs:  Results for orders placed or performed during the hospital encounter of 08/22/23 (from the past 48 hour(s))  Acetaminophen level     Status: Abnormal   Collection Time: 08/23/23  8:06 AM  Result Value Ref Range   Acetaminophen (Tylenol), Serum <10 (L) 10 - 30 ug/mL    Comment: (NOTE) Therapeutic concentrations vary significantly. A range of 10-30 ug/mL  may be an effective concentration for many patients. However, some   are best treated at concentrations outside of this range. Acetaminophen concentrations >150 ug/mL at 4 hours after ingestion  and >50 ug/mL at 12 hours after ingestion are often associated with  toxic reactions.  Performed at [facility], [address] [place], [place] [address]   CBC with Differential/Platelet     Status: Abnormal   Collection Time: 08/23/23  8:06 AM  Result Value Ref Range   WBC 7.6 4.0 - 10.5 K/uL   RBC 3.21 (L) 3.87 - 5.11 MIL/uL   Hemoglobin 9.3 (L) 12.0 - 15.0 g/dL   HCT 40.9 (L) 81.1 - 91.4 %   MCV 90.3 80.0 - 100.0 fL   MCH 29.0 26.0 - 34.0 pg   MCHC 32.1 30.0 - 36.0 g/dL   RDW 78.2 95.6 - 21.3 %   Platelets 392 150 - 400 K/uL   nRBC 0.0 0.0 - 0.2 %   Neutrophils Relative % 58 %   Neutro Abs 4.4 1.7 - 7.7 K/uL   Lymphocytes Relative 29 %   Lymphs Abs 2.2 0.7 - 4.0 K/uL   Monocytes Relative 9 %   Monocytes Absolute 0.7 0.1 - 1.0 K/uL   Eosinophils Relative 3 %   Eosinophils Absolute 0.2 0.0 - 0.5 K/uL   Basophils Relative 1 %   Basophils Absolute 0.0 0.0 - 0.1 K/uL   Immature Granulocytes 0 %   Abs Immature Granulocytes 0.03 0.00 - 0.07 K/uL    Comment: Performed at [facility], [address], [place], [place] [address]  Comprehensive metabolic panel     Status: Abnormal   Collection Time: 08/23/23  8:06 AM  Result Value Ref Range   Sodium 136 135 - 145 mmol/L   Potassium 3.5 3.5 - 5.1 mmol/L   Chloride 104 98 - 111 mmol/L   CO2 23 22 - 32 mmol/L   Glucose, Bld 306 (H) 70 - 99 mg/dL    Comment: Glucose reference range applies only to samples taken after fasting for at least 8 hours.   BUN 12 6 - 20 mg/dL   Creatinine, Ser 8.46 (H) 0.44 - 1.00 mg/dL   Calcium 8.5 (L) 8.9 - 10.3 mg/dL   Total Protein 7.4 6.5 - 8.1 g/dL   Albumin 2.7 (L) 3.5 - 5.0 g/dL   AST 12 (L) 15 - 41 U/L   ALT 21 0 - 44 U/L   Alkaline Phosphatase 101  38 - 126 U/L   Total Bilirubin 0.7 0.3 - 1.2 mg/dL   GFR, Estimated >88 >41 mL/min    Comment:  (NOTE) Calculated using the CKD-EPI Creatinine Equation (2021)    Anion gap 9 5 - 15    Comment: Performed at [facility], [address] [place], [place] [address]  Ethanol     Status: None   Collection Time: 08/23/23  8:06 AM  Result Value Ref Range   Alcohol, Ethyl (B) <10 <10 mg/dL    Comment: (NOTE) Lowest detectable limit for serum alcohol is 10 mg/dL.  For medical purposes only. Performed at [facility], [address] [place], [place] [address]   Salicylate level     Status: Abnormal   Collection Time: 08/23/23  8:06 AM  Result Value Ref Range   Salicylate Lvl <7.0 (L) 7.0 - 30.0 mg/dL    Comment: Performed at [facility], [address] [place], [place] [address]  Rapid urine drug screen (hospital performed)     Status: Abnormal   Collection Time: 08/23/23 10:16 AM  Result Value Ref Range   Opiates NONE DETECTED NONE DETECTED   Cocaine POSITIVE (A) NONE DETECTED   Benzodiazepines NONE DETECTED NONE DETECTED   Amphetamines NONE DETECTED NONE DETECTED   Tetrahydrocannabinol NONE DETECTED NONE DETECTED   Barbiturates NONE DETECTED NONE DETECTED    Comment: (NOTE) DRUG SCREEN FOR MEDICAL PURPOSES ONLY.  IF CONFIRMATION IS NEEDED FOR ANY PURPOSE, NOTIFY LAB WITHIN 5 DAYS.  LOWEST DETECTABLE LIMITS FOR URINE DRUG SCREEN Drug Class                     Cutoff (ng/mL) Amphetamine and metabolites    1000 Barbiturate and metabolites    200 Benzodiazepine                 200 Opiates and metabolites        300 Cocaine and metabolites        300 THC                            50 Performed at [facility], [address], [place], [place] [address]     No current facility-administered medications for this encounter.   Current Outpatient Medications  Medication Sig Dispense Refill   amLODipine (NORVASC) 10 MG tablet Take 1 tablet (10 mg total) by mouth daily. 30 tablet 0   ARIPiprazole (ABILIFY) 10 MG tablet Take 1 tablet (10 mg total) by  mouth daily. 30 tablet 0   [START ON 09/03/2023] aspirin EC 81 MG tablet Take 1 tablet (81 mg total) by mouth daily. Swallow whole. 30 tablet 11   atorvastatin (LIPITOR) 80 MG tablet Take 1 tablet (80 mg total) by mouth daily. 30 tablet 0   BYDUREON BCISE 2 MG/0.85ML AUIJ Inject 2 mg into the skin every Tuesday.     cyanocobalamin (VITAMIN B12) 500 MCG tablet Take 1 tablet (500 mcg total) by mouth daily. 30 tablet 0   folic acid (FOLVITE) 1 MG tablet Take 1 tablet (1 mg total) by mouth daily. 30 tablet 0   LANTUS SOLOSTAR 100 UNIT/ML Solostar Pen Inject 28 Units into the skin at bedtime.     losartan (COZAAR) 100 MG tablet Take 1 tablet (100 mg total) by mouth daily. 30 tablet 0   montelukast (SINGULAIR) 10 MG tablet Take 10 mg by mouth at bedtime.  pantoprazole (PROTONIX) 40 MG tablet Take 1 tablet (40 mg total) by mouth 2 (two) times daily. 60 tablet 0   potassium chloride (KLOR-CON M) 10 MEQ tablet Take 1 tablet (10 mEq total) by mouth daily. 30 tablet 0   pregabalin (LYRICA) 100 MG capsule Take 1 capsule (100 mg total) by mouth 2 (two) times daily. 60 capsule 0   SYMBICORT 160-4.5 MCG/ACT inhaler Inhale 2 puffs into the lungs 2 (two) times daily.     thiamine (VITAMIN B1) 100 MG tablet Take 100 mg by mouth 2 (two) times daily.     torsemide (DEMADEX) 20 MG tablet Take 1 tablet (20 mg total) by mouth daily. 30 tablet 0    Musculoskeletal: Strength & Muscle Tone: within normal limits Gait & Station: normal Patient leans: N/A   Psychiatric Specialty Exam: Presentation  General Appearance:  Appropriate for Environment  Eye Contact: Fair  Speech: Clear and Coherent; Normal Rate  Speech Volume: Normal  Handedness: Right   Mood and Affect  Mood: Depressed; Anxious  Affect: Appropriate   Thought Process  Thought Processes: Goal Directed; Linear; Coherent  Descriptions of Associations:Intact  Orientation:Full (Time, Place and Person)  Thought  Content:Logical  History of Schizophrenia/Schizoaffective disorder:No  Duration of Psychotic Symptoms:Greater than six months  Hallucinations:Hallucinations: Auditory Description of Auditory Hallucinations: Reports hearing her dead sister  Ideas of Reference:None  Suicidal Thoughts:Suicidal Thoughts: Yes, Passive SI Passive Intent and/or Plan: Without Intent; Without Plan; With Means to Carry Out; With Access to Means  Homicidal Thoughts:Homicidal Thoughts: No   Sensorium  Memory: Immediate Fair; Recent Fair; Remote Poor  Judgment: Intact  Insight: Present   Executive Functions  Concentration: Fair  Attention Span: Fair  Recall: Other (comment) (Variable)  Fund of Knowledge: Fair  Language: Fair   Psychomotor Activity  Psychomotor Activity: Psychomotor Activity: Decreased   Assets  Assets: Communication Skills; Desire for Improvement; Financial Resources/Insurance; Intimacy; Leisure Time; Resilience    Sleep  Sleep: Sleep: Fair   Physical Exam: Physical Exam Vitals and nursing note reviewed.  Constitutional:      General: She is not in acute distress.    Appearance: She is obese. She is not ill-appearing, toxic-appearing or diaphoretic.  Pulmonary:     Effort: Pulmonary effort is normal.  Skin:    General: Skin is warm and dry.  Neurological:     Mental Status: She is alert and oriented to person, place, and time.  Psychiatric:        Attention and Perception: Attention normal. She perceives auditory (Reports hearing her dead sister) hallucinations.        Mood and Affect: Affect normal. Mood is anxious and depressed.        Speech: Speech normal.        Behavior: Behavior is slowed and withdrawn. Behavior is cooperative.        Thought Content: Thought content is not paranoid or delusional. Thought content includes suicidal (Reports passive thoughts) ideation. Thought content does not include homicidal ideation.        Cognition and  Memory: Cognition and memory normal.        Judgment: Judgment normal.    Review of Systems  Constitutional:  Positive for malaise/fatigue.  Musculoskeletal:  Positive for myalgias.  Psychiatric/Behavioral:  Positive for depression, hallucinations (Reports hearing dead sister), substance abuse (Cocaine, cannabis) and suicidal ideas. The patient is nervous/anxious.   All other systems reviewed and are negative.  Blood pressure (!) 127/112, pulse 70, temperature 97.8 F (36.6 C),  resp. rate 18, last menstrual period 07/21/2015, SpO2 100%. There is no height or weight on file to calculate BMI.  Medical Decision Making:  Patient presented as a psychiatric consult after initially being medically cleared and in the process of being discharged, when the patient began to endorse a recrudescence of auditory hallucinations of hearing her dead sister and worsening depressive mood with a recrudescence of suicidal ideations and a plan to harm herself by way of hospital equipment (telemetry cords).  Patient endorses passive suicidal ideations and decompensation of her mental health in the form of severe depression. Will recommend inpatient hospitalization at this time for safety and stability.  [facility] will review, as well as patient will be faxed out by CSW team, and psychiatry will continue to follow the patient until disposition is obtained.  Recommendations  #MDD (major depressive disorder), recurrent, severe, with psychosis (HCC) #PTSD (post-traumatic stress disorder) #GAD (generalized anxiety disorder) #Cocaine use disorder, severe, dependence (HCC) #Cannabis use disorder, severe, dependence (HCC)  -Recommend inpatient mental health hospitalization -Recommend outpatient psychiatric medications restarted -Recommend safety precautions  Disposition: Recommend psychiatric Inpatient admission when medically cleared.  Lenox Ponds, NP 08/23/2023 3:35 PM

## 2023-08-23 NOTE — ED Provider Notes (Addendum)
[place] EMERGENCY DEPARTMENT AT [facility] Provider Note   CSN: [id number] Arrival date & time: 08/22/23  2139     History  Chief Complaint  Patient presents with   Fall    Hailey Klein is a 57 y.o. female.  57 year old female presents today for concern of fall.  She was recently seen in the emergency department and states that [place] but [place] onto her knees.  Has a mechanical fall.  She complains of pain and swelling to the knees.  No head injury or loss of consciousness.  The history is provided by the patient. No language interpreter was used.       Home Medications Prior to Admission medications   Medication Sig Start Date End Date Taking? Authorizing Provider  amLODipine (NORVASC) 10 MG tablet Take 1 tablet (10 mg total) by mouth daily. 08/10/23 09/09/23  Karie Fetch, MD  ARIPiprazole (ABILIFY) 10 MG tablet Take 1 tablet (10 mg total) by mouth daily. 08/10/23 09/09/23  Karie Fetch, MD  aspirin EC 81 MG tablet Take 1 tablet (81 mg total) by mouth daily. Swallow whole. 09/03/23   Azucena Fallen, MD  atorvastatin (LIPITOR) 80 MG tablet Take 1 tablet (80 mg total) by mouth daily. 08/10/23 09/09/23  Karie Fetch, MD  BYDUREON BCISE 2 MG/0.85ML AUIJ Inject 2 mg into the skin every Tuesday. 05/13/22   [provider]  cyanocobalamin (VITAMIN B12) 500 MCG tablet Take 1 tablet (500 mcg total) by mouth daily. 08/03/23   Hongalgi, Maximino Greenland, MD  folic acid (FOLVITE) 1 MG tablet Take 1 tablet (1 mg total) by mouth daily. 08/03/23   Hongalgi, Maximino Greenland, MD  LANTUS SOLOSTAR 100 UNIT/ML Solostar Pen Inject 28 Units into the skin at bedtime. 05/13/22   [provider]  losartan (COZAAR) 100 MG tablet Take 1 tablet (100 mg total) by mouth daily. 08/10/23 09/09/23  Karie Fetch, MD  montelukast (SINGULAIR) 10 MG tablet Take 10 mg by mouth at bedtime. 05/13/22   [provider]  pantoprazole  (PROTONIX) 40 MG tablet Take 1 tablet (40 mg total) by mouth 2 (two) times daily. 08/19/23   Azucena Fallen, MD  potassium chloride (KLOR-CON M) 10 MEQ tablet Take 1 tablet (10 mEq total) by mouth daily. 08/10/23 09/09/23  Karie Fetch, MD  pregabalin (LYRICA) 100 MG capsule Take 1 capsule (100 mg total) by mouth 2 (two) times daily. 08/10/23 09/09/23  Karie Fetch, MD  SYMBICORT 160-4.5 MCG/ACT inhaler Inhale 2 puffs into the lungs 2 (two) times daily. 05/13/22   [provider]  thiamine (VITAMIN B1) 100 MG tablet Take 100 mg by mouth 2 (two) times daily. 05/13/22   [provider]  torsemide (DEMADEX) 20 MG tablet Take 1 tablet (20 mg total) by mouth daily. 08/19/23 09/18/23  Azucena Fallen, MD      Allergies    Aspirin    Review of Systems   Review of Systems  Constitutional:  Negative for fever.  Musculoskeletal:  Positive for arthralgias.  All other systems reviewed and are negative.   Physical Exam Updated Vital Signs BP (!) 160/93 (BP Location: Left Arm)   Pulse 72   Temp 98.1 F (36.7 C)   Resp (!) 23   LMP 07/21/2015   SpO2 100%  Physical Exam Vitals and nursing note reviewed.  Constitutional:      General: She is not in acute distress.    Appearance: Normal appearance. She  is not ill-appearing.  HENT:     Head: Normocephalic and atraumatic.     Nose: Nose normal.  Eyes:     Conjunctiva/sclera: Conjunctivae normal.  Cardiovascular:     Rate and Rhythm: Normal rate and regular rhythm.  Pulmonary:     Effort: Pulmonary effort is normal. No respiratory distress.  Musculoskeletal:        General: No deformity.  Skin:    Findings: No rash.  Neurological:     Mental Status: She is alert.     ED Results / Procedures / Treatments   Labs (all labs ordered are listed, but only abnormal results are displayed) Labs Reviewed - No data to display  EKG None  Radiology DG Knee Complete 4 Views Left  Result Date: 08/22/2023 CLINICAL  DATA:  Knee pain after fall. EXAM: LEFT KNEE - COMPLETE 4+ VIEW COMPARISON:  None Available. FINDINGS: No acute fracture or dislocation. Medial tibiofemoral joint space narrowing and peripheral spurring. No significant knee joint effusion. Small quadriceps tendon enthesophyte. Generalized soft tissue edema. IMPRESSION: 1. No acute fracture or dislocation. 2. Medial tibiofemoral osteoarthritis. Electronically Signed   By: Narda Rutherford M.D.   On: 08/22/2023 23:43   DG Knee Complete 4 Views Right  Result Date: 08/22/2023 CLINICAL DATA:  Knee pain after fall. EXAM: RIGHT KNEE - COMPLETE 4+ VIEW COMPARISON:  02/03/2023 FINDINGS: No acute fracture or dislocation. Chronic medial tibiofemoral joint space narrowing. Chronic peripheral spurring. Unchanged small ossific density adjacent to the medial femur. No knee joint effusion. Generalized soft tissue edema. IMPRESSION: 1. No acute fracture or dislocation of the right knee. 2. Chronic medial tibiofemoral osteoarthritis. Electronically Signed   By: Narda Rutherford M.D.   On: 08/22/2023 23:42   DG Chest 2 View  Result Date: 08/22/2023 CLINICAL DATA:  Exertional shortness of breath EXAM: CHEST - 2 VIEW COMPARISON:  X-ray and CT angiogram 08/15/2023 FINDINGS: Interval slight decrease in patchy bilateral lung opacities with significant residual along the lung bases. Stable enlarged cardiopericardial silhouette with prominent central vasculature. No pneumothorax or effusion. Frontal view is kyphotic limiting evaluation of the apices. Mild degenerative changes of the spine. IMPRESSION: Decreasing lung opacities with some residual. Recommend continued follow-up. Electronically Signed   By: Karen Kays M.D.   On: 08/22/2023 13:38    Procedures Procedures    Medications Ordered in ED Medications  acetaminophen (TYLENOL) tablet 650 mg (has no administration in time range)    ED Course/ Medical Decision Making/ A&P                                 Medical  Decision Making Amount and/or Complexity of Data Reviewed Labs: ordered. Radiology: ordered.  Risk OTC drugs.   57 year old female presents today for concern of bilateral knee pain after a fall that occurred as she was heading to the bus stop after recently being discharged from the hospital.  Bilateral knee x-rays obtained.  No acute finding.  Soft tissue edema and arthritis noted.  Likely chronic findings.  Will give dose of Tylenol.  Will discharge.  She is in agreement with this plan.  0815: Prior to patient being officially discharged by nursing staff patient endorsed suicidal ideation.  She points to the cords in the room in terms of her plan.  She also endorses auditory hallucinations.  She states that her sister's birthday was [birth year] that she has passed away.  She states she is hearing  her voices tell her that she needs to joint her.  She was recently admitted to behavioral health urgent care for an inpatient psych stay.  Currently not on medications.  Will obtain medical clearance lab for psych eval.  Will place TTS consult.  CBC without leukocytosis.  Hemoglobin at 9.3 which is around her baseline.  Glucose of 306, creatinine 1.05 otherwise CMP without acute findings.  Acetaminophen, salicylate, ethanol levels within normal.  UDS pending.  Patient cleared medically for psych eval.  I spoke with the psychiatry NP who will evaluate patient and provide additional recommendations.  UDS positive for cocaine.  At this time patient is voluntary.  No indication for IVC at this time.  Final Clinical Impression(s) / ED Diagnoses Final diagnoses:  Fall, initial encounter  Pain in both knees, unspecified chronicity    Rx / DC Orders ED Discharge Orders     None         Marita Kansas, PA-C 08/23/23 1208    Marita Kansas, PA-C 08/23/23 1215    Alvira Monday, MD 08/23/23 2201

## 2023-08-23 NOTE — ED Notes (Signed)
Care assumed, pt calm at this time, sitter at bedside.

## 2023-08-23 NOTE — Progress Notes (Signed)
Hailey Klein,  The biopsies taken from your stomach were notable for mild chronic gastritis (inflammation) which is a common finding, but there was no evidence of Helicobacter pylori infection. The ulcers and gastritis are most likely caused by medications (NSAIDs or aspirin).  Please continue to take the pantoprazole twice a day for 8 weeks, and then once a day after that.  Please avoid over the counter pain medications other than Tylenol.  Please follow up with Dr. Barron Alvine in our office to discuss repeat EGD to assess healing of the ulcers.  [name],  Can you relay the following message and schedule an office visit with Dr. Barron Alvine?

## 2023-08-24 LAB — RESP PANEL BY RT-PCR (RSV, FLU A&B, COVID)  RVPGX2
Influenza A by PCR: NEGATIVE
Influenza B by PCR: NEGATIVE
Resp Syncytial Virus by PCR: NEGATIVE
SARS Coronavirus 2 by RT PCR: NEGATIVE

## 2023-08-24 LAB — CBG MONITORING, ED: Glucose-Capillary: 370 mg/dL — ABNORMAL HIGH (ref 70–99)

## 2023-08-24 MED ORDER — INSULIN ASPART 100 UNIT/ML IJ SOLN
0.0000 [IU] | Freq: Three times a day (TID) | INTRAMUSCULAR | Status: DC
  Administered 2023-08-24: 20 [IU] via SUBCUTANEOUS

## 2023-08-24 MED ORDER — INSULIN GLARGINE-YFGN 100 UNIT/ML ~~LOC~~ SOLN
15.0000 [IU] | Freq: Every day | SUBCUTANEOUS | Status: DC
  Administered 2023-08-24: 15 [IU] via SUBCUTANEOUS
  Filled 2023-08-24: qty 0.15

## 2023-08-24 NOTE — ED Notes (Signed)
Report called to Anastasio Champion at [facility] at [phone].

## 2023-08-24 NOTE — Inpatient Diabetes Management (Signed)
Inpatient Diabetes Program Recommendations  AACE/ADA: New Consensus Statement on Inpatient Glycemic Control (2015)  Target Ranges:  Prepandial:   less than 140 mg/dL      Peak postprandial:   less than 180 mg/dL (1-2 hours)      Critically ill patients:  140 - 180 mg/dL   Lab Results  Component Value Date   GLUCAP 314 (H) 08/22/2023   HGBA1C 11.0 (H) 07/30/2023    Review of Glycemic Control  Latest Reference Range & Units 08/22/23 10:47  Glucose-Capillary 70 - 99 mg/dL 016 (H)  (H): Data is abnormally high Diabetes history: Type 2 DM Outpatient Diabetes medications: Lantus 28 units at bedtime, Bydureon 2 mg qwk Current orders for Inpatient glycemic control:  None  Inpatient Diabetes Program Recommendations:    Consider adding Semglee 20 units at bedtime and Novolog 0-9 units TID & HS.   Thanks, Lujean Rave, MSN, RNC-OB Diabetes Coordinator [phone] (8a-5p)

## 2023-08-24 NOTE — Progress Notes (Addendum)
LCSW Progress Note  [id number]   Hailey Klein  08/24/2023  12:20 AM    Inpatient Behavioral Health Placement  Pt meets inpatient criteria per Arsenio Loader, NP. There are no available beds within [facility] per [facility] Kelly Southard,RN. Referral was sent to the following facilities;   Destination  Service Provider Address Phone [facility]  [address] [place], [place] [address] [phone]  [facility]  [address] [place] [address] [phone]  [facility]  [address] [place] [address] [phone]  [facility]  [place] [address] [phone]  [facility]  [address] [place] [address] [phone]  [facility] [address]  [address], [place] [address] [phone]  [facility]  [address] [place], [place] [address] [phone]  [facility]  [address], [place] [address] [phone]  [facility]  [address] [place] [address] [phone]  [facility]  [address] [place], [place] [address] [phone]  [facility]  [address] [place] [address] [phone]  [facility]  [address] [place] [address] [phone]  [facility]  [address] [place] [address] [phone]  [facility]  [address], [place] [address] [phone]  [facility]  [address] [place] [address] [phone]  [facility]  [address] [place] [address] [phone]  [facility]  [address] [place], [place] [phone]  [facility]  [address], [place] [address] [phone]  [facility]  [address], [place] [address] [phone]  [facility]  [address], [place] [address] [phone]  [facility]  [address] [place] [address] [phone]  [facility]  [address] [place] [address] [phone]  [facility]  [place] [phone]  [facility]  [address] [place] [address] [phone]  [facility]  [place] [phone]  [facility]  [address] [place] [address] [phone]  [facility]  [address], [place] [address] [phone]    Situation ongoing,  CSW will follow up.    Maryjean Ka, MSW, LCSWA 08/24/2023 12:20 AM

## 2023-08-24 NOTE — Progress Notes (Addendum)
Pt was accepted to [facility] TODAY10/09/2023; Bed Assignment Erlene Quan PENDING Negative COVID faxed to Yvetta Coder [phone]  Pt meets inpatient criteria per Shearon Stalls  Attending Physician will be Dr. Alexis Goodell MD  Report can be called to: - [phone]  Pt can arrive after : 9:00am once PENDING item is confirmed.  Care Team notified: Ernie Hew, LCSW   Kelton Pillar, LCSWA 08/24/2023 @ 12:37 AM

## 2023-08-24 NOTE — ED Notes (Signed)
[employer] contacted to arrange transportation for the patient.  ETA: within the hour.

## 2023-08-24 NOTE — ED Notes (Signed)
Faxed covid result to [facility].

## 2023-08-24 NOTE — ED Notes (Signed)
Call from Tiburcio Bash at [facility], report given, he requested updated covid test. Will order.

## 2023-08-24 NOTE — Progress Notes (Signed)
CSW received a phone call from [facility] advising pt is under review. [facility] Intake requested to speak with nursing and CSW provided assigned nurse's information. CSW notified assigned nurse Felicity Pellegrini. CSW/ Disposition team will assist and follow with placement.    Maryjean Ka, MSW, [facility] 08/24/2023 12:36 AM

## 2023-08-24 NOTE — ED Provider Notes (Signed)
Emergency Medicine Observation Re-evaluation Note  Hailey Klein is a 57 y.o. female, seen on rounds today.  Pt initially presented to the ED for complaints of Fall Currently, the patient is sleeping.  Physical Exam  BP (!) 145/64 (BP Location: Left Arm)   Pulse 72   Temp 98.3 F (36.8 C) (Oral)   Resp 19   Ht 5\' 3"  (1.6 m)   Wt 136 kg   LMP 07/21/2015   SpO2 98%   BMI 53.11 kg/m  Physical Exam General: sleeping Cardiac: regular Lungs: no distress Psych: sleeping  ED Course / MDM  EKG:   I have reviewed the labs performed to date as well as medications administered while in observation.  Recent changes in the last 24 hours include none.  Plan  Current plan is for pt to go to [facility] today.  Waiting on bed assignment.    Gwyneth Sprout, MD 08/24/23 0830

## 2023-08-24 NOTE — ED Notes (Signed)
Contacted lab re: covid result, they state it is on the machine currently running.

## 2023-08-29 NOTE — ED Provider Notes (Signed)
[facility] AT [facility] Provider Note  CSN: [id number] Arrival date & time: 08/20/23 2219  Chief Complaint(s) Generalized Body Aches  HPI Hailey Klein is a 57 y.o. female with PMH CKD, T2DM, depression with previous suicide attempt, HTN, PTSD, polysubstance use, PUD who presents emergency room for evaluation of generalized bodyaches, headache, abdominal pain.  Was seen yesterday with extensive CT imaging that was reassuringly negative.  Arrives with multiple complaints today including headache, abdominal pain, back pain   Past Medical History Past Medical History:  Diagnosis Date   Chronic kidney disease    Community acquired pneumonia 08/16/2023   Diabetes mellitus without complication (HCC)    Dyspnea    H/O suicide attempt    cut wrists - 19-y-o   High cholesterol    Hypertension    Hypokalemia 02/02/2023   MDD (major depressive disorder), recurrent episode, severe (HCC) 08/03/2023   Neuropathy    Pneumonia 08/15/2023   PTSD (post-traumatic stress disorder)    Patient Active Problem List   Diagnosis Date Noted   Cocaine use disorder, severe, dependence (HCC) 08/23/2023   Cannabis use disorder, severe, dependence (HCC) 08/23/2023   Gastric ulcer with hemorrhage 08/19/2023   Melena 08/17/2023   Acute blood loss anemia 08/17/2023   Heme positive stool 08/17/2023   Dyspepsia 08/01/2023   Migraines 08/01/2023   Normocytic anemia 08/01/2023   Moderate protein malnutrition (HCC) 08/01/2023   GAD (generalized anxiety disorder) 07/31/2023   Polysubstance abuse (HCC) 07/30/2023   Suicidal ideation 07/30/2023   Chest pain at rest 02/01/2023   Necrotizing soft tissue infection 08/30/2022   Renal vein thrombosis (HCC) 06/23/2022   Acute on chronic diastolic CHF (congestive heart failure) (HCC) 06/20/2022   AKI (acute kidney injury) (HCC) 06/20/2022   Essential hypertension 06/20/2022   Type 2 diabetes mellitus with hyperlipidemia (HCC)  06/20/2022   Acute gout 06/19/2022   Restrictive lung disease secondary to obesity 03/22/2020   Acute venous embolism and thrombosis of deep vessels of proximal lower extremity (HCC) 09/10/2019   Diabetic polyneuropathy associated with type 2 diabetes mellitus (HCC) 09/10/2018   Class 3 severe obesity due to excess calories without serious comorbidity in adult (HCC) 05/03/2018   Tobacco abuse 01/08/2018   Chronic coronary artery disease 07/13/2017   NSTEMI (non-ST elevated myocardial infarction) (HCC) 04/30/2017   Alcohol use disorder, moderate, in sustained remission (HCC) 12/02/2015   Cocaine abuse (HCC) 12/02/2015   Opioid use disorder, moderate, in sustained remission (HCC) 12/02/2015   PTSD (post-traumatic stress disorder) 12/01/2015   Major depression, recurrent, chronic (HCC) 12/01/2015   MDD (major depressive disorder), recurrent, severe, with psychosis (HCC)    Malingering 07/05/2015   Home Medication(s) Prior to Admission medications   Medication Sig Start Date End Date Taking? Authorizing Provider  albuterol (VENTOLIN HFA) 108 (90 Base) MCG/ACT inhaler Inhale 1-2 puffs into the lungs every 6 (six) hours as needed for wheezing or shortness of breath.    [provider]  amLODipine (NORVASC) 10 MG tablet Take 1 tablet (10 mg total) by mouth daily. 08/10/23 09/09/23  Karie Fetch, MD  ARIPiprazole (ABILIFY) 10 MG tablet Take 1 tablet (10 mg total) by mouth daily. 08/10/23 09/09/23  Karie Fetch, MD  aspirin EC 81 MG tablet Take 1 tablet (81 mg total) by mouth daily. Swallow whole. 09/03/23   Azucena Fallen, MD  atorvastatin (LIPITOR) 80 MG tablet Take 1 tablet (80 mg total) by mouth daily. 08/10/23 09/09/23  Karie Fetch, MD  BYDUREON BCISE 2  MG/0.85ML AUIJ Inject 2 mg into the skin every Tuesday. 05/13/22   [provider]  cyanocobalamin (VITAMIN B12) 500 MCG tablet Take 1 tablet (500 mcg total) by mouth daily. 08/03/23   Hongalgi, Maximino Greenland, MD  folic  acid (FOLVITE) 1 MG tablet Take 1 tablet (1 mg total) by mouth daily. 08/03/23   Hongalgi, Maximino Greenland, MD  LANTUS SOLOSTAR 100 UNIT/ML Solostar Pen Inject 28 Units into the skin at bedtime. 05/13/22   [provider]  losartan (COZAAR) 100 MG tablet Take 1 tablet (100 mg total) by mouth daily. 08/10/23 09/09/23  Karie Fetch, MD  montelukast (SINGULAIR) 10 MG tablet Take 10 mg by mouth at bedtime. 05/13/22   [provider]  pantoprazole (PROTONIX) 40 MG tablet Take 1 tablet (40 mg total) by mouth 2 (two) times daily. 08/19/23   Azucena Fallen, MD  potassium chloride (KLOR-CON M) 10 MEQ tablet Take 1 tablet (10 mEq total) by mouth daily. 08/10/23 09/09/23  Karie Fetch, MD  pregabalin (LYRICA) 100 MG capsule Take 1 capsule (100 mg total) by mouth 2 (two) times daily. 08/10/23 09/09/23  Karie Fetch, MD  SYMBICORT 160-4.5 MCG/ACT inhaler Inhale 2 puffs into the lungs 2 (two) times daily. 05/13/22   [provider]  thiamine (VITAMIN B1) 100 MG tablet Take 100 mg by mouth 2 (two) times daily. 05/13/22   [provider]  torsemide (DEMADEX) 20 MG tablet Take 1 tablet (20 mg total) by mouth daily. 08/19/23 09/18/23  Azucena Fallen, MD                                                                                                                                    Past Surgical History Past Surgical History:  Procedure Laterality Date   BIOPSY  08/19/2023   Procedure: BIOPSY;  Surgeon: Jenel Lucks, MD;  Location: [facility] ENDOSCOPY;  Service: Gastroenterology;;   ESOPHAGOGASTRODUODENOSCOPY (EGD) WITH PROPOFOL N/A 08/19/2023   Procedure: ESOPHAGOGASTRODUODENOSCOPY (EGD) WITH PROPOFOL;  Surgeon: Jenel Lucks, MD;  Location: [facility] ENDOSCOPY;  Service: Gastroenterology;  Laterality: N/A;   LEFT HEART CATH AND CORONARY ANGIOGRAPHY N/A 02/02/2023   Procedure: LEFT HEART CATH AND CORONARY ANGIOGRAPHY;  Surgeon: Tonny Bollman, MD;  Location: [facility] INVASIVE CV LAB;   Service: Cardiovascular;  Laterality: N/A;   Family History Family History  Problem Relation Age of Onset   Alcoholism Other     Social History Social History   Tobacco Use   Smoking status: Every Day    Current packs/day: 1.00    Average packs/day: 1 pack/day for 0.8 years (0.8 ttl pk-yrs)    Types: Cigarettes    Start date: 2024  Vaping Use   Vaping status: Never Used  Substance Use Topics   Alcohol use: Not Currently   Drug use: Yes    Types: Marijuana, Cocaine    Comment: using cocaine ''   Allergies Aspirin  Review of  Systems Review of Systems  Constitutional:  Positive for fatigue.  Gastrointestinal:  Positive for abdominal pain.  Genitourinary:  Positive for flank pain.  Musculoskeletal:  Positive for back pain.  Neurological:  Positive for headaches.    Physical Exam Vital Signs  I have reviewed the triage vital signs BP (!) 181/96   Pulse 85   Temp 98.4 F (36.9 C)   Resp 18   LMP 07/21/2015   SpO2 98%   Physical Exam Vitals and nursing note reviewed.  Constitutional:      General: She is not in acute distress.    Appearance: She is well-developed.  HENT:     Head: Normocephalic and atraumatic.  Eyes:     Conjunctiva/sclera: Conjunctivae normal.  Cardiovascular:     Rate and Rhythm: Normal rate and regular rhythm.     Heart sounds: No murmur heard. Pulmonary:     Effort: Pulmonary effort is normal. No respiratory distress.     Breath sounds: Normal breath sounds.  Abdominal:     Palpations: Abdomen is soft.     Tenderness: There is no abdominal tenderness.  Musculoskeletal:        General: No swelling.     Cervical back: Neck supple.  Skin:    General: Skin is warm and dry.     Capillary Refill: Capillary refill takes less than 2 seconds.  Neurological:     General: No focal deficit present.     Mental Status: She is alert.     Cranial Nerves: No cranial nerve deficit.     Sensory: No sensory deficit.     Motor: No weakness.   Psychiatric:        Mood and Affect: Mood normal.     ED Results and Treatments Labs (all labs ordered are listed, but only abnormal results are displayed) Labs Reviewed  COMPREHENSIVE METABOLIC PANEL - Abnormal; Notable for the following components:      Result Value   Sodium 133 (*)    Potassium 3.2 (*)    CO2 21 (*)    Glucose, Bld 397 (*)    BUN 30 (*)    Creatinine, Ser 1.48 (*)    Calcium 8.3 (*)    Albumin 3.3 (*)    GFR, Estimated 42 (*)    All other components within normal limits  LIPASE, BLOOD - Abnormal; Notable for the following components:   Lipase 64 (*)    All other components within normal limits  CBC - Abnormal; Notable for the following components:   RBC 3.12 (*)    Hemoglobin 9.2 (*)    HCT 28.2 (*)    All other components within normal limits  URINALYSIS, ROUTINE W REFLEX MICROSCOPIC - Abnormal; Notable for the following components:   Glucose, UA >=500 (*)    Hgb urine dipstick SMALL (*)    Protein, ur >=300 (*)    Bacteria, UA RARE (*)    All other components within normal limits  CBG MONITORING, ED - Abnormal; Notable for the following components:   Glucose-Capillary 374 (*)    All other components within normal limits  CBG MONITORING, ED - Abnormal; Notable for the following components:   Glucose-Capillary 384 (*)    All other components within normal limits  SARS CORONAVIRUS 2 BY RT PCR  LACTIC ACID, PLASMA  BRAIN NATRIURETIC PEPTIDE  Radiology No results found.  Pertinent labs & imaging results that were available during my care of the patient were reviewed by me and considered in my medical decision making (see MDM for details).  Medications Ordered in ED Medications  potassium chloride SA (KLOR-CON M) CR tablet 40 mEq (40 mEq Oral Given 08/21/23 0805)  magnesium oxide (MAG-OX) tablet 800 mg (800 mg Oral Given 08/21/23  0805)  insulin aspart (novoLOG) injection 10 Units (10 Units Intravenous Given 08/21/23 0812)  prochlorperazine (COMPAZINE) injection 10 mg (10 mg Intravenous Given 08/21/23 0800)  diphenhydrAMINE (BENADRYL) injection 25 mg (25 mg Intravenous Given 08/21/23 0803)  ketorolac (TORADOL) 15 MG/ML injection 15 mg (15 mg Intravenous Given 08/21/23 1309)  hydrALAZINE (APRESOLINE) injection 5 mg (5 mg Intravenous Given 08/21/23 1312)                                                                                                                                     Procedures Procedures  (including critical care time)  Medical Decision Making / ED Course   This patient presents to the ED for concern of headache, back pain, flank pain, abdominal pain, this involves an extensive number of treatment options, and is a complaint that carries with it a high risk of complications and morbidity.  The differential diagnosis includes electrolyte abnormality, dehydration, viral URI, substance withdrawal, UTI, pyelonephritis  MDM: Patient seen emergency room for evaluation of multiple complaints described above.  Physical exam largely unremarkable with a benign abdominal exam, no significant CVA tenderness, no focal motor or sensory deficits.  Laboratory evaluation with a sodium of 133, potassium 3.2, glucose 397 but no anion gap, BUN 30, creatinine 1.48.  COVID-negative.  Lipase minimally elevated at 64, hemoglobin 9.2 which is patient's baseline.  Urinalysis without evidence of infection.  Electrolytes repleted and patient given a headache cocktail and on reevaluation symptoms improved.  She had extensive CT imaging done yesterday and I do not feel that repeat imaging is warranted at this time.  With symptoms improved and vital signs stable she does not meet inpatient criteria for admission she is safe for discharge with outpatient follow-up.  Of note, patient is hypertensive but not showing evidence of endorgan damage at  this time.  Patient discharged   Additional history obtained:  -External records from outside source obtained and reviewed including: Chart review including previous notes, labs, imaging, consultation notes   Lab Tests: -I ordered, reviewed, and interpreted labs.   The pertinent results include:   Labs Reviewed  COMPREHENSIVE METABOLIC PANEL - Abnormal; Notable for the following components:      Result Value   Sodium 133 (*)    Potassium 3.2 (*)    CO2 21 (*)    Glucose, Bld 397 (*)    BUN 30 (*)    Creatinine, Ser 1.48 (*)    Calcium 8.3 (*)    Albumin 3.3 (*)  GFR, Estimated 42 (*)    All other components within normal limits  LIPASE, BLOOD - Abnormal; Notable for the following components:   Lipase 64 (*)    All other components within normal limits  CBC - Abnormal; Notable for the following components:   RBC 3.12 (*)    Hemoglobin 9.2 (*)    HCT 28.2 (*)    All other components within normal limits  URINALYSIS, ROUTINE W REFLEX MICROSCOPIC - Abnormal; Notable for the following components:   Glucose, UA >=500 (*)    Hgb urine dipstick SMALL (*)    Protein, ur >=300 (*)    Bacteria, UA RARE (*)    All other components within normal limits  CBG MONITORING, ED - Abnormal; Notable for the following components:   Glucose-Capillary 374 (*)    All other components within normal limits  CBG MONITORING, ED - Abnormal; Notable for the following components:   Glucose-Capillary 384 (*)    All other components within normal limits  SARS CORONAVIRUS 2 BY RT PCR  LACTIC ACID, PLASMA  BRAIN NATRIURETIC PEPTIDE        Medicines ordered and prescription drug management: Meds ordered this encounter  Medications   potassium chloride SA (KLOR-CON M) CR tablet 40 mEq   magnesium oxide (MAG-OX) tablet 800 mg   insulin aspart (novoLOG) injection 10 Units   prochlorperazine (COMPAZINE) injection 10 mg   diphenhydrAMINE (BENADRYL) injection 25 mg   ketorolac (TORADOL) 15 MG/ML  injection 15 mg   hydrALAZINE (APRESOLINE) injection 5 mg    -I have reviewed the patients home medicines and have made adjustments as needed  Critical interventions none    Cardiac Monitoring: The patient was maintained on a cardiac monitor.  I personally viewed and interpreted the cardiac monitored which showed an underlying rhythm of: NSR  Social Determinants of Health:  Factors impacting patients care include: Multiple ER visits   Reevaluation: After the interventions noted above, I reevaluated the patient and found that they have :improved  Co morbidities that complicate the patient evaluation  Past Medical History:  Diagnosis Date   Chronic kidney disease    Community acquired pneumonia 08/16/2023   Diabetes mellitus without complication (HCC)    Dyspnea    H/O suicide attempt    cut wrists - 19-y-o   High cholesterol    Hypertension    Hypokalemia 02/02/2023   MDD (major depressive disorder), recurrent episode, severe (HCC) 08/03/2023   Neuropathy    Pneumonia 08/15/2023   PTSD (post-traumatic stress disorder)       Dispostion: I considered admission for this patient, but at this time she does not meet inpatient criteria for admission she is safe for discharge with outpatient follow-up     Final Clinical Impression(s) / ED Diagnoses Final diagnoses:  Acute nonintractable headache, unspecified headache type  Dehydration     @PCDICTATION @    Milta Croson, Wyn Forster, MD 08/29/23 1200

## 2023-08-30 ENCOUNTER — Ambulatory Visit: Payer: Medicaid Other | Admitting: Student

## 2023-08-30 NOTE — Progress Notes (Deleted)
New Patient Office Visit  Subjective    Patient ID: Hailey Klein, female    DOB: [birth year]  Age: 57 y.o. MRN: [id number]  CC: No chief complaint on file.   HPI Hailey Klein presents to establish care  MHx is significant for HFpEF, CKD, renal vein thrombus on Eliquis , T2DM, HLD, HTN, and neuropathy.   Past medical history: Heart failure with preserved ejection fraction CKD Renal vein thrombosis on Eliquis Type 2 diabetes Hyperlipidemia Hypertension Neuropathy  Past psych history: Depression Anxiety PTSD Previous suicide attempts   Outpatient Encounter Medications as of 08/30/2023  Medication Sig   albuterol (VENTOLIN HFA) 108 (90 Base) MCG/ACT inhaler Inhale 1-2 puffs into the lungs every 6 (six) hours as needed for wheezing or shortness of breath.   amLODipine (NORVASC) 10 MG tablet Take 1 tablet (10 mg total) by mouth daily.   ARIPiprazole (ABILIFY) 10 MG tablet Take 1 tablet (10 mg total) by mouth daily.   [START ON 09/03/2023] aspirin EC 81 MG tablet Take 1 tablet (81 mg total) by mouth daily. Swallow whole.   atorvastatin (LIPITOR) 80 MG tablet Take 1 tablet (80 mg total) by mouth daily.   BYDUREON BCISE 2 MG/0.85ML AUIJ Inject 2 mg into the skin every Tuesday.   cyanocobalamin (VITAMIN B12) 500 MCG tablet Take 1 tablet (500 mcg total) by mouth daily.   folic acid (FOLVITE) 1 MG tablet Take 1 tablet (1 mg total) by mouth daily.   LANTUS SOLOSTAR 100 UNIT/ML Solostar Pen Inject 28 Units into the skin at bedtime.   losartan (COZAAR) 100 MG tablet Take 1 tablet (100 mg total) by mouth daily.   montelukast (SINGULAIR) 10 MG tablet Take 10 mg by mouth at bedtime.   pantoprazole (PROTONIX) 40 MG tablet Take 1 tablet (40 mg total) by mouth 2 (two) times daily.   potassium chloride (KLOR-CON M) 10 MEQ tablet Take 1 tablet (10 mEq total) by mouth daily.   pregabalin (LYRICA) 100 MG capsule Take 1 capsule (100 mg total) by mouth 2 (two) times daily.   SYMBICORT  160-4.5 MCG/ACT inhaler Inhale 2 puffs into the lungs 2 (two) times daily.   thiamine (VITAMIN B1) 100 MG tablet Take 100 mg by mouth 2 (two) times daily.   torsemide (DEMADEX) 20 MG tablet Take 1 tablet (20 mg total) by mouth daily.   No facility-administered encounter medications on file as of 08/30/2023.    Past Medical History:  Diagnosis Date   Chronic kidney disease    Community acquired pneumonia 08/16/2023   Diabetes mellitus without complication (HCC)    Dyspnea    H/O suicide attempt    cut wrists - 19-y-o   High cholesterol    Hypertension    Hypokalemia 02/02/2023   MDD (major depressive disorder), recurrent episode, severe (HCC) 08/03/2023   Neuropathy    Pneumonia 08/15/2023   PTSD (post-traumatic stress disorder)     Past Surgical History:  Procedure Laterality Date   BIOPSY  08/19/2023   Procedure: BIOPSY;  Surgeon: Jenel Lucks, MD;  Location[facility] ENDOSCOPY;  Service: Gastroenterology;;   ESOPHAGOGASTRODUODENOSCOPY (EGD) WITH PROPOFOL N/A 08/19/2023   Procedure: ESOPHAGOGASTRODUODENOSCOPY (EGD) WITH PROPOFOL;  Surgeon: Jenel Lucks, MD;  Location: [facility] ENDOSCOPY;  Service: Gastroenterology;  Laterality: N/A;   LEFT HEART CATH AND CORONARY ANGIOGRAPHY N/A 02/02/2023   Procedure: LEFT HEART CATH AND CORONARY ANGIOGRAPHY;  Surgeon: Tonny Bollman, MD;  Location: [facility] INVASIVE CV LAB;  Service: Cardiovascular;  Laterality: N/A;    Family  History  Problem Relation Age of Onset   Alcoholism Other     Social History   Socioeconomic History   Marital status: Single    Spouse name: Not on file   Number of children: 0   Years of education: Not on file   Highest education level: High school graduate  Occupational History   Occupation: Disabilty  Tobacco Use   Smoking status: Every Day    Current packs/day: 1.00    Average packs/day: 1 pack/day for 0.8 years (0.8 ttl pk-yrs)    Types: Cigarettes    Start date: 2024   Smokeless tobacco: Not on file   Vaping Use   Vaping status: Never Used  Substance and Sexual Activity   Alcohol use: Not Currently   Drug use: Yes    Types: Marijuana, Cocaine    Comment: using cocaine ''   Sexual activity: Yes  Other Topics Concern   Not on file  Social History Narrative   Not on file   Social Determinants of Health   Financial Resource Strain: Medium Risk (07/11/2022)   Overall Financial Resource Strain (CARDIA)    Difficulty of Paying Living Expenses: Somewhat hard  Food Insecurity: No Food Insecurity (08/15/2023)   Hunger Vital Sign    Worried About Running Out of Food in the Last Year: Never true    Ran Out of Food in the Last Year: Never true  Recent Concern: Food Insecurity - Food Insecurity Present (08/01/2023)   Hunger Vital Sign    Worried About Running Out of Food in the Last Year: Sometimes true    Ran Out of Food in the Last Year: Sometimes true  Transportation Needs: No Transportation Needs (08/15/2023)   PRAPARE - Administrator, Civil Service (Medical): No    Lack of Transportation (Non-Medical): No  Recent Concern: Transportation Needs - Unmet Transportation Needs (08/01/2023)   PRAPARE - Administrator, Civil Service (Medical): Yes    Lack of Transportation (Non-Medical): Yes  Physical Activity: Not on file  Stress: Not on file  Social Connections: Not on file  Intimate Partner Violence: Not At Risk (08/15/2023)   Humiliation, Afraid, Rape, and Kick questionnaire    Fear of Current or Ex-Partner: No    Emotionally Abused: No    Physically Abused: No    Sexually Abused: No    ROS      Objective    LMP 07/21/2015   Physical Exam  {Labs (Optional):23779}    Assessment & Plan:  Establish Care past psychiatric history of depression, anxiety, PTSD, and substance abuse (Cocaine, Hx of EtOH), and 4 Suicide Attempts (last- [facility] 2016) and 3 Prior Psychiatric Hospitalizations. Patient was recently discharged from [facility] on 9/18 and transferred  to [facility], admitted to the medical floor (9/18-9/20) due to elevated BUN and Scr, with concerns of an acute kidney injury. She has now been re-admitted to [facility] on 08/03/2023 for management of worsening depression in setting of auditory hallucinations. P   Problem List Items Addressed This Visit   None   No follow-ups on file.   Jeral Pinch, DO

## 2023-09-11 ENCOUNTER — Ambulatory Visit: Payer: Medicaid Other | Admitting: Gastroenterology
